# Patient Record
Sex: Male | Born: 1965
Health system: Southern US, Community
[De-identification: ages and names within clinical notes are randomized; demographics above are authoritative.]

## PROBLEM LIST (undated history)

## (undated) DIAGNOSIS — I2510 Atherosclerotic heart disease of native coronary artery without angina pectoris: Secondary | ICD-10-CM

## (undated) DIAGNOSIS — K219 Gastro-esophageal reflux disease without esophagitis: Secondary | ICD-10-CM

## (undated) DIAGNOSIS — G43909 Migraine, unspecified, not intractable, without status migrainosus: Secondary | ICD-10-CM

## (undated) DIAGNOSIS — D496 Neoplasm of unspecified behavior of brain: Secondary | ICD-10-CM

## (undated) DIAGNOSIS — F32A Depression, unspecified: Secondary | ICD-10-CM

## (undated) DIAGNOSIS — E8881 Metabolic syndrome: Secondary | ICD-10-CM

## (undated) DIAGNOSIS — E785 Hyperlipidemia, unspecified: Secondary | ICD-10-CM

## (undated) DIAGNOSIS — I255 Ischemic cardiomyopathy: Secondary | ICD-10-CM

## (undated) DIAGNOSIS — G4730 Sleep apnea, unspecified: Secondary | ICD-10-CM

## (undated) DIAGNOSIS — Z720 Tobacco use: Secondary | ICD-10-CM

## (undated) DIAGNOSIS — R569 Unspecified convulsions: Secondary | ICD-10-CM

## (undated) DIAGNOSIS — F419 Anxiety disorder, unspecified: Secondary | ICD-10-CM

## (undated) DIAGNOSIS — N200 Calculus of kidney: Secondary | ICD-10-CM

## (undated) DIAGNOSIS — C801 Malignant (primary) neoplasm, unspecified: Secondary | ICD-10-CM

## (undated) DIAGNOSIS — F329 Major depressive disorder, single episode, unspecified: Secondary | ICD-10-CM

## (undated) DIAGNOSIS — R7303 Prediabetes: Secondary | ICD-10-CM

## (undated) DIAGNOSIS — M1990 Unspecified osteoarthritis, unspecified site: Secondary | ICD-10-CM

## (undated) HISTORY — DX: Prediabetes: R73.03

## (undated) HISTORY — DX: Unspecified convulsions: R56.9

## (undated) HISTORY — PX: TOOTH EXTRACTION: SUR596

## (undated) HISTORY — PX: KNEE ARTHROSCOPY: SUR90

## (undated) HISTORY — PX: MOHS SURGERY: SUR867

## (undated) HISTORY — PX: WISDOM TOOTH EXTRACTION: SHX21

---

## 2002-11-05 HISTORY — PX: BRAIN SURGERY: SHX531

## 2003-09-24 ENCOUNTER — Ambulatory Visit (HOSPITAL_COMMUNITY): Admission: RE | Admit: 2003-09-24 | Discharge: 2003-09-24 | Payer: Self-pay | Admitting: Neurology

## 2003-10-05 ENCOUNTER — Inpatient Hospital Stay (HOSPITAL_COMMUNITY): Admission: RE | Admit: 2003-10-05 | Discharge: 2003-10-10 | Payer: Self-pay | Admitting: Neurological Surgery

## 2003-10-05 ENCOUNTER — Encounter (INDEPENDENT_AMBULATORY_CARE_PROVIDER_SITE_OTHER): Payer: Self-pay | Admitting: Specialist

## 2003-11-17 ENCOUNTER — Ambulatory Visit (HOSPITAL_COMMUNITY): Admission: RE | Admit: 2003-11-17 | Discharge: 2003-11-17 | Payer: Self-pay | Admitting: Neurological Surgery

## 2004-02-15 ENCOUNTER — Ambulatory Visit (HOSPITAL_COMMUNITY): Admission: RE | Admit: 2004-02-15 | Discharge: 2004-02-15 | Payer: Self-pay | Admitting: Neurological Surgery

## 2004-03-01 ENCOUNTER — Ambulatory Visit (HOSPITAL_COMMUNITY): Admission: RE | Admit: 2004-03-01 | Discharge: 2004-03-01 | Payer: Self-pay | Admitting: Neurology

## 2004-05-29 ENCOUNTER — Ambulatory Visit (HOSPITAL_COMMUNITY): Admission: RE | Admit: 2004-05-29 | Discharge: 2004-05-29 | Payer: Self-pay | Admitting: Neurology

## 2004-06-16 ENCOUNTER — Encounter: Admission: RE | Admit: 2004-06-16 | Discharge: 2004-09-14 | Payer: Self-pay | Admitting: Psychology

## 2004-07-09 ENCOUNTER — Ambulatory Visit (HOSPITAL_COMMUNITY): Admission: RE | Admit: 2004-07-09 | Discharge: 2004-07-09 | Payer: Self-pay | Admitting: Family Medicine

## 2005-03-14 ENCOUNTER — Ambulatory Visit (HOSPITAL_COMMUNITY): Admission: RE | Admit: 2005-03-14 | Discharge: 2005-03-14 | Payer: Self-pay | Admitting: Neurology

## 2005-05-28 ENCOUNTER — Ambulatory Visit (HOSPITAL_BASED_OUTPATIENT_CLINIC_OR_DEPARTMENT_OTHER): Admission: RE | Admit: 2005-05-28 | Discharge: 2005-05-28 | Payer: Self-pay | Admitting: Neurology

## 2005-06-03 ENCOUNTER — Ambulatory Visit: Payer: Self-pay | Admitting: Internal Medicine

## 2005-07-05 ENCOUNTER — Ambulatory Visit: Payer: Self-pay | Admitting: Internal Medicine

## 2005-08-02 ENCOUNTER — Ambulatory Visit: Payer: Self-pay | Admitting: Internal Medicine

## 2005-08-23 ENCOUNTER — Ambulatory Visit (HOSPITAL_COMMUNITY): Admission: RE | Admit: 2005-08-23 | Discharge: 2005-08-23 | Payer: Self-pay | Admitting: Neurology

## 2005-11-22 ENCOUNTER — Ambulatory Visit: Payer: Self-pay | Admitting: Internal Medicine

## 2007-11-06 DIAGNOSIS — G4730 Sleep apnea, unspecified: Secondary | ICD-10-CM

## 2007-11-06 HISTORY — DX: Sleep apnea, unspecified: G47.30

## 2008-04-08 ENCOUNTER — Ambulatory Visit (HOSPITAL_COMMUNITY): Admission: RE | Admit: 2008-04-08 | Discharge: 2008-04-08 | Payer: Self-pay | Admitting: Neurology

## 2008-04-10 ENCOUNTER — Encounter: Admission: RE | Admit: 2008-04-10 | Discharge: 2008-04-10 | Payer: Self-pay | Admitting: Neurology

## 2010-05-09 ENCOUNTER — Encounter: Admission: RE | Admit: 2010-05-09 | Discharge: 2010-05-09 | Payer: Self-pay | Admitting: Neurological Surgery

## 2011-03-20 NOTE — Procedures (Signed)
EEG NUMBER:  [id number].   REFERRING PHYSICIAN:  Rene Kocher, MD   This is a routine EEG on an awake and alert left-handed individual with  a past medical history of a brain tumor removed in 2004.  The patient  started having seizures and recently has had some seizures that seemed  to break through while he takes his medications.  He has a history of  migraine, light auras detected.  The patient has mild shaking episodes  followed by nausea.   PAST MEDICAL HISTORY:  Positive for reflux obstructive sleep apnea,  using CPAP at night.  The surgical scar is noted over the left frontal,  central, and temporal region FC3-T3.  The patient reports that he has a  metal plate in his head.   CURRENT MEDICATIONS:  Tegretol XR, Keppra, Klonopin, Lexapro, Zegerid,  and Zantac.   Hyperventilation and photic stimulation were both performed during this  EEG recording with one channel representing heart rate and rhythm  exclusively.  The patient showed a normal EKG with sinus rhythm  throughout.   At the beginning of the EEG, a posterior dominant background rhythm was  established showing 9 Hz frequencies.  These appeared symmetric over the  posterior hemispheres.  The patient proceeded to hyperventilation during  which no focal epileptiform activity was noted.  Photic stimulation  showed photic entrainment clearly at 7, 9, 11, and 13 Hz.  Higher  frequencies no longer provoked this phenomenon.  Symmetry of the EEG is  disturbed as the patient has at T3 and T5 focal slowing with occasional  sharp wave activities.  This appears to be the region above the breach  artifact from surgery.   CONCLUSION:  This is an abnormal EEG solely because of the noted focal  slowing in an area that shows a previous breach of the skull plate.  No  epileptiform activity and no focal slowing is noted.  A posterior  dominant background rhythm was well in normal range for the patient's  age.  EKG was normal sinus  rhythm.      Melvyn Novas, M.D.  Electronically Signed    ZO:XWRU  D:  04/09/2008 13:17:06  T:  04/10/2008 00:50:44  Job #:  [id number]   cc:   Rene Kocher, M.D.  Fax: [phone]

## 2011-03-23 NOTE — Procedures (Signed)
CLINICAL INFORMATION:  This patient has had previous surgery for removal of  tumor on the left. He has had migraines headaches and aura of metallic taste  in his mouth. Medications are Keppra and Depakote.   TECHNICAL DESCRIPTION:  This EEG was recorded during the awake state and  showed an intermixture rhythms with low voltage fast beta activity seen  generally and some 13 hertz alpha rhythms present. During drowsiness, there  is slowing that is focally present in the left central and temporal areas as  opposed to the right. There is evidence of sharp waves at the T3 electrode  with phase reversals between F7 and T5 at the T3 electrode on several  occasions throughout this EEG. There is no electrographic seizure, and  photic stimulation did not produce a driving response. Hyperventilation  testing did not produce any significant abnormalities. The EKG was a steady  normal sinus rhythm throughout.   IMPRESSION:  This is an abnormal EEG showing intermittent left mid temporal  slowing and focal sharp and spike wave activity in the mid temporal region  compatible with a lowered threshold for seizures.      ZOX:WRUE  D:  03/14/2005 20:58:35  T:  03/15/2005 11:01:33  Job #:  [id number]   cc:   Stefani Dama, M.D.  [address]  [place]  [place] [address]  Fax: [phone]

## 2011-03-23 NOTE — Procedures (Signed)
INDICATIONS:  A 45 year old gentleman with episode of severe disorientation  and headaches.  He had a left temporal craniotomy.   DESCRIPTION OF PROCEDURE:  The tracing was carried out on a 32-channel  digital Cadwell recorder reformatted into 16-channel montages with an EKG.  The patient was awake during the recording.  International 10/20 system lead  placement was used.   Medications included Celexa, Klonopin, and Depakote.   Dominant frequency is a 20 MCV 9 hertz activity that is broadly distributed.  Background activity is a mixture of rare theta and alpha range activity that  is broadly distributed with superimposed frontally predominant beta range  activity.  Background overall is under 20 MCV.  There did not appear to be  any focal slowing in the background nor breach artifact.  There was no  intraictal or epileptiform activity in the form of spikes or sharp waves.  Activating procedures including photostimulation and hyperventilation caused  no significant change in background.  EKG showed regular sinus rhythm with  ventricular response at 72 beats per minute.   IMPRESSION:  In the waking state, this record is normal.    Chrissie Noa H. Sharene Skeans, M.D.   WUJ:WJXB  D:  02/15/2004 16:29:49  T:  02/15/2004 22:57:53  Job #:  [id number]   cc:   Stefani Dama, M.D.  [address]  [place]  [place] [address]  Fax: [phone]

## 2011-03-23 NOTE — Op Note (Signed)
NAME:  James Torres, James Torres NO.:  [id number]   MEDICAL RECORD NO.:  [id number]                   PATIENT TYPE:  INP   LOCATION:  2899                                 FACILITY:  [facility]   PHYSICIAN:  Stefani Dama, M.D.               DATE OF BIRTH:  1965-11-18   DATE OF PROCEDURE:  10/05/2003  DATE OF DISCHARGE:                                 OPERATIVE REPORT   PREOPERATIVE DIAGNOSIS:  Left temporal brain tumor.   POSTOPERATIVE DIAGNOSIS:  Left temporal brain  tumor.   PROCEDURES:  1. Left frontotemporal craniotomy with a gross total resection of brain     tumor.  2. Frozen section diagnosis epidermoid tumor.   SURGEON:  Stefani Dama, M.D.   FIRST ASSISTANT:  Hewitt Shorts, M.D.   ANESTHESIA:  General endotracheal.   INDICATIONS FOR PROCEDURE:  This patient is a 45 year old individual who has  had significant problems with headache and changing vision. He was treated  for headaches for a long period of time and has previously had an MRI done  in 1998, which demonstrated what was described as a left temporal arachnoid  cyst. Because of increasing headaches and change in vision, a repeat  MRI  was performed and this demonstrated that this lesion did not appear cystic  and was highly suggestive of a solid tumor, possibly a low-grade  astrocytoma. He was taken to the operating room to undergo surgical  decompression.   DESCRIPTION OF PROCEDURE:  The patient was brought to the operating room and  placed on the table in the supine position. After the smooth induction of  general endotracheal anesthesia, his head was prepped. The scalp was shaved  totally and the head was placed in a three-pin headrest turned slightly to  the right so as to expose the left frontotemporal region.   A standard flap was drawn on the scalp and the incision was taken down to  the region of the zygoma just anterior to the tragus in a typical question  mark fashion.  The scalp was reflected forward. The temporalis fascia was  split in the middle and partially reflected forward and partially reflected  posteriorly. The underlying bone was skeletonized and the pericranium was  stripped fully.   A bur hole was placed high on the temporalis fascia and this was taken down  through the entirety of the bone and a craniotomy flap was then raised,  based on the lateral wing of the sphenoid by extending articularly  inferiorly in the temporal fossa. The temporal fossa bone was noted to be  particularly  thin in the region of the squamosa.   The flap was elevated. The underlying dura was noted to be perforated in the  region of the temporal fossa  and in this area some obviously tumor-like  tissue which appeared  pearlescent and glistening and was rather fatty in  consistency  became immediately  apparent. Biopsies of this were sent  immediately  and it was suspected that this was consistent with an  epidermoid, and that diagnosis  was confirmed by the pathologist.   The dura was then opened from the opening in the middle of the temporal  fossa overlying the tumor. The dura was then opened along the edge of the  sphenoid wing cephalad toward the frontal horn. It was gently elevated and  retracted  posteriorly, and the underlying contents of the tumor were  identified. Dissection of the tumor was then  obtained in a piecemeal  fashion, removing large portions of the tumor with a suction. During the  gross portion of the dissection, a CUSA ultrasonic aspirator was used,  however, most of the dissection was performed with simple suction and gentle  microdissection.   The operating microscope was draped and brought into the field as the deeper  portions of the tumor were excised. It was noted that  the tumor involved  the entirety of the floor of the temporal fossa. The Sylvian fissure was  displaced with tumor. A thin band of temporal tip was left under the  tumor.  This was felt to be highly degraded and nonfunctional and it was excised  along with veins from the temporal tip which were cauterized and divided.  Further back the incisura was identified. The tumor was extending in and  around this area. It extended below the incisura and appeared  to encase the  third nerve.   Care was taken to gently dissect this from the third nerve; however,  dissection  initially went with great difficulty, as the tumor was markedly  adherent. Other areas were then dissected in usual fashion, the initial area  of dissection was revisited and the tumor was found to be slightly looser  from the surrounding  structures. This enabled with a gentle repetitive  technique  involving different areas at different times dissecting  essentially all the tumor that could be seen in the incisura down below the  tentorium and in along the brain  stem.   The takeoff of the carotid artery was noted to be encased in tumor, and this  was again gently dissected in a piecemeal fashion. The dissection involved  the trunk of the middle cerebral artery as this was also encased in tumor.  The edge of the posterior cerebral was also identified, again with  significant tumor encasing this structure.   In the end, a good decompression of all these major structures was obtained  with some uncertainty. There  was no way of knowing if tumor extended down  significantly below the level of the tentorium, and there was some suspicion  that there may have been some fragments of tumor that remained. There was  some tumor also in the posterior aspect of the posterior  cerebral artery  that could not be safely dissected.   There was concern that  the third nerve may have been contused by the  resection of the tumor, however, it was totally in continuity. In the end a  good dissection of these structures in the ambient cistern was obtained and it was felt that a good decompression of all  these structures was achieved.  The temporal tip was completely cleared and no other  gross fragments or  tumor were left, save for the ones as described possibly existing below the  level of the tentorium and on the posterior extent of the posterior cerebral  artery.   With this, hemostasis in all the areas was obtained. The microscope was  removed from the field. The dura was reapproximated with 4-0 Nurolon  sutures. It should be noted that Dr. Newell Coral helped during the  microdissection, particularly in the areas around the carotid artery, the  posterior cerebral artery and along the edge at the collateral edge of the  brainstem.   The closure was then performed by me by replacing the bone and securing it  with OsteoMed plates and then tacking up the dura securely around the edges,  replacing the temporalis fascia and muscle and securing it in the midline  and to the superior extend of the fascia. The galea was then closed with 2-0  Vicryl in an interrupted fashion and surgical staples were used in the scalp  along with some 4-0 nylon at either end of the incision that  extended  beyond the hairline.   The patient tolerated the procedure well. Blood loss was estimated at 350  mL. He was returned to the recovery room in stable condition.                                               Stefani Dama, M.D.    Merla Riches  D:  10/05/2003  T:  10/05/2003  Job:  [id number]

## 2011-03-23 NOTE — Discharge Summary (Signed)
NAME:  James Torres, James Torres NO.:  [id number]   MEDICAL RECORD NO.:  [id number]                   PATIENT TYPE:  INP   LOCATION:  3028                                 FACILITY:  [facility]   PHYSICIAN:  Stefani Dama, M.D.               DATE OF BIRTH:  Aug 24, 1966   DATE OF ADMISSION:  10/05/2003  DATE OF DISCHARGE:  10/09/2003                                 DISCHARGE SUMMARY   CONDITION ON DISCHARGE:  Improving.   HOSPITAL COURSE:  Patient is a 45 year old individual who has had  significant problems with headache over a long period of time.  He was found  to have a large temporal tumor in the left side of his head.  This was  initially felt to be an arachnoid cyst; however, he had demonstrated growth  over a period of about seven years.  He underwent a craniotomy and a gross  total excision of a large epidermoid tumor was undertaken in this region.  Postoperatively, the patient has done quite well.  He does have diplopia,  which he notes is vertical in nature and worse on right-sided gaze.  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  It is expected that this will  recover in time.  Currently, the patient has mild headache, which is treated  well with some oral analgesics.  He has been ambulatory.  Patient has been  maintained on Depakote and Celexa for an anxiety disorder.  He has had  steroid medication, which is being rapidly tapered.  At this point, the  patient is functioning independently.  His incision is clean and dry, and he  is being discharged from the hospital with a prescription for Darvocet as  needed for pain and a Sterapred dose pack 10 mg over 6 days, to taper his  steroids.  He will be seen in the office in a week's time for suture  removal.   CONDITION ON DISCHARGE:  Improving.   FINAL DIAGNOSIS:  The same as the admitting diagnoses.  He has a left  temporal epidermoid tumor.                                                Stefani Dama, M.D.    Merla Riches  D:  10/08/2003  T:  10/09/2003  Job:  [id number]

## 2011-03-23 NOTE — Procedures (Signed)
HISTORY:  This is a 45 year old patient with a history of seizure type  events and prior left temporal craniotomy.  The patient is being evaluated  for episodes of disorientation and hand shaking.   This is a routine EEG.   MEDICATIONS:  1. Keppra.  2. Depakote.   EEG CLASSIFICATION:  Normal awake.   DESCRIPTION OF RECORDING:  Background rhythm of this recording consists of a  fairly well modulated medium amplitude alpha rhythm of 9 Hz.  There is  reactive eye opening and closure. As the record progresses, the patient  appears to remain in the waking state during the recording.  Photic  stimulation is performed resulting in minimal but bilateral photic time  response.  Hyperventilation is also performed resulting in very minimal  buildup of background rhythm activities without significant slowing seen.  At no time during the recording did there appear to be evidence of actual  spikes or spike wave discharge as evidence of focal slowing.  EKG monitor  shows no evidence of cardiac arrhythmias with a heart rate of 84.   IMPRESSION:  This is an essentially normal EEG recording of waking state.  No evidence of ictal or interictal discharges are seen.    Marlan Palau, M.D.   ZOX:WRUE  D:  05/29/2004 16:00:45  T:  05/29/2004 17:58:53  Job #:  [id number]

## 2011-03-23 NOTE — Procedures (Signed)
NAME:  James Torres, KAESER NO.:  [id number]   MEDICAL RECORD NO.:  [id number]          PATIENT TYPE:  OUT   LOCATION:  SLEEP CENTER                 FACILITY:  [facility]   PHYSICIAN:  Clinton D. Maple Hudson, M.D. DATE OF BIRTH:  12/12/1965   DATE OF STUDY:  05/28/2005                              NOCTURNAL POLYSOMNOGRAM   REFERRING PHYSICIAN:  Rene Kocher, M.D.   DATE OF STUDY:  May 28, 2005   INDICATIONS FOR STUDY:  Hypersomnia with sleep apnea. Epworth sleepiness  score 14/24, BMI 37, weight 275 pounds.   SLEEP ARCHITECTURE:  Total sleep time 405 minutes with sleep efficiency of  83%. Stage I was 9%, stage II was 59%, stages III and IV were 23%, REM was  9% of total sleep time. Sleep latency was 68 minutes. REM latency 327  minutes. Awake after sleep onset 28 minutes. Arousal index increased at 43,  which is markedly increased. He took regularly scheduled nighttime  medications. The list includes Klonopin.   RESPIRATORY DATA:  Split study protocol. Respiratory disturbance index (RDI,  AHI) 133.5 obstructive events per hour before CPAP indicating severe  obstructive sleep apnea/hypopnea syndrome. This included 180 obstructive  apneas and 18 hypopneas before CPAP. All sleep and therefore all events were  supine. RDI 12.6 during REM. CPAP was titrated to 17 CWP, RDI of 2.8 per  hour using a medium full face Respironics mask with heated humidifier.   OXYGEN DATA:  Moderate to loud snoring with oxygen desaturation to a nadir  of 80% before CPAP. After CPAP saturation held 95% to 98% on room air.   CARDIAC DATA:  Normal sinus rhythm.   MOVEMENT/PARASOMNIA:  Infrequent leg jerks without effect on sleep.   IMPRESSION/RECOMMENDATIONS:  1.  Severe obstructive sleep apnea/hypopnea syndrome, RDI 133.5 per hour      with moderately loud snoring and oxygen desaturation to 80%.  2.  Successful CPAP titration to 17 CWP, RDI of 2.8 per hour using medium      full face Respironics  mask with heated humidifier.      Clinton D. Maple Hudson, M.D.  Diplomat    CDY/MEDQ  D:  06/03/2005 13:34:46  T:  06/03/2005 19:27:34  Job:  [id number]

## 2012-01-19 ENCOUNTER — Ambulatory Visit: Payer: Self-pay

## 2012-01-19 ENCOUNTER — Ambulatory Visit: Payer: Self-pay | Admitting: Physician Assistant

## 2012-01-19 VITALS — BP 143/82 | HR 103 | Temp 98.2°F | Resp 20 | Ht 72.0 in | Wt 344.8 lb

## 2012-01-19 DIAGNOSIS — R05 Cough: Secondary | ICD-10-CM

## 2012-01-19 DIAGNOSIS — J449 Chronic obstructive pulmonary disease, unspecified: Secondary | ICD-10-CM

## 2012-01-19 DIAGNOSIS — J40 Bronchitis, not specified as acute or chronic: Secondary | ICD-10-CM

## 2012-01-19 DIAGNOSIS — J209 Acute bronchitis, unspecified: Secondary | ICD-10-CM

## 2012-01-19 LAB — POCT CBC
Granulocyte percent: 56.8 %G (ref 37–80)
HCT, POC: 50.1 % (ref 43.5–53.7)
Hemoglobin: 16.3 g/dL (ref 14.1–18.1)
Lymph, poc: 2.9 (ref 0.6–3.4)
MCH, POC: 30.2 pg (ref 27–31.2)
MCHC: 32.5 g/dL (ref 31.8–35.4)
MCV: 92.9 fL (ref 80–97)
MID (cbc): 0.6 (ref 0–0.9)
MPV: 10.1 fL (ref 0–99.8)
POC Granulocyte: 4.6 (ref 2–6.9)
POC LYMPH PERCENT: 35.3 %L (ref 10–50)
POC MID %: 7.9 %M (ref 0–12)
Platelet Count, POC: 231 10*3/uL (ref 142–424)
RBC: 5.39 M/uL (ref 4.69–6.13)
RDW, POC: 13.3 %
WBC: 8.1 10*3/uL (ref 4.6–10.2)

## 2012-01-19 MED ORDER — HYDROCODONE-HOMATROPINE 5-1.5 MG/5ML PO SYRP: 5.0000 mL | ORAL_SOLUTION | ORAL | Status: AC | PRN

## 2012-01-19 MED ORDER — DOXYCYCLINE HYCLATE 100 MG PO TABS: 100.0000 mg | ORAL_TABLET | Freq: Two times a day (BID) | ORAL | Status: AC

## 2012-01-19 MED ORDER — IPRATROPIUM BROMIDE 0.06 % NA SOLN: 2.0000 | Freq: Four times a day (QID) | NASAL | Status: AC

## 2012-01-19 MED ORDER — ALBUTEROL SULFATE HFA 108 (90 BASE) MCG/ACT IN AERS: 2.0000 | INHALATION_SPRAY | RESPIRATORY_TRACT | Status: DC | PRN

## 2012-01-19 NOTE — Progress Notes (Signed)
  Subjective:    Patient ID: James Torres, male    DOB: 1966/08/23, 46 y.o.   MRN: [id number]  HPI Mr. Onorato is here today with 2 weeks of cough and head congestion.     Review of Systems     Objective:   Physical Exam   Results for orders placed in visit on 01/19/12  POCT CBC      Component Value Range   WBC 8.1  4.6 - 10.2 (K/uL)   Lymph, poc 2.9  0.6 - 3.4    POC LYMPH PERCENT 35.3  10 - 50 (%L)   MID (cbc) 0.6  0 - 0.9    POC MID % 7.9  0 - 12 (%M)   POC Granulocyte 4.6  2 - 6.9    Granulocyte percent 56.8  37 - 80 (%G)   RBC 5.39  4.69 - 6.13 (M/uL)   Hemoglobin 16.3  14.1 - 18.1 (g/dL)   HCT, POC 11.9  14.7 - 53.7 (%)   MCV 92.9  80 - 97 (fL)   MCH, POC 30.2  27 - 31.2 (pg)   MCHC 32.5  31.8 - 35.4 (g/dL)   RDW, POC 82.9     Platelet Count, POC 231  142 - 424 (K/uL)   MPV 10.1  0 - 99.8 (fL)    CXR Heavy markings COPD  [facility] reading (PRIMARY) by  Dr. Hal Hope.      Assessment & Plan:  URI  Bronchitis COPD  Doxy, hycodan, Albuterol. Push fluids.  Watch for worsening sx.  Discussed with Hal Hope

## 2012-03-05 ENCOUNTER — Other Ambulatory Visit: Payer: Self-pay | Admitting: Neurology

## 2012-03-05 DIAGNOSIS — D496 Neoplasm of unspecified behavior of brain: Secondary | ICD-10-CM

## 2012-03-11 ENCOUNTER — Ambulatory Visit
Admission: RE | Admit: 2012-03-11 | Discharge: 2012-03-11 | Disposition: A | Discharge status: Home / Self Care | Payer: Self-pay | Source: Ambulatory Visit | Attending: Neurology | Admitting: Neurology

## 2012-03-11 DIAGNOSIS — D496 Neoplasm of unspecified behavior of brain: Secondary | ICD-10-CM

## 2012-03-11 MED ORDER — GADOBENATE DIMEGLUMINE 529 MG/ML IV SOLN
20.0000 mL | Freq: Once | INTRAVENOUS | Status: AC | PRN
  Administered 2012-03-11: 20 mL via INTRAVENOUS

## 2013-07-19 ENCOUNTER — Emergency Department (HOSPITAL_COMMUNITY): Payer: Medicare Other

## 2013-07-19 ENCOUNTER — Encounter (HOSPITAL_COMMUNITY): Payer: Self-pay | Admitting: Nurse Practitioner

## 2013-07-19 ENCOUNTER — Emergency Department (HOSPITAL_COMMUNITY)
Admission: EM | Admit: 2013-07-19 | Discharge: 2013-07-19 | Disposition: A | Discharge status: Home / Self Care | Payer: Medicare Other | Attending: Emergency Medicine | Admitting: Emergency Medicine

## 2013-07-19 DIAGNOSIS — R059 Cough, unspecified: Secondary | ICD-10-CM | POA: Insufficient documentation

## 2013-07-19 DIAGNOSIS — Z86011 Personal history of benign neoplasm of the brain: Secondary | ICD-10-CM | POA: Insufficient documentation

## 2013-07-19 DIAGNOSIS — F17200 Nicotine dependence, unspecified, uncomplicated: Secondary | ICD-10-CM | POA: Insufficient documentation

## 2013-07-19 DIAGNOSIS — R05 Cough: Secondary | ICD-10-CM | POA: Insufficient documentation

## 2013-07-19 DIAGNOSIS — Z79899 Other long term (current) drug therapy: Secondary | ICD-10-CM | POA: Insufficient documentation

## 2013-07-19 DIAGNOSIS — N200 Calculus of kidney: Secondary | ICD-10-CM | POA: Insufficient documentation

## 2013-07-19 DIAGNOSIS — G40909 Epilepsy, unspecified, not intractable, without status epilepticus: Secondary | ICD-10-CM | POA: Insufficient documentation

## 2013-07-19 DIAGNOSIS — R071 Chest pain on breathing: Secondary | ICD-10-CM | POA: Insufficient documentation

## 2013-07-19 HISTORY — DX: Calculus of kidney: N20.0

## 2013-07-19 HISTORY — DX: Neoplasm of unspecified behavior of brain: D49.6

## 2013-07-19 LAB — COMPREHENSIVE METABOLIC PANEL
ALT: 27 U/L (ref 0–53)
AST: 20 U/L (ref 0–37)
Albumin: 4.5 g/dL (ref 3.5–5.2)
Alkaline Phosphatase: 121 U/L — ABNORMAL HIGH (ref 39–117)
BUN: 14 mg/dL (ref 6–23)
CO2: 24 mEq/L (ref 19–32)
Calcium: 9.7 mg/dL (ref 8.4–10.5)
Chloride: 98 mEq/L (ref 96–112)
Creatinine, Ser: 1.16 mg/dL (ref 0.50–1.35)
GFR calc Af Amer: 84 mL/min — ABNORMAL LOW (ref >90)
GFR calc non Af Amer: 72 mL/min — ABNORMAL LOW (ref >90)
Glucose, Bld: 116 mg/dL — ABNORMAL HIGH (ref 70–99)
Potassium: 3.9 mEq/L (ref 3.5–5.1)
Sodium: 137 mEq/L (ref 135–145)
Total Bilirubin: 0.4 mg/dL (ref 0.3–1.2)
Total Protein: 7.9 g/dL (ref 6.0–8.3)

## 2013-07-19 LAB — CBC WITH DIFFERENTIAL/PLATELET
Basophils Absolute: 0 10*3/uL (ref 0.0–0.1)
Basophils Relative: 0 % (ref 0–1)
Eosinophils Absolute: 0.1 10*3/uL (ref 0.0–0.7)
Eosinophils Relative: 1 % (ref 0–5)
HCT: 45.9 % (ref 39.0–52.0)
Hemoglobin: 16.2 g/dL (ref 13.0–17.0)
Lymphocytes Relative: 15 % (ref 12–46)
Lymphs Abs: 1.3 10*3/uL (ref 0.7–4.0)
MCH: 31.3 pg (ref 26.0–34.0)
MCHC: 35.3 g/dL (ref 30.0–36.0)
MCV: 88.8 fL (ref 78.0–100.0)
Monocytes Absolute: 0.4 10*3/uL (ref 0.1–1.0)
Monocytes Relative: 5 % (ref 3–12)
Neutro Abs: 6.8 10*3/uL (ref 1.7–7.7)
Neutrophils Relative %: 78 % — ABNORMAL HIGH (ref 43–77)
Platelets: 205 10*3/uL (ref 150–400)
RBC: 5.17 MIL/uL (ref 4.22–5.81)
RDW: 14.1 % (ref 11.5–15.5)
WBC: 8.6 10*3/uL (ref 4.0–10.5)

## 2013-07-19 MED ORDER — OXYCODONE-ACETAMINOPHEN 5-325 MG PO TABS
1.0000 | ORAL_TABLET | Freq: Once | ORAL | Status: AC
  Administered 2013-07-19: 1 via ORAL
  Filled 2013-07-19: qty 1

## 2013-07-19 NOTE — ED Provider Notes (Signed)
CSN: [id number]     Arrival date & time 07/19/13  1101 History   First MD Initiated Contact with Patient 07/19/13 1158     Chief Complaint  Patient presents with  . Flank Pain   (Consider location/radiation/quality/duration/timing/severity/associated sxs/prior Treatment) HPI  47 year old male with history of kidney stones, seizure, brain tumor present complaint L flank pain.  Onset yesterday, initially starts in his L testicle and now radiates to lower back.  Pain is constant, and worsen.  Describe as achy, 9/10.  No specific treatment tried.  Nothing makes it better or worse. Pain does reminds him of prior kidney stone pain.   Also reports having chest wall pain, affecting R side of chest, with productive cough. Pain also worsen with eating and drinking.  Has taken his Zegerid medication without improvement. Pain worsening with cough.  Was recently diagnosed with having bronchitis and was treated with zithromax, steroid, and albuterol inhaler.  Sts sxs initially improved. He has finished his antibiotic but sxs has not resolved.  Denies fever, chills, headache, hemoptysis, n/v/d, dysuria, hematuria, testicular swelling, rash, or recent trauma.       Past Medical History  Diagnosis Date  . Seizures   . Kidney stone   . Brain tumor    Past Surgical History  Procedure Laterality Date  . Brain surgery     History reviewed. No pertinent family history. History  Substance Use Topics  . Smoking status: Current Every Day Smoker -- 20.00 packs/day    Types: Cigarettes  . Smokeless tobacco: Not on file  . Alcohol Use: No    Review of Systems  All other systems reviewed and are negative.    Allergies  Review of patient's allergies indicates no known allergies.  Home Medications   Current Outpatient Rx  Name  Route  Sig  Dispense  Refill  . ALPRAZolam (XANAX) 0.5 MG tablet   Oral   Take 0.5 mg by mouth at bedtime as needed.         . carbamazepine (TEGRETOL XR) 400 MG 12 hr  tablet   Oral   Take 400 mg by mouth 2 (two) times daily.         . citalopram (CELEXA) 20 MG tablet   Oral   Take 20 mg by mouth daily.         . clonazePAM (KLONOPIN) 0.5 MG tablet   Oral   Take 0.5 mg by mouth 2 (two) times daily as needed.         . frovatriptan (FROVA) 2.5 MG tablet   Oral   Take 2.5 mg by mouth as needed. If recurs, may repeat after 2 hours. Max of 3 tabs in 24 hours.         [name] HYDROmorphone (DILAUDID) 2 MG tablet   Oral   Take 2 mg by mouth every 4 (four) hours as needed.         Maxwell Caul Bicarbonate (ZEGERID) 20-1100 MG CAPS   Oral   Take 1 capsule by mouth daily before breakfast.         . rizatriptan (MAXALT-MLT) 10 MG disintegrating tablet   Oral   Take 10 mg by mouth as needed. May repeat in 2 hours if needed          BP 191/92  Pulse 93  Temp(Src) 97.9 F (36.6 C) (Oral)  Resp 20  Ht 6' (1.829 m)  Wt 250 lb (113.399 kg)  BMI 33.9 kg/m2  SpO2 96% Physical Exam  Nursing note and vitals reviewed. Constitutional: He is oriented to person, place, and time. He appears well-developed and well-nourished.  Moderately obese, in NAD  HENT:  Head: Atraumatic.  Eyes: Conjunctivae are normal.  Neck: Neck supple.  Cardiovascular: Normal rate and regular rhythm.   Pulmonary/Chest: Effort normal and breath sounds normal. He has no wheezes.  Abdominal: Soft. There is tenderness (mild tenderness to LLQ without guarding or rebound tenderness.  No hernia noted.  ).  Genitourinary:  No tenderness on testicular and scrotal exam.  No hernia noted.    Mild L CVA tenderness.  No overlying skin changes.    Musculoskeletal: He exhibits no edema and no tenderness.  Neurological: He is alert and oriented to person, place, and time.  Skin: No rash noted.  Psychiatric: He has a normal mood and affect.    ED Course  Procedures (including critical care time)  1:03 PM Pt here with pain to R side of chest, worsening with cough.  Sxs  ongoing since last week when he was diagnosed with bronchitis (no CXR performed).  Due to chronicity of sxs, will obtain cxr to r/o pna.  Sxs may be related to GERD as it's bothersome with eating.  Doubt biliary disease.  Doubt cardiac disease  Pt also has L flank pain and L testicular pain.  No evidence to suggest testicular torsion as testicle pain is minimal.  No significant CVA tenderness, but since pt has prior kidney stone, will consider CT to r/o stone or hydronephrosis.  Pt also has elevated BP of 191/92, will repeat VS.    3:59 PM CT shows L side hydroureteronephrosis likely due to a recently passed ureteral calculus.  The finding is consistent with patient's presentation.  He is currently sxs free.  We will cancel UA as pt able to void but denies any dysuria or hematuria.  Pt does have pain medication at home to use as needed.  Will give urology referral as needed.  CXR today shows no evidence of PNA.  Pt to continue with prednisone as previously prescribed.  Return precaution discussed.  Pt stable for discharge.    Labs Review Labs Reviewed  CBC WITH DIFFERENTIAL - Abnormal; Notable for the following:    Neutrophils Relative % 78 (*)    All other components within normal limits  COMPREHENSIVE METABOLIC PANEL - Abnormal; Notable for the following:    Glucose, Bld 116 (*)    Alkaline Phosphatase 121 (*)    GFR calc non Af Amer 72 (*)    GFR calc Af Amer 84 (*)    All other components within normal limits  URINALYSIS, ROUTINE W REFLEX MICROSCOPIC   Imaging Review Ct Abdomen Pelvis Wo Contrast  07/19/2013   CLINICAL DATA:  Left flank pain and groin pain.  EXAM: CT ABDOMEN AND PELVIS WITHOUT CONTRAST  TECHNIQUE: Multidetector CT imaging of the abdomen and pelvis was performed following the standard protocol without intravenous contrast.  COMPARISON:  None  FINDINGS: The lung bases are clear. No pleural effusion. The heart is normal in size. No pericardial effusion.  The unenhanced  appearance of the liver is unremarkable. No focal hepatic lesions or intrahepatic biliary dilatation. The gallbladder appears normal. No common bile duct dilatation. The pancreas is normal. The spleen demonstrates small calcified granulomas. The adrenal glands are normal.  There are small right-sided renal calculi. No definite left renal calculi. There is left-sided hydroureteronephrosis but no obstructing ureteral calculus is identified. I suspect the patient recently passed a calculus  from the left ureter. The bladder is decompressed/ not well distended with mild wall thickening. No definite bladder calculus.  The stomach, duodenum, small bowel and colon are grossly normal without oral contrast. The appendix is normal. No mesenteric or retroperitoneal mass or adenopathy. The aorta is normal in caliber.  Prostate gland and seminal vesicles are unremarkable. No pelvic mass, adenopathy or free pelvic fluid collections. No inguinal mass or hernia.  The bony structures are unremarkable.  IMPRESSION: Left-sided hydroureteronephrosis likely due to a recently passed ureteral calculus.  Right renal calculi.   Electronically Signed   By: Loralie Champagne M.D.   On: 07/19/2013 15:40   Dg Chest 2 View  07/19/2013   CLINICAL DATA:  Left flank pain  EXAM: CHEST  2 VIEW  COMPARISON:  01/21/2012.  FINDINGS: The heart size and mediastinal contours are within normal limits. Both lungs are clear. Mild degenerative changes thoracic spine again noted. Stable mild elevation of the right hemidiaphragm.  IMPRESSION: No active cardiopulmonary disease.   Electronically Signed   By: Natasha Mead   On: 07/19/2013 13:51    MDM   1. Kidney stone on left side    BP 132/67  Pulse 81  Temp(Src) 97.9 F (36.6 C) (Oral)  Resp 18  Ht 6' (1.829 m)  Wt 250 lb (113.399 kg)  BMI 33.9 kg/m2  SpO2 95%  I have reviewed nursing notes and vital signs. I personally reviewed the imaging tests through PACS system  I reviewed available  ER/hospitalization records thought the EMR     Fayrene Helper, [place] 07/19/13 1604

## 2013-07-19 NOTE — ED Notes (Signed)
Pt c/o L groin pain yesterday now radiating into L flank. "feels like when i had a kidney stone." pt reports nausea and difficulty voiding.

## 2013-07-19 NOTE — ED Notes (Signed)
Pt transported to xray 

## 2013-07-19 NOTE — ED Notes (Signed)
Patient transported to X-ray 

## 2013-07-19 NOTE — Discharge Instructions (Signed)
Your CT scan shows evidence of a recently passed kidney stone affecting your left side.  Please discussed with your neurologist about the risk of kidney stone formation while taking topamax.  You may also follow up with urologist for further care.  Take home pain medication as needed.  Return if your symptoms worsen or if you have other concerns.    Kidney Stones Kidney stones (ureteral lithiasis) are deposits that form inside your kidneys. The intense pain is caused by the stone moving through the urinary tract. When the stone moves, the ureter goes into spasm around the stone. The stone is usually passed in the urine.  CAUSES   A disorder that makes certain neck glands produce too much parathyroid hormone (primary hyperparathyroidism).  A buildup of uric acid crystals.  Narrowing (stricture) of the ureter.  A kidney obstruction present at birth (congenital obstruction).  Previous surgery on the kidney or ureters.  Numerous kidney infections. SYMPTOMS   Feeling sick to your stomach (nauseous).  Throwing up (vomiting).  Blood in the urine (hematuria).  Pain that usually spreads (radiates) to the groin.  Frequency or urgency of urination. DIAGNOSIS   Taking a history and physical exam.  Blood or urine tests.  Computerized X-ray scan (CT scan).  Occasionally, an examination of the inside of the urinary bladder (cystoscopy) is performed. TREATMENT   Observation.  Increasing your fluid intake.  Surgery may be needed if you have severe pain or persistent obstruction. The size, location, and chemical composition are all important variables that will determine the proper choice of action for you. Talk to your caregiver to better understand your situation so that you will minimize the risk of injury to yourself and your kidney.  HOME CARE INSTRUCTIONS   Drink enough water and fluids to keep your urine clear or pale yellow.  Strain all urine through the provided strainer. Keep  all particulate matter and stones for your caregiver to see. The stone causing the pain may be as small as a grain of salt. It is very important to use the strainer each and every time you pass your urine. The collection of your stone will allow your caregiver to analyze it and verify that a stone has actually passed.  Only take over-the-counter or prescription medicines for pain, discomfort, or fever as directed by your caregiver.  Make a follow-up appointment with your caregiver as directed.  Get follow-up X-rays if required. The absence of pain does not always mean that the stone has passed. It may have only stopped moving. If the urine remains completely obstructed, it can cause loss of kidney function or even complete destruction of the kidney. It is your responsibility to make sure X-rays and follow-ups are completed. Ultrasounds of the kidney can show blockages and the status of the kidney. Ultrasounds are not associated with any radiation and can be performed easily in a matter of minutes. SEEK IMMEDIATE MEDICAL CARE IF:   Pain cannot be controlled with the prescribed medicine.  You have a fever.  The severity or intensity of pain increases over 18 hours and is not relieved by pain medicine.  You develop a new onset of abdominal pain.  You feel faint or pass out. MAKE SURE YOU:   Understand these instructions.  Will watch your condition.  Will get help right away if you are not doing well or get worse. Document Released: 10/22/2005 Document Revised: 01/14/2012 Document Reviewed: 02/17/2010 [facility] Patient Information 2014 [place], [place].

## 2013-07-19 NOTE — ED Provider Notes (Signed)
Medical screening examination/treatment/procedure(s) were performed by non-physician practitioner and as supervising physician I was immediately available for consultation/collaboration.   Celene Kras, MD 07/19/13 [phone]

## 2014-06-23 ENCOUNTER — Other Ambulatory Visit: Payer: Self-pay | Admitting: Orthopedic Surgery

## 2014-06-24 ENCOUNTER — Encounter (HOSPITAL_COMMUNITY)
Admission: RE | Admit: 2014-06-24 | Discharge: 2014-06-24 | Disposition: A | Discharge status: Home / Self Care | Payer: Medicare Other | Source: Ambulatory Visit | Attending: Orthopedic Surgery | Admitting: Orthopedic Surgery

## 2014-06-24 ENCOUNTER — Encounter (HOSPITAL_COMMUNITY): Payer: Self-pay

## 2014-06-24 DIAGNOSIS — G5620 Lesion of ulnar nerve, unspecified upper limb: Secondary | ICD-10-CM | POA: Diagnosis present

## 2014-06-24 DIAGNOSIS — G43909 Migraine, unspecified, not intractable, without status migrainosus: Secondary | ICD-10-CM | POA: Diagnosis not present

## 2014-06-24 DIAGNOSIS — G4730 Sleep apnea, unspecified: Secondary | ICD-10-CM | POA: Diagnosis not present

## 2014-06-24 DIAGNOSIS — F3289 Other specified depressive episodes: Secondary | ICD-10-CM | POA: Diagnosis not present

## 2014-06-24 DIAGNOSIS — F329 Major depressive disorder, single episode, unspecified: Secondary | ICD-10-CM | POA: Diagnosis not present

## 2014-06-24 DIAGNOSIS — K219 Gastro-esophageal reflux disease without esophagitis: Secondary | ICD-10-CM | POA: Diagnosis not present

## 2014-06-24 DIAGNOSIS — F411 Generalized anxiety disorder: Secondary | ICD-10-CM | POA: Diagnosis not present

## 2014-06-24 DIAGNOSIS — R569 Unspecified convulsions: Secondary | ICD-10-CM | POA: Diagnosis not present

## 2014-06-24 HISTORY — DX: Anxiety disorder, unspecified: F41.9

## 2014-06-24 HISTORY — DX: Gastro-esophageal reflux disease without esophagitis: K21.9

## 2014-06-24 HISTORY — DX: Sleep apnea, unspecified: G47.30

## 2014-06-24 HISTORY — DX: Major depressive disorder, single episode, unspecified: F32.9

## 2014-06-24 HISTORY — DX: Depression, unspecified: F32.A

## 2014-06-24 LAB — BASIC METABOLIC PANEL
ANION GAP: 10 (ref 5–15)
BUN: 13 mg/dL (ref 6–23)
CHLORIDE: 101 meq/L (ref 96–112)
CO2: 28 mEq/L (ref 19–32)
Calcium: 9.4 mg/dL (ref 8.4–10.5)
Creatinine, Ser: 0.99 mg/dL (ref 0.50–1.35)
GFR calc non Af Amer: >90 mL/min (ref >90)
Glucose, Bld: 102 mg/dL — ABNORMAL HIGH (ref 70–99)
POTASSIUM: 4.8 meq/L (ref 3.7–5.3)
Sodium: 139 mEq/L (ref 137–147)

## 2014-06-24 LAB — CBC
HCT: 46.6 % (ref 39.0–52.0)
HEMOGLOBIN: 16 g/dL (ref 13.0–17.0)
MCH: 31.1 pg (ref 26.0–34.0)
MCHC: 34.3 g/dL (ref 30.0–36.0)
MCV: 90.5 fL (ref 78.0–100.0)
Platelets: 173 10*3/uL (ref 150–400)
RBC: 5.15 MIL/uL (ref 4.22–5.81)
RDW: 14.1 % (ref 11.5–15.5)
WBC: 4.7 10*3/uL (ref 4.0–10.5)

## 2014-06-24 MED ORDER — DEXTROSE 5 % IV SOLN
3.0000 g | INTRAVENOUS | Status: AC
  Administered 2014-06-25: 3 g via INTRAVENOUS
  Filled 2014-06-24: qty 3000

## 2014-06-24 NOTE — Pre-Procedure Instructions (Addendum)
James Torres  06/24/2014   Your procedure is scheduled on:  Friday, August 21.  Report to [facility] Admitting at 5:30 AM.  Call this number if you have problems the morning of surgery: [phone]   Remember:   Do not eat food or drink liquids after midnight.   Take these medicines the morning of surgery with A SIP OF WATER: carbamazepine (TEGRETOL XR), clonazePAM (KLONOPIN), Omeprazole-Sodium Bicarbonate(ZEGERID), citalopram (CELEXA).              Take if needed:  isometheptene-acetaminophen-dichloralphenazone (MIDRIN),  Pain medication.       Do not wear jewelry, make-up or nail polish.  Do not wear lotions, powders, or perfumes.                 Men may shave head and neck.              Do not bring valuables to the hospital.    [facility] is not responsioble for any belongings or valuables.               Contacts, dentures or bridgework may not be worn into surgery.  Leave suitcase in the car. After surgery it may be brought to your room.  For patients admitted to the hospital, discharge time is determined by your treatment team.               Patients discharged the day of surgery will not be allowed to drive home.  Name and phone number of your driver: -   Special Instructions: Review  [facility] - Preparing For Surgery.   Please read over the following fact sheets that you were given: Pain Booklet, Coughing and Deep Breathing and Surgical Site Infection Prevention

## 2014-06-24 NOTE — H&P (Signed)
James Torres is an 48 y.o. male.   CC / Reason for Visit: Left hand problems HPI: This patient returns for reevaluation.  He continues to have symptoms, yesterday was quite a bad day.  The pain feels like a burning pain sometimes radiates from the elbow and yesterday and felt as if someone was showing a tack into the ulnar border of his hand.  He is taking Neurontin 3 times a day.  He desires to proceed surgically  Presenting history follows: This patient is a 48 year old disabled male who presents for evaluation of his left upper extremity.  He reports that he awoke suddenly about 3 weeks ago having numbness and tingling in the small finger which lasted all day.  He continues to have it intermittently.   Past Medical History  Diagnosis Date  . Brain tumor   . Sleep apnea 2009  . Anxiety   . Depression   . Kidney stone   . GERD (gastroesophageal reflux disease)   . Seizures     Has auras and metalic taste in mouth.  Last one 06/2014- last a few seconds, has one every couple weeks, Dr Mare Loan is neurologist and is aware.   [name] Headache(784.0)     migraines    Past Surgical History  Procedure Laterality Date  . Brain surgery  2004    Crainotomy for tumor  . Knee arthroscopy Right 2005 approx    cartliage  . Wisdom tooth extraction      No family history on file. Social History:  reports that he has been smoking Cigarettes.  He has a 20 pack-year smoking history. He does not have any smokeless tobacco history on file. He reports that he does not drink alcohol or use illicit drugs.  Allergies: No Known Allergies  No prescriptions prior to admission    Results for orders placed during the hospital encounter of 06/25/14 (from the past 48 hour(s))  BASIC METABOLIC PANEL     Status: Abnormal   Collection Time    06/24/14  9:48 AM      Result Value Ref Range   Sodium 139  137 - 147 mEq/L   Potassium 4.8  3.7 - 5.3 mEq/L   Chloride 101  96 - 112 mEq/L   CO2 28  19 - 32 mEq/L    Glucose, Bld 102 (*) 70 - 99 mg/dL   BUN 13  6 - 23 mg/dL   Creatinine, Ser 0.99  0.50 - 1.35 mg/dL   Calcium 9.4  8.4 - 10.5 mg/dL   GFR calc non Af Amer >90  >90 mL/min   GFR calc Af Amer >90  >90 mL/min   Comment: (NOTE)     The eGFR has been calculated using the CKD EPI equation.     This calculation has not been validated in all clinical situations.     eGFR's persistently <90 mL/min signify possible Chronic Kidney     Disease.   Anion gap 10  5 - 15  CBC     Status: None   Collection Time    06/24/14  9:48 AM      Result Value Ref Range   WBC 4.7  4.0 - 10.5 K/uL   RBC 5.15  4.22 - 5.81 MIL/uL   Hemoglobin 16.0  13.0 - 17.0 g/dL   HCT 46.6  39.0 - 52.0 %   MCV 90.5  78.0 - 100.0 fL   MCH 31.1  26.0 - 34.0 pg   MCHC  34.3  30.0 - 36.0 g/dL   RDW 14.1  11.5 - 15.5 %   Platelets 173  150 - 400 K/uL   No results found.  Review of Systems  All other systems reviewed and are negative.   There were no vitals taken for this visit. Physical Exam  Constitutional:  WD, WN, NAD HEENT:  NCAT, EOMI Neuro/Psych:  Alert & oriented to person, place, and time; appropriate mood & affect Lymphatic: No generalized UE edema or lymphadenopathy Extremities / MSK:  Both UE are normal with respect to appearance, ranges of motion, joint stability, muscle strength/tone, sensation, & perfusion except as otherwise noted:  Left upper extremity grossly normal in appearance.  There is a Tinel sign over the ulnar nerve at the cubital tunnel.  He has altered sensibility in the ulnar nerve distribution, reasonably good strength with interosseus and FDP #4 and 5.  Monofilament testing 2.83 across all digits.  Labs / X-rays:  No radiographic studies obtained today.  Assessment: Left ulnar neuropathy at the elbow  Plan: At this point, he would like to proceed surgically.  I discussed with him a decompression in situ, converting to a subcutaneous anterior transposition if the nerve is unstable  intraoperatively.    The details of the operative procedure were discussed with the patient.  Questions were invited and answered.  In addition to the goal of the procedure, the risks of the procedure to include but not limited to bleeding; infection; damage to the nerves or blood vessels that could result in bleeding, numbness, weakness, chronic pain, and the need for additional procedures; stiffness; the need for revision surgery; and anesthetic risks, the worst of which is death, were reviewed.  No specific outcome was guaranteed or implied.  Informed consent was obtained.    Yeslin Delio A. 06/24/2014, 4:28 PM

## 2014-06-25 ENCOUNTER — Ambulatory Visit (HOSPITAL_COMMUNITY): Payer: Medicare Other | Admitting: Anesthesiology

## 2014-06-25 ENCOUNTER — Encounter (HOSPITAL_COMMUNITY): Payer: Medicare Other | Admitting: Anesthesiology

## 2014-06-25 ENCOUNTER — Encounter (HOSPITAL_COMMUNITY)
Admission: RE | Disposition: A | Discharge status: Home / Self Care | Payer: Self-pay | Source: Ambulatory Visit | Attending: Orthopedic Surgery

## 2014-06-25 ENCOUNTER — Encounter (HOSPITAL_COMMUNITY): Payer: Self-pay | Admitting: *Deleted

## 2014-06-25 ENCOUNTER — Ambulatory Visit (HOSPITAL_COMMUNITY)
Admission: RE | Admit: 2014-06-25 | Discharge: 2014-06-25 | Disposition: A | Discharge status: Home / Self Care | Payer: Medicare Other | Source: Ambulatory Visit | Attending: Orthopedic Surgery | Admitting: Orthopedic Surgery

## 2014-06-25 DIAGNOSIS — G5620 Lesion of ulnar nerve, unspecified upper limb: Secondary | ICD-10-CM | POA: Diagnosis not present

## 2014-06-25 DIAGNOSIS — G4730 Sleep apnea, unspecified: Secondary | ICD-10-CM | POA: Diagnosis not present

## 2014-06-25 DIAGNOSIS — R569 Unspecified convulsions: Secondary | ICD-10-CM | POA: Insufficient documentation

## 2014-06-25 DIAGNOSIS — F329 Major depressive disorder, single episode, unspecified: Secondary | ICD-10-CM | POA: Diagnosis not present

## 2014-06-25 DIAGNOSIS — G43909 Migraine, unspecified, not intractable, without status migrainosus: Secondary | ICD-10-CM | POA: Insufficient documentation

## 2014-06-25 DIAGNOSIS — F3289 Other specified depressive episodes: Secondary | ICD-10-CM | POA: Insufficient documentation

## 2014-06-25 DIAGNOSIS — K219 Gastro-esophageal reflux disease without esophagitis: Secondary | ICD-10-CM | POA: Insufficient documentation

## 2014-06-25 DIAGNOSIS — F411 Generalized anxiety disorder: Secondary | ICD-10-CM | POA: Diagnosis not present

## 2014-06-25 HISTORY — PX: ULNAR NERVE TRANSPOSITION: SHX2595

## 2014-06-25 SURGERY — ULNAR NERVE DECOMPRESSION/TRANSPOSITION
Anesthesia: General | Laterality: Left

## 2014-06-25 MED ORDER — MIDAZOLAM HCL 2 MG/2ML IJ SOLN
INTRAMUSCULAR | Status: AC
  Filled 2014-06-25: qty 2

## 2014-06-25 MED ORDER — PROPOFOL 10 MG/ML IV BOLUS
INTRAVENOUS | Status: AC
  Filled 2014-06-25: qty 20

## 2014-06-25 MED ORDER — FENTANYL CITRATE 0.05 MG/ML IJ SOLN
INTRAMUSCULAR | Status: AC
  Filled 2014-06-25: qty 5

## 2014-06-25 MED ORDER — PHENYLEPHRINE HCL 10 MG/ML IJ SOLN
INTRAMUSCULAR | Status: DC | PRN
  Administered 2014-06-25 (×2): 80 ug via INTRAVENOUS

## 2014-06-25 MED ORDER — ONDANSETRON HCL 4 MG/2ML IJ SOLN
INTRAMUSCULAR | Status: DC | PRN
  Administered 2014-06-25: 4 mg via INTRAVENOUS

## 2014-06-25 MED ORDER — OXYCODONE HCL 5 MG PO TABS
5.0000 mg | ORAL_TABLET | Freq: Once | ORAL | Status: AC | PRN
  Administered 2014-06-25: 5 mg via ORAL

## 2014-06-25 MED ORDER — OXYCODONE HCL 5 MG PO TABS
ORAL_TABLET | ORAL | Status: AC
  Filled 2014-06-25: qty 1

## 2014-06-25 MED ORDER — FENTANYL CITRATE 0.05 MG/ML IJ SOLN
INTRAMUSCULAR | Status: DC | PRN
  Administered 2014-06-25: 100 ug via INTRAVENOUS
  Administered 2014-06-25 (×2): 50 ug via INTRAVENOUS

## 2014-06-25 MED ORDER — HYDROMORPHONE HCL PF 1 MG/ML IJ SOLN
INTRAMUSCULAR | Status: AC
  Filled 2014-06-25: qty 1

## 2014-06-25 MED ORDER — MIDAZOLAM HCL 5 MG/5ML IJ SOLN
INTRAMUSCULAR | Status: DC | PRN
  Administered 2014-06-25: 2 mg via INTRAVENOUS

## 2014-06-25 MED ORDER — BUPIVACAINE HCL (PF) 0.25 % IJ SOLN
INTRAMUSCULAR | Status: AC
  Filled 2014-06-25: qty 30

## 2014-06-25 MED ORDER — LACTATED RINGERS IV SOLN
INTRAVENOUS | Status: DC | PRN
  Administered 2014-06-25: 08:00:00 via INTRAVENOUS

## 2014-06-25 MED ORDER — GLYCOPYRROLATE 0.2 MG/ML IJ SOLN
INTRAMUSCULAR | Status: AC
  Filled 2014-06-25: qty 2

## 2014-06-25 MED ORDER — HYDROMORPHONE HCL PF 1 MG/ML IJ SOLN
0.2500 mg | INTRAMUSCULAR | Status: DC | PRN
  Administered 2014-06-25 (×4): 0.5 mg via INTRAVENOUS

## 2014-06-25 MED ORDER — PROMETHAZINE HCL 25 MG/ML IJ SOLN: 6.2500 mg | INTRAMUSCULAR | Status: DC | PRN

## 2014-06-25 MED ORDER — LIDOCAINE HCL (CARDIAC) 20 MG/ML IV SOLN
INTRAVENOUS | Status: AC
  Filled 2014-06-25: qty 5

## 2014-06-25 MED ORDER — BUPIVACAINE-EPINEPHRINE 0.5% -1:200000 IJ SOLN
INTRAMUSCULAR | Status: DC | PRN
  Administered 2014-06-25 (×2): 10 mL

## 2014-06-25 MED ORDER — 0.9 % SODIUM CHLORIDE (POUR BTL) OPTIME
TOPICAL | Status: DC | PRN
  Administered 2014-06-25: 1000 mL

## 2014-06-25 MED ORDER — ROCURONIUM BROMIDE 100 MG/10ML IV SOLN
INTRAVENOUS | Status: DC | PRN
  Administered 2014-06-25: 50 mg via INTRAVENOUS

## 2014-06-25 MED ORDER — BUPIVACAINE-EPINEPHRINE (PF) 0.5% -1:200000 IJ SOLN
INTRAMUSCULAR | Status: AC
  Filled 2014-06-25: qty 30

## 2014-06-25 MED ORDER — GLYCOPYRROLATE 0.2 MG/ML IJ SOLN
INTRAMUSCULAR | Status: DC | PRN
  Administered 2014-06-25: 0.4 mg via INTRAVENOUS

## 2014-06-25 MED ORDER — ONDANSETRON HCL 4 MG/2ML IJ SOLN
INTRAMUSCULAR | Status: AC
  Filled 2014-06-25: qty 2

## 2014-06-25 MED ORDER — LIDOCAINE HCL (CARDIAC) 20 MG/ML IV SOLN
INTRAVENOUS | Status: DC | PRN
  Administered 2014-06-25: 50 mg via INTRAVENOUS

## 2014-06-25 MED ORDER — OXYCODONE HCL 5 MG/5ML PO SOLN: 5.0000 mg | Freq: Once | ORAL | Status: AC | PRN

## 2014-06-25 MED ORDER — PROPOFOL 10 MG/ML IV BOLUS
INTRAVENOUS | Status: DC | PRN
  Administered 2014-06-25: 120 mg via INTRAVENOUS

## 2014-06-25 MED ORDER — OXYCODONE-ACETAMINOPHEN 5-325 MG PO TABS: 1.0000 | ORAL_TABLET | ORAL | Status: DC | PRN

## 2014-06-25 MED ORDER — NEOSTIGMINE METHYLSULFATE 10 MG/10ML IV SOLN
INTRAVENOUS | Status: AC
  Filled 2014-06-25: qty 1

## 2014-06-25 MED ORDER — LIDOCAINE HCL (PF) 1 % IJ SOLN
INTRAMUSCULAR | Status: AC
Start: 2014-06-25 — End: 2014-06-25
  Filled 2014-06-25: qty 30

## 2014-06-25 MED ORDER — ROCURONIUM BROMIDE 50 MG/5ML IV SOLN
INTRAVENOUS | Status: AC
  Filled 2014-06-25: qty 1

## 2014-06-25 MED ORDER — NEOSTIGMINE METHYLSULFATE 10 MG/10ML IV SOLN
INTRAVENOUS | Status: DC | PRN
  Administered 2014-06-25: 3 mg via INTRAVENOUS

## 2014-06-25 SURGICAL SUPPLY — 45 items
BANDAGE COBAN STERILE 2 (GAUZE/BANDAGES/DRESSINGS) ×1 IMPLANT
BLADE MINI RND TIP GREEN BEAV (BLADE) ×1 IMPLANT
BLADE SURG 15 STRL LF DISP TIS (BLADE) ×1 IMPLANT
BLADE SURG 15 STRL SS (BLADE) ×2
BNDG CMPR 9X4 STRL LF SNTH (GAUZE/BANDAGES/DRESSINGS) ×1
BNDG COHESIVE 4X5 TAN STRL (GAUZE/BANDAGES/DRESSINGS) ×2 IMPLANT
BNDG ESMARK 4X9 LF (GAUZE/BANDAGES/DRESSINGS) ×2 IMPLANT
BNDG GAUZE ELAST 4 BULKY (GAUZE/BANDAGES/DRESSINGS) ×4 IMPLANT
BRUSH SCRUB EZ PLAIN DRY (MISCELLANEOUS) IMPLANT
CANISTER SUCTION 2500CC (MISCELLANEOUS) ×2 IMPLANT
CHLORAPREP W/TINT 26ML (MISCELLANEOUS) ×2 IMPLANT
CORDS BIPOLAR (ELECTRODE) ×2 IMPLANT
COVER TABLE BACK 60X90 (DRAPES) ×1 IMPLANT
CUFF TOURNIQUET SINGLE 18IN (TOURNIQUET CUFF) IMPLANT
CUFF TOURNIQUET SINGLE 24IN (TOURNIQUET CUFF) ×1 IMPLANT
DRAPE C-ARM 42X72 X-RAY (DRAPES) ×1 IMPLANT
DRAPE SURG 17X23 STRL (DRAPES) ×2 IMPLANT
DRSG ADAPTIC 3X8 NADH LF (GAUZE/BANDAGES/DRESSINGS) ×2 IMPLANT
DRSG EMULSION OIL 3X3 NADH (GAUZE/BANDAGES/DRESSINGS) IMPLANT
GAUZE SPONGE 4X4 12PLY STRL (GAUZE/BANDAGES/DRESSINGS) ×2 IMPLANT
GLOVE BIO SURGEON STRL SZ7.5 (GLOVE) ×2 IMPLANT
GLOVE BIOGEL PI IND STRL 7.0 (GLOVE) IMPLANT
GLOVE BIOGEL PI IND STRL 8 (GLOVE) ×1 IMPLANT
GLOVE BIOGEL PI INDICATOR 7.0 (GLOVE) ×1
GLOVE BIOGEL PI INDICATOR 8 (GLOVE) ×1
GOWN STRL REUS W/ TWL XL LVL3 (GOWN DISPOSABLE) ×1 IMPLANT
GOWN STRL REUS W/TWL XL LVL3 (GOWN DISPOSABLE) ×4
KIT BASIN OR (CUSTOM PROCEDURE TRAY) ×2 IMPLANT
NDL HYPO 25X1 1.5 SAFETY (NEEDLE) IMPLANT
NEEDLE HYPO 25X1 1.5 SAFETY (NEEDLE) ×2 IMPLANT
NS IRRIG 1000ML POUR BTL (IV SOLUTION) ×2 IMPLANT
PACK ORTHO EXTREMITY (CUSTOM PROCEDURE TRAY) ×2 IMPLANT
PAD CAST 4YDX4 CTTN HI CHSV (CAST SUPPLIES) ×1 IMPLANT
PADDING CAST ABS 4INX4YD NS (CAST SUPPLIES)
PADDING CAST ABS COTTON 4X4 ST (CAST SUPPLIES) IMPLANT
PADDING CAST COTTON 4X4 STRL (CAST SUPPLIES) ×4
PENCIL BUTTON HOLSTER BLD 10FT (ELECTRODE) IMPLANT
RUBBERBAND STERILE (MISCELLANEOUS) IMPLANT
SLING ARM FOAM STRAP XLG (SOFTGOODS) ×1 IMPLANT
SUT VIC AB 2-0 CT3 27 (SUTURE) ×2 IMPLANT
SUT VICRYL 4-0 PS2 18IN ABS (SUTURE) IMPLANT
SUT VICRYL RAPIDE 4/0 PS 2 (SUTURE) ×2 IMPLANT
SYRINGE 10CC LL (SYRINGE) IMPLANT
TOWEL OR 17X24 6PK STRL BLUE (TOWEL DISPOSABLE) ×2 IMPLANT
UNDERPAD 30X30 INCONTINENT (UNDERPADS AND DIAPERS) ×2 IMPLANT

## 2014-06-25 NOTE — Anesthesia Postprocedure Evaluation (Signed)
  Anesthesia Post-op Note  Patient: James Torres  Procedure(s) Performed: Procedure(s): LEFT ULNAR NEUROPLASTY AT ELBOW (Left)  Patient Location: PACU  Anesthesia Type:General  Level of Consciousness: awake and alert   Airway and Oxygen Therapy: Patient Spontanous Breathing  Post-op Pain: mild  Post-op Assessment: Post-op Vital signs reviewed  Post-op Vital Signs: stable  Last Vitals:  Filed Vitals:   06/25/14 1006  BP: 137/66  Pulse: 77  Temp:   Resp: 10    Complications: No apparent anesthesia complications

## 2014-06-25 NOTE — Op Note (Signed)
06/25/2014  7:33 AM  PATIENT:  James Torres  48 y.o. male  PRE-OPERATIVE DIAGNOSIS:  Left ulnar neuropathy at the elbow  POST-OPERATIVE DIAGNOSIS:  Same  PROCEDURE:  Left ulnar neuroplasty at the elbow-subcutaneous anterior transposition  SURGEON: Rayvon Char. Grandville Silos, MD  PHYSICIAN ASSISTANT: None  ANESTHESIA:  general  SPECIMENS:  None  DRAINS:   None  PREOPERATIVE INDICATIONS:  [name] is a  48 y.o. male with NCS-confirmed ulnar neuropathy at the elbow, persisting despite non-op mgmnt  The risks benefits and alternatives were discussed with the patient preoperatively including but not limited to the risks of infection, bleeding, nerve injury, cardiopulmonary complications, the need for revision surgery, among others, and the patient verbalized understanding and consented to proceed.  OPERATIVE IMPLANTS: none  OPERATIVE PROCEDURE:  After receiving prophylactic antibiotics, the patient was escorted to the operative theatre and placed in a supine position. General anesthesia was administered.  A surgical "time-out" was performed during which the planned procedure, proposed operative site, and the correct patient identity were compared to the operative consent and agreement confirmed by the circulating nurse according to current facility policy.  The exposed skin was prepped with Chloraprep and draped in the usual sterile fashion.  Following application of a sterile tourniquet to the operative extremity, the limb was exsanguinated with an Esmarch bandage and the tourniquet inflated to approximately 153mmHg higher than systolic BP.  A curvilinear incision was marked and made sharply over the course of the ulnar nerve at the level of the elbow. Subcutaneous tissues were dissected with blunt spreading dissection with care to protect and preserve branches of the medial antebrachial cutaneous nerve is encountered. Incision actually stayed proximal enough that those branches were not  encountered. The ulnar nerve was identified. The triceps was fairly full to very distal point, and tended to push the nerve anteriorly. The nerve was decompressed 10-12 cm proximal to the cubital tunnel as well as down into the FCU was split in both superficial and deep fascia. As the elbow was ranged, the nerve moved anteriorly enough onto the prominence the medial epicondyles uncomfortable leaving it in situ. For this reason a subcutaneous transposition was performed. The medial intermuscular septum was excised distally, subcutaneous pocket created, and the nerve was dissected free from its bed so that it could be transposed without tension or kinking. Portion of the fascia off of the flexor pronator mass was elevated and used to create a sling to prevent posterior subluxation of the nerve. The sling was secured with 2-0 Vicryl to     superficial fascia above it. Satisfied with the course of the nerve, the flap to prevent subluxation, with no kinking or tightness, tourniquet was released. The wound is copiously irrigated and the skin was closed with 2-0 Vicryl deep dermal buried sutures followed by running 4-0 Vicryl Rapide horizontal mattress suture. A bulky dressing was applied and 20 mL so of half percent Marcaine with epinephrine were instilled in and around the skin edges to help with postoperative pain control. He was awakened and taken to recovery in stable condition, breathing spontaneously        DISPOSITION: He will be discharged home today with typical instructions, returning in 10-15 days for reevaluation.

## 2014-06-25 NOTE — Anesthesia Procedure Notes (Signed)
Procedure Name: Intubation Performed by: Gwynneth Aliment Pre-anesthesia Checklist: Patient identified, Emergency Drugs available, Suction available, Patient being monitored and Timeout performed Patient Re-evaluated:Patient Re-evaluated prior to inductionOxygen Delivery Method: Circle system utilized Preoxygenation: Pre-oxygenation with 100% oxygen Intubation Type: IV induction Ventilation: Mask ventilation without difficulty Grade View: Grade I Tube type: Oral Tube size: 8.0 mm Number of attempts: 1 Airway Equipment and Method: Video-laryngoscopy and Stylet Placement Confirmation: breath sounds checked- equal and bilateral,  positive ETCO2 and ETT inserted through vocal cords under direct vision Secured at: 23 cm Tube secured with: Tape Dental Injury: Teeth and Oropharynx as per pre-operative assessment  Difficulty Due To: Difficulty was anticipated Comments: glidescope intubation d/t body habitus

## 2014-06-25 NOTE — Anesthesia Preprocedure Evaluation (Addendum)
Anesthesia Evaluation  Patient identified by MRN, date of birth, ID band Patient awake    Airway Mallampati: II  Neck ROM: Full    Dental  (+) Teeth Intact   Pulmonary sleep apnea , Current Smoker,  breath sounds clear to auscultation        Cardiovascular Rhythm:Regular Rate:Normal     Neuro/Psych  Headaches, Seizures -,  Hx of brain tumor    GI/Hepatic GERD-  ,  Endo/Other  Morbid obesity  Renal/GU      Musculoskeletal   Abdominal (+) + obese,   Peds  Hematology   Anesthesia Other Findings   Reproductive/Obstetrics                          Anesthesia Physical Anesthesia Plan  ASA: III  Anesthesia Plan: General   Post-op Pain Management:    Induction: Intravenous  Airway Management Planned: Oral ETT and Video Laryngoscope Planned  Additional Equipment:   Intra-op Plan:   Post-operative Plan: Extubation in OR  Informed Consent: I have reviewed the patients History and Physical, chart, labs and discussed the procedure including the risks, benefits and alternatives for the proposed anesthesia with the patient or authorized representative who has indicated his/her understanding and acceptance.     Plan Discussed with:   Anesthesia Plan Comments:        Anesthesia Quick Evaluation

## 2014-06-25 NOTE — Interval H&P Note (Signed)
History and Physical Interval Note:  06/25/2014 7:33 AM  James Torres  has presented today for surgery, with the diagnosis of left ulnar neuropathy  The various methods of treatment have been discussed with the patient and family. After consideration of risks, benefits and other options for treatment, the patient has consented to  Procedure(s): LEFT ULNAR NEUROPLASTY AT ELBOW (Left) as a surgical intervention .  The patient's history has been reviewed, patient examined, no change in status, stable for surgery.  I have reviewed the patient's chart and labs.  Questions were answered to the patient's satisfaction.     [name]

## 2014-06-25 NOTE — Transfer of Care (Signed)
Immediate Anesthesia Transfer of Care Note  Patient: James Torres  Procedure(s) Performed: Procedure(s): LEFT ULNAR NEUROPLASTY AT ELBOW (Left)  Patient Location: PACU  Anesthesia Type:General  Level of Consciousness: awake, alert , oriented and patient cooperative  Airway & Oxygen Therapy: Patient Spontanous Breathing and Patient connected to face mask oxygen  Post-op Assessment: Report given to PACU RN, Post -op Vital signs reviewed and stable and Patient moving all extremities  Post vital signs: Reviewed and stable  Complications: No apparent anesthesia complications

## 2014-06-25 NOTE — Discharge Instructions (Addendum)
Discharge Instructions   You have a light dressing on your hand.  You may begin gentle motion of your fingers and hand immediately, but you should not do any heavy lifting or gripping.  Elevate your hand to reduce pain & swelling of the digits.  Ice over the operative site may be helpful to reduce pain & swelling.  DO NOT USE HEAT. Pain medicine has been prescribed for you.  Use your medicine as needed over the first 48 hours, and then you can begin to taper your use. You may use Tylenol in place of your prescribed pain medication, but not IN ADDITION to it. Leave the dressing in place until you return to the office for your first postop appointment.  Please keep it clean and dry. You may drive a car when you are off of prescription pain medications and can safely control your vehicle with both hands. We will address whether therapy will be required or not when you return to the office. You may have already made your follow-up appointment when we completed your preop visit.  If not, please call our office today or the next business day to make your return appointment for 10-15 days after surgery.   Please call [phone] during normal business hours or [phone] after hours for any problems. Including the following:  - excessive redness of the incisions - drainage for more than 4 days - fever of more than 101.5 F  *Please note that pain medications will not be refilled after hours or on weekends. What to eat:  For your first meals, you should eat lightly; only small meals initially.  If you do not have nausea, you may eat larger meals.  Avoid spicy, greasy and heavy food.    General Anesthesia, Adult, Care After  Refer to this sheet in the next few weeks. These instructions provide you with information on caring for yourself after your procedure. Your health care provider may also give you more specific instructions. Your treatment has been planned according to current medical  practices, but problems sometimes occur. Call your health care provider if you have any problems or questions after your procedure.  WHAT TO EXPECT AFTER THE PROCEDURE  After the procedure, it is typical to experience:  Sleepiness.  Nausea and vomiting. HOME CARE INSTRUCTIONS  For the first 24 hours after general anesthesia:  Have a responsible person with you.  Do not drive a car. If you are alone, do not take public transportation.  Do not drink alcohol.  Do not take medicine that has not been prescribed by your health care provider.  Do not sign important papers or make important decisions.  You may resume a normal diet and activities as directed by your health care provider.  Change bandages (dressings) as directed.  If you have questions or problems that seem related to general anesthesia, call the hospital and ask for the anesthetist or anesthesiologist on call. SEEK MEDICAL CARE IF:  You have nausea and vomiting that continue the day after anesthesia.  You develop a rash. SEEK IMMEDIATE MEDICAL CARE IF:  You have difficulty breathing.  You have chest pain.  You have any allergic problems. Document Released: 01/28/2001 Document Revised: 06/24/2013 Document Reviewed: 05/07/2013  [facility] Patient Information 2014 [place], [place].

## 2014-06-28 ENCOUNTER — Encounter (HOSPITAL_COMMUNITY): Payer: Self-pay | Admitting: Orthopedic Surgery

## 2014-11-10 ENCOUNTER — Other Ambulatory Visit (HOSPITAL_COMMUNITY): Payer: Self-pay | Admitting: Respiratory Therapy

## 2014-11-10 DIAGNOSIS — R569 Unspecified convulsions: Secondary | ICD-10-CM

## 2014-11-18 ENCOUNTER — Ambulatory Visit (HOSPITAL_COMMUNITY)
Admission: RE | Admit: 2014-11-18 | Discharge: 2014-11-18 | Disposition: A | Discharge status: Home / Self Care | Payer: Medicare PPO | Source: Ambulatory Visit | Attending: Neurology | Admitting: Neurology

## 2014-11-18 DIAGNOSIS — R569 Unspecified convulsions: Secondary | ICD-10-CM

## 2014-11-18 NOTE — Procedures (Signed)
EEG report.  Brief clinical history: 49 years old male with spells versus seizures. Technique: this is a 17 channel routine scalp EEG performed at the bedside with bipolar and monopolar montages arranged in accordance to the international 10/20 system of electrode placement. One channel was dedicated to EKG recording.  No sleep was achieved during the test. Intermittent photic stimulation was utilized as activating procedure.  Description:In the wakeful state, the best background consisted of a medium amplitude, posterior dominant, well sustained, symmetric and reactive 10 Hz rhythm. Intermittent photic stimulation did induce a normal driving response.  No focal or generalized epileptiform discharges noted.  No pathologic areas of slowing seen.  EKG showed sinus rhythm.  Impression: this is a normal awake EEG. Please, be aware that a normal EEG does not exclude the possibility of epilepsy.  Clinical correlation is advised.   Dorian Pod, MD [facility]

## 2014-11-18 NOTE — Progress Notes (Signed)
EEG Completed; Results Pending  

## 2014-12-06 ENCOUNTER — Other Ambulatory Visit: Payer: Self-pay | Admitting: Neurology

## 2014-12-06 DIAGNOSIS — Z87898 Personal history of other specified conditions: Secondary | ICD-10-CM

## 2014-12-15 ENCOUNTER — Ambulatory Visit
Admission: RE | Admit: 2014-12-15 | Discharge: 2014-12-15 | Disposition: A | Discharge status: Home / Self Care | Payer: Medicare PPO | Source: Ambulatory Visit | Attending: Neurology | Admitting: Neurology

## 2014-12-15 DIAGNOSIS — Z87898 Personal history of other specified conditions: Secondary | ICD-10-CM

## 2014-12-15 MED ORDER — GADOBENATE DIMEGLUMINE 529 MG/ML IV SOLN
20.0000 mL | Freq: Once | INTRAVENOUS | Status: AC | PRN
  Administered 2014-12-15: 20 mL via INTRAVENOUS

## 2015-03-21 ENCOUNTER — Other Ambulatory Visit (HOSPITAL_COMMUNITY): Payer: Medicare PPO

## 2015-03-21 ENCOUNTER — Encounter (HOSPITAL_COMMUNITY)
Admission: EM | Disposition: A | Discharge status: Home / Self Care | Payer: No Typology Code available for payment source | Source: Home / Self Care | Attending: Cardiology

## 2015-03-21 ENCOUNTER — Inpatient Hospital Stay (HOSPITAL_COMMUNITY)
Admission: EM | Admit: 2015-03-21 | Discharge: 2015-03-23 | DRG: 247 | Disposition: A | Discharge status: Home / Self Care | Payer: Medicare PPO | Attending: Cardiology | Admitting: Cardiology

## 2015-03-21 ENCOUNTER — Encounter (HOSPITAL_COMMUNITY): Payer: Self-pay | Admitting: Emergency Medicine

## 2015-03-21 ENCOUNTER — Emergency Department (HOSPITAL_COMMUNITY): Payer: Medicare PPO

## 2015-03-21 DIAGNOSIS — Z6841 Body Mass Index (BMI) 40.0 and over, adult: Secondary | ICD-10-CM | POA: Diagnosis not present

## 2015-03-21 DIAGNOSIS — R739 Hyperglycemia, unspecified: Secondary | ICD-10-CM | POA: Diagnosis present

## 2015-03-21 DIAGNOSIS — E8881 Metabolic syndrome: Secondary | ICD-10-CM | POA: Diagnosis present

## 2015-03-21 DIAGNOSIS — I5032 Chronic diastolic (congestive) heart failure: Secondary | ICD-10-CM | POA: Diagnosis present

## 2015-03-21 DIAGNOSIS — I255 Ischemic cardiomyopathy: Secondary | ICD-10-CM | POA: Diagnosis present

## 2015-03-21 DIAGNOSIS — K219 Gastro-esophageal reflux disease without esophagitis: Secondary | ICD-10-CM | POA: Diagnosis present

## 2015-03-21 DIAGNOSIS — E669 Obesity, unspecified: Secondary | ICD-10-CM | POA: Diagnosis not present

## 2015-03-21 DIAGNOSIS — F17210 Nicotine dependence, cigarettes, uncomplicated: Secondary | ICD-10-CM | POA: Diagnosis present

## 2015-03-21 DIAGNOSIS — I2510 Atherosclerotic heart disease of native coronary artery without angina pectoris: Secondary | ICD-10-CM

## 2015-03-21 DIAGNOSIS — R569 Unspecified convulsions: Secondary | ICD-10-CM

## 2015-03-21 DIAGNOSIS — I209 Angina pectoris, unspecified: Secondary | ICD-10-CM

## 2015-03-21 DIAGNOSIS — F419 Anxiety disorder, unspecified: Secondary | ICD-10-CM | POA: Diagnosis present

## 2015-03-21 DIAGNOSIS — G43709 Chronic migraine without aura, not intractable, without status migrainosus: Secondary | ICD-10-CM | POA: Diagnosis present

## 2015-03-21 DIAGNOSIS — Z79899 Other long term (current) drug therapy: Secondary | ICD-10-CM

## 2015-03-21 DIAGNOSIS — I214 Non-ST elevation (NSTEMI) myocardial infarction: Secondary | ICD-10-CM | POA: Diagnosis present

## 2015-03-21 DIAGNOSIS — Z720 Tobacco use: Secondary | ICD-10-CM

## 2015-03-21 DIAGNOSIS — Z7902 Long term (current) use of antithrombotics/antiplatelets: Secondary | ICD-10-CM

## 2015-03-21 DIAGNOSIS — G4733 Obstructive sleep apnea (adult) (pediatric): Secondary | ICD-10-CM | POA: Diagnosis not present

## 2015-03-21 DIAGNOSIS — R0789 Other chest pain: Secondary | ICD-10-CM

## 2015-03-21 DIAGNOSIS — I249 Acute ischemic heart disease, unspecified: Secondary | ICD-10-CM | POA: Diagnosis not present

## 2015-03-21 DIAGNOSIS — F329 Major depressive disorder, single episode, unspecified: Secondary | ICD-10-CM | POA: Diagnosis present

## 2015-03-21 DIAGNOSIS — Z7982 Long term (current) use of aspirin: Secondary | ICD-10-CM

## 2015-03-21 DIAGNOSIS — R079 Chest pain, unspecified: Secondary | ICD-10-CM | POA: Diagnosis not present

## 2015-03-21 DIAGNOSIS — D496 Neoplasm of unspecified behavior of brain: Secondary | ICD-10-CM | POA: Diagnosis present

## 2015-03-21 DIAGNOSIS — I252 Old myocardial infarction: Secondary | ICD-10-CM | POA: Diagnosis present

## 2015-03-21 HISTORY — DX: Morbid (severe) obesity due to excess calories: E66.01

## 2015-03-21 HISTORY — DX: Metabolic syndrome: E88.81

## 2015-03-21 HISTORY — DX: Hyperlipidemia, unspecified: E78.5

## 2015-03-21 HISTORY — DX: Migraine, unspecified, not intractable, without status migrainosus: G43.909

## 2015-03-21 HISTORY — DX: Tobacco use: Z72.0

## 2015-03-21 HISTORY — PX: CARDIAC CATHETERIZATION: SHX172

## 2015-03-21 HISTORY — DX: Atherosclerotic heart disease of native coronary artery without angina pectoris: I25.10

## 2015-03-21 HISTORY — DX: Ischemic cardiomyopathy: I25.5

## 2015-03-21 LAB — CBC
HEMATOCRIT: 46.4 % (ref 39.0–52.0)
HEMATOCRIT: 45 % (ref 39.0–52.0)
HEMOGLOBIN: 16 g/dL (ref 13.0–17.0)
Hemoglobin: 15.8 g/dL (ref 13.0–17.0)
MCH: 31.3 pg (ref 26.0–34.0)
MCH: 31.6 pg (ref 26.0–34.0)
MCHC: 34.5 g/dL (ref 30.0–36.0)
MCHC: 35.1 g/dL (ref 30.0–36.0)
MCV: 90.6 fL (ref 78.0–100.0)
MCV: 90 fL (ref 78.0–100.0)
PLATELETS: 187 10*3/uL (ref 150–400)
Platelets: 174 10*3/uL (ref 150–400)
RBC: 5.12 MIL/uL (ref 4.22–5.81)
RBC: 5 MIL/uL (ref 4.22–5.81)
RDW: 14 % (ref 11.5–15.5)
RDW: 14.1 % (ref 11.5–15.5)
WBC: 7.2 10*3/uL (ref 4.0–10.5)
WBC: 8 10*3/uL (ref 4.0–10.5)

## 2015-03-21 LAB — CBG MONITORING, ED: Glucose-Capillary: 162 mg/dL — ABNORMAL HIGH (ref 65–99)

## 2015-03-21 LAB — BASIC METABOLIC PANEL WITH GFR
Anion gap: 10 (ref 5–15)
BUN: 8 mg/dL (ref 6–20)
CO2: 29 mmol/L (ref 22–32)
Calcium: 9.3 mg/dL (ref 8.9–10.3)
Chloride: 98 mmol/L — ABNORMAL LOW (ref 101–111)
Creatinine, Ser: 1.09 mg/dL (ref 0.61–1.24)
GFR calc Af Amer: >60 mL/min
GFR calc non Af Amer: >60 mL/min
Glucose, Bld: 174 mg/dL — ABNORMAL HIGH (ref 65–99)
Potassium: 4.7 mmol/L (ref 3.5–5.1)
Sodium: 137 mmol/L (ref 135–145)

## 2015-03-21 LAB — CREATININE, SERUM
Creatinine, Ser: 0.92 mg/dL (ref 0.61–1.24)
GFR calc Af Amer: >60 mL/min (ref >60)

## 2015-03-21 LAB — I-STAT TROPONIN, ED: Troponin i, poc: 0.06 ng/mL (ref 0.00–0.08)

## 2015-03-21 LAB — TROPONIN I
TROPONIN I: 1.96 ng/mL — AB (ref <0.031)
Troponin I: 0.05 ng/mL — ABNORMAL HIGH
Troponin I: 0.48 ng/mL — ABNORMAL HIGH (ref <0.031)
Troponin I: 12.8 ng/mL (ref <0.031)

## 2015-03-21 LAB — D-DIMER, QUANTITATIVE: D-Dimer, Quant: 0.27 ug/mL-FEU (ref 0.00–0.48)

## 2015-03-21 LAB — BRAIN NATRIURETIC PEPTIDE: B Natriuretic Peptide: 13.4 pg/mL (ref 0.0–100.0)

## 2015-03-21 LAB — POCT ACTIVATED CLOTTING TIME: ACTIVATED CLOTTING TIME: 552 s

## 2015-03-21 SURGERY — LEFT HEART CATH AND CORONARY ANGIOGRAPHY
Anesthesia: LOCAL

## 2015-03-21 MED ORDER — HEPARIN (PORCINE) IN NACL 2-0.9 UNIT/ML-% IJ SOLN
INTRAMUSCULAR | Status: AC
  Filled 2015-03-21: qty 1000

## 2015-03-21 MED ORDER — NITROGLYCERIN IN D5W 200-5 MCG/ML-% IV SOLN: 2.0000 ug/min | INTRAVENOUS | Status: DC

## 2015-03-21 MED ORDER — NITROGLYCERIN 0.2 MG/ML ON CALL CATH LAB
INTRAVENOUS | Status: DC | PRN
  Administered 2015-03-21 (×3): 200 ug via INTRACORONARY

## 2015-03-21 MED ORDER — NITROGLYCERIN 1 MG/10 ML FOR IR/CATH LAB
INTRA_ARTERIAL | Status: AC
  Filled 2015-03-21: qty 10

## 2015-03-21 MED ORDER — SODIUM CHLORIDE 0.9 % WEIGHT BASED INFUSION: 3.0000 mL/kg/h | INTRAVENOUS | Status: AC

## 2015-03-21 MED ORDER — LEVETIRACETAM 500 MG PO TABS
500.0000 mg | ORAL_TABLET | Freq: Two times a day (BID) | ORAL | Status: DC
  Administered 2015-03-21 – 2015-03-23 (×5): 500 mg via ORAL
  Filled 2015-03-21 (×8): qty 1

## 2015-03-21 MED ORDER — NITROGLYCERIN 0.4 MG SL SUBL
0.4000 mg | SUBLINGUAL_TABLET | SUBLINGUAL | Status: DC | PRN
  Administered 2015-03-21 (×2): 0.4 mg via SUBLINGUAL

## 2015-03-21 MED ORDER — SODIUM CHLORIDE 0.9 % IJ SOLN: 3.0000 mL | Freq: Two times a day (BID) | INTRAMUSCULAR | Status: DC

## 2015-03-21 MED ORDER — GI COCKTAIL ~~LOC~~
30.0000 mL | Freq: Four times a day (QID) | ORAL | Status: DC | PRN
  Filled 2015-03-21: qty 30

## 2015-03-21 MED ORDER — HEPARIN SODIUM (PORCINE) 5000 UNIT/ML IJ SOLN
5000.0000 [IU] | Freq: Three times a day (TID) | INTRAMUSCULAR | Status: DC
  Administered 2015-03-21: 5000 [IU] via SUBCUTANEOUS
  Filled 2015-03-21: qty 1

## 2015-03-21 MED ORDER — SODIUM CHLORIDE 0.9 % IV SOLN: 250.0000 mL | INTRAVENOUS | Status: DC | PRN

## 2015-03-21 MED ORDER — LAMOTRIGINE 100 MG PO TABS
100.0000 mg | ORAL_TABLET | Freq: Two times a day (BID) | ORAL | Status: DC
  Administered 2015-03-21 – 2015-03-23 (×5): 100 mg via ORAL
  Filled 2015-03-21 (×8): qty 1

## 2015-03-21 MED ORDER — CLOPIDOGREL BISULFATE 75 MG PO TABS
75.0000 mg | ORAL_TABLET | Freq: Every day | ORAL | Status: DC
  Administered 2015-03-22 – 2015-03-23 (×2): 75 mg via ORAL
  Filled 2015-03-21 (×4): qty 1

## 2015-03-21 MED ORDER — NITROGLYCERIN IN D5W 200-5 MCG/ML-% IV SOLN
0.0000 ug/min | Freq: Once | INTRAVENOUS | Status: AC
  Administered 2015-03-21: 10 ug/min via INTRAVENOUS
  Filled 2015-03-21: qty 250

## 2015-03-21 MED ORDER — MORPHINE SULFATE 4 MG/ML IJ SOLN
4.0000 mg | Freq: Once | INTRAMUSCULAR | Status: AC
  Administered 2015-03-21: 4 mg via INTRAVENOUS
  Filled 2015-03-21: qty 1

## 2015-03-21 MED ORDER — SODIUM CHLORIDE 0.9 % IJ SOLN: 3.0000 mL | INTRAMUSCULAR | Status: DC | PRN

## 2015-03-21 MED ORDER — SODIUM CHLORIDE 0.9 % IV SOLN: INTRAVENOUS | Status: AC

## 2015-03-21 MED ORDER — BIVALIRUDIN BOLUS VIA INFUSION - CUPID
INTRAVENOUS | Status: DC | PRN
  Administered 2015-03-21: 127.575 mg via INTRAVENOUS

## 2015-03-21 MED ORDER — LIDOCAINE HCL (PF) 1 % IJ SOLN
INTRAMUSCULAR | Status: AC
  Filled 2015-03-21: qty 30

## 2015-03-21 MED ORDER — PNEUMOCOCCAL VAC POLYVALENT 25 MCG/0.5ML IJ INJ
0.5000 mL | INJECTION | INTRAMUSCULAR | Status: AC
  Administered 2015-03-22: 0.5 mL via INTRAMUSCULAR
  Filled 2015-03-21: qty 0.5

## 2015-03-21 MED ORDER — FENTANYL CITRATE (PF) 100 MCG/2ML IJ SOLN
INTRAMUSCULAR | Status: AC
  Filled 2015-03-21: qty 2

## 2015-03-21 MED ORDER — CARBAMAZEPINE ER 400 MG PO TB12
1200.0000 mg | ORAL_TABLET | Freq: Every evening | ORAL | Status: DC
  Administered 2015-03-21 – 2015-03-22 (×2): 1200 mg via ORAL
  Filled 2015-03-21 (×4): qty 3

## 2015-03-21 MED ORDER — CITALOPRAM HYDROBROMIDE 40 MG PO TABS
40.0000 mg | ORAL_TABLET | Freq: Every morning | ORAL | Status: DC
  Administered 2015-03-21 – 2015-03-23 (×3): 40 mg via ORAL
  Filled 2015-03-21 (×3): qty 1
  Filled 2015-03-21: qty 2

## 2015-03-21 MED ORDER — PANTOPRAZOLE SODIUM 40 MG PO TBEC
80.0000 mg | DELAYED_RELEASE_TABLET | ORAL | Status: DC
  Administered 2015-03-21 – 2015-03-23 (×3): 80 mg via ORAL
  Filled 2015-03-21 (×4): qty 2

## 2015-03-21 MED ORDER — ISOMETHEPTENE-DICHLORAL-APAP 65-100-325 MG PO CAPS
1.0000 | ORAL_CAPSULE | Freq: Four times a day (QID) | ORAL | Status: DC | PRN
  Administered 2015-03-21: 1 via ORAL
  Filled 2015-03-21 (×2): qty 1

## 2015-03-21 MED ORDER — VERAPAMIL HCL 2.5 MG/ML IV SOLN
INTRAVENOUS | Status: AC
  Filled 2015-03-21: qty 2

## 2015-03-21 MED ORDER — MIDAZOLAM HCL 2 MG/2ML IJ SOLN
INTRAMUSCULAR | Status: AC
  Filled 2015-03-21: qty 2

## 2015-03-21 MED ORDER — SODIUM CHLORIDE 0.9 % IV SOLN
0.2500 mg/kg/h | INTRAVENOUS | Status: DC
  Filled 2015-03-21: qty 250

## 2015-03-21 MED ORDER — ATORVASTATIN CALCIUM 80 MG PO TABS
80.0000 mg | ORAL_TABLET | Freq: Every day | ORAL | Status: DC
  Administered 2015-03-21 – 2015-03-22 (×2): 80 mg via ORAL
  Filled 2015-03-21 (×2): qty 1

## 2015-03-21 MED ORDER — METOPROLOL TARTRATE 12.5 MG HALF TABLET
12.5000 mg | ORAL_TABLET | Freq: Two times a day (BID) | ORAL | Status: DC
  Administered 2015-03-21 (×2): 12.5 mg via ORAL
  Filled 2015-03-21 (×2): qty 1

## 2015-03-21 MED ORDER — SODIUM CHLORIDE 0.9 % WEIGHT BASED INFUSION: 1.0000 mL/kg/h | INTRAVENOUS | Status: DC

## 2015-03-21 MED ORDER — ASPIRIN EC 81 MG PO TBEC
81.0000 mg | DELAYED_RELEASE_TABLET | Freq: Every day | ORAL | Status: DC
  Administered 2015-03-21: 81 mg via ORAL
  Filled 2015-03-21: qty 1

## 2015-03-21 MED ORDER — HEPARIN SODIUM (PORCINE) 1000 UNIT/ML IJ SOLN
INTRAMUSCULAR | Status: DC | PRN
  Administered 2015-03-21: 5000 [IU] via INTRAVENOUS

## 2015-03-21 MED ORDER — SODIUM CHLORIDE 0.9 % IV SOLN: INTRAVENOUS | Status: DC

## 2015-03-21 MED ORDER — CLONAZEPAM 0.5 MG PO TABS
0.5000 mg | ORAL_TABLET | Freq: Two times a day (BID) | ORAL | Status: DC
  Administered 2015-03-21 – 2015-03-23 (×5): 0.5 mg via ORAL
  Filled 2015-03-21 (×5): qty 1

## 2015-03-21 MED ORDER — CARBAMAZEPINE ER 400 MG PO TB12: 800.0000 mg | ORAL_TABLET | Freq: Two times a day (BID) | ORAL | Status: DC

## 2015-03-21 MED ORDER — CARBAMAZEPINE ER 400 MG PO TB12
800.0000 mg | ORAL_TABLET | Freq: Every day | ORAL | Status: DC
  Administered 2015-03-21 – 2015-03-23 (×3): 800 mg via ORAL
  Filled 2015-03-21 (×4): qty 2

## 2015-03-21 MED ORDER — ONDANSETRON HCL 4 MG/2ML IJ SOLN: 4.0000 mg | Freq: Four times a day (QID) | INTRAMUSCULAR | Status: DC | PRN

## 2015-03-21 MED ORDER — ASPIRIN 81 MG PO CHEW
81.0000 mg | CHEWABLE_TABLET | ORAL | Status: AC
  Administered 2015-03-22: 81 mg via ORAL
  Filled 2015-03-21: qty 1

## 2015-03-21 MED ORDER — ASPIRIN EC 81 MG PO TBEC
81.0000 mg | DELAYED_RELEASE_TABLET | Freq: Every day | ORAL | Status: DC
  Administered 2015-03-23: 81 mg via ORAL
  Filled 2015-03-21 (×4): qty 1

## 2015-03-21 MED ORDER — CLOPIDOGREL BISULFATE 300 MG PO TABS
ORAL_TABLET | ORAL | Status: AC
  Filled 2015-03-21: qty 2

## 2015-03-21 MED ORDER — CLOPIDOGREL BISULFATE 300 MG PO TABS
ORAL_TABLET | ORAL | Status: DC | PRN
  Administered 2015-03-21: 600 mg via ORAL

## 2015-03-21 MED ORDER — OXYCODONE-ACETAMINOPHEN 5-325 MG PO TABS
1.0000 | ORAL_TABLET | ORAL | Status: DC | PRN
  Administered 2015-03-22 (×2): 2 via ORAL
  Filled 2015-03-21 (×2): qty 2

## 2015-03-21 MED ORDER — HEPARIN SODIUM (PORCINE) 1000 UNIT/ML IJ SOLN
INTRAMUSCULAR | Status: AC
  Filled 2015-03-21: qty 1

## 2015-03-21 MED ORDER — ALPRAZOLAM 0.5 MG PO TABS: 0.5000 mg | ORAL_TABLET | Freq: Every evening | ORAL | Status: DC | PRN

## 2015-03-21 MED ORDER — NICOTINE 21 MG/24HR TD PT24
21.0000 mg | MEDICATED_PATCH | Freq: Every day | TRANSDERMAL | Status: DC
  Administered 2015-03-21 – 2015-03-23 (×3): 21 mg via TRANSDERMAL
  Filled 2015-03-21 (×4): qty 1

## 2015-03-21 MED ORDER — FENTANYL CITRATE (PF) 100 MCG/2ML IJ SOLN
INTRAMUSCULAR | Status: DC | PRN
  Administered 2015-03-21 (×2): 25 ug via INTRAVENOUS

## 2015-03-21 MED ORDER — MIDAZOLAM HCL 2 MG/2ML IJ SOLN
INTRAMUSCULAR | Status: DC | PRN
  Administered 2015-03-21 (×2): 2 mg via INTRAVENOUS

## 2015-03-21 MED ORDER — ACETAMINOPHEN 325 MG PO TABS
650.0000 mg | ORAL_TABLET | ORAL | Status: DC | PRN
  Filled 2015-03-21: qty 2

## 2015-03-21 MED ORDER — MORPHINE SULFATE 4 MG/ML IJ SOLN
4.0000 mg | INTRAMUSCULAR | Status: DC | PRN
  Administered 2015-03-21: 4 mg via INTRAVENOUS
  Filled 2015-03-21: qty 1

## 2015-03-21 MED ORDER — BIVALIRUDIN 250 MG IV SOLR
INTRAVENOUS | Status: AC
  Filled 2015-03-21: qty 250

## 2015-03-21 MED ORDER — HEPARIN BOLUS VIA INFUSION
4000.0000 [IU] | Freq: Once | INTRAVENOUS | Status: DC
  Filled 2015-03-21: qty 4000

## 2015-03-21 MED ORDER — BIVALIRUDIN 250 MG IV SOLR
250.0000 mg | INTRAVENOUS | Status: DC | PRN
  Administered 2015-03-21: 1.75 mg/kg/h via INTRAVENOUS

## 2015-03-21 MED ORDER — VERAPAMIL HCL 2.5 MG/ML IV SOLN
INTRAVENOUS | Status: DC | PRN
  Administered 2015-03-21: 12:00:00 via INTRA_ARTERIAL

## 2015-03-21 SURGICAL SUPPLY — 24 items
BALLN EUPHORA RX 2.5X15 (BALLOONS) ×2
BALLN ~~LOC~~ EUPHORA RX 3.0X20 (BALLOONS) ×2
BALLOON EUPHORA RX 2.5X15 (BALLOONS) IMPLANT
BALLOON ~~LOC~~ EUPHORA RX 3.0X20 (BALLOONS) IMPLANT
CATH EXTRAC PRONTO 5.5F 138CM (CATHETERS) ×1 IMPLANT
CATH EXTRAC PRONTO LP 6F RND (CATHETERS) ×1 IMPLANT
CATH INFINITI 5 FR JL3.5 (CATHETERS) ×2 IMPLANT
CATH INFINITI 5FR ANG PIGTAIL (CATHETERS) ×2 IMPLANT
CATH INFINITI 5FR MULTPACK ANG (CATHETERS) IMPLANT
CATH INFINITI JR4 5F (CATHETERS) ×2 IMPLANT
CATH VISTA GUIDE 6FR XBLAD3.5 (CATHETERS) ×1 IMPLANT
DEVICE RAD COMP TR BAND LRG (VASCULAR PRODUCTS) ×2 IMPLANT
GLIDESHEATH SLEND SS 6F .021 (SHEATH) ×2 IMPLANT
KIT ENCORE 26 ADVANTAGE (KITS) ×1 IMPLANT
KIT HEART LEFT (KITS) ×2 IMPLANT
PACK CARDIAC CATHETERIZATION (CUSTOM PROCEDURE TRAY) ×2 IMPLANT
SHEATH PINNACLE 5F 10CM (SHEATH) IMPLANT
STENT SYNERGY DES 2.75X38 (Permanent Stent) ×1 IMPLANT
SYR MEDRAD MARK V 150ML (SYRINGE) ×2 IMPLANT
TRANSDUCER W/STOPCOCK (MISCELLANEOUS) ×2 IMPLANT
TUBING CIL FLEX 10 FLL-RA (TUBING) ×2 IMPLANT
WIRE COUGAR XT STRL 190CM (WIRE) ×1 IMPLANT
WIRE EMERALD 3MM-J .035X150CM (WIRE) IMPLANT
WIRE SAFE-T 1.5MM-J .035X260CM (WIRE) ×2 IMPLANT

## 2015-03-21 NOTE — Progress Notes (Signed)
Pt seen earlier this evening. Spoke with Dr Ellene Route and he feels patient is not at excessive risk of bleeding related to his brain tumor. He should be able to continue DAPT x 12 months. Pt feels well without recurrence of chest pain.  Sherren Mocha 03/21/2015 11:55 PM

## 2015-03-21 NOTE — H&P (View-Only) (Signed)
Called due to recurrence of anterior substernal discomfort with bilateral wrist pian. Had discomfort 2 nights ago and was self limiting. Awakened him again last night which led to ER. IV NTG started and discomfort resolved around 5 AM. Since transferring to floor, discomfort has recurred. Currently rated 6/10 in severity. Admitting ECG with hyperacute anterolateral T- wave abnormality. Current ECG poor R wave progression with resolution of the T wave abnormality. Markers are trending up with increase from .05 on admission to .48 at 6 AM. Plan immediate cath for suspected stuttering anterolateral MI. IV heparin 4000 U ordered. Discussed cath, rationale, and risk of stroke, death, MI, kidney injury, vascular injury among other complications. He is willing to proceed.  35 minutes spent with the patient and interpreting data, and scheduling urgent cath.

## 2015-03-21 NOTE — Progress Notes (Addendum)
Called due to recurrence of anterior substernal discomfort with bilateral wrist pian. Had discomfort 2 nights ago and was self limiting. Awakened him again last night which led to ER. IV NTG started and discomfort resolved around 5 AM. Since transferring to floor, discomfort has recurred. Currently rated 6/10 in severity. Admitting ECG with hyperacute anterolateral T- wave abnormality. Current ECG poor R wave progression with resolution of the T wave abnormality. Markers are trending up with increase from .05 on admission to .48 at 6 AM. Plan immediate cath for suspected stuttering anterolateral MI. IV heparin 4000 U ordered. Discussed cath, rationale, and risk of stroke, death, MI, kidney injury, vascular injury among other complications. He is willing to proceed.  35 minutes spent with the patient and interpreting data, and scheduling urgent cath.

## 2015-03-21 NOTE — ED Notes (Signed)
Pt arrives via mid chest pain, reproducible that started this evening. Hx of brain tumor and seizures. 1 SL nitro and 324MG  ASA on board, effective for pain.

## 2015-03-21 NOTE — Progress Notes (Signed)
CRITICAL VALUE ALERT  Critical value received:  TROPONIN = 12.8  Date of notification:  03/21/15  Time of notification:  1703  Critical value read back:Yes.    Nurse who received alert:  Chaney Born  MD notified (1st page):  BRYAN HAGER PA  Time of first page:  1707  MD notified (2nd page):  Time of second page:  Responding MD:  Tarri Fuller PA  Time MD responded:  [phone]

## 2015-03-21 NOTE — ED Provider Notes (Signed)
CSN: [id number]     Arrival date & time 03/21/15  0310 History   First MD Initiated Contact with Patient 03/21/15 0335     Chief Complaint  Patient presents with  . Chest Pain     (Consider location/radiation/quality/duration/timing/severity/associated sxs/prior Treatment) HPI 49 year old male presents to emergency department via EMS from home with complaint of chest pain.  Patient reports he had onset of bilateral forearm pain Saturday evening that woke her from sleep.  After tossing and turning.  He was able to get back to sleep.  Patient awoke again this morning with bilateral forearm pain.  He was unable to get comfortable in the bed.  He reports that he went to the bathroom and had a loose stool.  He developed chest pain and became diaphoretic.  Patient was unable to get comfortable.  The chest pain and arm pain worsened.  Patient went to the kitchen to try to drink some milk which did not help symptoms.  He called 911.  Patient received aspirin and nitroglycerin which eased symptoms.  He reports upon arrival, symptoms had returned and he received another dose of nitroglycerin.  After standing for his chest x-ray in getting back to the bed, he began feeling worse again, with recurrence of bilateral arm pain and chest pain.  He reports some shortness of breath and nausea as well.  Patient has history of obesity, anxiety, brain tumor, status post excision and recurrence, chronic migraines and seizures.  He denies any family history of coronary disease.  Patient is a smoker.  He denies known history of diabetes or hypertension. Past Medical History  Diagnosis Date  . Brain tumor   . Sleep apnea 2009  . Anxiety   . Depression   . Kidney stone   . GERD (gastroesophageal reflux disease)   . Seizures     Has auras and metalic taste in mouth.  Last one 06/2014- last a few seconds, has one every couple weeks, Dr Mare Loan is neurologist and is aware.   [name] Headache(784.0)     migraines   Past  Surgical History  Procedure Laterality Date  . Brain surgery  2004    Crainotomy for tumor  . Knee arthroscopy Right 2005 approx    cartliage  . Wisdom tooth extraction    . Ulnar nerve transposition Left 06/25/2014    Procedure: LEFT ULNAR NEUROPLASTY AT ELBOW;  Surgeon: Jolyn Nap, MD;  Location: [facility];  Service: Orthopedics;  Laterality: Left;   No family history on file. History  Substance Use Topics  . Smoking status: Current Every Day Smoker -- 1.00 packs/day for 20 years    Types: Cigarettes  . Smokeless tobacco: Never Used  . Alcohol Use: No    Review of Systems   See History of Present Illness; otherwise all other systems are reviewed and negative  Allergies  Review of patient's allergies indicates no known allergies.  Home Medications   Prior to Admission medications   Medication Sig Start Date End Date Taking? Authorizing Provider  ALPRAZolam Duanne Moron) 0.5 MG tablet Take 0.5 mg by mouth at bedtime as needed for sleep.     Historical Provider, MD  carbamazepine (TEGRETOL XR) 400 MG 12 hr tablet Take 800-1,200 mg by mouth 2 (two) times daily. Takes 800mg  in the morning and 1200mg  in the evening    Historical Provider, MD  citalopram (CELEXA) 40 MG tablet Take 40 mg by mouth every morning.    Historical Provider, MD  clonazePAM Bobbye Charleston)  0.5 MG tablet Take 0.5 mg by mouth 2 (two) times daily.     Historical Provider, MD  Coenzyme Q10 (CO Q 10) 10 MG CAPS Take 1 tablet by mouth 2 (two) times daily.    Historical Provider, MD  dihydroergotamine (MIGRANAL) 4 MG/ML nasal spray Place 1 spray into the nose daily as needed for migraine. Use in one nostril as directed.  No more than 4 sprays in one hour    Historical Provider, MD  gabapentin (NEURONTIN) 300 MG capsule Take 300 mg by mouth 3 (three) times daily.    Historical Provider, MD  ibuprofen (ADVIL,MOTRIN) 600 MG tablet Take 600 mg by mouth every 8 (eight) hours as needed (pain).    Historical Provider, MD   isometheptene-acetaminophen-dichloralphenazone (MIDRIN) 65-325-100 MG capsule Take 1 capsule by mouth 4 (four) times daily as needed. Maximum 5 capsules in 12 hours for migraine headaches, 8 capsules in 24 hours for tension headaches.    Historical Provider, MD  MAGNESIUM PO Take 250 mg by mouth 2 (two) times daily.    Historical Provider, MD  Omeprazole-Sodium Bicarbonate (ZEGERID) 20-1100 MG CAPS Take 2 capsules by mouth 2 (two) times daily.     Historical Provider, MD  oxyCODONE-acetaminophen (ROXICET) 5-325 MG per tablet Take 1-2 tablets by mouth every 4 (four) hours as needed for severe pain. 06/25/14   Milly Jakob, MD  RIBOFLAVIN PO Take 1 tablet by mouth 2 (two) times daily.    Historical Provider, MD   BP 120/68 mmHg  Pulse 77  Temp(Src) 97.7 F (36.5 C) (Oral)  Resp 10  Ht 6' (1.829 m)  Wt 375 lb (170.099 kg)  BMI 50.85 kg/m2  SpO2 96% Physical Exam  Constitutional: He is oriented to person, place, and time. He appears well-developed and well-nourished. He appears distressed.  Obese, uncomfortable appearing  HENT:  Head: Normocephalic and atraumatic.  Nose: Nose normal.  Mouth/Throat: Oropharynx is clear and moist.  Eyes: Conjunctivae and EOM are normal. Pupils are equal, round, and reactive to light.  Neck: Normal range of motion. Neck supple. No JVD present. No tracheal deviation present. No thyromegaly present.  Cardiovascular: Normal rate, regular rhythm, normal heart sounds and intact distal pulses.  Exam reveals no gallop and no friction rub.   No murmur heard. Pulmonary/Chest: Effort normal and breath sounds normal. No stridor. No respiratory distress. He has no wheezes. He has no rales. He exhibits no tenderness.  Abdominal: Soft. Bowel sounds are normal. He exhibits no distension and no mass. There is no tenderness. There is no rebound and no guarding.  Musculoskeletal: Normal range of motion. He exhibits no edema or tenderness.  Lymphadenopathy:    He has no  cervical adenopathy.  Neurological: He is alert and oriented to person, place, and time. He displays normal reflexes. He exhibits normal muscle tone. Coordination normal.  Skin: Skin is warm and dry. No rash noted. No erythema. No pallor.  Psychiatric: He has a normal mood and affect. His behavior is normal. Judgment and thought content normal.  Nursing note and vitals reviewed.   ED Course  Procedures (including critical care time) Labs Review Labs Reviewed  BASIC METABOLIC PANEL - Abnormal; Notable for the following:    Chloride 98 (*)    Glucose, Bld 174 (*)    All other components within normal limits  CBG MONITORING, ED - Abnormal; Notable for the following:    Glucose-Capillary 162 (*)    All other components within normal limits  CBC  BRAIN  NATRIURETIC PEPTIDE  TROPONIN I  Randolm Idol, ED    Imaging Review Dg Chest 2 View  03/21/2015   CLINICAL DATA:  Awoke with mid chest pain. Pain radiates down both arms.  EXAM: CHEST  2 VIEW  COMPARISON:  07/19/2013  FINDINGS: Mild elevation of right hemidiaphragm again seen. The cardiomediastinal contours are normal. Pulmonary vasculature is normal. No consolidation, pleural effusion, or pneumothorax. No acute osseous abnormalities are seen.  IMPRESSION: No acute pulmonary process.   Electronically Signed   By: Jeb Levering M.D.   On: 03/21/2015 04:11     EKG Interpretation   Date/Time:  Monday Mar 21 2015 03:13:44 EDT Ventricular Rate:  82 PR Interval:  164 QRS Duration: 94 QT Interval:  373 QTC Calculation: 436 R Axis:   18 Text Interpretation:  Sinus rhythm Low voltage, precordial leads Minimal  ST depression, inferior leads new st depressions in inferior leads  compared to prior Confirmed by Rejoice Heatwole  MD, Dimetri Armitage ([id number]) on 03/21/2015  4:11:08 AM      MDM   Final diagnoses:  Chest pain, atypical   49 year old male with bilateral arm pain and chest pain, diaphoresis and shortness of breath.  Story is concerning for  cardiac etiology.  Patient is obese, he is hyperglycemic here.  He has had intermittent improvement with nitroglycerin.  IV nitroglycerin drip initiated.  Patient has new ST depressions inferiorly.  Compared to prior.  Case was discussed with cardiology.  Given concern for ACS.  Linton Flemings, MD 03/21/15 [phone]

## 2015-03-21 NOTE — Consult Note (Deleted)
Patient ID: James Torres MRN: [id number], DOB/AGE: 1966/06/16   Admit date: 03/21/2015   Primary Physician:  James Crutch, MD Primary Cardiologist: None  Pt. Profile:  8M smoker with a history of a brain tumor with recurrence with conservative management, OSA on CPAP, depression, GERD, seizures who p/w arm aches and chest pain.  Problem List  Past Medical History  Diagnosis Date  . Brain tumor   . Sleep apnea 2009  . Anxiety   . Depression   . Kidney stone   . GERD (gastroesophageal reflux disease)   . Seizures     Has auras and metalic taste in mouth.  Last one 06/2014- last a few seconds, has one every couple weeks, James Torres is neurologist and is aware.   [name] Headache(784.0)     migraines    Past Surgical History  Procedure Laterality Date  . Brain surgery  2004    Crainotomy for tumor  . Knee arthroscopy Right 2005 approx    cartliage  . Wisdom tooth extraction    . Ulnar nerve transposition Left 06/25/2014    Procedure: LEFT ULNAR NEUROPLASTY AT ELBOW;  Surgeon: James Nap, MD;  Location: [facility];  Service: Orthopedics;  Laterality: Left;     Allergies  No Known Allergies  HPI   8M smoker with a history of a brain tumor with recurrence with conservative management, OSA on CPAP, depression, GERD, seizures who p/w arm aches and chest pain.  James Torres reports last night he awoke with "aching" in both of his arms. He was able to reposition, sx improved, and he fell back asleep. Felt normal self yesterday during the day. The night of admission he woke up again with a similar sensation. He was unable to find a comfortable spot. He got up from bed and went to the bathroom and developed CP and diarrhea and diaphoresis. He described it as "tight" and an "ache." 8-10/10 in severity.  Somtimes worse with palpation, inspiration, and movement. Thought it was GERD initially and drank milk and this did not help. He woke up his fiance and she called 911.   On  arrival to the ER he was hemodynamically stable. 85bpm, RR 18, 133/77, 98% on RA.Labs were notable for K 4.7, Cr 1.09. POC TnI 0.06. CXR demonstrated no acute process.ECG demonstrated NSR with inferior ST scooping and NSSTTWC. He was given SL NTG x 2 and started on a IV gtt. He states pain improved with IV NTG after successive up titrations. He stated that after uptitration of the IV NTG, the pain was no longer reproducible with inspiration or palpation.   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 He had minor seizures after the operation (metallic flavor in mouth) and eventually chronic migraines. Over the past few mos he was diagnosed with a recurrence that is being conservatively managed with AEDs only. Spokes 1-1.5ppd x 25 years. No tobacco or ilicits. Engaged. On disability d/t migraines and seizures. Mother has history migraines and depression. Father was born with congenital AV abnormality that required surgery and he eventually had PPM implant. No FH of CAD.   Home Medications  Prior to Admission medications   Medication Sig Start Date End Date Taking? Authorizing Provider  ALPRAZolam James Torres) 0.5 MG tablet Take 0.5 mg by mouth at bedtime as needed for sleep.     Historical Provider, MD  carbamazepine (TEGRETOL XR) 400 MG 12 hr tablet Take 800-1,200 mg  by mouth 2 (two) times daily. Takes 800mg  in the morning and 1200mg  in the evening    Historical Provider, MD  citalopram (CELEXA) 40 MG tablet Take 40 mg by mouth every morning.    Historical Provider, MD  clonazePAM (KLONOPIN) 0.5 MG tablet Take 0.5 mg by mouth 2 (two) times daily.     Historical Provider, MD  Coenzyme Q10 (CO Q 10) 10 MG CAPS Take 1 tablet by mouth 2 (two) times daily.    Historical Provider, MD  dihydroergotamine (MIGRANAL) 4 MG/ML nasal spray Place 1 spray into the nose daily as needed for migraine. Use in one nostril as directed.  No more than 4 sprays in one hour    Historical Provider,  MD  gabapentin (NEURONTIN) 300 MG capsule Take 300 mg by mouth 3 (three) times daily.    Historical Provider, MD  ibuprofen (ADVIL,MOTRIN) 600 MG tablet Take 600 mg by mouth every 8 (eight) hours as needed (pain).    Historical Provider, MD  isometheptene-acetaminophen-dichloralphenazone (MIDRIN) 65-325-100 MG capsule Take 1 capsule by mouth 4 (four) times daily as needed. Maximum 5 capsules in 12 hours for migraine headaches, 8 capsules in 24 hours for tension headaches.    Historical Provider, MD  MAGNESIUM PO Take 250 mg by mouth 2 (two) times daily.    Historical Provider, MD  Omeprazole-Sodium Bicarbonate (ZEGERID) 20-1100 MG CAPS Take 2 capsules by mouth 2 (two) times daily.     Historical Provider, MD  oxyCODONE-acetaminophen (ROXICET) 5-325 MG per tablet Take 1-2 tablets by mouth every 4 (four) hours as needed for severe pain. 06/25/14   Milly Jakob, MD  RIBOFLAVIN PO Take 1 tablet by mouth 2 (two) times daily.    Historical Provider, MD    Family History  No family history on file.  Social History  History   Social History  . Marital Status: Divorced    Spouse Name: N/A  . Number of Children: N/A  . Years of Education: N/A   Occupational History  . Not on file.   Social History Main Topics  . Smoking status: Current Every Day Smoker -- 1.00 packs/day for 20 years    Types: Cigarettes  . Smokeless tobacco: Never Used  . Alcohol Use: No  . Drug Use: No  . Sexual Activity: Not Currently   Other Topics Concern  . Not on file   Social History Narrative     Review of Systems General:  No chills, fever, night sweats or weight changes.  Cardiovascular:  See HPI Dermatological: No rash, lesions/masses Respiratory: No cough, dyspnea Urologic: No hematuria, dysuria Abdominal:   No nausea, vomiting, bright red blood per rectum, melena, or hematemesis. + diarrhea Neurologic:  No visual changes, wkns, changes in mental status. All other systems reviewed and are otherwise  negative except as noted above.  Physical Exam  Blood pressure 120/68, pulse 77, temperature 97.7 F (36.5 C), temperature source Oral, resp. rate 10, height 6' (1.829 m), weight 170.099 kg (375 lb), SpO2 96 %.  General: Pleasant, NAD, morbidly obese. Multiple tatoos Psych: Normal affect. Neuro: Alert and oriented X 3. Moves all extremities spontaneously. HEENT: Normal  Neck: Supple without bruits or JVD. Lungs:  Resp regular and unlabored, CTA. Heart: RRR no s3, s4, or murmurs. Abdomen: Soft, non-tender, non-distended, BS + x 4.  Extremities: No clubbing, cyanosis or edema. DP/PT/Radials 2+ and equal bilaterally.  Labs  Troponin [facility] of Care Test)  Recent Labs  03/21/15 0340  TROPIPOC 0.06  No results for input(s): CKTOTAL, CKMB, TROPONINI in the last 72 hours. Lab Results  Component Value Date   WBC 7.2 03/21/2015   HGB 16.0 03/21/2015   HCT 46.4 03/21/2015   MCV 90.6 03/21/2015   PLT 174 03/21/2015    Recent Labs Lab 03/21/15 0328  NA 137  K 4.7  CL 98*  CO2 29  BUN 8  CREATININE 1.09  CALCIUM 9.3  GLUCOSE 174*   No results found for: CHOL, HDL, LDLCALC, TRIG No results found for: DDIMER   Radiology/Studies  Dg Chest 2 View  03/21/2015   CLINICAL DATA:  Awoke with mid chest pain. Pain radiates down both arms.  EXAM: CHEST  2 VIEW  COMPARISON:  07/19/2013  FINDINGS: Mild elevation of right hemidiaphragm again seen. The cardiomediastinal contours are normal. Pulmonary vasculature is normal. No consolidation, pleural effusion, or pneumothorax. No acute osseous abnormalities are seen.  IMPRESSION: No acute pulmonary process.   Electronically Signed   By: Jeb Levering M.D.   On: 03/21/2015 04:11    ECG   06/24/14: NSR. NSSTTWC 03/21/15: NSR. Inferior ST scooping. NSSTTWC.  ASSESSMENT AND PLAN  57M smoker with a history of a brain tumor with recurrence with conservative management, OSA on CPAP, depression, GERD, seizures who p/w arm aches and chest  pain.  The presenting symptoms have typical (sx quality, improvement with nitro) and atypical (reproducibility, albeit inconsistently) qualities. He is higher risk given morbid obesity. He remains on nitro gtt with negative 1st troponin.  ECG with inferior scooping that is not definitely ischemic.   1. Continue NTG gtt for now; will attempt to give dose IV morphine and wean NTG 2. If remains NTG dependent, will probably require cath. If he can be weaned and remains CP free, stress test would be reasonable. 3. Heparin if troponin turn + 4. ASA, atorva 80mg , metoprolol 12.5mg  BID 5. D-dimer as PE r/o 6. If he needs cardiac cath, will need to t/b with neurologist re: any bleeding and/or DAPT issues with brian tumor. 7. Nicotine patch, smoking cessation counselling   Signed, Lamar Sprinkles, MD 03/21/2015, 4:33 AM

## 2015-03-21 NOTE — Interval H&P Note (Signed)
History and Physical Interval Note:  03/21/2015 11:36 AM  Sander Radon  has presented today for surgery, with the diagnosis of chesp pain  The various methods of treatment have been discussed with the patient and family. After consideration of risks, benefits and other options for treatment, the patient has consented to  Procedure(s): Left Heart Cath and Coronary Angiography (N/A) as a surgical intervention .  The patient's history has been reviewed, patient examined, no change in status, stable for surgery.  I have reviewed the patient's chart and labs.  Questions were answered to the patient's satisfaction.    Cath Lab Visit (complete for each Cath Lab visit)  Clinical Evaluation Leading to the Procedure:   ACS: Yes.    Non-ACS:    Anginal Classification: CCS IV  Anti-ischemic medical therapy: Minimal Therapy (1 class of medications)  Non-Invasive Test Results: No non-invasive testing performed  Prior CABG: No previous CABG       Sherren Mocha

## 2015-03-21 NOTE — H&P (Signed)
Patient ID: James Torres MRN: [id number], DOB/AGE: 1966/05/11   Admit date: 03/21/2015   Primary Physician:  James Crutch, MD Primary Cardiologist: None  Pt. Profile:  49M smoker with a history of a brain tumor with recurrence with conservative management, OSA on CPAP, depression, GERD, seizures who p/w arm aches and chest pain.  Problem List  Past Medical History  Diagnosis Date  . Brain tumor   . Sleep apnea 2009  . Anxiety   . Depression   . Kidney stone   . GERD (gastroesophageal reflux disease)   . Seizures     Has auras and metalic taste in mouth.  Last one 06/2014- last a few seconds, has one every couple weeks, Dr Mare Loan is neurologist and is aware.   [name] Headache(784.0)     migraines    Past Surgical History  Procedure Laterality Date  . Brain surgery  2004    Crainotomy for tumor  . Knee arthroscopy Right 2005 approx    cartliage  . Wisdom tooth extraction    . Ulnar nerve transposition Left 06/25/2014    Procedure: LEFT ULNAR NEUROPLASTY AT ELBOW;  Surgeon: Jolyn Nap, MD;  Location: [facility];  Service: Orthopedics;  Laterality: Left;     Allergies  No Known Allergies  HPI   49M smoker with a history of a brain tumor with recurrence with conservative management, OSA on CPAP, depression, GERD, seizures who p/w arm aches and chest pain.  James Torres reports last night he awoke with "aching" in both of his arms. He was able to reposition, sx improved, and he fell back asleep. Felt normal self yesterday during the day. The night of admission he woke up again with a similar sensation. He was unable to find a comfortable spot. He got up from bed and went to the bathroom and developed CP and diarrhea and diaphoresis. He described it as "tight" and an "ache." 8-10/10 in severity.  Somtimes worse with palpation, inspiration, and movement. Thought it was GERD initially and drank milk and this did not help. He woke up his fiance and she called 911.   On  arrival to the ER he was hemodynamically stable. 85bpm, RR 18, 133/77, 98% on RA.Labs were notable for K 4.7, Cr 1.09. POC TnI 0.06. CXR demonstrated no acute process.ECG demonstrated NSR with inferior ST scooping and NSSTTWC. He was given SL NTG x 2 and started on a IV gtt. He states pain improved with IV NTG after successive up titrations. He stated that after uptitration of the IV NTG, the pain was no longer reproducible with inspiration or palpation.   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 He had minor seizures after the operation (metallic flavor in mouth) and eventually chronic migraines. Over the past few mos he was diagnosed with a recurrence that is being conservatively managed with AEDs only. Spokes 1-1.5ppd x 25 years. No tobacco or ilicits. Engaged. On disability d/t migraines and seizures. Mother has history migraines and depression. Father was born with congenital AV abnormality that required surgery and he eventually had PPM implant. No FH of CAD.   Home Medications  Prior to Admission medications   Medication Sig Start Date End Date Taking? Authorizing Provider  ALPRAZolam Duanne Moron) 0.5 MG tablet Take 0.5 mg by mouth at bedtime as needed for sleep.     Historical Provider, MD  carbamazepine (TEGRETOL XR) 400 MG 12 hr tablet Take 800-1,200 mg  by mouth 2 (two) times daily. Takes 800mg  in the morning and 1200mg  in the evening    Historical Provider, MD  citalopram (CELEXA) 40 MG tablet Take 40 mg by mouth every morning.    Historical Provider, MD  clonazePAM (KLONOPIN) 0.5 MG tablet Take 0.5 mg by mouth 2 (two) times daily.     Historical Provider, MD  Coenzyme Q10 (CO Q 10) 10 MG CAPS Take 1 tablet by mouth 2 (two) times daily.    Historical Provider, MD  dihydroergotamine (MIGRANAL) 4 MG/ML nasal spray Place 1 spray into the nose daily as needed for migraine. Use in one nostril as directed.  No more than 4 sprays in one hour    Historical Provider,  MD  gabapentin (NEURONTIN) 300 MG capsule Take 300 mg by mouth 3 (three) times daily.    Historical Provider, MD  ibuprofen (ADVIL,MOTRIN) 600 MG tablet Take 600 mg by mouth every 8 (eight) hours as needed (pain).    Historical Provider, MD  isometheptene-acetaminophen-dichloralphenazone (MIDRIN) 65-325-100 MG capsule Take 1 capsule by mouth 4 (four) times daily as needed. Maximum 5 capsules in 12 hours for migraine headaches, 8 capsules in 24 hours for tension headaches.    Historical Provider, MD  MAGNESIUM PO Take 250 mg by mouth 2 (two) times daily.    Historical Provider, MD  Omeprazole-Sodium Bicarbonate (ZEGERID) 20-1100 MG CAPS Take 2 capsules by mouth 2 (two) times daily.     Historical Provider, MD  oxyCODONE-acetaminophen (ROXICET) 5-325 MG per tablet Take 1-2 tablets by mouth every 4 (four) hours as needed for severe pain. 06/25/14   Milly Jakob, MD  RIBOFLAVIN PO Take 1 tablet by mouth 2 (two) times daily.    Historical Provider, MD    Family History  No family history on file.  Social History  History   Social History  . Marital Status: Divorced    Spouse Name: N/A  . Number of Children: N/A  . Years of Education: N/A   Occupational History  . Not on file.   Social History Main Topics  . Smoking status: Current Every Day Smoker -- 1.00 packs/day for 20 years    Types: Cigarettes  . Smokeless tobacco: Never Used  . Alcohol Use: No  . Drug Use: No  . Sexual Activity: Not Currently   Other Topics Concern  . Not on file   Social History Narrative     Review of Systems General:  No chills, fever, night sweats or weight changes.  Cardiovascular:  See HPI Dermatological: No rash, lesions/masses Respiratory: No cough, dyspnea Urologic: No hematuria, dysuria Abdominal:   No nausea, vomiting, bright red blood per rectum, melena, or hematemesis. + diarrhea Neurologic:  No visual changes, wkns, changes in mental status. All other systems reviewed and are otherwise  negative except as noted above.  Physical Exam  Blood pressure 113/68, pulse 86, temperature 97.7 F (36.5 C), temperature source Oral, resp. rate 22, height 6' (1.829 m), weight 170.099 kg (375 lb), SpO2 95 %.  General: Pleasant, NAD, morbidly obese. Multiple tatoos Psych: Normal affect. Neuro: Alert and oriented X 3. Moves all extremities spontaneously. HEENT: Normal  Neck: Supple without bruits or JVD. Lungs:  Resp regular and unlabored, CTA. Heart: RRR no s3, s4, or murmurs. Abdomen: Soft, non-tender, non-distended, BS + x 4.  Extremities: No clubbing, cyanosis or edema. DP/PT/Radials 2+ and equal bilaterally.  Labs  Troponin [facility] of Care Test)  Recent Labs  03/21/15 0340  TROPIPOC 0.06  Recent Labs  03/21/15 0320  TROPONINI 0.05*   Lab Results  Component Value Date   WBC 7.2 03/21/2015   HGB 16.0 03/21/2015   HCT 46.4 03/21/2015   MCV 90.6 03/21/2015   PLT 174 03/21/2015     Recent Labs Lab 03/21/15 0328  NA 137  K 4.7  CL 98*  CO2 29  BUN 8  CREATININE 1.09  CALCIUM 9.3  GLUCOSE 174*   No results found for: CHOL, HDL, LDLCALC, TRIG No results found for: DDIMER   Radiology/Studies  Dg Chest 2 View  03/21/2015   CLINICAL DATA:  Awoke with mid chest pain. Pain radiates down both arms.  EXAM: CHEST  2 VIEW  COMPARISON:  07/19/2013  FINDINGS: Mild elevation of right hemidiaphragm again seen. The cardiomediastinal contours are normal. Pulmonary vasculature is normal. No consolidation, pleural effusion, or pneumothorax. No acute osseous abnormalities are seen.  IMPRESSION: No acute pulmonary process.   Electronically Signed   By: Jeb Levering M.D.   On: 03/21/2015 04:11    ECG   06/24/14: NSR. NSSTTWC 03/21/15: NSR. Inferior ST scooping. NSSTTWC.  ASSESSMENT AND PLAN  52M smoker with a history of a brain tumor with recurrence with conservative management, OSA on CPAP, depression, GERD, seizures who p/w arm aches and chest pain.  The presenting  symptoms have typical (sx quality, improvement with nitro) and atypical (reproducibility, albeit inconsistently) qualities. He is higher risk given morbid obesity. He remains on nitro gtt with negative 1st troponin.  ECG with inferior scooping that is not definitely ischemic.   1. Continue NTG gtt for now; will attempt to give dose IV morphine and wean NTG 2. If remains NTG dependent, will probably require cath. If he can be weaned and remains CP free, stress test would be reasonable. 3. Heparin if troponin turn + 4. ASA, atorva 80mg , metoprolol 12.5mg  BID 5. D-dimer as PE r/o 6. If he needs cardiac cath, will need to t/b with neurologist re: any bleeding and/or DAPT issues with brian tumor. 7. Nicotine patch, smoking cessation counselling  8. Continue home AEDs  Signed, Lamar Sprinkles, MD 03/21/2015, 6:28 AM

## 2015-03-21 NOTE — ED Notes (Signed)
Wrong provider placed on D-dimer order, corrected with new order, discontinued the incorrect provider order.

## 2015-03-21 NOTE — ED Notes (Signed)
MD at bedside. 

## 2015-03-21 NOTE — ED Notes (Signed)
Cardiology at BS

## 2015-03-22 ENCOUNTER — Inpatient Hospital Stay (HOSPITAL_COMMUNITY): Payer: Medicare PPO

## 2015-03-22 ENCOUNTER — Encounter (HOSPITAL_COMMUNITY): Payer: Self-pay | Admitting: Nurse Practitioner

## 2015-03-22 ENCOUNTER — Telehealth: Payer: Self-pay | Admitting: Interventional Cardiology

## 2015-03-22 DIAGNOSIS — R739 Hyperglycemia, unspecified: Secondary | ICD-10-CM | POA: Diagnosis present

## 2015-03-22 DIAGNOSIS — R079 Chest pain, unspecified: Secondary | ICD-10-CM

## 2015-03-22 DIAGNOSIS — Z720 Tobacco use: Secondary | ICD-10-CM | POA: Diagnosis present

## 2015-03-22 DIAGNOSIS — I255 Ischemic cardiomyopathy: Secondary | ICD-10-CM | POA: Diagnosis present

## 2015-03-22 DIAGNOSIS — I252 Old myocardial infarction: Secondary | ICD-10-CM | POA: Diagnosis present

## 2015-03-22 DIAGNOSIS — I214 Non-ST elevation (NSTEMI) myocardial infarction: Secondary | ICD-10-CM | POA: Diagnosis not present

## 2015-03-22 DIAGNOSIS — I5032 Chronic diastolic (congestive) heart failure: Secondary | ICD-10-CM | POA: Diagnosis present

## 2015-03-22 DIAGNOSIS — I2510 Atherosclerotic heart disease of native coronary artery without angina pectoris: Secondary | ICD-10-CM | POA: Diagnosis present

## 2015-03-22 LAB — HEPATIC FUNCTION PANEL
ALBUMIN: 3.6 g/dL (ref 3.5–5.0)
ALT: 35 U/L (ref 17–63)
AST: 140 U/L — AB (ref 15–41)
Alkaline Phosphatase: 95 U/L (ref 38–126)
Bilirubin, Direct: 0.1 mg/dL (ref 0.1–0.5)
Indirect Bilirubin: 0.5 mg/dL (ref 0.3–0.9)
Total Bilirubin: 0.6 mg/dL (ref 0.3–1.2)
Total Protein: 6.3 g/dL — ABNORMAL LOW (ref 6.5–8.1)

## 2015-03-22 LAB — LIPID PANEL
CHOL/HDL RATIO: 7.4 ratio
Cholesterol: 274 mg/dL — ABNORMAL HIGH (ref 0–200)
HDL: 37 mg/dL — ABNORMAL LOW (ref >40)
LDL CALC: 195 mg/dL — AB (ref 0–99)
Triglycerides: 209 mg/dL — ABNORMAL HIGH (ref <150)
VLDL: 42 mg/dL — ABNORMAL HIGH (ref 0–40)

## 2015-03-22 LAB — CBC
HCT: 44.3 % (ref 39.0–52.0)
Hemoglobin: 14.9 g/dL (ref 13.0–17.0)
MCH: 30.5 pg (ref 26.0–34.0)
MCHC: 33.6 g/dL (ref 30.0–36.0)
MCV: 90.6 fL (ref 78.0–100.0)
PLATELETS: 175 10*3/uL (ref 150–400)
RBC: 4.89 MIL/uL (ref 4.22–5.81)
RDW: 14.4 % (ref 11.5–15.5)
WBC: 6.5 10*3/uL (ref 4.0–10.5)

## 2015-03-22 LAB — BRAIN NATRIURETIC PEPTIDE: B NATRIURETIC PEPTIDE 5: 89 pg/mL (ref 0.0–100.0)

## 2015-03-22 LAB — BASIC METABOLIC PANEL
ANION GAP: 9 (ref 5–15)
BUN: 8 mg/dL (ref 6–20)
CO2: 26 mmol/L (ref 22–32)
Calcium: 8.9 mg/dL (ref 8.9–10.3)
Chloride: 100 mmol/L — ABNORMAL LOW (ref 101–111)
Creatinine, Ser: 0.91 mg/dL (ref 0.61–1.24)
GFR calc Af Amer: >60 mL/min (ref >60)
GLUCOSE: 108 mg/dL — AB (ref 65–99)
Potassium: 4 mmol/L (ref 3.5–5.1)
Sodium: 135 mmol/L (ref 135–145)

## 2015-03-22 MED ORDER — CARVEDILOL 3.125 MG PO TABS
3.1250 mg | ORAL_TABLET | Freq: Two times a day (BID) | ORAL | Status: DC
  Administered 2015-03-22 – 2015-03-23 (×3): 3.125 mg via ORAL
  Filled 2015-03-22 (×4): qty 1

## 2015-03-22 MED ORDER — PERFLUTREN LIPID MICROSPHERE
1.0000 mL | INTRAVENOUS | Status: AC | PRN
  Administered 2015-03-22: 10:00:00 3 mL via INTRAVENOUS
  Filled 2015-03-22: qty 10

## 2015-03-22 MED ORDER — FUROSEMIDE 10 MG/ML IJ SOLN
40.0000 mg | Freq: Once | INTRAMUSCULAR | Status: AC
  Administered 2015-03-22: 40 mg via INTRAVENOUS
  Filled 2015-03-22: qty 4

## 2015-03-22 MED FILL — Lidocaine HCl Local Preservative Free (PF) Inj 1%: INTRAMUSCULAR | Qty: 30 | Status: AC

## 2015-03-22 MED FILL — Heparin Sodium (Porcine) 2 Unit/ML in Sodium Chloride 0.9%: INTRAMUSCULAR | Qty: 1000 | Status: AC

## 2015-03-22 NOTE — Progress Notes (Signed)
RT Note: Pt places self on home cpap.

## 2015-03-22 NOTE — Telephone Encounter (Signed)
New message    7 days tcm per chris b with scott weaver on  5.24.2016

## 2015-03-22 NOTE — Progress Notes (Signed)
  Echocardiogram 2D Echocardiogram with Definity has been performed.  James Torres 03/22/2015, 11:26 AM

## 2015-03-22 NOTE — Progress Notes (Addendum)
CARDIAC REHAB PHASE I   PRE:  Rate/Rhythm: 84 SR  BP:  Sitting: 108/42        SaO2: 93 Ra  MODE:  Ambulation: 750 ft   POST:  Rate/Rhythm: 101 ST  BP:  Sitting: 127/62         SaO2: 95 RA  Pt ambulated 750 ft on RA, independent, steady gait, tolerated well.  Pt c/o of mild DOE, denies cp, dizziness, standing rest x1. Pt states his activity has been very limited due to his weight and chronic migraines and seizures. Completed MI/stent education.  Reviewed anti-platelet therapy, stent card, risk factors, tobacco cessation, activity restrictions, ntg, exercise, heart healthy diet,  portion control, daily weights, sodium and fluid restrictions (gave pt CHF book), phase 2 cardiac rehab. Gave patient video information for MI/CHF/Tobacco cessation videos. Pt verbalized understanding, states he is overwhelmed and that these are "major lifestyle changes" for him. Pt agrees to phase 2 cardiac rehab. Will send referral to [facility].  Pt to recliner after walk, call bell within reach, fiance at bedside.   [phone]  Lenna Sciara, RN, BSN 03/22/2015 9:36 AM

## 2015-03-22 NOTE — Progress Notes (Signed)
Patient Name: James Torres Date of Encounter: 03/22/2015   Principal Problem:   NSTEMI (non-ST elevated myocardial infarction) Active Problems:   CAD (coronary artery disease)   Hyperglycemia   Tobacco abuse   Cardiomyopathy, ischemic   Seizures    Morbid Obesity    OSA  SUBJECTIVE  No c/p overnight.  Was dyspneic walking back from bathroom last night.  No r radial/wrist pain.  Good spirits.  CURRENT MEDS . aspirin  81 mg Oral Pre-Cath  . [START ON 03/23/2015] aspirin EC  81 mg Oral Daily  . atorvastatin  80 mg Oral q1800  . carbamazepine  1,200 mg Oral QPM  . carbamazepine  800 mg Oral Daily  . citalopram  40 mg Oral q morning - 10a  . clonazePAM  0.5 mg Oral BID  . clopidogrel  75 mg Oral Q breakfast  . lamoTRIgine  100 mg Oral BID  . levETIRAcetam  500 mg Oral BID  . metoprolol tartrate  12.5 mg Oral BID  . nicotine  21 mg Transdermal Daily  . pantoprazole  80 mg Oral BH-q7a  . pneumococcal 23 valent vaccine  0.5 mL Intramuscular Tomorrow-1000    OBJECTIVE  Filed Vitals:   03/21/15 2027 03/22/15 0016 03/22/15 0415 03/22/15 0804  BP: 117/70 114/66 123/74 108/42  Pulse: 85 79 83 82  Temp: 98.4 F (36.9 C) 98.3 F (36.8 C) 97.9 F (36.6 C) 97.9 F (36.6 C)  TempSrc: Oral Axillary Oral Oral  Resp: 19 17 20 19   Height:      Weight:  376 lb 15.8 oz (171 kg)    SpO2: 94% 94% 95% 94%    Intake/Output Summary (Last 24 hours) at 03/22/15 0822 Last data filed at 03/22/15 0806  Gross per 24 hour  Intake    480 ml  Output   1600 ml  Net  -1120 ml   Filed Weights   03/21/15 0329 03/21/15 0815 03/22/15 0016  Weight: 375 lb (170.099 kg) 375 lb (170.1 kg) 376 lb 15.8 oz (171 kg)    PHYSICAL EXAM  General: Obese, Pleasant, NAD. Neuro: Alert and oriented X 3. Moves all extremities spontaneously. Psych: Normal affect. HEENT:  Normal  Neck: Supple without bruits.  Difficult to gauge jvp 2/2 girth. Lungs:  Resp regular and unlabored, CTA. Heart: RRR,  distant, no s3, s4, or murmurs. Abdomen: Soft, non-tender, non-distended, BS + x 4.  Extremities: No clubbing, cyanosis or edema. DP/PT/Radials 2+ and equal bilaterally.  Accessory Clinical Findings  CBC  Recent Labs  03/21/15 0640 03/22/15 0405  WBC 8.0 6.5  HGB 15.8 14.9  HCT 45.0 44.3  MCV 90.0 90.6  PLT 187 325   Basic Metabolic Panel  Recent Labs  03/21/15 0328 03/21/15 0952 03/22/15 0405  NA 137  --  135  K 4.7  --  4.0  CL 98*  --  100*  CO2 29  --  26  GLUCOSE 174*  --  108*  BUN 8  --  8  CREATININE 1.09 0.92 0.91  CALCIUM 9.3  --  8.9   Cardiac Enzymes  Recent Labs  03/21/15 0640 03/21/15 0952 03/21/15 1539  TROPONINI 0.48* 1.96* 12.80*   D-Dimer  Recent Labs  03/21/15 0640  DDIMER 0.27   TELE  Rsr, brief run of aivr.  ECG  Rsr, 78, ant infarct.  Radiology/Studies  Dg Chest 2 View  03/21/2015   CLINICAL DATA:  Awoke with mid chest pain. Pain radiates down both arms.  EXAM: CHEST  2 VIEW  COMPARISON:  07/19/2013  FINDINGS: Mild elevation of right hemidiaphragm again seen. The cardiomediastinal contours are normal. Pulmonary vasculature is normal. No consolidation, pleural effusion, or pneumothorax. No acute osseous abnormalities are seen.  IMPRESSION: No acute pulmonary process.   Electronically Signed   By: Jeb Levering M.D.   On: 03/21/2015 04:11   ASSESSMENT AND PLAN  1.  Acute NSTEMI/CAD:  S/p pci/DES to the LAD yesterday.  No c/p overnight though he was dyspneic with ambulation to the bathroom.  Residual, diffuse, 75% distal LAD dzs with plan for medical mgmt.  Ambulate today.  Check echo to eval EF - 35-45% on LV gram.  Cont asa, plavix, bb, high potency statin.  In setting of LV dysfxn, will add low dose acei.  2.  Ischemic cardiomyopathy:  EF 35-45% by LV gram yesterday.  F/U echo today.  Dyspneic with ambulation. Difficult to gauge volume on exam - no obvious overload, however EDP 51mmHg on cath yesterday.  Will give a dose of  IV lasix.  Switch bb to coreg.  Will plan to add low dose acei but want to see how his BP tolerates coreg first.  Renal fxn stable.  3.  Lipids:  F/U fasting lipids/lft's this morning.  Cont high potency statin.  4.  Tob Abuse:  Complete cessation advised.  Pt says he will quit.  5.  Morbid Obesity:  Will benefit from cardiac rehab and nutrition counseling.  6.  Hyperglycemia:  F/u A1c.  7.  OSA:  Uses CPAP.  8.  H/o brain tumor/Sz d/o:  Cont home meds.  Per NSU - ok to cont DAPT x 12 mos.  Signed, Murray Hodgkins NP

## 2015-03-23 ENCOUNTER — Encounter (HOSPITAL_COMMUNITY): Payer: Self-pay | Admitting: Cardiology

## 2015-03-23 DIAGNOSIS — E8881 Metabolic syndrome: Secondary | ICD-10-CM

## 2015-03-23 HISTORY — DX: Metabolic syndrome: E88.81

## 2015-03-23 HISTORY — DX: Metabolic syndrome: E88.810

## 2015-03-23 LAB — HEMOGLOBIN A1C
HEMOGLOBIN A1C: 6.1 % — AB (ref 4.8–5.6)
MEAN PLASMA GLUCOSE: 128 mg/dL

## 2015-03-23 LAB — COMPREHENSIVE METABOLIC PANEL
ALBUMIN: 3.7 g/dL (ref 3.5–5.0)
ALT: 32 U/L (ref 17–63)
ANION GAP: 9 (ref 5–15)
AST: 96 U/L — AB (ref 15–41)
Alkaline Phosphatase: 97 U/L (ref 38–126)
BUN: 10 mg/dL (ref 6–20)
CO2: 26 mmol/L (ref 22–32)
CREATININE: 0.99 mg/dL (ref 0.61–1.24)
Calcium: 8.8 mg/dL — ABNORMAL LOW (ref 8.9–10.3)
Chloride: 100 mmol/L — ABNORMAL LOW (ref 101–111)
GFR calc Af Amer: >60 mL/min (ref >60)
GFR calc non Af Amer: >60 mL/min (ref >60)
Glucose, Bld: 103 mg/dL — ABNORMAL HIGH (ref 65–99)
POTASSIUM: 3.6 mmol/L (ref 3.5–5.1)
SODIUM: 135 mmol/L (ref 135–145)
TOTAL PROTEIN: 6.7 g/dL (ref 6.5–8.1)
Total Bilirubin: 0.5 mg/dL (ref 0.3–1.2)

## 2015-03-23 MED ORDER — ATORVASTATIN CALCIUM 80 MG PO TABS: 80.0000 mg | ORAL_TABLET | Freq: Every day | ORAL | Status: DC

## 2015-03-23 MED ORDER — LISINOPRIL 5 MG PO TABS
2.5000 mg | ORAL_TABLET | Freq: Every day | ORAL | Status: DC
  Administered 2015-03-23: 2.5 mg via ORAL
  Filled 2015-03-23: qty 1

## 2015-03-23 MED ORDER — LIVING WELL WITH DIABETES BOOK
Freq: Once | Status: AC
  Administered 2015-03-23: 12:00:00
  Filled 2015-03-23: qty 1

## 2015-03-23 MED ORDER — NICOTINE 14 MG/24HR TD PT24: 14.0000 mg | MEDICATED_PATCH | Freq: Every day | TRANSDERMAL | Status: DC

## 2015-03-23 MED ORDER — NICOTINE 21 MG/24HR TD PT24: 21.0000 mg | MEDICATED_PATCH | Freq: Every day | TRANSDERMAL | Status: AC

## 2015-03-23 MED ORDER — ASPIRIN 81 MG PO TBEC
81.0000 mg | DELAYED_RELEASE_TABLET | Freq: Every day | ORAL | Status: DC
Start: 2015-03-23 — End: 2022-11-28

## 2015-03-23 MED ORDER — CLOPIDOGREL BISULFATE 75 MG PO TABS: 75.0000 mg | ORAL_TABLET | Freq: Every day | ORAL | Status: DC

## 2015-03-23 MED ORDER — PANTOPRAZOLE SODIUM 40 MG PO TBEC: 80.0000 mg | DELAYED_RELEASE_TABLET | ORAL | Status: DC

## 2015-03-23 MED ORDER — OXYCODONE-ACETAMINOPHEN 5-325 MG PO TABS: 1.0000 | ORAL_TABLET | ORAL | Status: DC | PRN

## 2015-03-23 MED ORDER — CARVEDILOL 3.125 MG PO TABS: 3.1250 mg | ORAL_TABLET | Freq: Two times a day (BID) | ORAL | Status: DC

## 2015-03-23 MED ORDER — LISINOPRIL 2.5 MG PO TABS: 2.5000 mg | ORAL_TABLET | Freq: Every day | ORAL | Status: DC

## 2015-03-23 MED ORDER — NITROGLYCERIN 0.4 MG SL SUBL: 0.4000 mg | SUBLINGUAL_TABLET | SUBLINGUAL | Status: DC | PRN

## 2015-03-23 MED ORDER — ACETAMINOPHEN 325 MG PO TABS: 650.0000 mg | ORAL_TABLET | ORAL | Status: DC | PRN

## 2015-03-23 NOTE — Discharge Summary (Signed)
Physician Discharge Summary       Patient ID: MAALIK PINN MRN: [id number] DOB/AGE: Mar 04, 1966 49 y.o.  Admit date: 03/21/2015 Discharge date: 03/23/2015 Primary Cardiologist: Dr. Tamala Julian   Discharge Diagnoses:  Principal Problem:   NSTEMI (non-ST elevated myocardial infarction) Active Problems:   Seizures   Hyperglycemia   Tobacco abuse   CAD (coronary artery disease)   Cardiomyopathy, ischemic   Ischemic cardiomyopathy   Metabolic syndrome   Discharged Condition: good  Procedures: 03/21/15 cardiac cath and PTCA and aspiration thrombectomy and synergy DES to mid-LAD lesion by Dr. Burt Knack.   Hospital Course:  27M smoker with a history of a brain tumor with recurrence with conservative management, OSA on CPAP, depression, GERD, seizures who p/w arm aches and chest pain. On arrival to the ER 03/21/15 he was hemodynamically stable. 85bpm, RR 18, 133/77, 98% on RA.Labs were notable for K 4.7, Cr 1.09. POC TnI 0.06. CXR demonstrated no acute process.ECG demonstrated NSR with inferior ST scooping and NSSTTWC. He was given SL NTG x 2 and started on a IV gtt. He states pain improved with IV NTG after successive up titrations. He stated that after uptitration of the IV NTG, the pain was no longer reproducible with inspiration or palpation.    Of note, Mr. 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 He had minor seizures after the operation (metallic flavor in mouth) and eventually chronic migraines. Over the past few mos he was diagnosed with a recurrence that is being conservatively managed with AEDs only. Spokes 1-1.5ppd x 25 years. No tobacco or ilicits. Engaged. On disability d/t migraines and seizures. Mother has history migraines and depression.   After admit he continued with chest pain and went for urgent cath due to stuttering MI.    Pk troponin 12.8.  Rec'd DES to mLAD and tolerated the procedure well.  His EF was decreased.  He is on BB and ACE now.  Also on  statin for LDL 195. Also with HGBA1C at 6.1.  Follow up echo with higher EF.  Pt was ambulating with cardiac rehab without problems. Will add mod. Carb diet along with low salt. He will talk with dietician prior to discharge.    He has ambulated with cardiac rehab without complications.   He was seen and evaluated by Dr. Tamala Julian who found him stable for discharge.  He will follow up in 1 week as outpt.   He understands to stop smoking and is using nicoderm.  Midrin was stopped by Neuro.  I did give pain meds for his headache until he can see neuro.   Consults: None  Significant Diagnostic Studies:   BMP Latest Ref Rng 03/23/2015 03/22/2015 03/21/2015  Glucose 65 - 99 mg/dL 103(H) 108(H) -  BUN 6 - 20 mg/dL 10 8 -  Creatinine 0.61 - 1.24 mg/dL 0.99 0.91 0.92  Sodium 135 - 145 mmol/L 135 135 -  Potassium 3.5 - 5.1 mmol/L 3.6 4.0 -  Chloride 101 - 111 mmol/L 100(L) 100(L) -  CO2 22 - 32 mmol/L 26 26 -  Calcium 8.9 - 10.3 mg/dL 8.8(L) 8.9 -   CBC Latest Ref Rng 03/22/2015 03/21/2015 03/21/2015  WBC 4.0 - 10.5 K/uL 6.5 8.0 7.2  Hemoglobin 13.0 - 17.0 g/dL 14.9 15.8 16.0  Hematocrit 39.0 - 52.0 % 44.3 45.0 46.4  Platelets 150 - 400 K/uL 175 187 174   Troponin 12.8 pk  BNP 89  HGB A1C 6.1   ECHO: Study Conclusions  -  Left ventricle: The cavity size was normal. There was moderate focal basal hypertrophy of the septum. Systolic function was normal. The estimated ejection fraction was in the range of 50% to 55%. Definity contrast was administered - there is clear distal anterior, apical and inferoapical severe hypokinesis to dyskinesis. No thrombus was noted. The LV base is hyperdynamic. Doppler parameters are consistent with abnormal left ventricular relaxation (grade 1 diastolic dysfunction). The E/e&' ratio is between 8-15, suggesting indeterminate LV filing pressure. - Mitral valve: Mildly thickened leaflets . There was trivial regurgitation. - Left atrium: The  atrium was normal in size.  Impressions:  - LVEF 50-55%, hyperdynamic base with distal anterior, apical and inferoapical severe hypokinesis to dyskinesis, diastolic dysfunction, indeterminate LV filling pressure, normal LA size.   CHEST 2 VIEW COMPARISON: 07/19/2013 FINDINGS: Mild elevation of right hemidiaphragm again seen. The cardiomediastinal contours are normal. Pulmonary vasculature is normal. No consolidation, pleural effusion, or pneumothorax. No acute osseous abnormalities are seen. IMPRESSION: No acute pulmonary process.      Cardiac cath: 1. Total occlusion of the mid-LAD treated successfully with PTCA, aspiration thrombectomy, and a Synergy DES. 2. Moderate residual diffuse LAD stenosis 3. Moderate segmental LV systolic dysfunction, LVEF estimated at 40% Dominance: Right   Left Main  The vessel is angiographically normal.     Left Anterior Descending  Dist LAD filled by collaterals from Inf Sept.   . Ost LAD lesion, 50% stenosed. eccentric .   [name] Mid LAD lesion, 100% stenosed. diffuse .   [name] PCI: There is no pre-interventional antegrade distal flow (TIMI 0). Pre-stent angioplasty was performed. A drug-eluting stent was placed. Post-stent angioplasty was performed. Maximum pressure: 16 atm. The post-interventional distal flow is normal (TIMI 3). The intervention was successful. No complications occurred at this lesion. After PTCA, there was residual distal occlusion and heavy thrombus. Extensive aspiration thrombectomy was performed, and this restored flow to the apex of the heart. Following stenting and post-dilatation, there is wide patency of the stent site with 0% residual stenosis and TIMI-3 flow. However, the entire LAD is diffusely diseased with moderate ostial and distal vessel stenosis. I felt medical therapy is most appropriate for his residual stenosis.  . Supplies used: STENT SYNERGY DES 1.02H85; Juan Quam [id number]; [facility] PN3.6R44    . There is no residual stenosis post intervention.     Jorene Minors LAD lesion, 75% stenosed. diffuse .     Ramus Intermedius  There is mildthe vessel.     Left Circumflex  The vessel exhibits minimal luminal irregularities.   . First Obtuse Marginal Branch   The vessel exhibits minimal luminal irregularities.     Right Coronary Artery  There is mildthe vessel.   Colon Flattery RCA lesion, 50% stenosed.       Left Ventricle There is moderate left ventricular systolic dysfunction. The left ventricular ejection fraction is 35-45% by visual estimate. There are wall motion abnormalities in the left ventricle. There are segmental wall motion abnormalities in the left ventricle.    Aortic Valve There is no aortic valve stenosis.       Discharge Exam: Blood pressure 118/51, pulse 76, temperature 97.7 F (36.5 C), temperature source Oral, resp. rate 19, height 6' (1.829 m), weight 376 lb 8.7 oz (170.8 kg), SpO2 96 %.  Disposition: 01-Home or Self Care      Discharge Instructions    Amb Referral to Cardiac Rehabilitation    Complete by:  As directed   Congestive Heart Failure: If  diagnosis is Heart Failure, patient MUST meet each of the CMS criteria: 1. Left Ventricular Ejection Fraction </= 35% 2. NYHA class II-IV symptoms despite being on optimal heart failure therapy for at least 6 weeks. 3. Stable = have not had a recent (<6 weeks) or planned (<6 months) major cardiovascular hospitalization or procedure  Program Details: - Physician supervised classes - 1-3 classes per week over a 12-18 week period, generally for a total of 36 sessions  Physician Certification: I certify that the above Cardiac Rehabilitation treatment is medically necessary and is medically approved by me for treatment of this patient. The patient is willing and cooperative, able to ambulate and medically stable to participate in exercise rehabilitation. The participant's progress and Individualized Treatment Plan  will be reviewed by the Medical Director, Cardiac Rehab staff and as indicated by the Referring/Ordering Physician.  Diagnosis:   Myocardial Infarction PCI              Medication List    STOP taking these medications        ibuprofen 600 MG tablet  Commonly known as:  ADVIL,MOTRIN     isometheptene-acetaminophen-dichloralphenazone 65-325-100 MG capsule  Commonly known as:  MIDRIN     ZEGERID 20-1100 MG Caps capsule  Generic drug:  Omeprazole-Sodium Bicarbonate  Replaced by:  pantoprazole 40 MG tablet      TAKE these medications        acetaminophen 325 MG tablet  Commonly known as:  TYLENOL  Take 2 tablets (650 mg total) by mouth every 4 (four) hours as needed for headache or mild pain.     ALPRAZolam 0.5 MG tablet  Commonly known as:  XANAX  Take 0.5 mg by mouth at bedtime as needed for sleep.     aspirin 81 MG EC tablet  Take 1 tablet (81 mg total) by mouth daily.     atorvastatin 80 MG tablet  Commonly known as:  LIPITOR  Take 1 tablet (80 mg total) by mouth daily at 6 PM.     carbamazepine 400 MG 12 hr tablet  Commonly known as:  TEGRETOL XR  Take 800-1,200 mg by mouth 2 (two) times daily. Takes $RemoveBefo'800mg'oxgczkrQbTd$  in the morning and $RemoveBef'1200mg'rpAXkEZtff$  in the evening     carvedilol 3.125 MG tablet  Commonly known as:  COREG  Take 1 tablet (3.125 mg total) by mouth 2 (two) times daily with a meal.     citalopram 40 MG tablet  Commonly known as:  CELEXA  Take 40 mg by mouth every morning.     clonazePAM 0.5 MG tablet  Commonly known as:  KLONOPIN  Take 0.5 mg by mouth 2 (two) times daily.     clopidogrel 75 MG tablet  Commonly known as:  PLAVIX  Take 1 tablet (75 mg total) by mouth daily with breakfast.     lamoTRIgine 100 MG tablet  Commonly known as:  LAMICTAL  Take 100 mg by mouth 2 (two) times daily.     levETIRAcetam 500 MG tablet  Commonly known as:  KEPPRA  Take 500 mg by mouth 2 (two) times daily.     lisinopril 2.5 MG tablet  Commonly known as:   PRINIVIL,ZESTRIL  Take 1 tablet (2.5 mg total) by mouth daily.     nicotine 21 mg/24hr patch  Commonly known as:  NICODERM CQ - dosed in mg/24 hours  Place 1 patch (21 mg total) onto the skin daily.     nicotine 14 mg/24hr patch  Commonly known  as:  NICODERM CQ  Place 1 patch (14 mg total) onto the skin daily.  Start taking on:  04/07/2015     nitroGLYCERIN 0.4 MG SL tablet  Commonly known as:  NITROSTAT  Place 1 tablet (0.4 mg total) under the tongue every 5 (five) minutes as needed for chest pain.     oxyCODONE-acetaminophen 5-325 MG per tablet  Commonly known as:  ROXICET  Take 1-2 tablets by mouth every 4 (four) hours as needed for severe pain.     pantoprazole 40 MG tablet  Commonly known as:  PROTONIX  Take 2 tablets (80 mg total) by mouth every morning.       Follow-up Information    Follow up with Richardson Dopp, PA-C On 03/29/2015.   Specialties:  Physician Assistant, Radiology, Interventional Cardiology   Why:  8:30 AM - Dr. Thompson Caul PA.   Contact information:   [address] [place] [address] [phone]        Discharge Instructions: Take 1 NTG, under your tongue, while sitting.  If no relief of pain may repeat NTG, one tab every 5 minutes up to 3 tablets total over 15 minutes.  If no relief CALL 911.  If you have dizziness/lightheadness  while taking NTG, stop taking and call 911.        Heart healthy low carbohydrate diet, low salt diet also you are close to being diabetic, follow up with your primary MD.  Call [facility] at [phone] if any bleeding, swelling or drainage at cath site.  May shower, no tub baths for 48 hours for groin sticks. No lifting over 5 pounds for 7 days.  No Driving for 7 days  No work until seen back in the office.    Weigh daily Call [phone] if weight climbs more than 3 pounds in a day or 5 pounds in a week. No salt to very little salt in your diet.  No more than 2000 mg in a day. Call  if increased shortness of breath or increased swelling.        Signed: Isaiah Serge Nurse Practitioner-Certified [facility]: [facility] 03/23/2015, 11:39 AM  Time spent on discharge :  > 35 minutes.

## 2015-03-23 NOTE — Plan of Care (Addendum)
Problem: Food- and Nutrition-Related Knowledge Deficit (NB-1.1) Goal: Nutrition education Formal process to instruct or train a patient/client in a skill or to impart knowledge to help patients/clients voluntarily manage or modify food choices and eating behavior to maintain or improve health. Outcome: Completed/Met Date Met:  03/23/15  RD consulted for nutrition education regarding diabetes.   Per pt and fiance pt drinks regular decaf soda due to intolerance to sugar substitutes (causes migraines). He often skips breakfast and sometimes lunch. He is aware of need to limit sodium, fat, and cholesterol in his diet. Neither he or his fiance like to cook.     Lab Results  Component Value Date    HGBA1C 6.1* 03/22/2015    RD provided "Carbohydrate Counting for People with Diabetes" handout from the Academy of Nutrition and Dietetics. Discussed different food groups and their effects on blood sugar, emphasizing carbohydrate-containing foods. Provided list of carbohydrates and recommended serving sizes of common foods.  Discussed importance of controlled and consistent carbohydrate intake throughout the day. Provided examples of ways to balance meals/snacks and encouraged intake of high-fiber, whole grain complex carbohydrates. Teach back method used. Also reviewed low sodium guidelines, cooking low in sodium.  Expect fair/good compliance. Pt agreeable to take steps to avoid regular soda, reviewed options for alternatives. Pt and fiance with many questions which this RD answered. Both appreciative of visit.   Body mass index is 51.06 kg/(m^2). Pt meets criteria for extreme obesity class III based on current BMI.  Current diet order is Heart healthy, patient is consuming approximately 100% of meals at this time. Labs and medications reviewed. No further nutrition interventions warranted at this time. RD contact information provided. If additional nutrition issues arise, please re-consult RD. Pt to  go to outpatient DM education and to cardiac rehab after d/c.   [place], [place], [place] Pager [phone] After Hours Pager

## 2015-03-23 NOTE — Telephone Encounter (Signed)
Pt noted to still be admitted to hospital. Will leave on triage desk top for follow up on 5/19.

## 2015-03-23 NOTE — Progress Notes (Signed)
21M smoker with a history of a brain tumor with recurrence with conservative management, OSA on CPAP, depression, GERD, seizures who p/w arm aches and chest pain  + troponin at 10- NSTEMI, HLD.[name] CATH: Total occlusion of the mid-LAD treated successfully with PTCA, aspiration thrombectomy, and a Synergy DES. 2. Moderate residual diffuse LAD stenosis 3. Moderate segmental LV systolic dysfunction, LVEF estimated at 40%  Subjective: No complaints  Objective: Vital signs in last 24 hours: Temp:  [97.4 F (36.3 C)-98.4 F (36.9 C)] 97.4 F (36.3 C) (05/18 0329) Pulse Rate:  [75-83] 76 (05/18 0329) Resp:  [16-20] 20 (05/18 0329) BP: (100-120)/(59-77) 120/77 mmHg (05/18 0329) SpO2:  [91 %-96 %] 96 % (05/18 0329) Weight:  [376 lb 8.7 oz (170.8 kg)] 376 lb 8.7 oz (170.8 kg) (05/18 0038) Weight change: 1 lb 8.7 oz (0.7 kg) Last BM Date: 03/21/15 Intake/Output from previous day:  -1300  ( since admit -2540) 05/17 0701 - 05/18 0700 In: 600 [P.O.:600] Out: 1900 [Urine:1900] Intake/Output this shift:    PE: General:Pleasant affect, NAD Skin:Warm and dry, brisk capillary refill HEENT:normocephalic, sclera clear, mucus membranes moist Neck:supple, no JVD, no bruits  Heart:S1S2 RRR without murmur, gallup, rub or click Lungs:clear without rales, rhonchi, or wheezes AJO:INOM, non tender, + BS, do not palpate liver spleen or masses Ext:no lower ext edema, 2+ pedal pulses, 2+ radial pulses Neuro:alert and oriented, MAE, follows commands, + facial symmetry Tele: sr  Lab Results:  Recent Labs  03/21/15 0640 03/22/15 0405  WBC 8.0 6.5  HGB 15.8 14.9  HCT 45.0 44.3  PLT 187 175   BMET  Recent Labs  03/22/15 0405 03/23/15 0325  NA 135 135  K 4.0 3.6  CL 100* 100*  CO2 26 26  GLUCOSE 108* 103*  BUN 8 10  CREATININE 0.91 0.99  CALCIUM 8.9 8.8*    Recent Labs  03/21/15 0952 03/21/15 1539  TROPONINI 1.96* 12.80*    Lab Results  Component Value Date   CHOL 274*  03/22/2015   HDL 37* 03/22/2015   LDLCALC 195* 03/22/2015   TRIG 209* 03/22/2015   CHOLHDL 7.4 03/22/2015   Lab Results  Component Value Date   HGBA1C 6.1* 03/22/2015     No results found for: TSH  Hepatic Function Panel  Recent Labs  03/22/15 0405 03/23/15 0325  PROT 6.3* 6.7  ALBUMIN 3.6 3.7  AST 140* 96*  ALT 35 32  ALKPHOS 95 97  BILITOT 0.6 0.5  BILIDIR 0.1  --   IBILI 0.5  --     Recent Labs  03/22/15 0405  CHOL 274*   No results for input(s): PROTIME in the last 72 hours.     Studies/Results: ECHO: Left ventricle: The cavity size was normal. There was moderate focal basal hypertrophy of the septum. Systolic function was normal. The estimated ejection fraction was in the range of 50% to 55%. Definity contrast was administered - there is clear distal anterior, apical and inferoapical severe hypokinesis to dyskinesis. No thrombus was noted. The LV base is hyperdynamic. Doppler parameters are consistent with abnormal left ventricular relaxation (grade 1 diastolic dysfunction). The E/e&' ratio is between 8-15, suggesting indeterminate LV filing pressure. - Mitral valve: Mildly thickened leaflets . There was trivial regurgitation. - Left atrium: The atrium was normal in size.  Impressions:  - LVEF 50-55%, hyperdynamic base with distal anterior, apical and inferoapical severe hypokinesis to dyskinesis, diastolic dysfunction, indeterminate LV filling pressure, normal LA size.  Medications: I have reviewed the patient's current medications. Scheduled Meds: . aspirin EC  81 mg Oral Daily  . atorvastatin  80 mg Oral q1800  . carbamazepine  1,200 mg Oral QPM  . carbamazepine  800 mg Oral Daily  . carvedilol  3.125 mg Oral BID WC  . citalopram  40 mg Oral q morning - 10a  . clonazePAM  0.5 mg Oral BID  . clopidogrel  75 mg Oral Q breakfast  . lamoTRIgine  100 mg Oral BID  . levETIRAcetam  500 mg Oral BID  . nicotine  21 mg  Transdermal Daily  . pantoprazole  80 mg Oral BH-q7a   Continuous Infusions:  PRN Meds:.acetaminophen, ALPRAZolam, gi cocktail, isometheptene-acetaminophen-dichloralphenazone, morphine injection, ondansetron (ZOFRAN) IV, oxyCODONE-acetaminophen  Assessment/Plan: 1. Acute NSTEMI/CAD: S/p pci/DES to the LAD 03/21/15. No c/p overnight though he was dyspneic with ambulation to the bathroom. Residual, diffuse, 75% distal LAD dzs with plan for medical mgmt. Ambulate today. Check echo to eval EF - 35-45% on LV gram. Cont asa, plavix, bb, high potency statin. In setting of LV dysfxn, will add low dose acei.  2. Ischemic cardiomyopathy: EF 35-45% by LV gram yesterday. by echo EF 50-55%. Dyspneic with ambulation. Difficult to gauge volume on exam - no obvious overload, however EDP 6mmHg on cath yesterday. Will give a dose of IV lasix. Switch bb to coreg. Will plan to add low dose acei but want to see how his BP tolerates coreg first. Renal fxn stable.  3. Lipids: Lipids, LDL 195, HDL 37, TC 274  TG 209 Cont high potency statin.  4. Tob Abuse: Complete cessation advised. Pt says he will quit.  5. Morbid Obesity: Will benefit from cardiac rehab and nutrition counseling.  6. Hyperglycemia: HgbA1C 6.1 - follow up with PCP, low carb diet.  7. OSA: Uses CPAP.  8. H/o brain tumor/Sz d/o: Cont home meds. Per NSU - ok to cont DAPT x 12 mos.   LOS: 2 days   Time spent with pt. :15 minutes. [facility] R  Nurse Practitioner Certified Pager [phone] or after 5pm and on weekends call [phone] 03/23/2015, 8:22 AM  '

## 2015-03-23 NOTE — Progress Notes (Signed)
Inpatient Diabetes Program Recommendations  AACE/ADA: New Consensus Statement on Inpatient Glycemic Control  Target Ranges:  Prepandial:   less than 140 mg/dL      Peak postprandial:   less than 180 mg/dL (1-2 hours)      Critically ill patients:  140 - 180 mg/dL  Pager:  [phone]    Reason for Visit: HbgA1c 6.1%-  Pre-Diabetes  Spoke with patient about his current A1C and the need to prevent T2DM.  Discussed eating consistently and 60 gm with each meal.  Also discussed possible need for Metformin as well.  Ordered OPED and discussed the need for glucometer to check glucose intermittently.   Courtney Heys PhD, RN, BC-ADM Diabetes Coordinator  Office:  [phone] Team Pager:  [phone]

## 2015-03-23 NOTE — Progress Notes (Signed)
CARDIAC REHAB PHASE I   PRE:  Rate/Rhythm: 90 SR  BP:  Supine:   Sitting: 118/51  Standing:    SaO2: 96%RA  MODE:  Ambulation: 800 ft   POST:  Rate/Rhythm: 105 ST  BP:  Supine:   Sitting: 118/76  Standing:    SaO2: 92-95%RA 0850-0940 Pt walked 800 ft with steady gait. Sats checked frequently during walk. Pt felt a little SOB at beginning of walk but felt he started out too fast. Less SOB at end of walk. Also stated because of his weight he is always a little dyspneic. Re enforced not smoking as pt has tried chantix, hypnosis, and has nicotine patch now. He stated he watched the smoking cessation video and would like to try just cutting down gradually. Has fake cigarette. Discussed with pt that AIC is 6.1 and how to watch carbs and to followup with MD. Discuss increasing ex which pt has had difficulty doing with the migraines. Think best resource for pt will be CRP 2 as pt is a little overwhelmed with so may changes needed. Gave emotional support.    Graylon Good, RN BSN  03/23/2015 9:36 AM

## 2015-03-23 NOTE — Discharge Instructions (Signed)
Take 1 NTG, under your tongue, while sitting.  If no relief of pain may repeat NTG, one tab every 5 minutes up to 3 tablets total over 15 minutes.  If no relief CALL 911.  If you have dizziness/lightheadness  while taking NTG, stop taking and call 911.        Heart healthy low carbohydrate diet, low salt diet also you are close to being diabetic, follow up with your primary MD.  Call [facility] at [phone] if any bleeding, swelling or drainage at cath site.  May shower, no tub baths for 48 hours for groin sticks. No lifting over 5 pounds for 7 days.  No Driving for 7 days  No work until seen back in the office.    Weigh daily Call [phone] if weight climbs more than 3 pounds in a day or 5 pounds in a week. No salt to very little salt in your diet.  No more than 2000 mg in a day. Call if increased shortness of breath or increased swelling.

## 2015-03-24 NOTE — Telephone Encounter (Signed)
Left message to call back  

## 2015-03-24 NOTE — Telephone Encounter (Signed)
Patient contacted regarding discharge from [facility] on Mar 23, 2015.  Patient understands to follow up with provider Richardson Dopp, PA on Mar 29, 2015 at 8:30AM at [facility]. Patient understands discharge instructions? yes Patient understands medications and regiment? yes Patient understands to bring all medications to this visit? yes  Pt verbalized great appreciation for Dr. Tamala Julian and Dr. Burt Knack and spoke highly of their bedside manner. Pt very pleased with care provided.

## 2015-03-28 DIAGNOSIS — E785 Hyperlipidemia, unspecified: Secondary | ICD-10-CM | POA: Insufficient documentation

## 2015-03-28 NOTE — Progress Notes (Signed)
Cardiology Office Note   Date:  03/29/2015   ID:  James Torres, DOB 1966/04/08, MRN [id number]  PCP:   Melinda Crutch, MD  Cardiologist:  Dr. Daneen Schick     Chief Complaint  Patient presents with  . Coronary Artery Disease     History of Present Illness: James Torres is a 49 y.o. male with a hx of a epidermoid brain tumor with recurrence with conservative management, OSA on CPAP, depression, GERD, seizure d/o. He was recently admitted 5/16-5/18 with an acute anterior NSTEMI.  LHC demonstrated an occluded mid LAD which was treated with PTCA, aspiration thrombectomy and a Synergy DES. The MI was difficult to dx due to collaterals to the LAD from the RCA.  EF was 35-40%.  Echo demonstrated an EF 50-55% with distal ant, apical and inf-apical severe HK to dyskinesis and mild diastolic dysfunction.    He returns for FU.   Since discharge, he denies further chest discomfort. He denies shortness of breath, orthopnea, PND, edema, syncope. He did have several days of exhaustion post discharge. This is resolved. He has noted diarrhea. He has had watery stools, 3-4 times a day. He denies bloody stools. He denies significant abdominal cramping. He denies fever.  Studies/Reports Reviewed Today:  Echo 03/22/15 - Moderate focal basal hypertrophy of the septum. EF 50 to 55%. Definity contrast was administered - there is clear distal anterior, apical and inferoapical severe hypokinesis to dyskinesis. No thrombus was noted. The LV base is hyperdynamic.  Grade 1 diastolic dysfunction - Mitral valve: Mildly thickened leaflets . There was trivial regurgitation. - Left atrium: The atrium was normal in size. Impressions: LVEF 50-55%, hyperdynamic base with distal anterior, apical and inferoapical severe hypokinesis to dyskinesis, diastolic dysfunction, indeterminate LV filling pressure, normal LA size.  LHC/PCI 03/21/15 1. Total occlusion of the mid-LAD treated successfully with PTCA, aspiration  thrombectomy, and a Synergy DES. 2. Moderate residual diffuse LAD stenosis 3. Moderate segmental LV systolic dysfunction, LVEF estimated at 40% LM:  Normal LAD:  Ostial 50%, mid 100%, dist 70% LCx:  irregs RCA:  Ostial 50% There is moderate left ventricular systolic dysfunction. The left ventricular ejection fraction is 35-45% by visual estimate. There are wall motion abnormalities in the left ventricle. There are segmental wall motion abnormalities in the left ventricle.   Past Medical History  Diagnosis Date  . Brain tumor   . Sleep apnea 2009    a. On CPAP.  [name] Anxiety   . Depression   . Kidney stone   . GERD (gastroesophageal reflux disease)   . Seizures     a. Has auras and metalic taste in mouth.  Last one 06/2014- last a few seconds, has one every couple weeks, Dr Mare Loan is neurologist and is aware.   . Migraines   . CAD (coronary artery disease)     a. 03/2015 NSTEMI: LM nl, LAD 50ost, 100p (2.75x38 Synergy DES), 75d, RI nl, LCX minor irregs, OM1 min irregs, RCA 50ost, EF 35-45%.  . Morbid obesity   . Hyperlipidemia   . Ischemic cardiomyopathy     a. 03/2015 EF 35-45% by LV gram @ time of NSTEMI;  b. 03/2015 Echo: EF 50-55%, sev HK to DK of dist an, apical, and infap walls.Gr 1 DD.  . Tobacco abuse   . Metabolic syndrome 06/22/5630    Past Surgical History  Procedure Laterality Date  . Brain surgery  2004    Crainotomy for tumor  . Knee arthroscopy Right  2005 approx    cartliage  . Wisdom tooth extraction    . Ulnar nerve transposition Left 06/25/2014    Procedure: LEFT ULNAR NEUROPLASTY AT ELBOW;  Surgeon: Jolyn Nap, MD;  Location: [facility];  Service: Orthopedics;  Laterality: Left;  . Cardiac catheterization N/A 03/21/2015    Procedure: Left Heart Cath and Coronary Angiography;  Surgeon: Sherren Mocha, MD;  Location: [facility];  Service: Cardiovascular;  Laterality: N/A;     Current Outpatient Prescriptions  Medication Sig Dispense Refill  .  acetaminophen (TYLENOL) 325 MG tablet Take 2 tablets (650 mg total) by mouth every 4 (four) hours as needed for headache or mild pain.    [name] ALPRAZolam (XANAX) 0.5 MG tablet Take 0.5 mg by mouth at bedtime as needed for sleep.     [name] aspirin EC 81 MG EC tablet Take 1 tablet (81 mg total) by mouth daily.    . carbamazepine (TEGRETOL XR) 400 MG 12 hr tablet Take 800-1,200 mg by mouth 2 (two) times daily. Takes 800mg  in the morning and 1200mg  in the evening    . carvedilol (COREG) 3.125 MG tablet Take 1 tablet (3.125 mg total) by mouth 2 (two) times daily with a meal. 60 tablet 6  . citalopram (CELEXA) 40 MG tablet Take 40 mg by mouth every morning.    . clonazePAM (KLONOPIN) 0.5 MG tablet Take 0.5 mg by mouth 2 (two) times daily.     . clopidogrel (PLAVIX) 75 MG tablet Take 1 tablet (75 mg total) by mouth daily with breakfast. 30 tablet 11  . lamoTRIgine (LAMICTAL) 100 MG tablet Take 100 mg by mouth 2 (two) times daily.    [name] levETIRAcetam (KEPPRA) 500 MG tablet Take 500 mg by mouth 2 (two) times daily.    [name] lisinopril (PRINIVIL,ZESTRIL) 2.5 MG tablet Take 1 tablet (2.5 mg total) by mouth daily. 30 tablet 6  . nicotine (NICODERM CQ - DOSED IN MG/24 HOURS) 21 mg/24hr patch Place 1 patch (21 mg total) onto the skin daily. 28 patch 0  . [START ON 04/07/2015] nicotine (NICODERM CQ) 14 mg/24hr patch Place 1 patch (14 mg total) onto the skin daily. 28 patch 1  . nitroGLYCERIN (NITROSTAT) 0.4 MG SL tablet Place 1 tablet (0.4 mg total) under the tongue every 5 (five) minutes as needed for chest pain. 25 tablet 4  . oxyCODONE-acetaminophen (ROXICET) 5-325 MG per tablet Take 1-2 tablets by mouth every 4 (four) hours as needed for severe pain. 40 tablet 0  . pantoprazole (PROTONIX) 40 MG tablet Take 2 tablets (80 mg total) by mouth every morning. 60 tablet 6  . famotidine (PEPCID) 20 MG tablet Take 1 tablet (20 mg total) by mouth every evening. 30 tablet 3  . rosuvastatin (CRESTOR) 40 MG tablet Take 1 tablet (40 mg  total) by mouth daily. 30 tablet 11   No current facility-administered medications for this visit.    Allergies:   Review of patient's allergies indicates no known allergies.    Social History:  The patient  reports that he has been smoking Cigarettes.  He has a 20 pack-year smoking history. He has never used smokeless tobacco. He reports that he does not drink alcohol or use illicit drugs.   Family History:  The patient's family history is negative for Heart attack and Stroke.    ROS:   Please see the history of present illness.   Review of Systems  HENT: Positive for headaches.   Gastrointestinal: Positive for diarrhea.  All other systems reviewed and are negative.    PHYSICAL EXAM: VS:  BP 112/65 mmHg  Pulse 78  Ht 6' (1.829 m)  Wt 367 lb (166.47 kg)  BMI 49.76 kg/m2    Wt Readings from Last 3 Encounters:  03/29/15 367 lb (166.47 kg)  03/23/15 376 lb 8.7 oz (170.8 kg)  12/15/14 365 lb (165.563 kg)     GEN: Well nourished, well developed, in no acute distress HEENT: normal Neck: no JVD, no carotid bruits, no masses Cardiac:  Normal S1/S2, RRR; no murmur ,  no rubs or gallops, no edema ; right wrist without hematoma or mass  Respiratory:  clear to auscultation bilaterally, no wheezing, rhonchi or rales. GI: soft, nontender, nondistended, + BS MS: no deformity or atrophy Skin: warm and dry  Neuro:  CNs II-XII intact, Strength and sensation are intact Psych: Normal affect   EKG:  EKG is ordered today.  It demonstrates:   NSR, HR 78, low voltage, poor R-wave progression, no change from prior tracing   Recent Labs: 03/22/2015: B Natriuretic Peptide 89.0 03/23/2015: ALT 32 03/29/2015: BUN 9; Creatinine 0.99; Hemoglobin 15.8; Platelets 209.0; Potassium 4.2; Sodium 133*    Lipid Panel    Component Value Date/Time   CHOL 274* 03/22/2015 0405   TRIG 209* 03/22/2015 0405   HDL 37* 03/22/2015 0405   CHOLHDL 7.4 03/22/2015 0405   VLDL 42* 03/22/2015 0405   LDLCALC  195* 03/22/2015 0405      ASSESSMENT AND PLAN:  Coronary artery disease involving native coronary artery of native heart without angina pectoris:  He is overall doing well without angina since discharge from the hospital with a non-STEMI and PCI with a DES to the LAD. Residual CAD will be treated medically. Continue aspirin, statin, beta blocker, ACE inhibitor, Plavix. Refer to cardiac rehabilitation. We discussed the importance of tobacco cessation. We also discussed the importance of exercise and weight loss.  Ischemic cardiomyopathy: Ejection fraction improved to normal prior to discharge. Continue beta blocker and ACE inhibitor. Check follow-up BMET today.  HLD (hyperlipidemia):  I suspect Lipitor may be a culprit for his diarrhea. Start Crestor 40 mg daily.  If diarrhea does not improve, switch back to Lipitor. Obtain lipids and LFTs in 6-8 weeks.  Tobacco abuse:  I have advised him to quit.   Hyperglycemia:   I have asked him to follow-up with primary care for further management.   Diarrhea - Plan: Stool C-Diff Toxin Assay, CBC w/Diff   GERD:  He had better results with Zegerid. I advised him to not take Zegerid as it does have omeprazole and he is now on Plavix plus aspirin in the setting of recent drug-eluting stent placement to his LAD. Therefore, he can continue on Protonix. He can add Pepcid 20 mg every evening to his medical regimen.  Current medicines are reviewed at length with the patient today.  Concerns regarding medicines are as outlined above.  The following changes have been made:    Stop Lipitor  Start Crestor 40 mg QD  Add Pepcid 20 mg QPM   Labs/ tests ordered today include:  Orders Placed This Encounter  Procedures  . Stool C-Diff Toxin Assay  . Basic Metabolic Panel (BMET)  . CBC w/Diff  . Lipid Profile  . Hepatic function panel  . EKG 12-Lead    Disposition:   FU with Dr. Daneen Schick 8 weeks.      Signed, Richardson Dopp, PA-C, MHS 03/29/2015 5:11  PM  [facility] [address], [place], [place]  [address] Phone: [phone]; Fax: [phone]

## 2015-03-29 ENCOUNTER — Ambulatory Visit (INDEPENDENT_AMBULATORY_CARE_PROVIDER_SITE_OTHER): Payer: Medicare PPO | Admitting: Physician Assistant

## 2015-03-29 ENCOUNTER — Encounter: Payer: Self-pay | Admitting: Physician Assistant

## 2015-03-29 ENCOUNTER — Other Ambulatory Visit: Payer: Self-pay | Admitting: Physician Assistant

## 2015-03-29 VITALS — BP 112/65 | HR 78 | Ht 72.0 in | Wt 367.0 lb

## 2015-03-29 DIAGNOSIS — I255 Ischemic cardiomyopathy: Secondary | ICD-10-CM

## 2015-03-29 DIAGNOSIS — E785 Hyperlipidemia, unspecified: Secondary | ICD-10-CM | POA: Diagnosis not present

## 2015-03-29 DIAGNOSIS — Z720 Tobacco use: Secondary | ICD-10-CM | POA: Diagnosis not present

## 2015-03-29 DIAGNOSIS — I2510 Atherosclerotic heart disease of native coronary artery without angina pectoris: Secondary | ICD-10-CM | POA: Diagnosis not present

## 2015-03-29 DIAGNOSIS — R739 Hyperglycemia, unspecified: Secondary | ICD-10-CM

## 2015-03-29 DIAGNOSIS — R197 Diarrhea, unspecified: Secondary | ICD-10-CM | POA: Diagnosis not present

## 2015-03-29 DIAGNOSIS — K219 Gastro-esophageal reflux disease without esophagitis: Secondary | ICD-10-CM

## 2015-03-29 LAB — CBC WITH DIFFERENTIAL/PLATELET
Basophils Absolute: 0 10*3/uL (ref 0.0–0.1)
Basophils Relative: 0.5 % (ref 0.0–3.0)
EOS ABS: 0.2 10*3/uL (ref 0.0–0.7)
Eosinophils Relative: 3.1 % (ref 0.0–5.0)
HCT: 45.1 % (ref 39.0–52.0)
Hemoglobin: 15.8 g/dL (ref 13.0–17.0)
Lymphocytes Relative: 21.5 % (ref 12.0–46.0)
Lymphs Abs: 1.4 10*3/uL (ref 0.7–4.0)
MCHC: 35 g/dL (ref 30.0–36.0)
MCV: 88.5 fl (ref 78.0–100.0)
MONOS PCT: 7.1 % (ref 3.0–12.0)
Monocytes Absolute: 0.5 10*3/uL (ref 0.1–1.0)
NEUTROS ABS: 4.4 10*3/uL (ref 1.4–7.7)
Neutrophils Relative %: 67.8 % (ref 43.0–77.0)
PLATELETS: 209 10*3/uL (ref 150.0–400.0)
RBC: 5.09 Mil/uL (ref 4.22–5.81)
RDW: 14 % (ref 11.5–15.5)
WBC: 6.4 10*3/uL (ref 4.0–10.5)

## 2015-03-29 LAB — BASIC METABOLIC PANEL
BUN: 9 mg/dL (ref 6–23)
CO2: 28 meq/L (ref 19–32)
Calcium: 9.4 mg/dL (ref 8.4–10.5)
Chloride: 100 mEq/L (ref 96–112)
Creatinine, Ser: 0.99 mg/dL (ref 0.40–1.50)
GFR: 84.72 mL/min (ref >60.00)
Glucose, Bld: 93 mg/dL (ref 70–99)
Potassium: 4.2 mEq/L (ref 3.5–5.1)
SODIUM: 133 meq/L — AB (ref 135–145)

## 2015-03-29 MED ORDER — FAMOTIDINE 20 MG PO TABS: 20.0000 mg | ORAL_TABLET | Freq: Every evening | ORAL | Status: DC

## 2015-03-29 MED ORDER — ROSUVASTATIN CALCIUM 40 MG PO TABS
40.0000 mg | ORAL_TABLET | Freq: Every day | ORAL | Status: DC
Start: 2015-03-29 — End: 2016-03-05

## 2015-03-29 MED ORDER — ROSUVASTATIN CALCIUM 40 MG PO TABS: 40.0000 mg | ORAL_TABLET | Freq: Every day | ORAL | Status: DC

## 2015-03-29 NOTE — Patient Instructions (Signed)
Medication Instructions:  1. STOP LIPITOR 2. START CRESTOR 40 MG 1 TABLET DAILY; IF YOU ARE ABLE TO TOLERATE AFTER A COUPLE OF WEEKS PLEASE CALL THE OFFICE SO THAT WE MAY SEND IN AN RX  3. START PEPCID 20 MG 1 TABLET EVERY EVENING  Labwork: 1. TODAY; BMET, CBC W/DIFF, C-DIFF TOXIN 2. FASTING LIPID AND LIVER PANEL TO BE DONE IN 6-8 WEEKS  Testing/Procedures: NONE  Follow-Up: 1. FOLLOW UP WITH DR. Tamala Julian IN 6-8 WEEKS  2. FOLLOW UP WITH YOUR PRIMARY CARE PHYSICIAN DUE TO ELEVATED BLOOD SUGAR  Any Other Special Instructions Will Be Listed Below (If Applicable). CARDIAC REHAB AT [facility]

## 2015-03-30 ENCOUNTER — Telehealth: Payer: Self-pay | Admitting: Physician Assistant

## 2015-03-30 NOTE — Telephone Encounter (Signed)
New message      Returning [name]'s call to get lab results

## 2015-03-30 NOTE — Telephone Encounter (Signed)
Ptcb and has been notified of lab results by phone with verbal understanding. Pt states he dropped off his stool sample to [facility] lab today.

## 2015-03-31 LAB — C. DIFFICILE GDH AND TOXIN A/B
C. DIFF TOXIN A/B: NOT DETECTED
C. difficile GDH: NOT DETECTED

## 2015-04-14 ENCOUNTER — Encounter (HOSPITAL_COMMUNITY)
Admission: RE | Admit: 2015-04-14 | Discharge: 2015-04-14 | Disposition: A | Discharge status: Home / Self Care | Payer: Medicare PPO | Source: Ambulatory Visit | Attending: Interventional Cardiology | Admitting: Interventional Cardiology

## 2015-04-14 DIAGNOSIS — I252 Old myocardial infarction: Secondary | ICD-10-CM | POA: Insufficient documentation

## 2015-04-14 DIAGNOSIS — Z955 Presence of coronary angioplasty implant and graft: Secondary | ICD-10-CM | POA: Insufficient documentation

## 2015-04-14 DIAGNOSIS — Z48812 Encounter for surgical aftercare following surgery on the circulatory system: Secondary | ICD-10-CM | POA: Insufficient documentation

## 2015-04-14 NOTE — Progress Notes (Signed)
One of the pt's goals was to quit smoking.  I asked him how it was going and he said that the patches seemed to make him want to smoke more and that he had not tried using the fake cigarette we gave him in the hospital.  We discussed using combination therapy to aid in the quitting process.  Talked about continuing to use the patches but to pair them with either nicotine gum or lozenges to help with short term effect to go along with the long acting patches. Alberteen Sam, MA, ACSM RCEP

## 2015-04-14 NOTE — Progress Notes (Signed)
Cardiac Rehab Medication Review by a Pharmacist  Does the patient  feel that his/her medications are working for him/her?  yes  Has the patient been experiencing any side effects to the medications prescribed?  Yes - constipation maybe due to crestor?  Does the patient measure his/her own blood pressure or blood glucose at home?  no   Does the patient have any problems obtaining medications due to transportation or finances?   no  Understanding of regimen: good Understanding of indications: fair Potential of compliance: excellent    Pharmacist comments: Patient reports only constipation issues with medications. Thinks this may be due to crestor. Will see neurologist tomorrow to discuss. Rates compliance as excellent and good understanding of medications. No other issues.   Elicia Lamp, PharmD Clinical Pharmacist - Resident Pager [phone] 04/14/2015 8:35 AM

## 2015-04-18 ENCOUNTER — Encounter (HOSPITAL_COMMUNITY)
Admission: RE | Admit: 2015-04-18 | Discharge: 2015-04-18 | Disposition: A | Discharge status: Home / Self Care | Payer: Medicare PPO | Source: Ambulatory Visit | Attending: Interventional Cardiology | Admitting: Interventional Cardiology

## 2015-04-18 DIAGNOSIS — Z48812 Encounter for surgical aftercare following surgery on the circulatory system: Secondary | ICD-10-CM | POA: Diagnosis not present

## 2015-04-18 DIAGNOSIS — Z955 Presence of coronary angioplasty implant and graft: Secondary | ICD-10-CM | POA: Diagnosis not present

## 2015-04-18 DIAGNOSIS — I252 Old myocardial infarction: Secondary | ICD-10-CM | POA: Diagnosis not present

## 2015-04-18 NOTE — Progress Notes (Signed)
Pt started cardiac rehab today.  Pt tolerated light exercise without difficulty. VSS, telemetry-NSR, asymptomatic.  Medication list reconciled.  Pt verbalized compliance with medications and denies barriers to compliance.  Pt does report recent change to his seizure regimen per neuro. Pt has not made the recommended changes until he clarifies the instructions.  Pt asked to bring the written instructions with him to next scheduled rehab session for med reconciliation.  Psychosocial assessment deferred until  next session to allow adequate time to discuss with patient.    Pt oriented to exercise equipment and routine.  Understanding verbalized.

## 2015-04-20 ENCOUNTER — Telehealth (HOSPITAL_COMMUNITY): Payer: Self-pay | Admitting: Family Medicine

## 2015-04-20 ENCOUNTER — Encounter (HOSPITAL_COMMUNITY): Payer: Medicare PPO

## 2015-04-20 ENCOUNTER — Telehealth (HOSPITAL_COMMUNITY): Payer: Self-pay | Admitting: Cardiac Rehabilitation

## 2015-04-20 NOTE — Telephone Encounter (Signed)
pc received from pt c/o migraine x2 days.  He will be absent from cardiac rehab today for this reason.  He has chronic migraines and is being treated by neurologist.  Pt plans to return to exercise on next scheduled session.

## 2015-04-20 NOTE — Telephone Encounter (Signed)
pc received from c/o migraine x 2 days and will be absent from cardiac rehab.   He has chronic migraines.  Pt

## 2015-04-22 ENCOUNTER — Encounter (HOSPITAL_COMMUNITY)
Admission: RE | Admit: 2015-04-22 | Discharge: 2015-04-22 | Disposition: A | Discharge status: Home / Self Care | Payer: Medicare PPO | Source: Ambulatory Visit | Attending: Interventional Cardiology | Admitting: Interventional Cardiology

## 2015-04-22 ENCOUNTER — Encounter (HOSPITAL_COMMUNITY): Payer: Self-pay

## 2015-04-22 DIAGNOSIS — Z48812 Encounter for surgical aftercare following surgery on the circulatory system: Secondary | ICD-10-CM | POA: Diagnosis not present

## 2015-04-22 NOTE — Progress Notes (Signed)
INITIAL PSYCHOSOCIAL ASSESSMENT  PHQ score-1.  Patient admits to feelings of depression as exhibited by withdrawal from social activity and sadness. Pt reports depression is exercerbated by his chronic migraine headaches.  Pt reports he gets very discouraged with this debilitating pain.   Patient feels supported by his finance.  Patient enjoys fishing although he is not able to do at this time.  Patient psychosocial assessment reveals patient shows fair coping skills with  positive outlook.  Chronic pain is a  barrier to rehab participation identified at this time.   Psychosocial needs identified at this time, include coping with chronic pain and depression which is being treated.  Interventions are  needed at this time. Smoking cessation counseling provided.  Pt instructed to call [phone].  Pt seems committed  to quitting. Pt is currently attempting to quit, starting 04/19/15 with decreased tobacco use and 04/23/15 as quit date.  Pt has noticed increased cravings at night so he changes his patch early to increase nicotine effects.  Pt cautioned against over nicotine use.   Pt offered emotional support and reassurance.   Offered emotional support and reassurance.  Will monitor and evaluate progress toward psychosocial goals.   Interventions:   stress management class   mindfulness and meditation class  Smoking cessation counseling   1:1 education:  Importance of handling stress in a healthy way discussed   cardiac rehab exercise/education individual treatment plan    Goals: Smoking cessation  improved management of  depression   help patient work towards returning to meaningful activities that improve patient's quality of life and are attainable with patient's cardiac disease and psychosocial situation

## 2015-04-25 ENCOUNTER — Encounter (HOSPITAL_COMMUNITY): Payer: Medicare PPO

## 2015-04-27 ENCOUNTER — Encounter (HOSPITAL_COMMUNITY)
Admission: RE | Admit: 2015-04-27 | Discharge: 2015-04-27 | Disposition: A | Discharge status: Home / Self Care | Payer: Medicare PPO | Source: Ambulatory Visit | Attending: Interventional Cardiology | Admitting: Interventional Cardiology

## 2015-04-27 NOTE — Progress Notes (Signed)
QUALITY OF LIFE SCORE REVIEW Pt overall quality of life score low.  Patient score lowest  in area of health/function, psychosocial.    Patient quality of life slightly altered by physical constraints which limits ability to perform as prior to recent cardiac illness, in addition to chronic migraines and seizures.  Pt migraine/seizure activity increased at this time due to medication adjustments.  Pt with long standing history of neurological disorder.  This affects his social life and prevents his ability to interact away from home.  Pt lives with his fiance who is supportive.   Offered emotional support and reassurance.  Will continue to monitor and intervene as necessary.

## 2015-04-27 NOTE — Progress Notes (Signed)
Pt arrived at cardiac rehab today reporting recent seizure activity over past several days. Pt is weaning keppra and increasing lamictal as directed by his neurologist Dr. Catalina Gravel.  Pt he has partial petit seizures which increase in activity during medication adjustments.  Seizures are accompanied by migraines.  Pt instructed to not participate in cardiac rehab until medication adjustments and increased risk for seizures completed.  Pt is saddened and tearful  to withhold cardiac rehab at this time.  Request for medical records sent to Dr. Catalina Gravel and Dr. Ellene Route.

## 2015-04-28 ENCOUNTER — Encounter: Payer: Medicare PPO | Attending: Family Medicine | Admitting: Dietician

## 2015-04-28 ENCOUNTER — Encounter: Payer: Self-pay | Admitting: Dietician

## 2015-04-28 VITALS — Ht 72.0 in | Wt 351.0 lb

## 2015-04-28 DIAGNOSIS — E785 Hyperlipidemia, unspecified: Secondary | ICD-10-CM | POA: Diagnosis not present

## 2015-04-28 DIAGNOSIS — Z713 Dietary counseling and surveillance: Secondary | ICD-10-CM | POA: Insufficient documentation

## 2015-04-28 DIAGNOSIS — R7303 Prediabetes: Secondary | ICD-10-CM

## 2015-04-28 DIAGNOSIS — R7309 Other abnormal glucose: Secondary | ICD-10-CM | POA: Insufficient documentation

## 2015-04-28 NOTE — Patient Instructions (Signed)
Great job in the changes that you have made! Continue to work with the RD at cardiac rehab and call or e mail me if you have questions. Exercise as the doctor recommends.  Work with cardiac rehab for goals.  Exercise helps reduce your blood sugar and controls your weight. Continue to work at smoking cessation. Ask your MD for help if needed.  (walk in the yard when you let the dog out, drive) Watch portion size of meat. Consider whole grains and increase your non starchy vegetable intake.   Consider 3-4 carb choices at each meal.  (45-60 grams per meal).

## 2015-04-28 NOTE — Progress Notes (Signed)
Medical Nutrition Therapy:  Appt start time: 0930 end time:  1100.   Assessment:  Primary concerns today: Patient is here today with his fiance.  He wants to learn about nutrition due to pre diabetes and MI 5 weeks ago.  Hx also includes Brain tumor with surgery in 2004 with resulting migraines and seizures.  He continues to smoke but has reduced from 1 1/2 to 1/2 pack per day.  Labs 03/22/15:   HgbA1C 6.1 with recent diagnosis of pre diabetes.  Chol 274, trig 209, HDL 37, LDL 195.   Patient lives with fiance.  He is on disability due to migraines.  Fiance does most of the shopping and cooking.  Patient was eating out most of the time (fast food) prior to the MI 5 weeks ago.  Patient states that he has lost about 25 lbs since the MI.  UBW prior to MI was 375 lbs.  They have diabetic meal books that they have started to follow and changed diet significantly.  Prior to MI he would skip breakfast, sometimes skip lunch and eat a large dinner and snack at night.  Preferred Learning Style:   No preference indicated   Learning Readiness:   Ready  Change in progress   MEDICATIONS: see list   DIETARY INTAKE: Artificial sweeteners trigger migrains. 24-hr recall:  B ( AM): all bran cereal with 1/2 cup 1% milk, fruit  Snk ( AM): none L ( PM): tuna with mayo on [employer] bread, fruit or leftovers Snk ( PM): fruit D ( PM): meat, vege, salad, fruit, starch Snk ( PM): fruit or milk with wheat thins Beverages: 1% milk, water, coffee with 2 sweet creamers, rare soda (regular, caffeine free)- he used to drink 2 16 oz bottles of soda per day prior to diagnosis.  Usual physical activity: Walking 2 times per day for 30 minutes but stopped due to increased temperature and med adjustments for seizure meds which increase seizures.  Patient to start cardiac rehab July 11 after seizure med adjustment complete.  Estimated energy needs: 1800 calories 200 g carbohydrates 113 g protein 60 g fat  Progress Towards  Goal(s):  In progress.   Nutritional Diagnosis:  NB-1.1 Food and nutrition-related knowledge deficit As related to balance of carbohydrate, protein, and fat.  As evidenced by patient report.    Intervention:  Nutrition counseling and diabetes education initiated. Discussed Carb Counting by food group as method of portion control, reading food labels, and benefits of increased activity. Also discussed basic physiology of Diabetes.  Also discussed information regarding cholesterol and overall healthy eating for diabetes, weight, and cardiac hx.  Great job in the changes that you have made! Continue to work with the RD at cardiac rehab and call or e mail me if you have questions. Exercise as the doctor recommends.  Work with cardiac rehab for goals.  Exercise helps reduce your blood sugar and controls your weight. Continue to work at smoking cessation. Ask your MD for help if needed.  (walk in the yard when you let the dog out, drive) Watch portion size of meat. Consider whole grains and increase your non starchy vegetable intake.   Consider 3-4 carb choices at each meal.  (45-60 grams per meal).  Teaching Method Utilized:  Visual Auditory Hands on  Handouts given during visit include:  Meal plan card  Label reading  Snack list  Lacinda Axon book- diabetic meals in 30 minutes  Various DM magazines  Barriers to learning/adherence to lifestyle change: none  Demonstrated degree of understanding via:  Teach Back   Monitoring/Evaluation:  Dietary intake, exercise, label reading, and body weight prn.

## 2015-04-29 ENCOUNTER — Ambulatory Visit (HOSPITAL_COMMUNITY): Payer: Self-pay | Admitting: Cardiac Rehabilitation

## 2015-04-29 ENCOUNTER — Encounter (HOSPITAL_COMMUNITY): Payer: Medicare PPO

## 2015-05-02 ENCOUNTER — Encounter (HOSPITAL_COMMUNITY): Payer: Medicare PPO

## 2015-05-04 ENCOUNTER — Encounter (HOSPITAL_COMMUNITY): Payer: Medicare PPO

## 2015-05-06 ENCOUNTER — Encounter (HOSPITAL_COMMUNITY): Payer: Medicare PPO

## 2015-05-11 ENCOUNTER — Encounter (HOSPITAL_COMMUNITY): Payer: Medicare PPO

## 2015-05-11 NOTE — Progress Notes (Signed)
Cardiology Office Note   Date:  05/12/2015   ID:  James Torres, DOB 1966-03-03, MRN [id number]  PCP:   Melinda Crutch, MD  Cardiologist:  Sinclair Grooms, MD   Chief Complaint  Patient presents with  . Coronary Artery Disease      History of Present Illness: James Torres is a 49 y.o. male who presents for coronary artery disease, brain tumor treated conservatively, hyperlipidemia, morbid obesity, and non-ST elevation myocardial infarction in May 2016.  Having angina responsive to NTG. Having terrible constipation. He has had no exertional discomfort. He is having migraines. He is not able to use Midrin    Past Medical History  Diagnosis Date  . Brain tumor   . Sleep apnea 2009    a. On CPAP.  [name] Anxiety   . Depression   . Kidney stone   . GERD (gastroesophageal reflux disease)   . Seizures     a. Has auras and metalic taste in mouth.  Last one 06/2014- last a few seconds, has one every couple weeks, Dr Mare Loan is neurologist and is aware.   . Migraines   . CAD (coronary artery disease)     a. 03/2015 NSTEMI: LM nl, LAD 50ost, 100p (2.75x38 Synergy DES), 75d, RI nl, LCX minor irregs, OM1 min irregs, RCA 50ost, EF 35-45%.  . Morbid obesity   . Hyperlipidemia   . Ischemic cardiomyopathy     a. 03/2015 EF 35-45% by LV gram @ time of NSTEMI;  b. 03/2015 Echo: EF 50-55%, sev HK to DK of dist an, apical, and infap walls.Gr 1 DD.  . Tobacco abuse   . Metabolic syndrome 0/62/3762  . Pre-diabetes     Past Surgical History  Procedure Laterality Date  . Brain surgery  2004    Crainotomy for tumor  . Knee arthroscopy Right 2005 approx    cartliage  . Wisdom tooth extraction    . Ulnar nerve transposition Left 06/25/2014    Procedure: LEFT ULNAR NEUROPLASTY AT ELBOW;  Surgeon: Jolyn Nap, MD;  Location: [facility];  Service: Orthopedics;  Laterality: Left;  . Cardiac catheterization N/A 03/21/2015    Procedure: Left Heart Cath and Coronary Angiography;  Surgeon: Sherren Mocha, MD;  Location: [facility];  Service: Cardiovascular;  Laterality: N/A;     Current Outpatient Prescriptions  Medication Sig Dispense Refill  . acetaminophen (TYLENOL) 325 MG tablet Take 2 tablets (650 mg total) by mouth every 4 (four) hours as needed for headache or mild pain.    [name] ALPRAZolam (XANAX) 0.5 MG tablet Take 0.5 mg by mouth 2 (two) times daily as needed for anxiety.    [name] aspirin EC 81 MG EC tablet Take 1 tablet (81 mg total) by mouth daily.    . carbamazepine (TEGRETOL XR) 400 MG 12 hr tablet Take 800-1,200 mg by mouth 2 (two) times daily. Takes 800mg  in the morning and 1200mg  in the evening    . carvedilol (COREG) 3.125 MG tablet Take 1 tablet (3.125 mg total) by mouth 2 (two) times daily with a meal. 60 tablet 6  . citalopram (CELEXA) 40 MG tablet Take 40 mg by mouth every morning.    . clonazePAM (KLONOPIN) 0.5 MG tablet Take 0.5 mg by mouth 2 (two) times daily.     . clopidogrel (PLAVIX) 75 MG tablet Take 1 tablet (75 mg total) by mouth daily with breakfast. 30 tablet 11  . famotidine (PEPCID) 20 MG tablet Take  1 tablet (20 mg total) by mouth every evening. 30 tablet 3  . lamoTRIgine (LAMICTAL) 100 MG tablet Take one (1) tablet (100 mg) by mouth every am and two (2) tablets (200 mg) by mouth every evening.    [name] lisinopril (PRINIVIL,ZESTRIL) 2.5 MG tablet Take 1 tablet (2.5 mg total) by mouth daily. 30 tablet 6  . nitroGLYCERIN (NITROSTAT) 0.4 MG SL tablet Place 1 tablet (0.4 mg total) under the tongue every 5 (five) minutes as needed for chest pain. 25 tablet 4  . oxyCODONE-acetaminophen (ROXICET) 5-325 MG per tablet Take 1-2 tablets by mouth every 4 (four) hours as needed for severe pain. 40 tablet 0  . pantoprazole (PROTONIX) 40 MG tablet Take 2 tablets (80 mg total) by mouth every morning. 60 tablet 6  . rosuvastatin (CRESTOR) 40 MG tablet Take 1 tablet (40 mg total) by mouth daily. 30 tablet 11   No current facility-administered medications for this visit.     Allergies:   Review of patient's allergies indicates no known allergies.    Social History:  The patient  reports that he has been smoking Cigarettes.  He has a 20 pack-year smoking history. He has never used smokeless tobacco. He reports that he does not drink alcohol or use illicit drugs.   Family History:  The patient's family history includes Healthy in his mother, sister, and sister; Heart disease in his father; Stroke in his father. There is no history of Heart attack.    ROS:  Please see the history of present illness.   Otherwise, review of systems are positive for headaches, diarrhea, and constipation. Diarrhea resolved after switching from Lipitor to Crestor. He now feels that constipation is related to Crestor..   All other systems are reviewed and negative.    PHYSICAL EXAM: VS:  BP 110/68 mmHg  Pulse 72  Ht 6' (1.829 m)  Wt 157.58 kg (347 lb 6.4 oz)  BMI 47.11 kg/m2  SpO2 95% , BMI Body mass index is 47.11 kg/(m^2). GEN: Well nourished, well developed, in no acute distressArea morbid obesity HEENT: normal Neck: no JVD, carotid bruits, or masses Cardiac: RRR; no murmurs, rubs, or gallops,no edema  Respiratory:  clear to auscultation bilaterally, normal work of breathing GI: soft, nontender, nondistended, + BS MS: no deformity or atrophy Skin: warm and dry, no rash Neuro:  Strength and sensation are intact Psych: euthymic mood, full affect   EKG:  EKG is not ordered today. The ekg ordered today demonstrates    Recent Labs: 03/22/2015: B Natriuretic Peptide 89.0 03/29/2015: BUN 9; Creatinine, Ser 0.99; Hemoglobin 15.8; Platelets 209.0; Potassium 4.2; Sodium 133* 05/12/2015: ALT 22    Lipid Panel    Component Value Date/Time   CHOL 174 05/12/2015 1019   TRIG 148.0 05/12/2015 1019   HDL 39.20 05/12/2015 1019   CHOLHDL 4 05/12/2015 1019   VLDL 29.6 05/12/2015 1019   LDLCALC 105* 05/12/2015 1019      Wt Readings from Last 3 Encounters:  05/12/15 157.58 kg  (347 lb 6.4 oz)  04/28/15 159.213 kg (351 lb)  04/14/15 161.1 kg (355 lb 2.6 oz)      Other studies Reviewed: Additional studies/ records that were reviewed today include: . Review of the above records demonstrates:    ASSESSMENT AND PLAN:  1. Coronary artery disease involving native coronary artery of native heart without angina pectoris Angina responsive to nitroglycerin. Discomfort is mild.  2. Chronic diastolic heart failure No evidence of volume overload  3. Tobacco abuse  Still attempting to discontinue smoking  4. HLD (hyperlipidemia) LDL cholesterol is now around 100 from an initial value of 195 when hospitalized  5. Old MI (myocardial infarction) LV function is normal    Current medicines are reviewed at length with the patient today.  The patient has concerns regarding medicines.  The following changes have been made:  1's Crestor is causing constipation.  Labs/ tests ordered today include:  No orders of the defined types were placed in this encounter.     Disposition:   FU with HS in 4 months  Signed, Sinclair Grooms, MD  05/12/2015 3:14 PM    [address], [place], [place]  [address] Phone: [phone]; Fax: [phone]

## 2015-05-12 ENCOUNTER — Encounter: Payer: Self-pay | Admitting: Interventional Cardiology

## 2015-05-12 ENCOUNTER — Other Ambulatory Visit (INDEPENDENT_AMBULATORY_CARE_PROVIDER_SITE_OTHER): Payer: Medicare PPO | Admitting: *Deleted

## 2015-05-12 ENCOUNTER — Ambulatory Visit (INDEPENDENT_AMBULATORY_CARE_PROVIDER_SITE_OTHER): Payer: Medicare PPO | Admitting: Interventional Cardiology

## 2015-05-12 VITALS — BP 110/68 | HR 72 | Ht 72.0 in | Wt 347.4 lb

## 2015-05-12 DIAGNOSIS — I252 Old myocardial infarction: Secondary | ICD-10-CM

## 2015-05-12 DIAGNOSIS — I5032 Chronic diastolic (congestive) heart failure: Secondary | ICD-10-CM | POA: Diagnosis not present

## 2015-05-12 DIAGNOSIS — I2510 Atherosclerotic heart disease of native coronary artery without angina pectoris: Secondary | ICD-10-CM | POA: Diagnosis not present

## 2015-05-12 DIAGNOSIS — E785 Hyperlipidemia, unspecified: Secondary | ICD-10-CM

## 2015-05-12 DIAGNOSIS — Z720 Tobacco use: Secondary | ICD-10-CM

## 2015-05-12 LAB — HEPATIC FUNCTION PANEL
ALT: 22 U/L (ref 0–53)
AST: 22 U/L (ref 0–37)
Albumin: 4.2 g/dL (ref 3.5–5.2)
Alkaline Phosphatase: 91 U/L (ref 39–117)
Bilirubin, Direct: 0.1 mg/dL (ref 0.0–0.3)
TOTAL PROTEIN: 7 g/dL (ref 6.0–8.3)
Total Bilirubin: 0.4 mg/dL (ref 0.2–1.2)

## 2015-05-12 LAB — LIPID PANEL
CHOL/HDL RATIO: 4
Cholesterol: 174 mg/dL (ref 0–200)
HDL: 39.2 mg/dL (ref >39.00)
LDL CALC: 105 mg/dL — AB (ref 0–99)
NONHDL: 134.8
TRIGLYCERIDES: 148 mg/dL (ref 0.0–149.0)
VLDL: 29.6 mg/dL (ref 0.0–40.0)

## 2015-05-12 NOTE — Patient Instructions (Signed)
Medication Instructions:  Your physician recommends that you continue on your current medications as directed. Please refer to the Current Medication list given to you today.  Labwork: Your physician recommends that you return for a FASTING LIPID and LIVER in 3-4 MONTHS--nothing to eat or drink after midnight, lab opens at 7:30 AM (need one week prior to appointment with Dr Tamala Julian)  Testing/Procedures: No new orders.   Follow-Up: Your physician wants you to follow-up in: 3-4 MONTHS with Dr Tamala Julian.  You will receive a reminder letter in the mail two months in advance. If you don't receive a letter, please call our office to schedule the follow-up appointment.   Any Other Special Instructions Will Be Listed Below (If Applicable).

## 2015-05-13 ENCOUNTER — Telehealth: Payer: Self-pay | Admitting: *Deleted

## 2015-05-13 ENCOUNTER — Encounter (HOSPITAL_COMMUNITY): Payer: Medicare PPO

## 2015-05-13 NOTE — Telephone Encounter (Signed)
Pt notified of lab results by phone with verbal understanding.  

## 2015-05-16 ENCOUNTER — Encounter (HOSPITAL_COMMUNITY): Payer: Medicare PPO

## 2015-05-18 ENCOUNTER — Ambulatory Visit: Payer: Medicare PPO | Admitting: Dietician

## 2015-05-18 ENCOUNTER — Encounter (HOSPITAL_COMMUNITY): Payer: Medicare PPO

## 2015-05-20 ENCOUNTER — Encounter (HOSPITAL_COMMUNITY): Payer: Medicare PPO

## 2015-05-23 ENCOUNTER — Encounter (HOSPITAL_COMMUNITY): Payer: Medicare PPO

## 2015-05-23 ENCOUNTER — Telehealth (HOSPITAL_COMMUNITY): Payer: Self-pay | Admitting: *Deleted

## 2015-05-25 ENCOUNTER — Encounter (HOSPITAL_COMMUNITY): Payer: Medicare PPO

## 2015-05-27 ENCOUNTER — Encounter (HOSPITAL_COMMUNITY): Payer: Medicare PPO

## 2015-05-30 ENCOUNTER — Encounter (HOSPITAL_COMMUNITY): Payer: Medicare PPO

## 2015-06-01 ENCOUNTER — Encounter (HOSPITAL_COMMUNITY): Payer: Medicare PPO

## 2015-06-03 ENCOUNTER — Encounter (HOSPITAL_COMMUNITY): Payer: Medicare PPO

## 2015-06-03 ENCOUNTER — Telehealth (HOSPITAL_COMMUNITY): Payer: Self-pay | Admitting: Cardiac Rehabilitation

## 2015-06-03 NOTE — Telephone Encounter (Signed)
pc received from pt.  He is ready to restart cardiac rehab program. Pt has completely stopped keppra and is taking lamectal 3mg .  Pt reports decrease in seizure and migraine activity.  Pt has neuro appt with Dr. Melton Alar scheduled 06/14/2015.  Pt scheduled for cardiac rehab orientation 06/16/15 with exercise to begin 06/20/15.

## 2015-06-06 ENCOUNTER — Encounter (HOSPITAL_COMMUNITY): Payer: Medicare PPO

## 2015-06-06 ENCOUNTER — Telehealth (HOSPITAL_COMMUNITY): Payer: Self-pay | Admitting: Cardiac Rehabilitation

## 2015-06-06 NOTE — Telephone Encounter (Signed)
(  late entry for 04/29/2015)  Pt will be discharged from cardiac rehab program at this time due to expected lengthy medical absence.  Pt will plan to restart rehab once medication doses are fully titrated and  seizure activity has stabilized.

## 2015-06-08 ENCOUNTER — Encounter (HOSPITAL_COMMUNITY): Payer: Medicare PPO

## 2015-06-08 ENCOUNTER — Telehealth: Payer: Self-pay

## 2015-06-08 NOTE — Telephone Encounter (Signed)
Signed [facility] cardiac rehab form placed in MR nurse fax box

## 2015-06-10 ENCOUNTER — Encounter (HOSPITAL_COMMUNITY): Payer: Medicare PPO

## 2015-06-13 ENCOUNTER — Encounter (HOSPITAL_COMMUNITY): Payer: Medicare PPO

## 2015-06-15 ENCOUNTER — Encounter (HOSPITAL_COMMUNITY): Payer: Medicare PPO

## 2015-06-16 ENCOUNTER — Encounter (HOSPITAL_COMMUNITY)
Admission: RE | Admit: 2015-06-16 | Discharge: 2015-06-16 | Disposition: A | Discharge status: Home / Self Care | Payer: Medicare PPO | Source: Ambulatory Visit | Attending: Interventional Cardiology | Admitting: Interventional Cardiology

## 2015-06-16 DIAGNOSIS — Z955 Presence of coronary angioplasty implant and graft: Secondary | ICD-10-CM | POA: Insufficient documentation

## 2015-06-16 DIAGNOSIS — Z48812 Encounter for surgical aftercare following surgery on the circulatory system: Secondary | ICD-10-CM | POA: Insufficient documentation

## 2015-06-16 DIAGNOSIS — I252 Old myocardial infarction: Secondary | ICD-10-CM | POA: Insufficient documentation

## 2015-06-16 NOTE — Progress Notes (Signed)
Cardiac Rehab Medication Review by a Pharmacist  Does the patient  feel that his/her medications are working for him/her?  yes  Has the patient been experiencing any side effects to the medications prescribed?  Yes, pt describes double vision with new Coreg and Lamictal. Pt's neurologist is working to try to determine better options  Does the patient measure his/her own blood pressure or blood glucose at home?  no , recommended pt get a BP monitor  Does the patient have any problems obtaining medications due to transportation or finances?   no  Understanding of regimen: good Understanding of indications: good Potential of compliance: good  Pharmacist comments: Pt is very pleasant and asks great questions. Keeps track of his medication regimen on his cell phone, including indications. Reminded pt of the importance of his ASA and Plavix for keeping his recent stent clear. He is actively trying to quit smoking. I added his OTC nicotine patch to his medication list. He feels like he has been experiencing some double vision since starting Lamictal and Coreg. Coreg does list blurred vision as a side effect, so it is possible it could be causing some double vision as well. Referred the pt back to his neurologist and cardiologist for this problem and told him to continue taking his medications until his doctors tell him otherwise.   James Torres, PharmD Clinical Pharmacy Resident Pager: [phone]  06/16/2015 8:34 AM

## 2015-06-17 ENCOUNTER — Encounter (HOSPITAL_COMMUNITY): Payer: Medicare PPO

## 2015-06-20 ENCOUNTER — Encounter (HOSPITAL_COMMUNITY)
Admission: RE | Admit: 2015-06-20 | Discharge: 2015-06-20 | Disposition: A | Discharge status: Home / Self Care | Payer: Medicare PPO | Source: Ambulatory Visit | Attending: Interventional Cardiology | Admitting: Interventional Cardiology

## 2015-06-20 ENCOUNTER — Encounter (HOSPITAL_COMMUNITY): Payer: Self-pay

## 2015-06-20 ENCOUNTER — Encounter (HOSPITAL_COMMUNITY): Payer: Medicare PPO

## 2015-06-20 DIAGNOSIS — Z48812 Encounter for surgical aftercare following surgery on the circulatory system: Secondary | ICD-10-CM | POA: Diagnosis present

## 2015-06-20 DIAGNOSIS — I252 Old myocardial infarction: Secondary | ICD-10-CM | POA: Diagnosis not present

## 2015-06-20 DIAGNOSIS — Z955 Presence of coronary angioplasty implant and graft: Secondary | ICD-10-CM | POA: Diagnosis not present

## 2015-06-20 NOTE — Progress Notes (Signed)
Pt started cardiac rehab today.  Pt tolerated light exercise without difficulty. VSS, telemetry-sinus, asymptomatic.  Medication list reconciled.  Pt verbalized compliance with medications and denies barriers to compliance. PSYCHOSOCIAL ASSESSMENT:  PHQ-5. Pt suffers from severe migraines which effect him physically for several days.  Pt then falls into depression from inability to do pleasurable activities.  Pt has not had seizure activity in 1 month, his medication adjustments a now titrated to desired doses.  Pt exhibits effective coping skills, hopeful outlook with supportive family. Pt lives with his fiance.   Pt admits he does not have hobbies he enjoys  due to the migraines limiting his ability to work with his hands and get out socially.    Unfortunately, pt does continue to smoke 1/2 pack per day in addition to wearing 21mg  Nicotine patch daily.  Pt cautioned against wearing patch and smoking at same time.  Smoking cessation counseling given. Pt instructed to call 1800quitnow. Pt admits his smoking is mostly out of routine habit versus desire for nicotine. Pt encouraged to find changes to daily routines that will make it easier to break the habit.  Pt cardiac rehab  goal is  to be able to walk to mailbox without dyspnea.  Pt encouraged to participate in cardiac rehab exercise and education activities in addition to personalized home exercise to increase ability to achieve these goals.   Pt long term cardiac rehab goal is continue weight loss.  pt reports he has lost 45 lbs since his stent and wishes to continue to lose more.  Pt encouraged to work with nutritionist and attend nutrition education classes.  Pt oriented to exercise equipment and routine.  Understanding verbalized.

## 2015-06-22 ENCOUNTER — Encounter (HOSPITAL_COMMUNITY)
Admission: RE | Admit: 2015-06-22 | Discharge: 2015-06-22 | Disposition: A | Discharge status: Home / Self Care | Payer: Medicare PPO | Source: Ambulatory Visit | Attending: Interventional Cardiology | Admitting: Interventional Cardiology

## 2015-06-22 ENCOUNTER — Encounter (HOSPITAL_COMMUNITY): Payer: Medicare PPO

## 2015-06-22 DIAGNOSIS — Z48812 Encounter for surgical aftercare following surgery on the circulatory system: Secondary | ICD-10-CM | POA: Diagnosis not present

## 2015-06-22 NOTE — Progress Notes (Signed)
Pt completed Quality of Life survey as a participant in Cardiac Rehab. Scores 21.0 or below are considered low. Pt score very low in several areas Overall 13.58, Health and Function 8.87, physiological and spiritual 8.57,  Socioeconomic-20.14.  Pt denies chest pain or dyspnea presently. Pt does have decreased energy strength and stamina.   Pt reports his main complaint is migraine headaches. The migraines are debilitating for several days.  This also leads to depression.  Pt feels depressed only on the days he feels poorly which an improvement for him.  Pt is currently on treatment for depression.  Pt is interested in pursuing hobbies however he feels limited by his migraines. Pt has currently changed his migraine treatments which is helping.  Pt is hopeful participation in cardiac rehab will increase his functional ability and help him be able to get more active socially.  Pt offered emotional support and reassurance.  Will continue to monitor.

## 2015-06-24 ENCOUNTER — Encounter (HOSPITAL_COMMUNITY): Payer: Medicare PPO

## 2015-06-24 ENCOUNTER — Telehealth (HOSPITAL_COMMUNITY): Payer: Self-pay | Admitting: *Deleted

## 2015-06-24 ENCOUNTER — Encounter (HOSPITAL_COMMUNITY)
Admission: RE | Admit: 2015-06-24 | Discharge: 2015-06-24 | Disposition: A | Discharge status: Home / Self Care | Payer: Medicare PPO | Source: Ambulatory Visit | Attending: Interventional Cardiology | Admitting: Interventional Cardiology

## 2015-06-24 NOTE — Progress Notes (Signed)
Patient reports having a migraine this afternoon. Mr James Torres rates his pain a 8-9 on a 1-10 scale. I recommended that Mr James Torres not exercise with a migraine. Exercise stopped. Blood pressure 142/80 heart rate 77. Dr Catalina Gravel the patient's neurologist office called and notified. No new orders received. Patient went home to rest and to take his pain medication. Exit blood pressure 104/60 heart rate 77. Will fax exercise flow sheets to Dr. Ovid Curd office for review.

## 2015-06-27 ENCOUNTER — Encounter (HOSPITAL_COMMUNITY): Payer: Medicare PPO

## 2015-06-29 ENCOUNTER — Encounter (HOSPITAL_COMMUNITY)
Admission: RE | Admit: 2015-06-29 | Discharge: 2015-06-29 | Disposition: A | Discharge status: Home / Self Care | Payer: Medicare PPO | Source: Ambulatory Visit | Attending: Interventional Cardiology | Admitting: Interventional Cardiology

## 2015-06-29 ENCOUNTER — Encounter (HOSPITAL_COMMUNITY): Payer: Medicare PPO

## 2015-06-29 DIAGNOSIS — Z48812 Encounter for surgical aftercare following surgery on the circulatory system: Secondary | ICD-10-CM | POA: Diagnosis not present

## 2015-07-01 ENCOUNTER — Encounter (HOSPITAL_COMMUNITY): Payer: Medicare PPO

## 2015-07-01 ENCOUNTER — Encounter (HOSPITAL_COMMUNITY)
Admission: RE | Admit: 2015-07-01 | Discharge: 2015-07-01 | Disposition: A | Discharge status: Home / Self Care | Payer: Medicare PPO | Source: Ambulatory Visit | Attending: Interventional Cardiology | Admitting: Interventional Cardiology

## 2015-07-01 DIAGNOSIS — Z48812 Encounter for surgical aftercare following surgery on the circulatory system: Secondary | ICD-10-CM | POA: Diagnosis not present

## 2015-07-04 ENCOUNTER — Encounter (HOSPITAL_COMMUNITY)
Admission: RE | Admit: 2015-07-04 | Discharge: 2015-07-04 | Disposition: A | Discharge status: Home / Self Care | Payer: Medicare PPO | Source: Ambulatory Visit | Attending: Interventional Cardiology | Admitting: Interventional Cardiology

## 2015-07-04 ENCOUNTER — Encounter (HOSPITAL_COMMUNITY): Payer: Medicare PPO

## 2015-07-04 DIAGNOSIS — Z48812 Encounter for surgical aftercare following surgery on the circulatory system: Secondary | ICD-10-CM | POA: Diagnosis not present

## 2015-07-06 ENCOUNTER — Encounter (HOSPITAL_COMMUNITY)
Admission: RE | Admit: 2015-07-06 | Discharge: 2015-07-06 | Disposition: A | Discharge status: Home / Self Care | Payer: Medicare PPO | Source: Ambulatory Visit | Attending: Interventional Cardiology | Admitting: Interventional Cardiology

## 2015-07-06 ENCOUNTER — Encounter (HOSPITAL_COMMUNITY): Payer: Medicare PPO

## 2015-07-06 DIAGNOSIS — Z48812 Encounter for surgical aftercare following surgery on the circulatory system: Secondary | ICD-10-CM | POA: Diagnosis not present

## 2015-07-06 NOTE — Progress Notes (Signed)
James Torres 49 y.o. male Nutrition Note Spoke with pt. Nutrition Plan and Nutrition Survey goals reviewed with pt. Pt is following Step 2 of the Therapeutic Lifestyle Changes diet. Pt wants to lose wt. Pt has been trying to lose wt by "following a diabetic diet." Pt reports he has lost 46 lb over the past 3.5 months. Rate of wt loss exceeds desired rate for safe wt loss. Wt loss tips reviewed. Encouraged pt to discuss gastric bypass surgery with his PCP given pt meets criteria for surgery. Pt is prediabetic and is aware of diagnosis. Pt expressed understanding of the information reviewed. Pt aware of nutrition education classes offered and plans on attending nutrition classes. Lab Results  Component Value Date   HGBA1C 6.1* 03/22/2015    Nutrition Diagnosis ? Food-and nutrition-related knowledge deficit related to lack of exposure to information as related to diagnosis of: ? CVD ? Pre-DM ? Obesity related to excessive energy intake as evidenced by a BMI of 48  Nutrition RX/ Estimated Daily Nutrition Needs for: wt loss 2500-3000 Kcal, 70-80 gm fat, 16-20 gm sat fat, 2.5-3.0 gm trans-fat, <1500 mg sodium  Nutrition Intervention ? Pt's individual nutrition plan reviewed with pt. ? Benefits of adopting Therapeutic Lifestyle Changes discussed when Medficts reviewed. ? Pt to attend the Portion Distortion class ? Pt to attend the Diabetes Q & A class ? Pt to attend the   ? Nutrition I class                     ? Nutrition II class   ? Pt given handouts for: ? Nutrition I class ? Nutrition II class  ? Continue client-centered nutrition education by RD, as part of interdisciplinary care. Goal(s) ? Pt to identify food quantities necessary to achieve: ? wt loss to a goal wt of 330-348 lb (150.2-158.4 kg) at graduation from cardiac rehab.  ? Pt able to name foods that affect blood glucose  Monitor and Evaluate progress toward nutrition goal with team. Nutrition Risk: Change to Moderate Derek Mound, M.Ed, RD, LDN, CDE 07/06/2015 1:58 PM

## 2015-07-08 ENCOUNTER — Encounter (HOSPITAL_COMMUNITY)
Admission: RE | Admit: 2015-07-08 | Discharge: 2015-07-08 | Disposition: A | Discharge status: Home / Self Care | Payer: Medicare PPO | Source: Ambulatory Visit | Attending: Interventional Cardiology | Admitting: Interventional Cardiology

## 2015-07-08 ENCOUNTER — Encounter (HOSPITAL_COMMUNITY): Payer: Medicare PPO

## 2015-07-08 DIAGNOSIS — I252 Old myocardial infarction: Secondary | ICD-10-CM | POA: Insufficient documentation

## 2015-07-08 DIAGNOSIS — Z48812 Encounter for surgical aftercare following surgery on the circulatory system: Secondary | ICD-10-CM | POA: Insufficient documentation

## 2015-07-08 DIAGNOSIS — Z955 Presence of coronary angioplasty implant and graft: Secondary | ICD-10-CM | POA: Insufficient documentation

## 2015-07-13 ENCOUNTER — Encounter (HOSPITAL_COMMUNITY): Payer: Medicare PPO

## 2015-07-13 ENCOUNTER — Encounter (HOSPITAL_COMMUNITY)
Admission: RE | Admit: 2015-07-13 | Discharge: 2015-07-13 | Disposition: A | Discharge status: Home / Self Care | Payer: Medicare PPO | Source: Ambulatory Visit | Attending: Interventional Cardiology | Admitting: Interventional Cardiology

## 2015-07-13 DIAGNOSIS — Z48812 Encounter for surgical aftercare following surgery on the circulatory system: Secondary | ICD-10-CM | POA: Diagnosis not present

## 2015-07-15 ENCOUNTER — Telehealth (HOSPITAL_COMMUNITY): Payer: Self-pay | Admitting: Family Medicine

## 2015-07-15 ENCOUNTER — Encounter (HOSPITAL_COMMUNITY): Payer: Medicare PPO

## 2015-07-15 ENCOUNTER — Other Ambulatory Visit: Payer: Self-pay

## 2015-07-15 NOTE — Telephone Encounter (Signed)
Medication Detail      Disp Refills Start End     famotidine (PEPCID) 20 MG tablet 30 tablet 3 03/29/2015     Sig - Route: Take 1 tablet (20 mg total) by mouth every evening. - Oral    E-Prescribing Status: Receipt confirmed by pharmacy (03/29/2015 9:48 AM EDT)     Pharmacy    [facility], [place].

## 2015-07-18 ENCOUNTER — Encounter (HOSPITAL_COMMUNITY): Payer: Medicare PPO

## 2015-07-18 ENCOUNTER — Encounter (HOSPITAL_COMMUNITY)
Admission: RE | Admit: 2015-07-18 | Discharge: 2015-07-18 | Disposition: A | Discharge status: Home / Self Care | Payer: Medicare PPO | Source: Ambulatory Visit | Attending: Interventional Cardiology | Admitting: Interventional Cardiology

## 2015-07-18 DIAGNOSIS — Z48812 Encounter for surgical aftercare following surgery on the circulatory system: Secondary | ICD-10-CM | POA: Diagnosis not present

## 2015-07-18 NOTE — Progress Notes (Signed)
Patient reported he was beginning to experience a migraine at cool down  During exercise at cardiac rehab. Vital signs stable. Patient's neurologist's office called and notified. Patient left early and  Called to report he made it home safely. Will continue to monitor the patient throughout  the program.

## 2015-07-20 ENCOUNTER — Telehealth (HOSPITAL_COMMUNITY): Payer: Self-pay | Admitting: Family Medicine

## 2015-07-20 ENCOUNTER — Telehealth (HOSPITAL_COMMUNITY): Payer: Self-pay | Admitting: Cardiac Rehabilitation

## 2015-07-20 ENCOUNTER — Encounter (HOSPITAL_COMMUNITY): Payer: Medicare PPO

## 2015-07-20 NOTE — Telephone Encounter (Signed)
Phone message received from pt he will be absent from cardiac rehab today due to migraine headache past 2 days.  No answer.  Lmom.

## 2015-07-22 ENCOUNTER — Encounter (HOSPITAL_COMMUNITY)
Admission: RE | Admit: 2015-07-22 | Discharge: 2015-07-22 | Disposition: A | Discharge status: Home / Self Care | Payer: Medicare PPO | Source: Ambulatory Visit | Attending: Interventional Cardiology | Admitting: Interventional Cardiology

## 2015-07-22 ENCOUNTER — Encounter (HOSPITAL_COMMUNITY): Payer: Medicare PPO

## 2015-07-22 DIAGNOSIS — Z48812 Encounter for surgical aftercare following surgery on the circulatory system: Secondary | ICD-10-CM | POA: Diagnosis not present

## 2015-07-25 ENCOUNTER — Encounter (HOSPITAL_COMMUNITY)
Admission: RE | Admit: 2015-07-25 | Discharge: 2015-07-25 | Disposition: A | Discharge status: Home / Self Care | Payer: Medicare PPO | Source: Ambulatory Visit | Attending: Interventional Cardiology | Admitting: Interventional Cardiology

## 2015-07-25 DIAGNOSIS — Z48812 Encounter for surgical aftercare following surgery on the circulatory system: Secondary | ICD-10-CM | POA: Diagnosis not present

## 2015-07-25 NOTE — Progress Notes (Signed)
Pt arrived at cardiac rehab reporting medication dosage change.  Pt has increased lamictal 2.5 tabs qHS per his neurologist Dr. Melton Alar.  Medication list reconciled.

## 2015-07-26 ENCOUNTER — Other Ambulatory Visit: Payer: Self-pay

## 2015-07-26 MED ORDER — FAMOTIDINE 20 MG PO TABS: 20.0000 mg | ORAL_TABLET | Freq: Every evening | ORAL | Status: DC

## 2015-07-26 NOTE — Telephone Encounter (Signed)
Belva Crome, MD at 05/11/2015 3:18 PM  famotidine (PEPCID) 20 MG tabletTake 1 tablet (20 mg total) by mouth every evening Liliane Shi, PA-C at 03/28/2015 11:00 PM famotidine (PEPCID) 20 MG tabletTake 1 tablet (20 mg total) by mouth every evening Medication Instructions:  1. STOP LIPITOR 2. START CRESTOR 40 MG 1 TABLET DAILY; IF YOU ARE ABLE TO TOLERATE AFTER A COUPLE OF WEEKS PLEASE CALL THE OFFICE SO THAT WE MAY SEND IN AN RX  3. START PEPCID 20 MG 1 TABLET EVERY EVENING

## 2015-07-27 ENCOUNTER — Encounter (HOSPITAL_COMMUNITY): Admission: RE | Admit: 2015-07-27 | Payer: Medicare PPO | Source: Ambulatory Visit

## 2015-07-27 ENCOUNTER — Telehealth (HOSPITAL_COMMUNITY): Payer: Self-pay | Admitting: *Deleted

## 2015-07-29 ENCOUNTER — Telehealth (HOSPITAL_COMMUNITY): Payer: Self-pay | Admitting: Cardiac Rehabilitation

## 2015-07-29 ENCOUNTER — Encounter (HOSPITAL_COMMUNITY): Payer: Medicare PPO

## 2015-07-29 NOTE — Telephone Encounter (Signed)
Phone received from pt, he will be absent from cardiac rehab today due migraine.  Migraines are chronic condition for him. His next scheduled follow up with Dr. Melton Alar is 08/11/15.  Pt hopes to return to cardiac rehab next scheduled session.

## 2015-08-01 ENCOUNTER — Encounter (HOSPITAL_COMMUNITY)
Admission: RE | Admit: 2015-08-01 | Discharge: 2015-08-01 | Disposition: A | Discharge status: Home / Self Care | Payer: Medicare PPO | Source: Ambulatory Visit | Attending: Interventional Cardiology | Admitting: Interventional Cardiology

## 2015-08-01 DIAGNOSIS — Z48812 Encounter for surgical aftercare following surgery on the circulatory system: Secondary | ICD-10-CM | POA: Diagnosis not present

## 2015-08-03 ENCOUNTER — Encounter (HOSPITAL_COMMUNITY)
Admission: RE | Admit: 2015-08-03 | Discharge: 2015-08-03 | Disposition: A | Discharge status: Home / Self Care | Payer: Medicare PPO | Source: Ambulatory Visit | Attending: Interventional Cardiology | Admitting: Interventional Cardiology

## 2015-08-03 DIAGNOSIS — Z48812 Encounter for surgical aftercare following surgery on the circulatory system: Secondary | ICD-10-CM | POA: Diagnosis not present

## 2015-08-05 ENCOUNTER — Encounter (HOSPITAL_COMMUNITY): Admission: RE | Admit: 2015-08-05 | Payer: Medicare PPO | Source: Ambulatory Visit

## 2015-08-05 ENCOUNTER — Telehealth (HOSPITAL_COMMUNITY): Payer: Self-pay | Admitting: *Deleted

## 2015-08-08 ENCOUNTER — Encounter (HOSPITAL_COMMUNITY)
Admission: RE | Admit: 2015-08-08 | Discharge: 2015-08-08 | Disposition: A | Discharge status: Home / Self Care | Payer: Medicare PPO | Source: Ambulatory Visit | Attending: Interventional Cardiology | Admitting: Interventional Cardiology

## 2015-08-08 DIAGNOSIS — Z48812 Encounter for surgical aftercare following surgery on the circulatory system: Secondary | ICD-10-CM | POA: Insufficient documentation

## 2015-08-08 DIAGNOSIS — I252 Old myocardial infarction: Secondary | ICD-10-CM | POA: Insufficient documentation

## 2015-08-08 DIAGNOSIS — Z955 Presence of coronary angioplasty implant and graft: Secondary | ICD-10-CM | POA: Insufficient documentation

## 2015-08-10 ENCOUNTER — Encounter (HOSPITAL_COMMUNITY)
Admission: RE | Admit: 2015-08-10 | Discharge: 2015-08-10 | Disposition: A | Discharge status: Home / Self Care | Payer: Medicare PPO | Source: Ambulatory Visit | Attending: Interventional Cardiology | Admitting: Interventional Cardiology

## 2015-08-10 DIAGNOSIS — Z48812 Encounter for surgical aftercare following surgery on the circulatory system: Secondary | ICD-10-CM | POA: Diagnosis not present

## 2015-08-12 ENCOUNTER — Encounter (HOSPITAL_COMMUNITY): Payer: Medicare PPO

## 2015-08-12 ENCOUNTER — Telehealth (HOSPITAL_COMMUNITY): Payer: Self-pay | Admitting: Cardiac Rehabilitation

## 2015-08-12 NOTE — Telephone Encounter (Signed)
Phone message received from pt he will be absent from cardiac  rehab today due to migraine.

## 2015-08-15 ENCOUNTER — Telehealth (HOSPITAL_COMMUNITY): Payer: Self-pay | Admitting: Cardiac Rehabilitation

## 2015-08-15 ENCOUNTER — Encounter (HOSPITAL_COMMUNITY): Payer: Medicare PPO

## 2015-08-15 ENCOUNTER — Telehealth (HOSPITAL_COMMUNITY): Payer: Self-pay | Admitting: Family Medicine

## 2015-08-15 NOTE — Telephone Encounter (Signed)
pc received from pt absent from cardiac rehab today due to continued migraine.  Pt plans to return to rehab on next scheduled visit.

## 2015-08-16 ENCOUNTER — Telehealth (HOSPITAL_COMMUNITY): Payer: Self-pay | Admitting: Cardiac Rehabilitation

## 2015-08-16 NOTE — Telephone Encounter (Signed)
pc received from pt he has been absent from cardiac rehab due to migraine. Pt verbalizes concern about being discharged from cardiac rehab due to attendance policy. Pt reassured.  At pt request, insurance verification obtained to verify insurance time frame for cardiac rehab coverage. Per [employer], [employer] medicare policy has $185 deductible, [place] $4900 with [phone] met this calender year.  $15 copay per visit.  No visit limit, follows medicare guidelines criteria.  No preauthorization necessary.  Ref# [id number].  Pt made aware and verbalized understanding.

## 2015-08-17 ENCOUNTER — Encounter (HOSPITAL_COMMUNITY)
Admission: RE | Admit: 2015-08-17 | Discharge: 2015-08-17 | Disposition: A | Discharge status: Home / Self Care | Payer: Medicare PPO | Source: Ambulatory Visit | Attending: Interventional Cardiology | Admitting: Interventional Cardiology

## 2015-08-17 DIAGNOSIS — Z48812 Encounter for surgical aftercare following surgery on the circulatory system: Secondary | ICD-10-CM | POA: Diagnosis not present

## 2015-08-19 ENCOUNTER — Encounter (HOSPITAL_COMMUNITY): Payer: Medicare PPO

## 2015-08-22 ENCOUNTER — Encounter (HOSPITAL_COMMUNITY): Payer: Medicare PPO

## 2015-08-22 ENCOUNTER — Telehealth (HOSPITAL_COMMUNITY): Payer: Self-pay | Admitting: Family Medicine

## 2015-08-23 ENCOUNTER — Telehealth (HOSPITAL_COMMUNITY): Payer: Self-pay | Admitting: Cardiac Rehabilitation

## 2015-08-23 NOTE — Telephone Encounter (Signed)
pc received from pt absent from cardiac rehab 08/22/15 due to migraine

## 2015-08-24 ENCOUNTER — Telehealth (HOSPITAL_COMMUNITY): Payer: Self-pay | Admitting: Cardiac Rehabilitation

## 2015-08-24 ENCOUNTER — Encounter (HOSPITAL_COMMUNITY): Admission: RE | Admit: 2015-08-24 | Payer: Medicare PPO | Source: Ambulatory Visit

## 2015-08-24 NOTE — Telephone Encounter (Signed)
pc received from pt he is unable to attend cardiac rehab today due to migraine headache. Pt also c/o double vision off and on for 2 months.  Pt does have history of brain tumor with optic nerve involvement, however pt has discussed these symptoms Pt reports symptoms worse with increasing his lamectal dosage however his neurologist i

## 2015-08-24 NOTE — Telephone Encounter (Signed)
pc received from pt he will be absent from cardiac rehab today due to headache.  Pt also c/o double vision off and on x2 months. Pt with history of brain tumor has discussed this symptom with his neurologist and neurosurgeon with no indication of cause. Pt reports it has been suggested by his neurosurgeon the symptoms could be possibly caused by is cardiac medications.  Pt is unable to assess his blood pressure at home when symptoms occur.  Appointment scheduled with Cecilie Kicks, 08/31/15 at 2:30 to discuss these symptoms and his medications.  Pt advised to hold off from cardiac rehab until evaluated if symptoms return. Pt verbalized understanding.

## 2015-08-26 ENCOUNTER — Encounter (HOSPITAL_COMMUNITY): Payer: Medicare PPO

## 2015-08-29 ENCOUNTER — Encounter (HOSPITAL_COMMUNITY): Payer: Medicare PPO

## 2015-08-31 ENCOUNTER — Ambulatory Visit: Payer: Medicare PPO | Admitting: Cardiology

## 2015-08-31 ENCOUNTER — Encounter (HOSPITAL_COMMUNITY): Payer: Medicare PPO

## 2015-09-02 ENCOUNTER — Encounter (HOSPITAL_COMMUNITY): Payer: Medicare PPO

## 2015-09-05 ENCOUNTER — Encounter (HOSPITAL_COMMUNITY): Payer: Medicare PPO

## 2015-09-05 ENCOUNTER — Telehealth (HOSPITAL_COMMUNITY): Payer: Self-pay | Admitting: Cardiac Rehabilitation

## 2015-09-06 ENCOUNTER — Ambulatory Visit: Payer: Medicare PPO | Admitting: Cardiology

## 2015-09-06 ENCOUNTER — Telehealth: Payer: Self-pay | Admitting: Interventional Cardiology

## 2015-09-06 NOTE — Telephone Encounter (Signed)
A no show fee is at the discretion of the physician. Needs to be addressed by Dr. Tamala Julian.

## 2015-09-06 NOTE — Telephone Encounter (Signed)
Fwd to billing

## 2015-09-06 NOTE — Telephone Encounter (Signed)
New message    Patient calling does not want to be charge the $ 50.00 no show fee . Would like to discuss with the nurse

## 2015-09-07 ENCOUNTER — Encounter (HOSPITAL_COMMUNITY): Payer: Medicare PPO

## 2015-09-07 DIAGNOSIS — R569 Unspecified convulsions: Secondary | ICD-10-CM

## 2015-09-09 ENCOUNTER — Encounter (HOSPITAL_COMMUNITY): Payer: Medicare PPO

## 2015-09-12 ENCOUNTER — Encounter (HOSPITAL_COMMUNITY): Admission: RE | Admit: 2015-09-12 | Payer: Medicare PPO | Source: Ambulatory Visit

## 2015-09-12 NOTE — Progress Notes (Signed)
Cardiology Office Note   Date:  09/13/2015   ID:  James Torres, DOB [birth year], MRN [id number]  PCP:   Melinda Crutch, MD  Cardiologist:  Dr. Tamala Julian    Chief Complaint  Patient presents with  . double vision      History of Present Illness: James Torres is a 49 y.o. male who presents for CAD with hx.brain tumor with optic nerve involvement, treated previously with surgery but now residual tumor has increased in size and is treated conservatively.  He has hyperlipidemia, morbid obesity, and non-ST elevation myocardial infarction in May 2016.  Pt. now with double vision off and on for 2 months and migraine headache.  The double vision started when his lamictal was increased and then increased again.  Prior to meds being adjusted, he had no blurred vision.  He has missed 2 appointments because he could not drive with blurred vision.    He also has hx of migraines and seizures. He was asked by cardiac rehab to come to clear from cardiac perspective his blurred vision.  He has had an eye exam and it revealed need for glasses.  He has no chest pain and no SOB.  No lightheadedness.    He has a loss of 33lbs since discharge -he has been eating healthy and exercising with cardiac rehab.  Unfortunately his tobacco use has increased.  Now back up to 1ppd. Was down to 10 cigs after MI.  We discussed weaning himself off by finding 10 best cigarettes in a day and cutting back to those, then fater 1 month decreasing to best 5 cig. He thinks he may be able to do this.    Prior cath:  1. Total occlusion of the mid-LAD treated successfully with PTCA, aspiration thrombectomy, and a Synergy DES. 2. Moderate residual diffuse LAD stenosis 3. Moderate segmental LV systolic dysfunction, LVEF estimated at 40%  PLAN:   2D Echo  ASA/plavix x 12 months as tolerated (**review with neurology/neurosurgery**)  Aggressive risk reduction        Past Medical History  Diagnosis Date  . Brain tumor ([place])    . Sleep apnea 2009    a. On CPAP.  [name] Anxiety   . Depression   . Kidney stone   . GERD (gastroesophageal reflux disease)   . Seizures ([place])     a. Has auras and metalic taste in mouth.  Last one 06/2014- last a few seconds, has one every couple weeks, Dr Mare Loan is neurologist and is aware.   . Migraines   . CAD (coronary artery disease)     a. 03/2015 NSTEMI: LM nl, LAD 50ost, 100p (2.75x38 Synergy DES), 75d, RI nl, LCX minor irregs, OM1 min irregs, RCA 50ost, EF 35-45%.  . Morbid obesity ([place])   . Hyperlipidemia   . Ischemic cardiomyopathy     a. 03/2015 EF 35-45% by LV gram @ time of NSTEMI;  b. 03/2015 Echo: EF 50-55%, sev HK to DK of dist an, apical, and infap walls.Gr 1 DD.  . Tobacco abuse   . Metabolic syndrome 11/23/4172  . Pre-diabetes     Past Surgical History  Procedure Laterality Date  . Brain surgery  2004    Crainotomy for tumor  . Knee arthroscopy Right 2005 approx    cartliage  . Wisdom tooth extraction    . Ulnar nerve transposition Left 06/25/2014    Procedure: LEFT ULNAR NEUROPLASTY AT ELBOW;  Surgeon: Jolyn Nap, MD;  Location: [facility];  Service: Orthopedics;  Laterality: Left;  . Cardiac catheterization N/A 03/21/2015    Procedure: Left Heart Cath and Coronary Angiography;  Surgeon: Sherren Mocha, MD;  Location: [facility];  Service: Cardiovascular;  Laterality: N/A;     Current Outpatient Prescriptions  Medication Sig Dispense Refill  . ALPRAZolam (XANAX) 0.5 MG tablet Take 0.5 mg by mouth 2 (two) times daily as needed for anxiety. Only needs to take rarely    . aspirin EC 81 MG EC tablet Take 1 tablet (81 mg total) by mouth daily.    . carbamazepine (TEGRETOL XR) 400 MG 12 hr tablet Take 800-1,200 mg by mouth 2 (two) times daily. Takes 800mg  in the morning and 1200mg  in the evening    . carvedilol (COREG) 3.125 MG tablet Take 1 tablet (3.125 mg total) by mouth 2 (two) times daily with a meal. 60 tablet 6  . citalopram (CELEXA) 40 MG tablet  Take 40 mg by mouth every morning.    . clonazePAM (KLONOPIN) 0.5 MG tablet Take 0.5 mg by mouth 2 (two) times daily.     . clopidogrel (PLAVIX) 75 MG tablet Take 1 tablet (75 mg total) by mouth daily with breakfast. 30 tablet 11  . famotidine (PEPCID) 20 MG tablet Take 1 tablet (20 mg total) by mouth every evening. 90 tablet 1  . lamoTRIgine (LAMICTAL) 100 MG tablet Take 300 mg by mouth daily. Take one pill in the morning, 2 pills in the evening    . levETIRAcetam (KEPPRA XR) 500 MG 24 hr tablet Take 1,000 mg by mouth every evening.  0  . lisinopril (PRINIVIL,ZESTRIL) 2.5 MG tablet Take 1 tablet (2.5 mg total) by mouth daily. 30 tablet 6  . nicotine (NICODERM CQ - DOSED IN MG/24 HOURS) 21 mg/24hr patch Place 21 mg onto the skin daily.    . nitroGLYCERIN (NITROSTAT) 0.4 MG SL tablet Place 0.4 mg under the tongue every 5 (five) minutes as needed for chest pain (x 3 pills daily).    [name] oxyCODONE-acetaminophen (ROXICET) 5-325 MG per tablet Take 1-2 tablets by mouth every 4 (four) hours as needed for severe pain. 40 tablet 0  . pantoprazole (PROTONIX) 40 MG tablet Take 2 tablets (80 mg total) by mouth every morning. 60 tablet 6  . rosuvastatin (CRESTOR) 40 MG tablet Take 1 tablet (40 mg total) by mouth daily. 30 tablet 11   No current facility-administered medications for this visit.    Allergies:   Review of patient's allergies indicates no known allergies.    Social History:  The patient  reports that he has been smoking Cigarettes.  He has a 10 pack-year smoking history. He has never used smokeless tobacco. He reports that he does not drink alcohol or use illicit drugs.   Family History:  The patient's family history includes Healthy in his mother, sister, and sister; Heart disease in his father; Stroke in his father. There is no history of Heart attack.    ROS:  General:no colds or fevers,  weight down 33lbs. Skin:no rashes or ulcers HEENT:+ double vision, no congestion CV:see HPI PUL:see  HPI GI:no diarrhea + constipation no melena, no indigestion GU:no hematuria, no dysuria MS:no joint pain, no claudication Neuro:no syncope, no lightheadedness Endo:no diabetes-improved HGBA1c to 5.5 , no thyroid disease  Wt Readings from Last 3 Encounters:  09/13/15 318 lb 12.8 oz (144.607 kg)  06/16/15 335 lb 5.1 oz (152.1 kg)  05/12/15 347 lb 6.4 oz (157.58 kg)     PHYSICAL EXAM:  VS:  BP 102/72 mmHg  Pulse 71  Ht 6' 0.25" (1.835 m)  Wt 318 lb 12.8 oz (144.607 kg)  BMI 42.95 kg/m2 , BMI Body mass index is 42.95 kg/(m^2). General:Pleasant affect, NAD Skin:Warm and dry, brisk capillary refill HEENT:normocephalic, sclera clear, mucus membranes moist Neck:supple, no JVD, no bruits  Heart:S1S2 RRR without murmur, gallup, rub or click Lungs:clear without rales, rhonchi, or wheezes BPZ:WCHEN, soft, non tender, + BS, do not palpate liver spleen or masses Ext:no lower ext edema, 2+ pedal pulses, 2+ radial pulses Neuro:alert and oriented X 3, MAE, follows commands, + facial symmetry    EKG:  EKG is ordered today. The ekg ordered today demonstrates SR na acute changes from May 2016.    Recent Labs: 03/22/2015: B Natriuretic Peptide 89.0 03/29/2015: BUN 9; Creatinine, Ser 0.99; Hemoglobin 15.8; Platelets 209.0; Potassium 4.2; Sodium 133* 05/12/2015: ALT 22    Lipid Panel    Component Value Date/Time   CHOL 174 05/12/2015 1019   TRIG 148.0 05/12/2015 1019   HDL 39.20 05/12/2015 1019   CHOLHDL 4 05/12/2015 1019   VLDL 29.6 05/12/2015 1019   LDLCALC 105* 05/12/2015 1019       Other studies Reviewed: Additional studies/ records that were reviewed today include: ECHO with EF 50-55%. Study Conclusions  - Left ventricle: The cavity size was normal. There was moderate focal basal hypertrophy of the septum. Systolic function was normal. The estimated ejection fraction was in the range of 50% to 55%. Definity contrast was administered - there is clear distal anterior,  apical and inferoapical severe hypokinesis to dyskinesis. No thrombus was noted. The LV base is hyperdynamic. Doppler parameters are consistent with abnormal left ventricular relaxation (grade 1 diastolic dysfunction). The E/e&' ratio is between 8-15, suggesting indeterminate LV filing pressure. - Mitral valve: Mildly thickened leaflets . There was trivial regurgitation. - Left atrium: The atrium was normal in size.  Impressions:  - LVEF 50-55%, hyperdynamic base with distal anterior, apical and inferoapical severe hypokinesis to dyskinesis, diastolic dysfunction, indeterminate LV filling pressure, normal LA size.   ASSESSMENT AND PLAN:  1.  Double vision associated with medications.  nuerology has now adjusted meds so will follow.  His Lamictal had been increased and he developed double vision, now on decreased dose.    2. CAD with hx NSTEMI and stent. No further chest pain. No SOB--ok to return to cardiac rehab. He will keep follow up with Dr. Tamala Julian. 3. LV dysfunction now improved. No room to increase ACE or BB due to soft BP.  4 hyperlipidemia will check lipids and hepatic today.  5. Tobacco use, back up to 1 ppd, have discussed decreasing to 10 best per day for 1 month then best 5, then continue to decrease.   5. Obesity has lost 33 lbs.    Current medicines are reviewed with the patient today.  The patient Has no concerns regarding medicines.  The following changes have been made:  See above Labs/ tests ordered today include:see above  Disposition:   FU:  see above  Signed, Isaiah Serge, NP  09/13/2015 8:35 AM    [facility] [place], [place], [place], [place] Phone: [phone]; Fax: [phone]

## 2015-09-13 ENCOUNTER — Encounter: Payer: Self-pay | Admitting: Cardiology

## 2015-09-13 ENCOUNTER — Ambulatory Visit (INDEPENDENT_AMBULATORY_CARE_PROVIDER_SITE_OTHER): Payer: Medicare PPO | Admitting: Cardiology

## 2015-09-13 VITALS — BP 102/72 | HR 71 | Ht 72.25 in | Wt 318.8 lb

## 2015-09-13 DIAGNOSIS — I2510 Atherosclerotic heart disease of native coronary artery without angina pectoris: Secondary | ICD-10-CM | POA: Diagnosis not present

## 2015-09-13 DIAGNOSIS — E785 Hyperlipidemia, unspecified: Secondary | ICD-10-CM

## 2015-09-13 DIAGNOSIS — I5032 Chronic diastolic (congestive) heart failure: Secondary | ICD-10-CM

## 2015-09-13 DIAGNOSIS — H532 Diplopia: Secondary | ICD-10-CM | POA: Diagnosis not present

## 2015-09-13 DIAGNOSIS — Z720 Tobacco use: Secondary | ICD-10-CM

## 2015-09-13 LAB — HEPATIC FUNCTION PANEL
ALK PHOS: 103 U/L (ref 40–115)
ALT: 22 U/L (ref 9–46)
AST: 24 U/L (ref 10–35)
Albumin: 4.1 g/dL (ref 3.6–5.1)
BILIRUBIN INDIRECT: 0.3 mg/dL (ref 0.2–1.2)
Bilirubin, Direct: 0.2 mg/dL (ref <=0.2)
Total Bilirubin: 0.5 mg/dL (ref 0.2–1.2)
Total Protein: 7.4 g/dL (ref 6.1–8.1)

## 2015-09-13 LAB — LIPID PANEL
CHOLESTEROL: 154 mg/dL (ref 125–200)
HDL: 37 mg/dL — ABNORMAL LOW (ref >=40)
LDL CALC: 86 mg/dL (ref <130)
Total CHOL/HDL Ratio: 4.2 Ratio (ref <=5.0)
Triglycerides: 154 mg/dL — ABNORMAL HIGH (ref <150)
VLDL: 31 mg/dL — AB (ref <30)

## 2015-09-13 NOTE — Patient Instructions (Signed)
Medication Instructions:  Your physician recommends that you continue on your current medications as directed. Please refer to the Current Medication list given to you today.   Labwork: Lipid, Lft today  Testing/Procedures: None ordered  Follow-Up: Follow up as planned with Dr.Smith  Any Other Special Instructions Will Be Listed Below (If Applicable). Ok to resume [place] to try the following OTC for constipation: Colace, Miralax, Probiotic     If you need a refill on your cardiac medications before your next appointment, please call your pharmacy.

## 2015-09-14 ENCOUNTER — Encounter (HOSPITAL_COMMUNITY): Admission: RE | Admit: 2015-09-14 | Payer: Medicare PPO | Source: Ambulatory Visit

## 2015-09-14 ENCOUNTER — Telehealth: Payer: Self-pay

## 2015-09-14 DIAGNOSIS — E785 Hyperlipidemia, unspecified: Secondary | ICD-10-CM

## 2015-09-14 NOTE — Telephone Encounter (Signed)
Pt aware of lab results with verbal understanding. Cholesterol total and LDL are at target. Liver functions normal. Repeat study in 6 months. No change in therapy

## 2015-09-14 NOTE — Telephone Encounter (Signed)
F/u    Pt returning your call, please call back on cell.

## 2015-09-14 NOTE — Telephone Encounter (Signed)
Called to give pt lab results.lmtcb  

## 2015-09-16 ENCOUNTER — Other Ambulatory Visit: Payer: Medicare PPO

## 2015-09-16 ENCOUNTER — Encounter (HOSPITAL_COMMUNITY): Payer: Medicare PPO

## 2015-09-19 ENCOUNTER — Encounter (HOSPITAL_COMMUNITY): Payer: Medicare PPO

## 2015-09-20 ENCOUNTER — Encounter: Payer: Self-pay | Admitting: Interventional Cardiology

## 2015-09-20 ENCOUNTER — Telehealth (HOSPITAL_COMMUNITY): Payer: Self-pay | Admitting: Cardiac Rehabilitation

## 2015-09-20 ENCOUNTER — Ambulatory Visit (INDEPENDENT_AMBULATORY_CARE_PROVIDER_SITE_OTHER): Payer: Medicare PPO | Admitting: Interventional Cardiology

## 2015-09-20 VITALS — BP 100/68 | HR 69 | Ht 72.0 in | Wt 317.6 lb

## 2015-09-20 DIAGNOSIS — I5032 Chronic diastolic (congestive) heart failure: Secondary | ICD-10-CM

## 2015-09-20 DIAGNOSIS — Z720 Tobacco use: Secondary | ICD-10-CM

## 2015-09-20 DIAGNOSIS — E785 Hyperlipidemia, unspecified: Secondary | ICD-10-CM | POA: Diagnosis not present

## 2015-09-20 DIAGNOSIS — I2510 Atherosclerotic heart disease of native coronary artery without angina pectoris: Secondary | ICD-10-CM

## 2015-09-20 NOTE — Patient Instructions (Signed)
Medication Instructions:  Your physician recommends that you continue on your current medications as directed. Please refer to the Current Medication list given to you today.   Labwork: None ordered.  Testing/Procedures: None ordered  Follow-Up: Your physician wants you to follow-up in 6 months. You will receive a reminder letter in the mail two months in advance. If you don't receive a letter, please call our office to schedule the follow-up appointment.   Any Other Special Instructions Will Be Listed Below (If Applicable).     If you need a refill on your cardiac medications before your next appointment, please call your pharmacy.   

## 2015-09-20 NOTE — Progress Notes (Signed)
Cardiology Office Note   Date:  09/20/2015   ID:  James Torres, DOB January 19, 1966, MRN [id number]  PCP:   Melinda Crutch, MD  Cardiologist:  Sinclair Grooms, MD   Chief Complaint  Patient presents with  . Coronary Artery Disease      History of Present Illness: James Torres is a 49 y.o. male who presents for mid to distal LAD DES May 2016 during non-ST elevation MI, hyperlipidemia, chronic diastolic heart failure, hyperlipidemia, and metabolic syndrome.  The patient has difficulty with seizure disorder. Medication adjustments have been made at [facility]. Since medication adjustment of seizure disorder has improved. Double vision is improved.  With reference to coronary disease he has had no recurrence of chest discomfort. He has not needed to use nitroglycerin. He is in phase 2 cardiac rehabilitation and having no difficulty. His myocardial infarction occurred in May. He is compliant with dual antiplatelet therapy.   Past Medical History  Diagnosis Date  . Brain tumor ([place])   . Sleep apnea 2009    a. On CPAP.  [name] Anxiety   . Depression   . Kidney stone   . GERD (gastroesophageal reflux disease)   . Seizures ([place])     a. Has auras and metalic taste in mouth.  Last one 06/2014- last a few seconds, has one every couple weeks, Dr Mare Loan is neurologist and is aware.   . Migraines   . CAD (coronary artery disease)     a. 03/2015 NSTEMI: LM nl, LAD 50ost, 100p (2.75x38 Synergy DES), 75d, RI nl, LCX minor irregs, OM1 min irregs, RCA 50ost, EF 35-45%.  . Morbid obesity ([place])   . Hyperlipidemia   . Ischemic cardiomyopathy     a. 03/2015 EF 35-45% by LV gram @ time of NSTEMI;  b. 03/2015 Echo: EF 50-55%, sev HK to DK of dist an, apical, and infap walls.Gr 1 DD.  . Tobacco abuse   . Metabolic syndrome [id number]  . Pre-diabetes     Past Surgical History  Procedure Laterality Date  . Brain surgery  2004    Crainotomy for tumor  . Knee arthroscopy Right 2005  approx    cartliage  . Wisdom tooth extraction    . Ulnar nerve transposition Left 06/25/2014    Procedure: LEFT ULNAR NEUROPLASTY AT ELBOW;  Surgeon: Jolyn Nap, MD;  Location: [facility];  Service: Orthopedics;  Laterality: Left;  . Cardiac catheterization N/A 03/21/2015    Procedure: Left Heart Cath and Coronary Angiography;  Surgeon: Sherren Mocha, MD;  Location: [facility];  Service: Cardiovascular;  Laterality: N/A;     Current Outpatient Prescriptions  Medication Sig Dispense Refill  . ALPRAZolam (XANAX) 0.5 MG tablet Take 0.5 mg by mouth 2 (two) times daily as needed for anxiety. Only needs to take rarely    . aspirin EC 81 MG EC tablet Take 1 tablet (81 mg total) by mouth daily.    . carbamazepine (TEGRETOL XR) 400 MG 12 hr tablet Take two (2) tablets (800 mg total) by mouth every morning and take three (3) tablets (1,200 mg total) by mouth every evening.    . carvedilol (COREG) 3.125 MG tablet Take 1 tablet (3.125 mg total) by mouth 2 (two) times daily with a meal. 60 tablet 6  . citalopram (CELEXA) 40 MG tablet Take 40 mg by mouth every morning.    . clonazePAM (KLONOPIN) 0.5 MG tablet Take 0.5 mg by mouth 2 (two) times  daily.     . clopidogrel (PLAVIX) 75 MG tablet Take 1 tablet (75 mg total) by mouth daily with breakfast. 30 tablet 11  . famotidine (PEPCID) 20 MG tablet Take 1 tablet (20 mg total) by mouth every evening. 90 tablet 1  . lamoTRIgine (LAMICTAL) 100 MG tablet Take one (1) tablet (100 mg total) by mouth every morning and take two (2) tablets (200 mg total) by mouth every evening.    . levETIRAcetam (KEPPRA XR) 500 MG 24 hr tablet Take 1,000 mg by mouth every evening.  0  . lisinopril (PRINIVIL,ZESTRIL) 2.5 MG tablet Take 1 tablet (2.5 mg total) by mouth daily. 30 tablet 6  . nitroGLYCERIN (NITROSTAT) 0.4 MG SL tablet Place 0.4 mg under the tongue every 5 (five) minutes as needed for chest pain (x 3 pills daily).    [name] oxyCODONE-acetaminophen (ROXICET) 5-325 MG  per tablet Take 1-2 tablets by mouth every 4 (four) hours as needed for severe pain. 40 tablet 0  . pantoprazole (PROTONIX) 40 MG tablet Take 2 tablets (80 mg total) by mouth every morning. 60 tablet 6  . rosuvastatin (CRESTOR) 40 MG tablet Take 1 tablet (40 mg total) by mouth daily. 30 tablet 11   No current facility-administered medications for this visit.    Allergies:   Review of patient's allergies indicates no known allergies.    Social History:  The patient  reports that he has been smoking Cigarettes.  He has a 10 pack-year smoking history. He has never used smokeless tobacco. He reports that he does not drink alcohol or use illicit drugs.   Family History:  The patient's family history includes Healthy in his mother, sister, and sister; Heart disease in his father; Stroke in his father. There is no history of Heart attack.    ROS:  Please see the history of present illness.   Otherwise, review of systems are positive for double vision improving with medication adjustment, dizziness, and difficulty with recurring headaches..   All other systems are reviewed and negative.    PHYSICAL EXAM: VS:  BP 100/68 mmHg  Pulse 69  Ht 6' (1.829 m)  Wt 144.062 kg (317 lb 9.6 oz)  BMI 43.06 kg/m2  SpO2 96% , BMI Body mass index is 43.06 kg/(m^2). GEN: Well nourished, well developed, in no acute distressPeriod marked obesity but improving HEENT: normal Neck: no JVD, carotid bruits, or masses Cardiac: R RR.  There is no murmur, rub, or gallop. There is no edema. Respiratory:  clear to auscultation bilaterally, normal work of breathing. GI: soft, nontender, nondistended, + BS MS: no deformity or atrophy Skin: warm and dry, no rash Neuro:  Strength and sensation are intact Psych: euthymic mood, full affect   EKG:  EKG is not ordered today.    Recent Labs: 03/22/2015: B Natriuretic Peptide 89.0 03/29/2015: BUN 9; Creatinine, Ser 0.99; Hemoglobin 15.8; Platelets 209.0; Potassium 4.2;  Sodium 133* 09/13/2015: ALT 22    Lipid Panel    Component Value Date/Time   CHOL 154 09/13/2015 0906   TRIG 154* 09/13/2015 0906   HDL 37* 09/13/2015 0906   CHOLHDL 4.2 09/13/2015 0906   VLDL 31* 09/13/2015 0906   LDLCALC 86 09/13/2015 0906      Wt Readings from Last 3 Encounters:  09/20/15 144.062 kg (317 lb 9.6 oz)  09/13/15 144.607 kg (318 lb 12.8 oz)  06/16/15 152.1 kg (335 lb 5.1 oz)      Other studies Reviewed: Additional studies/ records that were reviewed today  include: Review of electronic medical record. The findings include none    ASSESSMENT AND PLAN:  1. Chronic diastolic heart failure (HCC) No evidence of volume overload  2. Hyperlipidemia Continue on anti-lipid therapy with atorvastatin  3. Coronary artery disease involving native coronary artery of native heart without angina pectoris Asymptomatic with reference to angina  4. Tobacco abuse Continued sation is encouraged    Current medicines are reviewed at length with the patient today.  The patient has the following concerns regarding medicines: They'll have to do with neurological medications. We did not further discuss that and he will speak with his neurologist at [facility].  The following changes/actions have been instituted:    Continue dual antiplatelet therapy  Continue phase II cardiac rehabilitation  Continue deficit weight loss  Labs/ tests ordered today include:  No orders of the defined types were placed in this encounter.     Disposition:   FU with HS in 6 month  Signed, Sinclair Grooms, MD  09/20/2015 4:26 PM    [address], [place], [place]  [address] Phone: [phone]; Fax: [phone]

## 2015-09-20 NOTE — Telephone Encounter (Signed)
Pc received from pt he is feeling better with recent medication adjustments from new neurologist at [facility].  Pt would like to continue his cardiac rehab program to finish his 18 week allowed.  Pt will plan to return to rehab next week.

## 2015-09-21 ENCOUNTER — Encounter (HOSPITAL_COMMUNITY): Payer: Medicare PPO

## 2015-09-23 ENCOUNTER — Encounter (HOSPITAL_COMMUNITY): Payer: Medicare PPO

## 2015-09-27 ENCOUNTER — Telehealth (HOSPITAL_COMMUNITY): Payer: Self-pay | Admitting: Cardiac Rehabilitation

## 2015-09-27 ENCOUNTER — Encounter (HOSPITAL_COMMUNITY): Payer: Self-pay | Admitting: Cardiac Rehabilitation

## 2015-09-27 NOTE — Telephone Encounter (Addendum)
Pt discharged from cardiac rehab program due to nonattendance. Pt absence due to migraine headache and complications from migraine medications.  Pt would like to return to cardiac rehab once migraine symptoms are stabilized.   Dr. Tamala Julian made aware.

## 2015-09-28 ENCOUNTER — Encounter (HOSPITAL_COMMUNITY): Payer: Self-pay | Admitting: Cardiac Rehabilitation

## 2015-10-12 ENCOUNTER — Other Ambulatory Visit: Payer: Self-pay | Admitting: *Deleted

## 2015-10-12 MED ORDER — CARVEDILOL 3.125 MG PO TABS: 3.1250 mg | ORAL_TABLET | Freq: Two times a day (BID) | ORAL | Status: DC

## 2015-10-12 MED ORDER — LISINOPRIL 2.5 MG PO TABS: 2.5000 mg | ORAL_TABLET | Freq: Every day | ORAL | Status: DC

## 2015-10-12 MED ORDER — PANTOPRAZOLE SODIUM 40 MG PO TBEC: 80.0000 mg | DELAYED_RELEASE_TABLET | ORAL | Status: DC

## 2015-11-29 ENCOUNTER — Other Ambulatory Visit: Payer: Self-pay | Admitting: Neurological Surgery

## 2015-11-29 DIAGNOSIS — D369 Benign neoplasm, unspecified site: Secondary | ICD-10-CM

## 2015-12-07 ENCOUNTER — Ambulatory Visit
Admission: RE | Admit: 2015-12-07 | Discharge: 2015-12-07 | Disposition: A | Discharge status: Home / Self Care | Payer: Medicare PPO | Source: Ambulatory Visit | Attending: Neurological Surgery | Admitting: Neurological Surgery

## 2015-12-07 DIAGNOSIS — D369 Benign neoplasm, unspecified site: Secondary | ICD-10-CM

## 2015-12-07 MED ORDER — GADOBENATE DIMEGLUMINE 529 MG/ML IV SOLN
20.0000 mL | Freq: Once | INTRAVENOUS | Status: AC | PRN
  Administered 2015-12-07: 20 mL via INTRAVENOUS

## 2016-01-26 ENCOUNTER — Other Ambulatory Visit: Payer: Self-pay | Admitting: *Deleted

## 2016-01-26 MED ORDER — FAMOTIDINE 20 MG PO TABS: 20.0000 mg | ORAL_TABLET | Freq: Every evening | ORAL | Status: DC

## 2016-03-05 ENCOUNTER — Other Ambulatory Visit: Payer: Self-pay | Admitting: *Deleted

## 2016-03-05 ENCOUNTER — Other Ambulatory Visit: Payer: Self-pay

## 2016-03-05 DIAGNOSIS — I2510 Atherosclerotic heart disease of native coronary artery without angina pectoris: Secondary | ICD-10-CM

## 2016-03-05 DIAGNOSIS — E785 Hyperlipidemia, unspecified: Secondary | ICD-10-CM

## 2016-03-05 MED ORDER — CLOPIDOGREL BISULFATE 75 MG PO TABS: 75.0000 mg | ORAL_TABLET | Freq: Every day | ORAL | Status: DC

## 2016-03-05 MED ORDER — ROSUVASTATIN CALCIUM 40 MG PO TABS: 40.0000 mg | ORAL_TABLET | Freq: Every day | ORAL | Status: DC

## 2016-03-22 ENCOUNTER — Other Ambulatory Visit: Payer: Medicare PPO

## 2016-03-23 ENCOUNTER — Ambulatory Visit: Payer: Medicare PPO | Admitting: Interventional Cardiology

## 2016-03-23 ENCOUNTER — Other Ambulatory Visit: Payer: Medicare PPO

## 2016-04-25 ENCOUNTER — Other Ambulatory Visit: Payer: Self-pay | Admitting: *Deleted

## 2016-04-25 MED ORDER — NITROGLYCERIN 0.4 MG SL SUBL: 0.4000 mg | SUBLINGUAL_TABLET | SUBLINGUAL | Status: DC | PRN

## 2016-05-04 ENCOUNTER — Other Ambulatory Visit: Payer: Self-pay | Admitting: Cardiology

## 2016-05-04 NOTE — Telephone Encounter (Signed)
Pt needs to schedule a follow up appointment for any future refills. [phone] 

## 2016-06-21 ENCOUNTER — Other Ambulatory Visit: Payer: Medicare PPO | Admitting: *Deleted

## 2016-06-21 DIAGNOSIS — E785 Hyperlipidemia, unspecified: Secondary | ICD-10-CM

## 2016-06-21 LAB — LIPID PANEL
CHOL/HDL RATIO: 3.2 ratio (ref <=5.0)
Cholesterol: 171 mg/dL (ref 125–200)
HDL: 54 mg/dL (ref >=40)
LDL CALC: 103 mg/dL (ref <130)
Triglycerides: 71 mg/dL (ref <150)
VLDL: 14 mg/dL (ref <30)

## 2016-06-21 LAB — ALT: ALT: 19 U/L (ref 9–46)

## 2016-06-22 ENCOUNTER — Ambulatory Visit (INDEPENDENT_AMBULATORY_CARE_PROVIDER_SITE_OTHER): Payer: Medicare PPO | Admitting: Interventional Cardiology

## 2016-06-22 ENCOUNTER — Encounter: Payer: Self-pay | Admitting: Interventional Cardiology

## 2016-06-22 VITALS — BP 100/66 | HR 74 | Ht 72.0 in | Wt 318.4 lb

## 2016-06-22 DIAGNOSIS — I2510 Atherosclerotic heart disease of native coronary artery without angina pectoris: Secondary | ICD-10-CM

## 2016-06-22 DIAGNOSIS — I255 Ischemic cardiomyopathy: Secondary | ICD-10-CM | POA: Diagnosis not present

## 2016-06-22 DIAGNOSIS — E785 Hyperlipidemia, unspecified: Secondary | ICD-10-CM

## 2016-06-22 DIAGNOSIS — I5032 Chronic diastolic (congestive) heart failure: Secondary | ICD-10-CM

## 2016-06-22 DIAGNOSIS — Z720 Tobacco use: Secondary | ICD-10-CM

## 2016-06-22 NOTE — Progress Notes (Signed)
Cardiology Office Note    Date:  06/22/2016   ID:  James Torres, DOB 04-Jul-1966, MRN [id number]  PCP:   Melinda Crutch, MD  Cardiologist: Sinclair Grooms, MD   Chief Complaint  Patient presents with  . Congestive Heart Failure  . Coronary Artery Disease    History of Present Illness:  James Torres is a 50 y.o. male follow-up of CAD, non-ST elevation myocardial infarction with transient reduction in LV function to 35-45% recovering to 50-55% after PCI and stenting of the circumflex with a 38 mm long Synergy DES May 2016, hyperlipidemia, obesity, sleep apnea, and anxiety disorder.  He feels well. He has not had angina. No medication side effects have been noted. No episodes of syncope. Denies orthopnea and PND.    Past Medical History:  Diagnosis Date  . Anxiety   . Brain tumor ([place])   . CAD (coronary artery disease)    a. 03/2015 NSTEMI: LM nl, LAD 50ost, 100p (2.75x38 Synergy DES), 75d, RI nl, LCX minor irregs, OM1 min irregs, RCA 50ost, EF 35-45%.  . Depression   . GERD (gastroesophageal reflux disease)   . Hyperlipidemia   . Ischemic cardiomyopathy    a. 03/2015 EF 35-45% by LV gram @ time of NSTEMI;  b. 03/2015 Echo: EF 50-55%, sev HK to DK of dist an, apical, and infap walls.Gr 1 DD.  [name] Kidney stone   . Metabolic syndrome [id number]  . Migraines   . Morbid obesity ([place])   . Pre-diabetes   . Seizures ([place])    a. Has auras and metalic taste in mouth.  Last one 06/2014- last a few seconds, has one every couple weeks, Dr Mare Loan is neurologist and is aware.   . Sleep apnea 2009   a. On CPAP.  [name] Tobacco abuse     Past Surgical History:  Procedure Laterality Date  . BRAIN SURGERY  2004   Crainotomy for tumor  . CARDIAC CATHETERIZATION N/A 03/21/2015   Procedure: Left Heart Cath and Coronary Angiography;  Surgeon: Sherren Mocha, MD;  Location: [facility];  Service: Cardiovascular;  Laterality: N/A;  . KNEE ARTHROSCOPY Right 2005 approx   cartliage  .  ULNAR NERVE TRANSPOSITION Left 06/25/2014   Procedure: LEFT ULNAR NEUROPLASTY AT ELBOW;  Surgeon: Jolyn Nap, MD;  Location: [facility];  Service: Orthopedics;  Laterality: Left;  . WISDOM TOOTH EXTRACTION      Current Medications: Outpatient Medications Prior to Visit  Medication Sig Dispense Refill  . ALPRAZolam (XANAX) 0.5 MG tablet Take 0.5 mg by mouth 2 (two) times daily as needed for anxiety. Only needs to take rarely    . aspirin EC 81 MG EC tablet Take 1 tablet (81 mg total) by mouth daily.    . carbamazepine (TEGRETOL XR) 400 MG 12 hr tablet Take two (2) tablets (800 mg total) by mouth every morning and take three (3) tablets (1,200 mg total) by mouth every evening.    . carvedilol (COREG) 3.125 MG tablet take 1 tablet by mouth twice a day WITH A MEAL 60 tablet 5  . citalopram (CELEXA) 40 MG tablet Take 40 mg by mouth every morning.    . clonazePAM (KLONOPIN) 0.5 MG tablet Take 0.5 mg by mouth 2 (two) times daily.     . clopidogrel (PLAVIX) 75 MG tablet Take 1 tablet (75 mg total) by mouth daily with breakfast. 30 tablet 3  . famotidine (PEPCID) 20 MG tablet Take 1 tablet (20  mg total) by mouth every evening. 90 tablet 2  . lamoTRIgine (LAMICTAL) 100 MG tablet Take one (1) tablet (100 mg total) by mouth every morning and take two (2) tablets (200 mg total) by mouth every evening.    . levETIRAcetam (KEPPRA XR) 500 MG 24 hr tablet Take 1,000 mg by mouth every evening.  0  . lisinopril (PRINIVIL,ZESTRIL) 2.5 MG tablet take 1 tablet by mouth once daily 30 tablet 5  . nitroGLYCERIN (NITROSTAT) 0.4 MG SL tablet Place 1 tablet (0.4 mg total) under the tongue every 5 (five) minutes as needed for chest pain (x 3 pills daily). 25 tablet 1  . oxyCODONE-acetaminophen (ROXICET) 5-325 MG per tablet Take 1-2 tablets by mouth every 4 (four) hours as needed for severe pain. 40 tablet 0  . pantoprazole (PROTONIX) 40 MG tablet take 2 tablets by mouth every morning 60 tablet 5  . rosuvastatin (CRESTOR)  40 MG tablet Take 1 tablet (40 mg total) by mouth daily. 30 tablet 5   No facility-administered medications prior to visit.      Allergies:   Review of patient's allergies indicates no known allergies.   Social History   Social History  . Marital status: Divorced    Spouse name: N/A  . Number of children: N/A  . Years of education: N/A   Social History Main Topics  . Smoking status: Current Every Day Smoker    Packs/day: 0.50    Years: 20.00    Types: Cigarettes  . Smokeless tobacco: Never Used  . Alcohol use No  . Drug use: No  . Sexual activity: Not Currently   Other Topics Concern  . None   Social History Narrative  . None     Family History:  The patient's family history includes Healthy in his mother, sister, and sister; Heart disease in his father; Stroke in his father.   ROS:   Please see the history of present illness.    Depression snoring. Recurring headaches/migraines. Constipation. Not yet willing to quit smoking. All other systems reviewed and are negative.   PHYSICAL EXAM:   VS:  BP 100/66   Pulse 74   Ht 6' (1.829 m)   Wt (!) 318 lb 6.4 oz (144.4 kg)   BMI 43.18 kg/m    GEN: Well nourished, well developed, in no acute distress . Morbidly obese HEENT: normal  Neck: no JVD, carotid bruits, or masses Cardiac: RRR; no murmurs, rubs, or gallops,no edema  Respiratory:  clear to auscultation bilaterally, normal work of breathing GI: soft, nontender, nondistended, + BS MS: no deformity or atrophy  Skin: warm and dry, no rash Neuro:  Alert and Oriented x 3, Strength and sensation are intact Psych: euthymic mood, full affect  Wt Readings from Last 3 Encounters:  06/22/16 (!) 318 lb 6.4 oz (144.4 kg)  09/20/15 (!) 317 lb 9.6 oz (144.1 kg)  09/13/15 (!) 318 lb 12.8 oz (144.6 kg)      Studies/Labs Reviewed:   EKG:  EKG  Was not performed  Recent Labs: 06/21/2016: ALT 19   Lipid Panel    Component Value Date/Time   CHOL 171 06/21/2016 1236    TRIG 71 06/21/2016 1236   HDL 54 06/21/2016 1236   CHOLHDL 3.2 06/21/2016 1236   VLDL 14 06/21/2016 1236   LDLCALC 103 06/21/2016 1236    Additional studies/ records that were reviewed today include:  Reviewed digital images and with such a long stent in the circumflex, I feel continued dual  antiplatelet therapy is necessary for some time longer.    ASSESSMENT:    1. Coronary artery disease involving native coronary artery of native heart without angina pectoris   2. Cardiomyopathy, ischemic   3. HLD (hyperlipidemia)   4. Chronic diastolic heart failure ([place])   5. Tobacco abuse      PLAN:  In order of problems listed above:  1. Stable without angina on dual antiplatelet therapy. Long stent in the circumflex date to longer antiplatelet therapy 2. No symptoms to suggest heart failure. LVEF improved from 40% to greater than 50% post PCI. 3. LDL was 100. Tighten diet. Continue rosuvastatin. Plan based diet 4. No evidence of volume overload. Low-salt diet.    Medication Adjustments/Labs and Tests Ordered: Current medicines are reviewed at length with the patient today.  Concerns regarding medicines are outlined above.  Medication changes, Labs and Tests ordered today are listed in the Patient Instructions below. There are no Patient Instructions on file for this visit.   Signed, Sinclair Grooms, MD  06/22/2016 9:12 AM    [facility] [place], [place], [place]  [address] Phone: [phone]; Fax: [phone]

## 2016-06-22 NOTE — Patient Instructions (Signed)
Medication Instructions:  Your physician recommends that you continue on your current medications as directed. Please refer to the Current Medication list given to you today.   Labwork: None ordered  Testing/Procedures: None ordered  Follow-Up: Your physician wants you to follow-up in: [place] will receive a reminder letter in the mail two months in advance. If you don't receive a letter, please call our office to schedule the follow-up appointment.   Any Other Special Instructions Will Be Listed Below (If Applicable).  Your physician discussed the importance of regular exercise and recommended that you start or continue a regular exercise program for good health.   High Cholesterol High cholesterol refers to having a high level of cholesterol in your blood. Cholesterol is a white, waxy, fat-like protein that your body needs in small amounts. Your liver makes all the cholesterol you need. Excess cholesterol comes from the food you eat. Cholesterol travels in your bloodstream through your blood vessels. If you have high cholesterol, deposits (plaque) may build up on the walls of your blood vessels. This makes the arteries narrower and stiffer. Plaque increases your risk of heart attack and stroke. Work with your health care provider to keep your cholesterol levels in a healthy range. RISK FACTORS Several things can make you more likely to have high cholesterol. These include:   Eating foods high in animal fat (saturated fat) or cholesterol.  Being overweight.  Not getting enough exercise.  Having a family history of high cholesterol. SIGNS AND SYMPTOMS High cholesterol does not cause symptoms. DIAGNOSIS  Your health care provider can do a blood test to check whether you have high cholesterol. If you are older than 20, your health care provider may check your cholesterol every 4-6 years. You may be checked more often if you already have high cholesterol or other  risk factors for heart disease. The blood test for cholesterol measures the following:  Bad cholesterol (LDL cholesterol). This is the type of cholesterol that causes heart disease. This number should be less than 100.  Good cholesterol (HDL cholesterol). This type helps protect against heart disease. A healthy level of HDL cholesterol is 60 or higher.  Total cholesterol. This is the combined number of LDL cholesterol and HDL cholesterol. A healthy number is less than 200. TREATMENT  High cholesterol can be treated with diet changes, lifestyle changes, and medicine.   Diet changes may include eating more whole grains, fruits, vegetables, nuts, and fish. You may also have to cut back on red meat and foods with a lot of added sugar.  Lifestyle changes may include getting at least 40 minutes of aerobic exercise three times a week. Aerobic exercises include walking, biking, and swimming. Aerobic exercise along with a healthy diet can help you maintain a healthy weight. Lifestyle changes may also include quitting smoking.  If diet and lifestyle changes are not enough to lower your cholesterol, your health care provider may prescribe a statin medicine. This medicine has been shown to lower cholesterol and also lower the risk of heart disease. HOME CARE INSTRUCTIONS  Only take over-the-counter or prescription medicines as directed by your health care provider.   Follow a healthy diet as directed by your health care provider. For instance:   Eat chicken (without skin), fish, veal, shellfish, ground [place] breast, and round or loin cuts of red meat.  Do not eat fried foods and fatty meats, such as hot dogs and salami.   Eat plenty of fruits,  such as apples.   Eat plenty of vegetables, such as broccoli, potatoes, and carrots.   Eat beans, peas, and lentils.   Eat grains, such as barley, rice, couscous, and bulgur wheat.   Eat pasta without cream sauces.   Use skim or nonfat milk and  low-fat or nonfat yogurt and cheeses. Do not eat or drink whole milk, cream, ice cream, egg yolks, and hard cheeses.   Do not eat stick margarine or tub margarines that contain trans fats (also called partially hydrogenated oils).   Do not eat cakes, cookies, crackers, or other baked goods that contain trans fats.   Do not eat saturated tropical oils, such as coconut and palm oil.   Exercise as directed by your health care provider. Increase your activity level with activities such as gardening or walking.   Keep all follow-up appointments.  SEEK MEDICAL CARE IF:  You are struggling to maintain a healthy diet or weight.  You need help starting an exercise program.  You need help to stop smoking. SEEK IMMEDIATE MEDICAL CARE IF:  You have chest pain.  You have trouble breathing.   This information is not intended to replace advice given to you by your health care provider. Make sure you discuss any questions you have with your health care provider.   Document Released: 10/22/2005 Document Revised: 11/12/2014 Document Reviewed: 08/14/2013 Elsevier Interactive Patient Education [employer].     If you need a refill on your cardiac medications before your next appointment, please call your pharmacy.

## 2016-07-06 ENCOUNTER — Other Ambulatory Visit: Payer: Self-pay | Admitting: Interventional Cardiology

## 2016-07-28 IMAGING — MR MR HEAD WO/W CM
12 of 13 series · 45 of 48 positions shown · IV contrast (multihance)
Comparison: 12/15/2014

CLINICAL DATA: History of epidermoid status post resection. No new
symptoms.

EXAM:
MRI HEAD WITHOUT AND WITH CONTRAST
TECHNIQUE: Multiplanar, multiecho pulse sequences of the brain and surrounding
structures were obtained without and with intravenous contrast.
CONTRAST:  20mL MULTIHANCE GADOBENATE DIMEGLUMINE 529 MG/ML IV SOLN

[Series 2: t1_se_sag · sagittal · 5.0mm · 0.45mm/px · 1 of 21 slices shown]
[im 1/21]
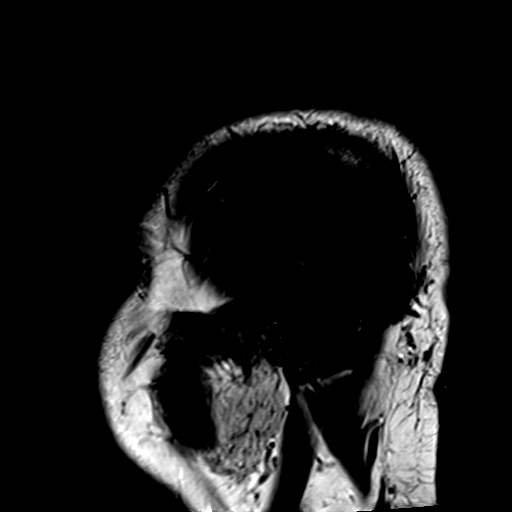

[Series 3: ep2d_diff_(id)_trace · axial · 3.0mm · 1.80mm/px · z∈[-39,+110]mm · 8 of 103 slices shown]
[im 1/103]
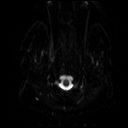
[im 15/103]
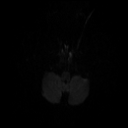
[im 30/103]
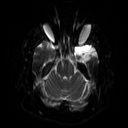
[im 44/103]
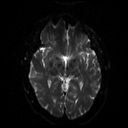
[im 59/103]
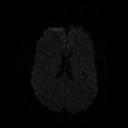
[im 73/103]
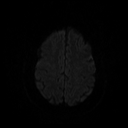
[im 88/103]
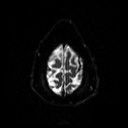
[im 103/103]
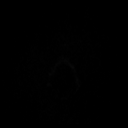

[Series 4: ep2d_diff_(id)_trace_adc · axial · 3.0mm · 1.80mm/px · z∈[-39,+110]mm · 4 of 52 slices shown]
[im 1/52]
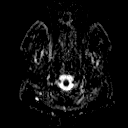
[im 18/52]
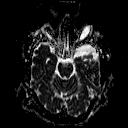
[im 35/52]
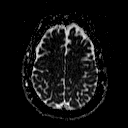
[im 52/52]
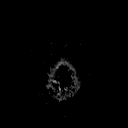

[Series 6: swi_images · axial · 2.0mm · 0.90mm/px · z∈[-34,+117]mm · 6 of 80 slices shown]
[im 1/80]
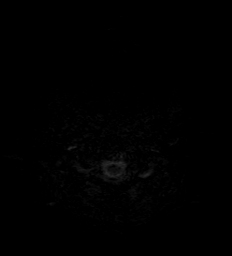
[im 16/80]
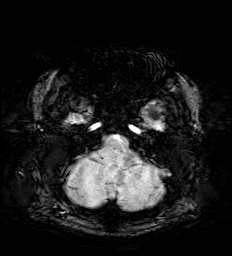
[im 32/80]
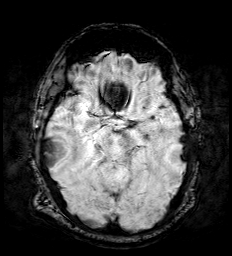
[im 48/80]
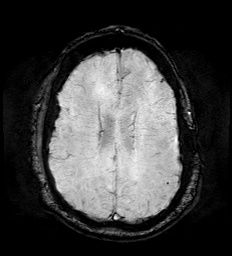
[im 64/80]
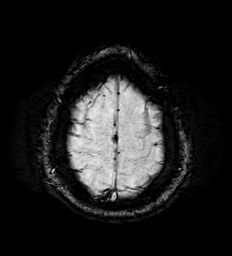
[im 80/80]
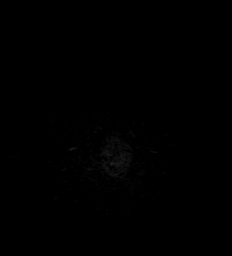

[Series 7: ep2d_diff_cor · coronal · 5.0mm · 1.77mm/px · 5 of 60 slices shown]
[im 1/60]
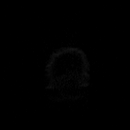
[im 15/60]
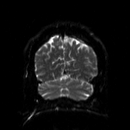
[im 30/60]
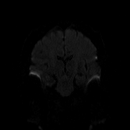
[im 45/60]
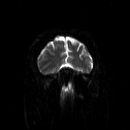
[im 60/60]
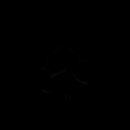

[Series 8: ep2d_diff_cor_adc · coronal · 5.0mm · 1.77mm/px · 2 of 30 slices shown]
[im 1/30]
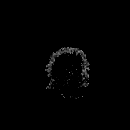
[im 30/30]
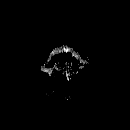

[Series 9: FLAIR · axial · 5.0mm · 0.45mm/px · z∈[-28,+111]mm · 2 of 24 slices shown]
[im 1/24]
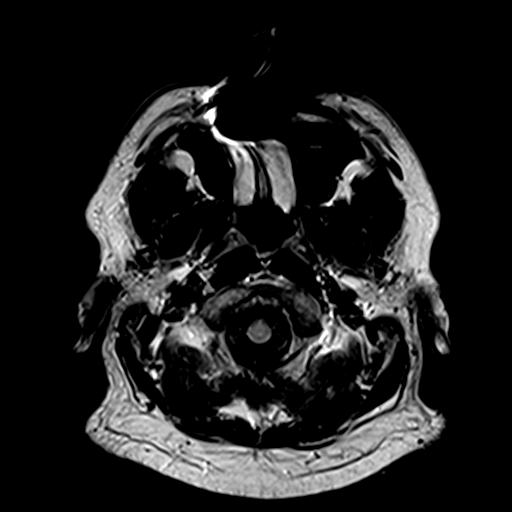
[im 24/24]
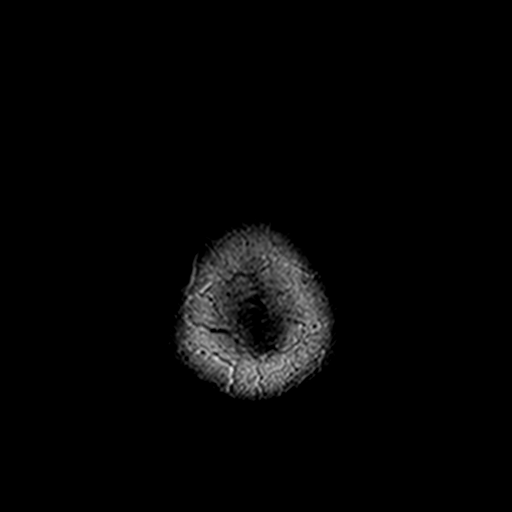

[Series 10: t2_tse_tra_512 · axial · 5.0mm · 0.60mm/px · z∈[-28,+111]mm · 2 of 24 slices shown]
[im 1/24]
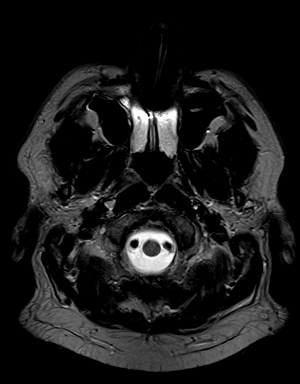
[im 24/24]
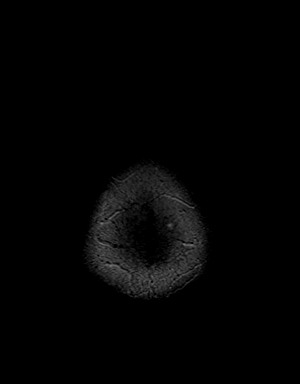

[Series 11: t1_mpr_tra · axial · 2.0mm · 0.45mm/px · z∈[-35,+118]mm · 6 of 80 slices shown]
[im 1/80]
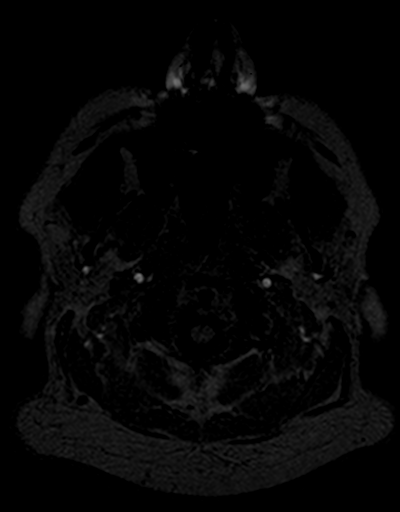
[im 16/80]
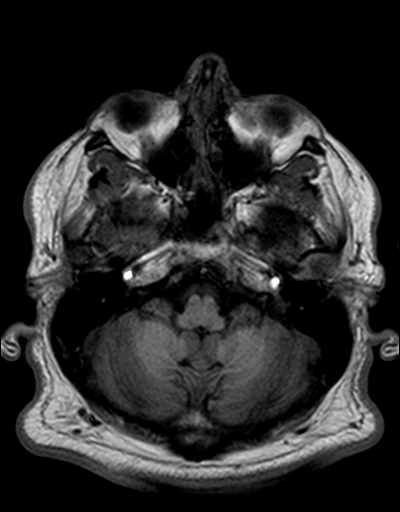
[im 32/80]
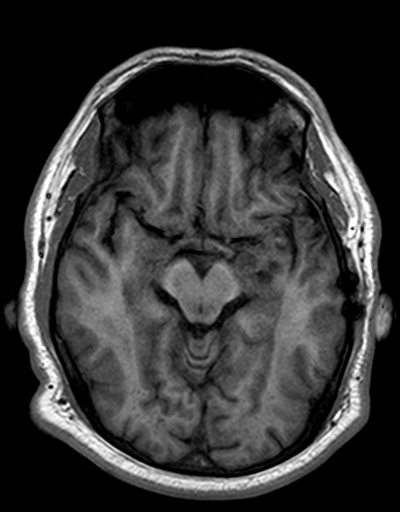
[im 48/80]
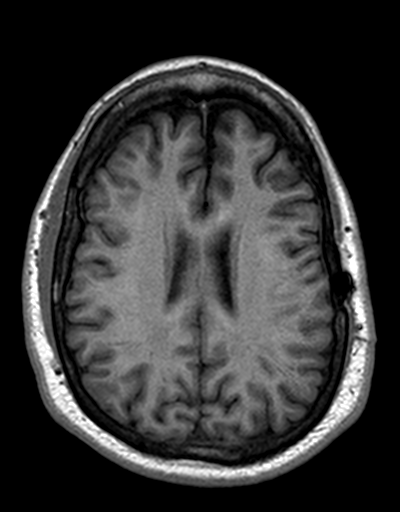
[im 64/80]
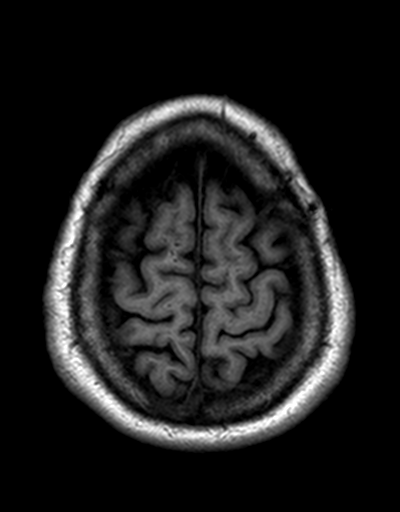
[im 80/80]
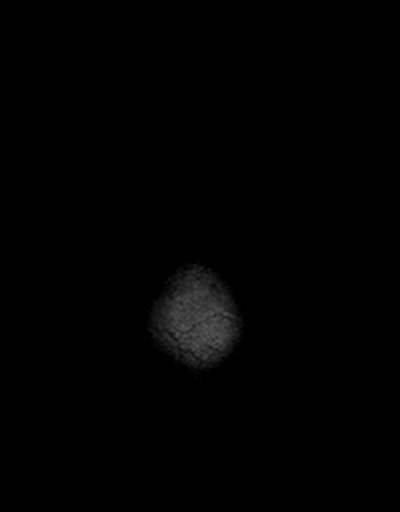

[Series 12: T2 · coronal · 5.0mm · 0.45mm/px · 2 of 28 slices shown]
[im 1/28]
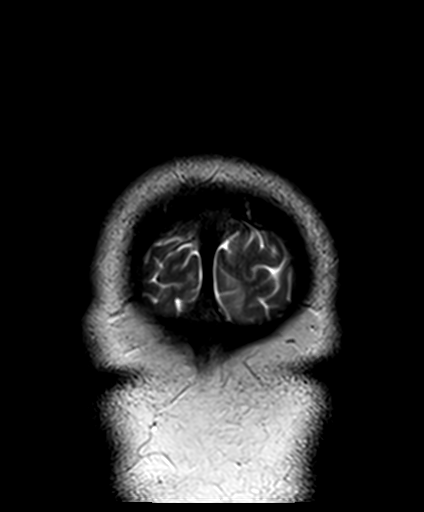
[im 28/28]
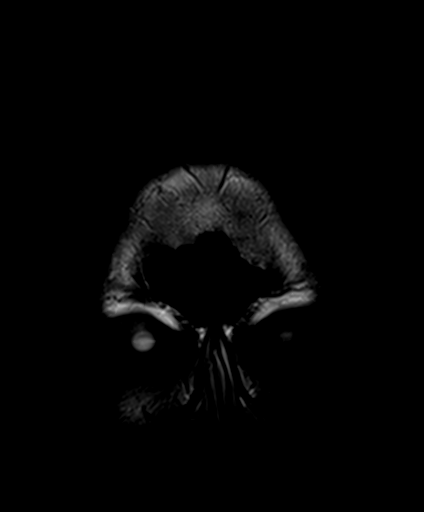

[Series 13: post cor · coronal · 5.0mm · 0.72mm/px · 2 of 28 slices shown]
[im 1/28]
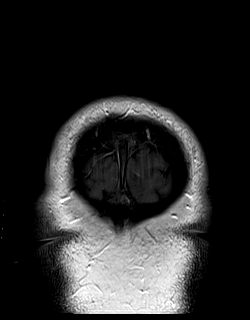
[im 28/28]
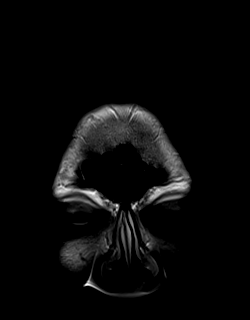

[Series 14: post t1_mpr_tra · axial · 2.0mm · 0.45mm/px · z∈[-35,+87]mm · 5 of 80 slices shown]
[im 1/80]
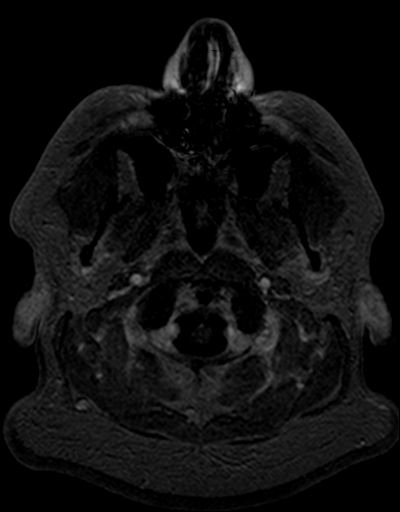
[im 16/80]
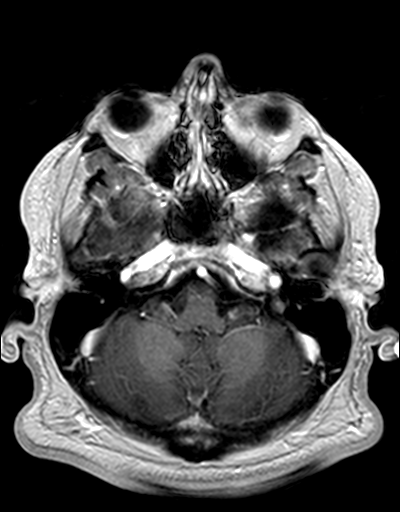
[im 32/80]
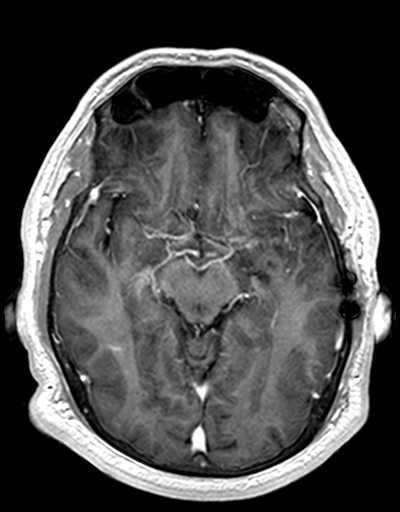
[im 48/80]
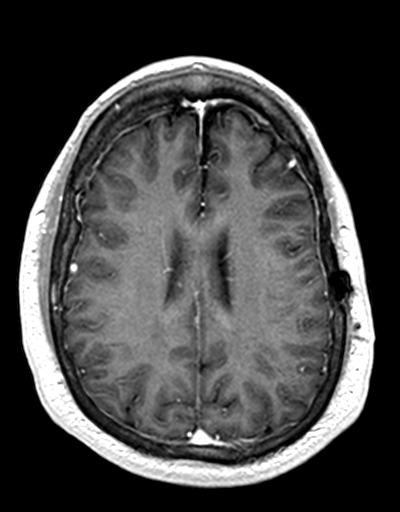
[im 64/80]
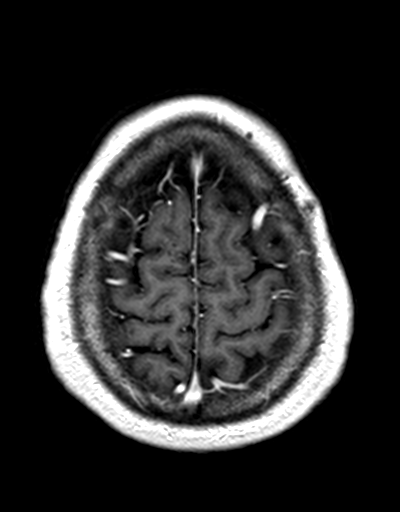

[45 of 48 positions shown; findings below may reference images not displayed]

FINDINGS: Sequelae of left pterional craniotomy and left temporal tumor
resection are again identified. Nodular foci of abnormal diffusion
weighted signal along the anterior margin of the left temporal lobe
have slightly enlarged, with mild increase of nodular and
heterogeneous FLAIR hyperintensity in this region (series 3, image
71; series 7, image 44; series 9, image 9). No masslike enhancement
is seen. Minimal curvilinear enhancement at the resection site is
likely postoperative. There is no significant mass effect.

There is no evidence of acute infarct or midline shift. Ventricles
are normal in size. A tiny developmental venous anomaly is noted in
the posterior right temporal lobe.

Orbits are unremarkable. Mild bilateral ethmoid air cell mucosal
thickening is noted. Mastoid air cells clear. Major intracranial
vascular flow voids are preserved.
IMPRESSION: Minimal increase in residual epidermoid in the left middle cranial
fossa region.

## 2016-09-02 ENCOUNTER — Other Ambulatory Visit: Payer: Self-pay | Admitting: Interventional Cardiology

## 2016-09-02 DIAGNOSIS — I2510 Atherosclerotic heart disease of native coronary artery without angina pectoris: Secondary | ICD-10-CM

## 2016-09-02 DIAGNOSIS — E785 Hyperlipidemia, unspecified: Secondary | ICD-10-CM

## 2016-10-01 ENCOUNTER — Other Ambulatory Visit: Payer: Self-pay | Admitting: *Deleted

## 2016-10-01 DIAGNOSIS — I2510 Atherosclerotic heart disease of native coronary artery without angina pectoris: Secondary | ICD-10-CM

## 2016-10-01 MED ORDER — ROSUVASTATIN CALCIUM 40 MG PO TABS: 40.0000 mg | ORAL_TABLET | Freq: Every day | ORAL | 3 refills | Status: DC

## 2016-10-17 ENCOUNTER — Other Ambulatory Visit: Payer: Self-pay | Admitting: Interventional Cardiology

## 2016-10-19 ENCOUNTER — Other Ambulatory Visit: Payer: Self-pay | Admitting: Interventional Cardiology

## 2017-04-16 ENCOUNTER — Other Ambulatory Visit: Payer: Self-pay | Admitting: Interventional Cardiology

## 2017-04-18 DIAGNOSIS — G43909 Migraine, unspecified, not intractable, without status migrainosus: Secondary | ICD-10-CM | POA: Insufficient documentation

## 2017-05-30 ENCOUNTER — Telehealth: Payer: Self-pay | Admitting: Interventional Cardiology

## 2017-05-30 DIAGNOSIS — E785 Hyperlipidemia, unspecified: Secondary | ICD-10-CM

## 2017-05-30 NOTE — Telephone Encounter (Signed)
Spoke with pt and made him aware ok to have fasting labs prior to appt with Dr. Tamala Julian.  Pt would like to come the Friday before his appt.  Advised this would be fine.  Scheduled labs for 07/19/17.

## 2017-05-30 NOTE — Telephone Encounter (Signed)
NEw Message  Pt would like to speak with RN to see if he will need to have blood work completed before [place] with Dr. Tamala Julian. Please call back to discuss

## 2017-06-10 ENCOUNTER — Other Ambulatory Visit: Payer: Self-pay | Admitting: Interventional Cardiology

## 2017-07-07 ENCOUNTER — Other Ambulatory Visit: Payer: Self-pay | Admitting: Interventional Cardiology

## 2017-07-09 ENCOUNTER — Other Ambulatory Visit: Payer: Self-pay | Admitting: Interventional Cardiology

## 2017-07-11 NOTE — Telephone Encounter (Signed)
Medication Detail    Disp Refills Start End   clopidogrel (PLAVIX) 75 MG tablet 30 tablet 0 07/09/2017    Sig: TAKE 1 TABLET BY MOUTH ONCE DAILY WITH BREAKFAST   Sent to pharmacy as: clopidogrel (PLAVIX) 75 MG tablet   E-Prescribing Status: Receipt confirmed by pharmacy (07/09/2017 4:55 PM EDT)   Pharmacy   [id number] - [place], [address] AT [address] [place]

## 2017-07-19 ENCOUNTER — Other Ambulatory Visit: Payer: Medicare PPO

## 2017-07-22 ENCOUNTER — Ambulatory Visit: Payer: Medicare PPO | Admitting: Interventional Cardiology

## 2017-07-23 ENCOUNTER — Ambulatory Visit: Payer: Medicare PPO | Admitting: Interventional Cardiology

## 2017-07-24 ENCOUNTER — Other Ambulatory Visit: Payer: Self-pay | Admitting: Interventional Cardiology

## 2017-08-02 ENCOUNTER — Other Ambulatory Visit: Payer: Self-pay | Admitting: Interventional Cardiology

## 2017-08-12 ENCOUNTER — Other Ambulatory Visit: Payer: Self-pay | Admitting: Interventional Cardiology

## 2017-09-02 ENCOUNTER — Other Ambulatory Visit: Payer: Medicare PPO

## 2017-09-02 DIAGNOSIS — E785 Hyperlipidemia, unspecified: Secondary | ICD-10-CM

## 2017-09-02 LAB — COMPREHENSIVE METABOLIC PANEL
ALBUMIN: 4.3 g/dL (ref 3.5–5.5)
ALK PHOS: 85 IU/L (ref 39–117)
ALT: 17 IU/L (ref 0–44)
AST: 21 IU/L (ref 0–40)
Albumin/Globulin Ratio: 2 (ref 1.2–2.2)
BILIRUBIN TOTAL: 0.4 mg/dL (ref 0.0–1.2)
BUN / CREAT RATIO: 12 (ref 9–20)
BUN: 12 mg/dL (ref 6–24)
CO2: 23 mmol/L (ref 20–29)
CREATININE: 0.98 mg/dL (ref 0.76–1.27)
Calcium: 9.2 mg/dL (ref 8.7–10.2)
Chloride: 103 mmol/L (ref 96–106)
GFR calc non Af Amer: 88 mL/min/{1.73_m2} (ref >59)
GFR, EST AFRICAN AMERICAN: 101 mL/min/{1.73_m2} (ref >59)
GLOBULIN, TOTAL: 2.2 g/dL (ref 1.5–4.5)
Glucose: 101 mg/dL — ABNORMAL HIGH (ref 65–99)
Potassium: 4.6 mmol/L (ref 3.5–5.2)
SODIUM: 142 mmol/L (ref 134–144)
TOTAL PROTEIN: 6.5 g/dL (ref 6.0–8.5)

## 2017-09-02 LAB — LIPID PANEL
CHOLESTEROL TOTAL: 129 mg/dL (ref 100–199)
Chol/HDL Ratio: 3.3 ratio (ref 0.0–5.0)
HDL: 39 mg/dL — AB (ref >39)
LDL Calculated: 66 mg/dL (ref 0–99)
Triglycerides: 122 mg/dL (ref 0–149)
VLDL Cholesterol Cal: 24 mg/dL (ref 5–40)

## 2017-09-02 NOTE — Progress Notes (Signed)
Cardiology Office Note    Date:  09/03/2017   ID:  James Torres, DOB 1966-06-01, MRN [id number]  PCP:  Lawerance Cruel, MD  Cardiologist: Sinclair Grooms, MD   Chief Complaint  Patient presents with  . Coronary Artery Disease    History of Present Illness:  James Torres is a 51 y.o. male follow-up of CAD, non-ST elevation myocardial infarction with transient reduction in LV function to 35-45% recovering to 50-55% after PCI and stenting of the circumflex with a 38 mm long Synergy DES May 2016, hyperlipidemia, obesity, sleep apnea, and anxiety disorder.  Doing well.  He denies chest pain.  He has not needed nitroglycerin.  He is relatively sedentary.  He has not been able to lose weight.  There is no lower extremity swelling.  He feels there are no active cardiac complaints.   Past Medical History:  Diagnosis Date  . Anxiety   . Brain tumor ([place])   . CAD (coronary artery disease)    a. 03/2015 NSTEMI: LM nl, LAD 50ost, 100p (2.75x38 Synergy DES), 75d, RI nl, LCX minor irregs, OM1 min irregs, RCA 50ost, EF 35-45%.  . Depression   . GERD (gastroesophageal reflux disease)   . Hyperlipidemia   . Ischemic cardiomyopathy    a. 03/2015 EF 35-45% by LV gram @ time of NSTEMI;  b. 03/2015 Echo: EF 50-55%, sev HK to DK of dist an, apical, and infap walls.Gr 1 DD.  [name] Kidney stone   . Metabolic syndrome 8/58/8502  . Migraines   . Morbid obesity ([place])   . Pre-diabetes   . Seizures ([place])    a. Has auras and metalic taste in mouth.  Last one 06/2014- last a few seconds, has one every couple weeks, Dr Mare Loan is neurologist and is aware.   . Sleep apnea 2009   a. On CPAP.  [name] Tobacco abuse     Past Surgical History:  Procedure Laterality Date  . BRAIN SURGERY  2004   Crainotomy for tumor  . CARDIAC CATHETERIZATION N/A 03/21/2015   Procedure: Left Heart Cath and Coronary Angiography;  Surgeon: Sherren Mocha, MD;  Location: [facility];  Service: Cardiovascular;   Laterality: N/A;  . KNEE ARTHROSCOPY Right 2005 approx   cartliage  . ULNAR NERVE TRANSPOSITION Left 06/25/2014   Procedure: LEFT ULNAR NEUROPLASTY AT ELBOW;  Surgeon: Jolyn Nap, MD;  Location: [facility];  Service: Orthopedics;  Laterality: Left;  . WISDOM TOOTH EXTRACTION      Current Medications: Outpatient Medications Prior to Visit  Medication Sig Dispense Refill  . ALPRAZolam (XANAX) 0.5 MG tablet Take 0.5 mg by mouth 2 (two) times daily as needed for anxiety. Only needs to take rarely    . aspirin EC 81 MG EC tablet Take 1 tablet (81 mg total) by mouth daily.    . carvedilol (COREG) 3.125 MG tablet TAKE 1 TABLET BY MOUTH TWICE A DAY WITH MEALS 180 tablet 0  . citalopram (CELEXA) 40 MG tablet Take 40 mg by mouth every morning.    . clonazePAM (KLONOPIN) 0.5 MG tablet Take 0.5 mg by mouth 2 (two) times daily.     . clopidogrel (PLAVIX) 75 MG tablet TAKE 1 TABLET BY MOUTH EVERY DAY WITH BREAKFAST 30 tablet 1  . famotidine (PEPCID) 20 MG tablet Take 1 tablet (20 mg total) by mouth every evening. Please keep upcoming appointment for further refills 90 tablet 0  . lamoTRIgine (LAMICTAL) 100 MG tablet Take  one (1) tablet (100 mg total) by mouth every morning and take two (2) tablets (200 mg total) by mouth every evening.    [name] lisinopril (PRINIVIL,ZESTRIL) 2.5 MG tablet TAKE 1 TABLET BY MOUTH ONCE DAILY 90 tablet 0  . nitroGLYCERIN (NITROSTAT) 0.4 MG SL tablet Place 1 tablet (0.4 mg total) under the tongue every 5 (five) minutes as needed for chest pain (x 3 pills daily). 25 tablet 1  . oxyCODONE-acetaminophen (ROXICET) 5-325 MG per tablet Take 1-2 tablets by mouth every 4 (four) hours as needed for severe pain. 40 tablet 0  . pantoprazole (PROTONIX) 40 MG tablet TAKE 2 TABLETS BY MOUTH EVERY MORNING 180 tablet 0  . rosuvastatin (CRESTOR) 40 MG tablet Take 1 tablet (40 mg total) by mouth daily. 90 tablet 3  . carbamazepine (TEGRETOL XR) 400 MG 12 hr tablet Take two (2) tablets (800 mg total)  by mouth every morning and take three (3) tablets (1,200 mg total) by mouth every evening.    . levETIRAcetam (KEPPRA XR) 500 MG 24 hr tablet Take 1,000 mg by mouth every evening.  0   No facility-administered medications prior to visit.      Allergies:   Patient has no known allergies.   Social History   Social History  . Marital status: Divorced    Spouse name: N/A  . Number of children: N/A  . Years of education: N/A   Social History Main Topics  . Smoking status: Current Every Day Smoker    Packs/day: 0.50    Years: 20.00    Types: Cigarettes  . Smokeless tobacco: Never Used  . Alcohol use No  . Drug use: No  . Sexual activity: Not Currently   Other Topics Concern  . None   Social History Narrative  . None     Family History:  The patient's family history includes Healthy in his mother, sister, and sister; Heart disease in his father; Stroke in his father.   ROS:   Please see the history of present illness.    Vision disturbance, occasional nausea and vomiting with migraines, headache,. All other systems reviewed and are negative.   PHYSICAL EXAM:   VS:  BP 106/62 (BP Location: Left Arm)   Pulse 77   Ht 6' (1.829 m)   Wt (!) 359 lb 9.6 oz (163.1 kg)   BMI 48.77 kg/m    GEN: Well nourished, well developed, in no acute distress .  Morbidly obese. HEENT: normal  Neck: no JVD, carotid bruits, or masses Cardiac: RRR; no murmurs, rubs, or gallops,no edema  Respiratory:  clear to auscultation bilaterally, normal work of breathing GI: soft, nontender, nondistended, + BS MS: no deformity or atrophy  Skin: warm and dry, no rash Neuro:  Alert and Oriented x 3, Strength and sensation are intact Psych: euthymic mood, full affect  Wt Readings from Last 3 Encounters:  09/03/17 (!) 359 lb 9.6 oz (163.1 kg)  06/22/16 (!) 318 lb 6.4 oz (144.4 kg)  09/20/15 (!) 317 lb 9.6 oz (144.1 kg)      Studies/Labs Reviewed:   EKG:  EKG sinus rhythm, old inferior infarct,  poor R wave progression and probable anterior infarct noted in leads V1 through V4.  When compared to prior tracings, inferior Q waves are new.  Recent Labs: 09/02/2017: ALT 17; BUN 12; Creatinine, Ser 0.98; Potassium 4.6; Sodium 142   Lipid Panel    Component Value Date/Time   CHOL 129 09/02/2017 1006   TRIG 122 09/02/2017  1006   HDL 39 (L) 09/02/2017 1006   CHOLHDL 3.3 09/02/2017 1006   CHOLHDL 3.2 06/21/2016 1236   VLDL 14 06/21/2016 1236   LDLCALC 66 09/02/2017 1006    Additional studies/ records that were reviewed today include:  Coronary angiography performed May 2016: Coronary Diagrams   Diagnostic Diagram       Post-Intervention Diagram           ASSESSMENT:    1. Coronary artery disease involving native coronary artery of native heart without angina pectoris   2. Chronic diastolic heart failure (HCC)   3. Other hyperlipidemia   4. Tobacco abuse   5. Cardiomyopathy, ischemic      PLAN:  In order of problems listed above:  1. Stable and now greater than 2 years post stenting.  Discontinue Plavix. 2. No evidence of volume overload. 3. Current LDL is 66, at target. 4. Denies continued tobacco use. 5. Function documented to have returned to normal by echo in May 2016.     Plan to discontinue Plavix.  Clinical follow-up in 1 year.  Medication Adjustments/Labs and Tests Ordered: Current medicines are reviewed at length with the patient today.  Concerns regarding medicines are outlined above.  Medication changes, Labs and Tests ordered today are listed in the Patient Instructions below. There are no Patient Instructions on file for this visit.   Signed, Sinclair Grooms, MD  09/03/2017 9:54 AM    [facility] [place], [place], [place]  [address] Phone: [phone]; Fax: [phone]

## 2017-09-03 ENCOUNTER — Encounter: Payer: Self-pay | Admitting: Interventional Cardiology

## 2017-09-03 ENCOUNTER — Ambulatory Visit (INDEPENDENT_AMBULATORY_CARE_PROVIDER_SITE_OTHER): Payer: Medicare PPO | Admitting: Interventional Cardiology

## 2017-09-03 VITALS — BP 106/62 | HR 77 | Ht 72.0 in | Wt 359.6 lb

## 2017-09-03 DIAGNOSIS — I255 Ischemic cardiomyopathy: Secondary | ICD-10-CM

## 2017-09-03 DIAGNOSIS — I5032 Chronic diastolic (congestive) heart failure: Secondary | ICD-10-CM | POA: Diagnosis not present

## 2017-09-03 DIAGNOSIS — I2510 Atherosclerotic heart disease of native coronary artery without angina pectoris: Secondary | ICD-10-CM | POA: Diagnosis not present

## 2017-09-03 DIAGNOSIS — E7849 Other hyperlipidemia: Secondary | ICD-10-CM

## 2017-09-03 DIAGNOSIS — Z720 Tobacco use: Secondary | ICD-10-CM

## 2017-09-03 NOTE — Patient Instructions (Signed)
Medication Instructions:  Your physician has recommended you make the following change in your medication:   STOP: Plavix 75 mg daily   Labwork: None ordered   Testing/Procedures: None ordered   Follow-Up: Your physician wants you to follow-up in: 1 year with Dr. Tamala Julian. You will receive a reminder letter in the mail two months in advance. If you don't receive a letter, please call our office to schedule the follow-up appointment.  Any Other Special Instructions Will Be Listed Below (If Applicable).     If you need a refill on your cardiac medications before your next appointment, please call your pharmacy.

## 2017-09-11 DIAGNOSIS — R7303 Prediabetes: Secondary | ICD-10-CM | POA: Diagnosis not present

## 2017-09-11 DIAGNOSIS — G40909 Epilepsy, unspecified, not intractable, without status epilepticus: Secondary | ICD-10-CM | POA: Diagnosis not present

## 2017-09-11 DIAGNOSIS — Z0000 Encounter for general adult medical examination without abnormal findings: Secondary | ICD-10-CM | POA: Diagnosis not present

## 2017-09-11 DIAGNOSIS — G43909 Migraine, unspecified, not intractable, without status migrainosus: Secondary | ICD-10-CM | POA: Diagnosis not present

## 2017-09-11 DIAGNOSIS — Z1389 Encounter for screening for other disorder: Secondary | ICD-10-CM | POA: Diagnosis not present

## 2017-09-11 DIAGNOSIS — Z79899 Other long term (current) drug therapy: Secondary | ICD-10-CM | POA: Diagnosis not present

## 2017-09-11 DIAGNOSIS — Z23 Encounter for immunization: Secondary | ICD-10-CM | POA: Diagnosis not present

## 2017-09-25 DIAGNOSIS — G5601 Carpal tunnel syndrome, right upper limb: Secondary | ICD-10-CM | POA: Diagnosis not present

## 2017-09-30 DIAGNOSIS — G5601 Carpal tunnel syndrome, right upper limb: Secondary | ICD-10-CM | POA: Diagnosis not present

## 2017-10-09 ENCOUNTER — Other Ambulatory Visit: Payer: Self-pay | Admitting: Interventional Cardiology

## 2017-10-09 DIAGNOSIS — I2510 Atherosclerotic heart disease of native coronary artery without angina pectoris: Secondary | ICD-10-CM

## 2017-10-10 DIAGNOSIS — G4733 Obstructive sleep apnea (adult) (pediatric): Secondary | ICD-10-CM | POA: Diagnosis not present

## 2017-10-11 DIAGNOSIS — G5601 Carpal tunnel syndrome, right upper limb: Secondary | ICD-10-CM | POA: Diagnosis not present

## 2017-10-24 ENCOUNTER — Other Ambulatory Visit: Payer: Self-pay | Admitting: Interventional Cardiology

## 2017-10-28 DIAGNOSIS — R509 Fever, unspecified: Secondary | ICD-10-CM | POA: Diagnosis not present

## 2017-10-28 DIAGNOSIS — R05 Cough: Secondary | ICD-10-CM | POA: Diagnosis not present

## 2017-11-04 DIAGNOSIS — G4489 Other headache syndrome: Secondary | ICD-10-CM | POA: Diagnosis not present

## 2017-11-04 DIAGNOSIS — R569 Unspecified convulsions: Secondary | ICD-10-CM | POA: Diagnosis not present

## 2017-11-04 DIAGNOSIS — F419 Anxiety disorder, unspecified: Secondary | ICD-10-CM | POA: Diagnosis not present

## 2017-11-04 DIAGNOSIS — G44201 Tension-type headache, unspecified, intractable: Secondary | ICD-10-CM | POA: Diagnosis not present

## 2017-11-04 DIAGNOSIS — Z87891 Personal history of nicotine dependence: Secondary | ICD-10-CM | POA: Diagnosis not present

## 2017-11-04 DIAGNOSIS — Z79899 Other long term (current) drug therapy: Secondary | ICD-10-CM | POA: Diagnosis not present

## 2017-11-12 ENCOUNTER — Other Ambulatory Visit: Payer: Self-pay | Admitting: Interventional Cardiology

## 2017-12-25 DIAGNOSIS — F419 Anxiety disorder, unspecified: Secondary | ICD-10-CM | POA: Diagnosis not present

## 2017-12-25 DIAGNOSIS — G43909 Migraine, unspecified, not intractable, without status migrainosus: Secondary | ICD-10-CM | POA: Diagnosis not present

## 2018-02-07 DIAGNOSIS — G4733 Obstructive sleep apnea (adult) (pediatric): Secondary | ICD-10-CM | POA: Diagnosis not present

## 2018-02-17 DIAGNOSIS — G5601 Carpal tunnel syndrome, right upper limb: Secondary | ICD-10-CM | POA: Diagnosis not present

## 2018-03-12 DIAGNOSIS — F419 Anxiety disorder, unspecified: Secondary | ICD-10-CM | POA: Diagnosis not present

## 2018-03-12 DIAGNOSIS — G43909 Migraine, unspecified, not intractable, without status migrainosus: Secondary | ICD-10-CM | POA: Diagnosis not present

## 2018-03-12 DIAGNOSIS — Z79899 Other long term (current) drug therapy: Secondary | ICD-10-CM | POA: Diagnosis not present

## 2018-04-08 DIAGNOSIS — Z79899 Other long term (current) drug therapy: Secondary | ICD-10-CM | POA: Diagnosis not present

## 2018-04-08 DIAGNOSIS — Z9989 Dependence on other enabling machines and devices: Secondary | ICD-10-CM | POA: Diagnosis not present

## 2018-04-08 DIAGNOSIS — G40109 Localization-related (focal) (partial) symptomatic epilepsy and epileptic syndromes with simple partial seizures, not intractable, without status epilepticus: Secondary | ICD-10-CM | POA: Diagnosis not present

## 2018-04-08 DIAGNOSIS — G40019 Localization-related (focal) (partial) idiopathic epilepsy and epileptic syndromes with seizures of localized onset, intractable, without status epilepticus: Secondary | ICD-10-CM | POA: Diagnosis not present

## 2018-04-08 DIAGNOSIS — G4700 Insomnia, unspecified: Secondary | ICD-10-CM | POA: Diagnosis not present

## 2018-04-08 DIAGNOSIS — G4730 Sleep apnea, unspecified: Secondary | ICD-10-CM | POA: Diagnosis not present

## 2018-04-08 DIAGNOSIS — R51 Headache: Secondary | ICD-10-CM | POA: Diagnosis not present

## 2018-04-08 DIAGNOSIS — F17210 Nicotine dependence, cigarettes, uncomplicated: Secondary | ICD-10-CM | POA: Diagnosis not present

## 2018-05-27 DIAGNOSIS — G4733 Obstructive sleep apnea (adult) (pediatric): Secondary | ICD-10-CM | POA: Diagnosis not present

## 2018-06-12 DIAGNOSIS — I252 Old myocardial infarction: Secondary | ICD-10-CM | POA: Diagnosis not present

## 2018-06-12 DIAGNOSIS — Z86011 Personal history of benign neoplasm of the brain: Secondary | ICD-10-CM | POA: Diagnosis not present

## 2018-06-12 DIAGNOSIS — F17210 Nicotine dependence, cigarettes, uncomplicated: Secondary | ICD-10-CM | POA: Diagnosis not present

## 2018-06-12 DIAGNOSIS — Z9889 Other specified postprocedural states: Secondary | ICD-10-CM | POA: Diagnosis not present

## 2018-06-12 DIAGNOSIS — Z6841 Body Mass Index (BMI) 40.0 and over, adult: Secondary | ICD-10-CM | POA: Diagnosis not present

## 2018-06-12 DIAGNOSIS — R569 Unspecified convulsions: Secondary | ICD-10-CM | POA: Diagnosis not present

## 2018-06-12 DIAGNOSIS — F329 Major depressive disorder, single episode, unspecified: Secondary | ICD-10-CM | POA: Diagnosis not present

## 2018-06-12 DIAGNOSIS — G4733 Obstructive sleep apnea (adult) (pediatric): Secondary | ICD-10-CM | POA: Diagnosis not present

## 2018-06-12 DIAGNOSIS — R51 Headache: Secondary | ICD-10-CM | POA: Diagnosis not present

## 2018-06-12 DIAGNOSIS — Z820 Family history of epilepsy and other diseases of the nervous system: Secondary | ICD-10-CM | POA: Diagnosis not present

## 2018-06-21 DIAGNOSIS — D332 Benign neoplasm of brain, unspecified: Secondary | ICD-10-CM | POA: Diagnosis not present

## 2018-06-21 DIAGNOSIS — G4483 Primary cough headache: Secondary | ICD-10-CM | POA: Diagnosis not present

## 2018-06-21 DIAGNOSIS — R51 Headache: Secondary | ICD-10-CM | POA: Diagnosis not present

## 2018-06-21 DIAGNOSIS — C4442 Squamous cell carcinoma of skin of scalp and neck: Secondary | ICD-10-CM | POA: Diagnosis not present

## 2018-06-30 DIAGNOSIS — T8189XS Other complications of procedures, not elsewhere classified, sequela: Secondary | ICD-10-CM | POA: Diagnosis not present

## 2018-06-30 DIAGNOSIS — I6522 Occlusion and stenosis of left carotid artery: Secondary | ICD-10-CM | POA: Diagnosis not present

## 2018-06-30 DIAGNOSIS — R51 Headache: Secondary | ICD-10-CM | POA: Diagnosis not present

## 2018-07-14 ENCOUNTER — Other Ambulatory Visit: Payer: Self-pay | Admitting: Interventional Cardiology

## 2018-07-14 DIAGNOSIS — I2510 Atherosclerotic heart disease of native coronary artery without angina pectoris: Secondary | ICD-10-CM

## 2018-07-15 DIAGNOSIS — M5481 Occipital neuralgia: Secondary | ICD-10-CM | POA: Diagnosis not present

## 2018-07-25 DIAGNOSIS — G40802 Other epilepsy, not intractable, without status epilepticus: Secondary | ICD-10-CM | POA: Diagnosis not present

## 2018-07-25 DIAGNOSIS — G930 Cerebral cysts: Secondary | ICD-10-CM | POA: Diagnosis not present

## 2018-07-25 DIAGNOSIS — G43909 Migraine, unspecified, not intractable, without status migrainosus: Secondary | ICD-10-CM | POA: Diagnosis not present

## 2018-07-30 DIAGNOSIS — I252 Old myocardial infarction: Secondary | ICD-10-CM | POA: Diagnosis not present

## 2018-07-30 DIAGNOSIS — G4733 Obstructive sleep apnea (adult) (pediatric): Secondary | ICD-10-CM | POA: Diagnosis not present

## 2018-07-30 DIAGNOSIS — Z9989 Dependence on other enabling machines and devices: Secondary | ICD-10-CM | POA: Diagnosis not present

## 2018-07-30 DIAGNOSIS — H538 Other visual disturbances: Secondary | ICD-10-CM | POA: Diagnosis not present

## 2018-07-30 DIAGNOSIS — R110 Nausea: Secondary | ICD-10-CM | POA: Diagnosis not present

## 2018-07-30 DIAGNOSIS — Z7982 Long term (current) use of aspirin: Secondary | ICD-10-CM | POA: Diagnosis not present

## 2018-07-30 DIAGNOSIS — R51 Headache: Secondary | ICD-10-CM | POA: Diagnosis not present

## 2018-07-30 DIAGNOSIS — F17210 Nicotine dependence, cigarettes, uncomplicated: Secondary | ICD-10-CM | POA: Diagnosis not present

## 2018-07-30 DIAGNOSIS — Z8669 Personal history of other diseases of the nervous system and sense organs: Secondary | ICD-10-CM | POA: Diagnosis not present

## 2018-07-30 DIAGNOSIS — F329 Major depressive disorder, single episode, unspecified: Secondary | ICD-10-CM | POA: Diagnosis not present

## 2018-07-30 DIAGNOSIS — Z79899 Other long term (current) drug therapy: Secondary | ICD-10-CM | POA: Diagnosis not present

## 2018-07-30 DIAGNOSIS — Z6841 Body Mass Index (BMI) 40.0 and over, adult: Secondary | ICD-10-CM | POA: Diagnosis not present

## 2018-08-01 ENCOUNTER — Encounter: Payer: Self-pay | Admitting: *Deleted

## 2018-08-01 ENCOUNTER — Other Ambulatory Visit: Payer: Self-pay | Admitting: Interventional Cardiology

## 2018-08-03 ENCOUNTER — Other Ambulatory Visit: Payer: Self-pay | Admitting: Interventional Cardiology

## 2018-08-22 DIAGNOSIS — G930 Cerebral cysts: Secondary | ICD-10-CM | POA: Diagnosis not present

## 2018-08-29 DIAGNOSIS — R569 Unspecified convulsions: Secondary | ICD-10-CM | POA: Diagnosis not present

## 2018-08-29 DIAGNOSIS — G43909 Migraine, unspecified, not intractable, without status migrainosus: Secondary | ICD-10-CM | POA: Diagnosis not present

## 2018-08-29 DIAGNOSIS — G930 Cerebral cysts: Secondary | ICD-10-CM | POA: Diagnosis not present

## 2018-09-03 ENCOUNTER — Other Ambulatory Visit: Payer: Self-pay | Admitting: Interventional Cardiology

## 2018-09-11 ENCOUNTER — Other Ambulatory Visit: Payer: Self-pay | Admitting: Interventional Cardiology

## 2018-09-19 DIAGNOSIS — G4733 Obstructive sleep apnea (adult) (pediatric): Secondary | ICD-10-CM | POA: Diagnosis not present

## 2018-09-24 ENCOUNTER — Other Ambulatory Visit: Payer: Self-pay | Admitting: Interventional Cardiology

## 2018-09-28 ENCOUNTER — Other Ambulatory Visit: Payer: Self-pay | Admitting: Interventional Cardiology

## 2018-09-28 DIAGNOSIS — I2510 Atherosclerotic heart disease of native coronary artery without angina pectoris: Secondary | ICD-10-CM

## 2018-09-29 DIAGNOSIS — I491 Atrial premature depolarization: Secondary | ICD-10-CM | POA: Diagnosis not present

## 2018-09-29 DIAGNOSIS — I493 Ventricular premature depolarization: Secondary | ICD-10-CM | POA: Diagnosis not present

## 2018-09-29 DIAGNOSIS — G4733 Obstructive sleep apnea (adult) (pediatric): Secondary | ICD-10-CM | POA: Diagnosis not present

## 2018-10-01 ENCOUNTER — Other Ambulatory Visit: Payer: Self-pay | Admitting: Interventional Cardiology

## 2018-10-06 DIAGNOSIS — Z0000 Encounter for general adult medical examination without abnormal findings: Secondary | ICD-10-CM | POA: Diagnosis not present

## 2018-10-06 DIAGNOSIS — Z13220 Encounter for screening for lipoid disorders: Secondary | ICD-10-CM | POA: Diagnosis not present

## 2018-10-06 DIAGNOSIS — E559 Vitamin D deficiency, unspecified: Secondary | ICD-10-CM | POA: Diagnosis not present

## 2018-10-08 DIAGNOSIS — G43909 Migraine, unspecified, not intractable, without status migrainosus: Secondary | ICD-10-CM | POA: Diagnosis not present

## 2018-10-08 DIAGNOSIS — E559 Vitamin D deficiency, unspecified: Secondary | ICD-10-CM | POA: Diagnosis not present

## 2018-10-08 DIAGNOSIS — F419 Anxiety disorder, unspecified: Secondary | ICD-10-CM | POA: Diagnosis not present

## 2018-10-08 DIAGNOSIS — F324 Major depressive disorder, single episode, in partial remission: Secondary | ICD-10-CM | POA: Diagnosis not present

## 2018-10-08 DIAGNOSIS — Z23 Encounter for immunization: Secondary | ICD-10-CM | POA: Diagnosis not present

## 2018-10-08 DIAGNOSIS — Z0000 Encounter for general adult medical examination without abnormal findings: Secondary | ICD-10-CM | POA: Diagnosis not present

## 2018-10-08 DIAGNOSIS — R7303 Prediabetes: Secondary | ICD-10-CM | POA: Diagnosis not present

## 2018-10-19 ENCOUNTER — Other Ambulatory Visit: Payer: Self-pay | Admitting: Interventional Cardiology

## 2018-10-20 DIAGNOSIS — G930 Cerebral cysts: Secondary | ICD-10-CM | POA: Diagnosis not present

## 2018-10-21 DIAGNOSIS — Z510 Encounter for antineoplastic radiation therapy: Secondary | ICD-10-CM | POA: Diagnosis not present

## 2018-10-21 DIAGNOSIS — L720 Epidermal cyst: Secondary | ICD-10-CM | POA: Diagnosis not present

## 2018-10-21 DIAGNOSIS — G930 Cerebral cysts: Secondary | ICD-10-CM | POA: Diagnosis not present

## 2018-11-12 DIAGNOSIS — G930 Cerebral cysts: Secondary | ICD-10-CM | POA: Diagnosis not present

## 2018-11-12 DIAGNOSIS — G43109 Migraine with aura, not intractable, without status migrainosus: Secondary | ICD-10-CM | POA: Diagnosis not present

## 2018-11-12 DIAGNOSIS — G43709 Chronic migraine without aura, not intractable, without status migrainosus: Secondary | ICD-10-CM | POA: Diagnosis not present

## 2018-11-17 ENCOUNTER — Other Ambulatory Visit: Payer: Self-pay | Admitting: Interventional Cardiology

## 2018-11-21 ENCOUNTER — Ambulatory Visit: Payer: Medicare PPO | Admitting: Interventional Cardiology

## 2018-11-27 ENCOUNTER — Other Ambulatory Visit: Payer: Self-pay | Admitting: Interventional Cardiology

## 2018-12-01 DIAGNOSIS — G4733 Obstructive sleep apnea (adult) (pediatric): Secondary | ICD-10-CM | POA: Diagnosis not present

## 2018-12-10 ENCOUNTER — Ambulatory Visit: Payer: Medicare PPO | Admitting: Nurse Practitioner

## 2018-12-10 ENCOUNTER — Encounter: Payer: Self-pay | Admitting: Nurse Practitioner

## 2018-12-10 ENCOUNTER — Other Ambulatory Visit: Payer: Self-pay | Admitting: Interventional Cardiology

## 2018-12-10 VITALS — BP 88/62 | HR 74 | Ht 72.0 in | Wt 381.1 lb

## 2018-12-10 DIAGNOSIS — I5032 Chronic diastolic (congestive) heart failure: Secondary | ICD-10-CM | POA: Diagnosis not present

## 2018-12-10 DIAGNOSIS — I2510 Atherosclerotic heart disease of native coronary artery without angina pectoris: Secondary | ICD-10-CM | POA: Diagnosis not present

## 2018-12-10 MED ORDER — NITROGLYCERIN 0.4 MG SL SUBL: 0.4000 mg | SUBLINGUAL_TABLET | SUBLINGUAL | 6 refills | Status: DC | PRN

## 2018-12-10 MED ORDER — CARVEDILOL 3.125 MG PO TABS: 3.1250 mg | ORAL_TABLET | Freq: Two times a day (BID) | ORAL | 3 refills | Status: DC

## 2018-12-10 MED ORDER — LISINOPRIL 2.5 MG PO TABS: 2.5000 mg | ORAL_TABLET | Freq: Every day | ORAL | 3 refills | Status: DC

## 2018-12-10 NOTE — Patient Instructions (Addendum)
We will be checking the following labs today - NONE   Medication Instructions:    Continue with your current medicines.   I sent in your refills today   If you need a refill on your cardiac medications before your next appointment, please call your pharmacy.     Testing/Procedures To Be Arranged:  N/A  Follow-Up:   See [place] in about 6 months    At [facility], you and your health needs are our priority.  As part of our continuing mission to provide you with exceptional heart care, we have created designated Provider Care Teams.  These Care Teams include your primary Cardiologist (physician) and Advanced Practice Providers (APPs -  Physician Assistants and Nurse Practitioners) who all work together to provide you with the care you need, when you need it.  Special Instructions:  . None  Call the [place] office at [phone] if you have any questions, problems or concerns.

## 2018-12-10 NOTE — Progress Notes (Signed)
CARDIOLOGY OFFICE NOTE  Date:  12/10/2018    James Torres Date of Birth: Jan 04, 1966 Medical Record #[id number]  PCP:  Lawerance Cruel, MD  Cardiologist:  Tamala Julian  Chief Complaint  Patient presents with  . Coronary Artery Disease    1 year check - seen for Dr. Tamala Julian    History of Present Illness: James Torres is a 53 y.o. male who presents today for a follow up visit. Seen for Dr. Tamala Julian.   He has a history of CAD with prior NSTEMI with transient reduction in LV function to 35-45% recovering to 50-55% after PCI and stenting of the circumflex with a 38 mm long Synergy DES in May 2016. His other issues include prior tobacco abuse, HLD, obesity, OSA and anxiety. He has a history of a benign brain tumor/epidermoid cyst - has had gamma knife 10/2018- has chronic headaches, seizure disorder - now followed at [facility].   He was last seen here in October of 2018 - was felt to be doing well - not able to lose weight. Plavix was stopped. Not smoking.   Comes in today. Here alone. He has had gamma knife for his epidermoid cyst - he has seizures and chronic migraines. He was found to have carotid disease noted with the work up - moderate in nature.  He does not answer when asked about driving. His neurology care is all thru [facility] now. No chest pain. He is very inactive. Can't do really anything without a migraine. Weight has escalated dramatically. He is back smoking but trying to taper down. Needs medicines refilled. BP little low but he is totally asymptomatic. His labs were checked in December by his PCP - these are reviewed by the [facility].   Past Medical History:  Diagnosis Date  . Anxiety   . Brain tumor ([place])   . CAD (coronary artery disease)    a. 03/2015 NSTEMI: LM nl, LAD 50ost, 100p (2.75x38 Synergy DES), 75d, RI nl, LCX minor irregs, OM1 min irregs, RCA 50ost, EF 35-45%.  . Depression   . GERD (gastroesophageal reflux disease)   . Hyperlipidemia   . Ischemic cardiomyopathy    a.  03/2015 EF 35-45% by LV gram @ time of NSTEMI;  b. 03/2015 Echo: EF 50-55%, sev HK to DK of dist an, apical, and infap walls.Gr 1 DD.  [name] Kidney stone   . Metabolic syndrome 2/87/6811  . Migraines   . Morbid obesity ([place])   . Pre-diabetes   . Seizures ([place])    a. Has auras and metalic taste in mouth.  Last one 06/2014- last a few seconds, has one every couple weeks, Dr Mare Loan is neurologist and is aware.   . Sleep apnea 2009   a. On CPAP.  [name] Tobacco abuse     Past Surgical History:  Procedure Laterality Date  . BRAIN SURGERY  2004   Crainotomy for tumor  . CARDIAC CATHETERIZATION N/A 03/21/2015   Procedure: Left Heart Cath and Coronary Angiography;  Surgeon: Sherren Mocha, MD;  Location: [place] CV LAB;  Service: Cardiovascular;  Laterality: N/A;  . KNEE ARTHROSCOPY Right 2005 approx   cartliage  . ULNAR NERVE TRANSPOSITION Left 06/25/2014   Procedure: LEFT ULNAR NEUROPLASTY AT ELBOW;  Surgeon: Jolyn Nap, MD;  Location: [place];  Service: Orthopedics;  Laterality: Left;  . WISDOM TOOTH EXTRACTION       Medications: Current Meds  Medication Sig  . aspirin EC 81 MG EC tablet Take  1 tablet (81 mg total) by mouth daily.  . carvedilol (COREG) 3.125 MG tablet Take 3.125 mg by mouth 2 (two) times daily with a meal.  . citalopram (CELEXA) 40 MG tablet Take 40 mg by mouth every morning.  . clonazePAM (KLONOPIN) 0.5 MG tablet Take 0.5 mg by mouth 2 (two) times daily.   . cyproheptadine (PERIACTIN) 4 MG tablet Take 4 mg by mouth 3 (three) times daily as needed (headache).   [name] EMGALITY 120 MG/ML SOAJ Inject 120 mg into the skin every 30 (thirty) days.   . famotidine (PEPCID) 20 MG tablet TAKE 1 TABLET BY MOUTH EVERY EVENING  . levETIRAcetam (KEPPRA XR) 500 MG 24 hr tablet Take 1,500 mg by mouth daily.  [name] levETIRAcetam (KEPPRA XR) 500 MG 24 hr tablet Take 2,000 mg by mouth daily.   [name] lisinopril (PRINIVIL,ZESTRIL) 2.5 MG tablet Take 2.5 mg by mouth daily.  . nitroGLYCERIN  (NITROSTAT) 0.4 MG SL tablet Place 1 tablet (0.4 mg total) under the tongue every 5 (five) minutes as needed for chest pain (x 3 pills daily).  [name] oxyCODONE-acetaminophen (ROXICET) 5-325 MG per tablet Take 1-2 tablets by mouth every 4 (four) hours as needed for severe pain.  . pantoprazole (PROTONIX) 40 MG tablet Take 40 mg by mouth daily.  . promethazine (PHENERGAN) 12.5 MG tablet Take 12.5 mg by mouth every 8 (eight) hours as needed for nausea.   . rosuvastatin (CRESTOR) 40 MG tablet TAKE 1 TABLET BY MOUTH EVERY DAY  . [DISCONTINUED] carvedilol (COREG) 3.125 MG tablet Take 1 tablet (3.125 mg total) by mouth 2 (two) times daily with a meal. Please keep upcoming appt for future refills. Thank you. (Patient taking differently: Take 3.125 mg by mouth 2 (two) times daily with a meal. )  . [DISCONTINUED] lisinopril (PRINIVIL,ZESTRIL) 2.5 MG tablet Take 1 tablet (2.5 mg total) by mouth daily. Please keep upcoming appt for future refills. Thank you. (Patient taking differently: Take 2.5 mg by mouth daily. )  . [DISCONTINUED] pantoprazole (PROTONIX) 40 MG tablet Take 2 tablets (80 mg total) by mouth daily. Please keep upcoming appt for future refills. Thank you. (Patient taking differently: Take 80 mg by mouth daily. )     Allergies: Allergies  Allergen Reactions  . Zithromax [Azithromycin] Hives    Social History: The patient  reports that he has been smoking cigarettes. He has a 10.00 pack-year smoking history. He has never used smokeless tobacco. He reports that he does not drink alcohol or use drugs.   Family History: The patient's family history includes Healthy in his mother, sister, and sister; Heart disease in his father; Stroke in his father.   Review of Systems: Please see the history of present illness.   Otherwise, the review of systems is positive for none.   All other systems are reviewed and negative.   Physical Exam: VS:  BP (!) 88/62 (BP Location: Left Arm, Patient Position:  Sitting, Cuff Size: Large)   Pulse 74   Ht 6' (1.829 m)   Wt (!) 381 lb 1.9 oz (172.9 kg)   BMI 51.69 kg/m  .  BMI Body mass index is 51.69 kg/m.  Wt Readings from Last 3 Encounters:  12/10/18 (!) 381 lb 1.9 oz (172.9 kg)  09/03/17 (!) 359 lb 9.6 oz (163.1 kg)  06/22/16 (!) 318 lb 6.4 oz (144.4 kg)   Repeat BP is 100/60 by me with a large cuff.  General: He is morbidly obese. Alert and in no acute distress.  His weight has escalated.  HEENT: Normal.  Neck: Supple, no JVD, carotid bruits, or masses noted.  Cardiac: Heart tones are quite distant. No edema.  Respiratory:  Decreased breath sounds but with normal work of breathing.  GI: Obese.  MS: No deformity or atrophy. Gait and ROM intact.  Skin: Warm and dry. Color is normal.  Neuro:  Strength and sensation are intact and no gross focal deficits noted.  Psych: Alert, appropriate and with normal affect.   LABORATORY DATA:  EKG:  EKG is ordered today. This demonstrates NSR.  Lab Results  Component Value Date   WBC 6.4 03/29/2015   HGB 15.8 03/29/2015   HCT 45.1 03/29/2015   PLT 209.0 03/29/2015   GLUCOSE 101 (H) 09/02/2017   CHOL 129 09/02/2017   TRIG 122 09/02/2017   HDL 39 (L) 09/02/2017   LDLCALC 66 09/02/2017   ALT 17 09/02/2017   AST 21 09/02/2017   NA 142 09/02/2017   K 4.6 09/02/2017   CL 103 09/02/2017   CREATININE 0.98 09/02/2017   BUN 12 09/02/2017   CO2 23 09/02/2017   HGBA1C 6.1 (H) 03/22/2015       BNP (last 3 results) No results for input(s): BNP in the last 8760 hours.  ProBNP (last 3 results) No results for input(s): PROBNP in the last 8760 hours.   Other Studies Reviewed Today:  Echo Study Conclusions 03/2015  - Left ventricle: The cavity size was normal. There was moderate   focal basal hypertrophy of the septum. Systolic function was   normal. The estimated ejection fraction was in the range of 50%   to 55%. Definity contrast was administered - there is clear   distal anterior,  apical and inferoapical severe hypokinesis to   dyskinesis. No thrombus was noted. The LV base is hyperdynamic.   Doppler parameters are consistent with abnormal left ventricular   relaxation (grade 1 diastolic dysfunction). The E/e&' ratio is   between 8-15, suggesting indeterminate LV filing pressure. - Mitral valve: Mildly thickened leaflets . There was trivial   regurgitation. - Left atrium: The atrium was normal in size.  Impressions:  - LVEF 50-55%, hyperdynamic base with distal anterior, apical and   inferoapical severe hypokinesis to dyskinesis, diastolic   dysfunction, indeterminate LV filling pressure, normal LA size.   Left Heart Cath and Coronary Angiography 03/2015  Conclusion   1. Total occlusion of the mid-LAD treated successfully with PTCA, aspiration thrombectomy, and a Synergy DES. 2. Moderate residual diffuse LAD stenosis 3. Moderate segmental LV systolic dysfunction, LVEF estimated at 40%  PLAN:   2D Echo  ASA/plavix x 12 months as tolerated (**review with neurology/neurosurgery**)  Aggressive risk reduction     Assessment/Plan:   1. CAD - prior NSTEMI with PCI - fortunately no chest pain but not able to do any CV risk factor modification. Overall prognosis tenuous going forward.   2. Prior LV dysfunction/ICM - recovered by last echo - no symptoms noted.   3. HLD - on statin - labs by PCP noted. LDL is 77  4. OSA - to be on CPAP  5. Obesity - weight continues to climb - he is not able to do any form of exercise at this time.   6. Tobacco abuse - back smoking - he is going to try and taper - total cessation recommended.   7. Carotid disease - will need to follow.   8. Epidermoid cyst - with subsequent chronic seizure disorder and migraines. [facility] at [facility].  Current medicines are reviewed with the patient today.  The patient does not have concerns regarding medicines other than what has been noted above.  The following changes have been  made:  See above.  Labs/ tests ordered today include:    Orders Placed This Encounter  Procedures  . EKG 12-Lead     Disposition:   FU with [place] in 6 months  Patient is agreeable to this plan and will call if any problems develop in the interim.   SignedTruitt Merle, NP  12/10/2018 10:07 AM  [facility] [address] [place], [place]  [address] Phone: [phone] Fax: [phone]

## 2018-12-22 DIAGNOSIS — G4733 Obstructive sleep apnea (adult) (pediatric): Secondary | ICD-10-CM | POA: Diagnosis not present

## 2019-01-08 ENCOUNTER — Other Ambulatory Visit: Payer: Self-pay | Admitting: Interventional Cardiology

## 2019-01-20 DIAGNOSIS — G4733 Obstructive sleep apnea (adult) (pediatric): Secondary | ICD-10-CM | POA: Diagnosis not present

## 2019-02-06 DIAGNOSIS — G4733 Obstructive sleep apnea (adult) (pediatric): Secondary | ICD-10-CM | POA: Diagnosis not present

## 2019-02-20 DIAGNOSIS — G4733 Obstructive sleep apnea (adult) (pediatric): Secondary | ICD-10-CM | POA: Diagnosis not present

## 2019-02-23 DIAGNOSIS — I2510 Atherosclerotic heart disease of native coronary artery without angina pectoris: Secondary | ICD-10-CM | POA: Diagnosis not present

## 2019-02-23 DIAGNOSIS — F324 Major depressive disorder, single episode, in partial remission: Secondary | ICD-10-CM | POA: Diagnosis not present

## 2019-02-23 DIAGNOSIS — I252 Old myocardial infarction: Secondary | ICD-10-CM | POA: Diagnosis not present

## 2019-02-23 DIAGNOSIS — I5032 Chronic diastolic (congestive) heart failure: Secondary | ICD-10-CM | POA: Diagnosis not present

## 2019-02-26 ENCOUNTER — Other Ambulatory Visit: Payer: Self-pay | Admitting: Interventional Cardiology

## 2019-02-26 DIAGNOSIS — I2510 Atherosclerotic heart disease of native coronary artery without angina pectoris: Secondary | ICD-10-CM

## 2019-03-12 ENCOUNTER — Other Ambulatory Visit: Payer: Self-pay | Admitting: Interventional Cardiology

## 2019-03-22 DIAGNOSIS — G4733 Obstructive sleep apnea (adult) (pediatric): Secondary | ICD-10-CM | POA: Diagnosis not present

## 2019-03-24 DIAGNOSIS — R569 Unspecified convulsions: Secondary | ICD-10-CM | POA: Diagnosis not present

## 2019-03-24 DIAGNOSIS — Z9989 Dependence on other enabling machines and devices: Secondary | ICD-10-CM | POA: Diagnosis not present

## 2019-03-24 DIAGNOSIS — G40919 Epilepsy, unspecified, intractable, without status epilepticus: Secondary | ICD-10-CM | POA: Diagnosis not present

## 2019-03-24 DIAGNOSIS — Z79899 Other long term (current) drug therapy: Secondary | ICD-10-CM | POA: Diagnosis not present

## 2019-03-24 DIAGNOSIS — G3184 Mild cognitive impairment, so stated: Secondary | ICD-10-CM | POA: Diagnosis not present

## 2019-03-24 DIAGNOSIS — G4730 Sleep apnea, unspecified: Secondary | ICD-10-CM | POA: Diagnosis not present

## 2019-03-24 DIAGNOSIS — Z9889 Other specified postprocedural states: Secondary | ICD-10-CM | POA: Diagnosis not present

## 2019-03-24 DIAGNOSIS — G43809 Other migraine, not intractable, without status migrainosus: Secondary | ICD-10-CM | POA: Diagnosis not present

## 2019-04-01 DIAGNOSIS — G4733 Obstructive sleep apnea (adult) (pediatric): Secondary | ICD-10-CM | POA: Diagnosis not present

## 2019-04-09 DIAGNOSIS — Z79899 Other long term (current) drug therapy: Secondary | ICD-10-CM | POA: Diagnosis not present

## 2019-04-09 DIAGNOSIS — F419 Anxiety disorder, unspecified: Secondary | ICD-10-CM | POA: Diagnosis not present

## 2019-04-09 DIAGNOSIS — G43909 Migraine, unspecified, not intractable, without status migrainosus: Secondary | ICD-10-CM | POA: Diagnosis not present

## 2019-04-09 DIAGNOSIS — J309 Allergic rhinitis, unspecified: Secondary | ICD-10-CM | POA: Diagnosis not present

## 2019-04-22 DIAGNOSIS — G4733 Obstructive sleep apnea (adult) (pediatric): Secondary | ICD-10-CM | POA: Diagnosis not present

## 2019-04-23 DIAGNOSIS — G44001 Cluster headache syndrome, unspecified, intractable: Secondary | ICD-10-CM | POA: Diagnosis not present

## 2019-04-27 DIAGNOSIS — L720 Epidermal cyst: Secondary | ICD-10-CM | POA: Diagnosis not present

## 2019-04-29 DIAGNOSIS — G43909 Migraine, unspecified, not intractable, without status migrainosus: Secondary | ICD-10-CM | POA: Diagnosis not present

## 2019-04-29 DIAGNOSIS — G930 Cerebral cysts: Secondary | ICD-10-CM | POA: Diagnosis not present

## 2019-04-29 DIAGNOSIS — R569 Unspecified convulsions: Secondary | ICD-10-CM | POA: Diagnosis not present

## 2019-05-22 DIAGNOSIS — G4733 Obstructive sleep apnea (adult) (pediatric): Secondary | ICD-10-CM | POA: Diagnosis not present

## 2019-06-17 ENCOUNTER — Other Ambulatory Visit: Payer: Self-pay | Admitting: Interventional Cardiology

## 2019-06-22 DIAGNOSIS — G4733 Obstructive sleep apnea (adult) (pediatric): Secondary | ICD-10-CM | POA: Diagnosis not present

## 2019-06-25 DIAGNOSIS — G4730 Sleep apnea, unspecified: Secondary | ICD-10-CM | POA: Diagnosis not present

## 2019-06-25 DIAGNOSIS — G43119 Migraine with aura, intractable, without status migrainosus: Secondary | ICD-10-CM | POA: Diagnosis not present

## 2019-06-25 DIAGNOSIS — R569 Unspecified convulsions: Secondary | ICD-10-CM | POA: Diagnosis not present

## 2019-06-25 DIAGNOSIS — Z79899 Other long term (current) drug therapy: Secondary | ICD-10-CM | POA: Diagnosis not present

## 2019-06-30 DIAGNOSIS — G4733 Obstructive sleep apnea (adult) (pediatric): Secondary | ICD-10-CM | POA: Diagnosis not present

## 2019-07-04 ENCOUNTER — Other Ambulatory Visit: Payer: Self-pay | Admitting: Interventional Cardiology

## 2019-07-15 DIAGNOSIS — G43709 Chronic migraine without aura, not intractable, without status migrainosus: Secondary | ICD-10-CM | POA: Diagnosis not present

## 2019-07-23 DIAGNOSIS — G4733 Obstructive sleep apnea (adult) (pediatric): Secondary | ICD-10-CM | POA: Diagnosis not present

## 2019-08-22 DIAGNOSIS — G4733 Obstructive sleep apnea (adult) (pediatric): Secondary | ICD-10-CM | POA: Diagnosis not present

## 2019-08-28 ENCOUNTER — Other Ambulatory Visit: Payer: Self-pay | Admitting: Interventional Cardiology

## 2019-08-28 DIAGNOSIS — I2510 Atherosclerotic heart disease of native coronary artery without angina pectoris: Secondary | ICD-10-CM

## 2019-09-17 DIAGNOSIS — F324 Major depressive disorder, single episode, in partial remission: Secondary | ICD-10-CM | POA: Diagnosis not present

## 2019-09-17 DIAGNOSIS — I5032 Chronic diastolic (congestive) heart failure: Secondary | ICD-10-CM | POA: Diagnosis not present

## 2019-09-17 DIAGNOSIS — I2510 Atherosclerotic heart disease of native coronary artery without angina pectoris: Secondary | ICD-10-CM | POA: Diagnosis not present

## 2019-09-17 DIAGNOSIS — I252 Old myocardial infarction: Secondary | ICD-10-CM | POA: Diagnosis not present

## 2019-09-22 DIAGNOSIS — G4733 Obstructive sleep apnea (adult) (pediatric): Secondary | ICD-10-CM | POA: Diagnosis not present

## 2019-09-28 DIAGNOSIS — G4733 Obstructive sleep apnea (adult) (pediatric): Secondary | ICD-10-CM | POA: Diagnosis not present

## 2019-10-22 DIAGNOSIS — F321 Major depressive disorder, single episode, moderate: Secondary | ICD-10-CM | POA: Diagnosis not present

## 2019-10-22 DIAGNOSIS — Z79899 Other long term (current) drug therapy: Secondary | ICD-10-CM | POA: Diagnosis not present

## 2019-10-22 DIAGNOSIS — G4700 Insomnia, unspecified: Secondary | ICD-10-CM | POA: Diagnosis not present

## 2019-10-22 DIAGNOSIS — R569 Unspecified convulsions: Secondary | ICD-10-CM | POA: Diagnosis not present

## 2019-10-22 DIAGNOSIS — G43019 Migraine without aura, intractable, without status migrainosus: Secondary | ICD-10-CM | POA: Diagnosis not present

## 2019-10-22 DIAGNOSIS — Z9989 Dependence on other enabling machines and devices: Secondary | ICD-10-CM | POA: Diagnosis not present

## 2019-10-22 DIAGNOSIS — G4733 Obstructive sleep apnea (adult) (pediatric): Secondary | ICD-10-CM | POA: Diagnosis not present

## 2019-10-22 DIAGNOSIS — G4730 Sleep apnea, unspecified: Secondary | ICD-10-CM | POA: Diagnosis not present

## 2019-11-10 DIAGNOSIS — Z720 Tobacco use: Secondary | ICD-10-CM | POA: Diagnosis not present

## 2019-11-10 DIAGNOSIS — F324 Major depressive disorder, single episode, in partial remission: Secondary | ICD-10-CM | POA: Diagnosis not present

## 2019-11-10 DIAGNOSIS — F419 Anxiety disorder, unspecified: Secondary | ICD-10-CM | POA: Diagnosis not present

## 2019-11-10 DIAGNOSIS — Z79899 Other long term (current) drug therapy: Secondary | ICD-10-CM | POA: Diagnosis not present

## 2019-11-10 DIAGNOSIS — R7303 Prediabetes: Secondary | ICD-10-CM | POA: Diagnosis not present

## 2019-11-10 DIAGNOSIS — I5032 Chronic diastolic (congestive) heart failure: Secondary | ICD-10-CM | POA: Diagnosis not present

## 2019-11-10 DIAGNOSIS — Z0000 Encounter for general adult medical examination without abnormal findings: Secondary | ICD-10-CM | POA: Diagnosis not present

## 2019-11-10 DIAGNOSIS — I2510 Atherosclerotic heart disease of native coronary artery without angina pectoris: Secondary | ICD-10-CM | POA: Diagnosis not present

## 2019-11-10 DIAGNOSIS — G43909 Migraine, unspecified, not intractable, without status migrainosus: Secondary | ICD-10-CM | POA: Diagnosis not present

## 2019-11-17 DIAGNOSIS — G43709 Chronic migraine without aura, not intractable, without status migrainosus: Secondary | ICD-10-CM | POA: Diagnosis not present

## 2019-11-22 DIAGNOSIS — G4733 Obstructive sleep apnea (adult) (pediatric): Secondary | ICD-10-CM | POA: Diagnosis not present

## 2019-11-24 ENCOUNTER — Other Ambulatory Visit: Payer: Self-pay | Admitting: Interventional Cardiology

## 2019-11-24 DIAGNOSIS — I2510 Atherosclerotic heart disease of native coronary artery without angina pectoris: Secondary | ICD-10-CM

## 2019-11-27 ENCOUNTER — Other Ambulatory Visit: Payer: Self-pay | Admitting: Nurse Practitioner

## 2019-12-04 DIAGNOSIS — I5032 Chronic diastolic (congestive) heart failure: Secondary | ICD-10-CM | POA: Diagnosis not present

## 2019-12-04 DIAGNOSIS — I252 Old myocardial infarction: Secondary | ICD-10-CM | POA: Diagnosis not present

## 2019-12-04 DIAGNOSIS — I2510 Atherosclerotic heart disease of native coronary artery without angina pectoris: Secondary | ICD-10-CM | POA: Diagnosis not present

## 2019-12-04 DIAGNOSIS — F324 Major depressive disorder, single episode, in partial remission: Secondary | ICD-10-CM | POA: Diagnosis not present

## 2019-12-06 ENCOUNTER — Other Ambulatory Visit: Payer: Self-pay | Admitting: Nurse Practitioner

## 2019-12-07 DIAGNOSIS — I2510 Atherosclerotic heart disease of native coronary artery without angina pectoris: Secondary | ICD-10-CM | POA: Diagnosis not present

## 2019-12-07 DIAGNOSIS — I5032 Chronic diastolic (congestive) heart failure: Secondary | ICD-10-CM | POA: Diagnosis not present

## 2019-12-07 DIAGNOSIS — I252 Old myocardial infarction: Secondary | ICD-10-CM | POA: Diagnosis not present

## 2019-12-07 DIAGNOSIS — F324 Major depressive disorder, single episode, in partial remission: Secondary | ICD-10-CM | POA: Diagnosis not present

## 2019-12-08 NOTE — Telephone Encounter (Signed)
Pt seen James Torres 12/10/2018. Ok to follow the protocol since he is just reaching the one year mark.  Needs to make an appt though.

## 2019-12-13 ENCOUNTER — Other Ambulatory Visit: Payer: Self-pay | Admitting: Nurse Practitioner

## 2019-12-13 ENCOUNTER — Other Ambulatory Visit: Payer: Self-pay | Admitting: Interventional Cardiology

## 2019-12-21 ENCOUNTER — Other Ambulatory Visit: Payer: Self-pay | Admitting: Interventional Cardiology

## 2019-12-21 DIAGNOSIS — I2510 Atherosclerotic heart disease of native coronary artery without angina pectoris: Secondary | ICD-10-CM

## 2019-12-23 DIAGNOSIS — G4733 Obstructive sleep apnea (adult) (pediatric): Secondary | ICD-10-CM | POA: Diagnosis not present

## 2019-12-29 ENCOUNTER — Other Ambulatory Visit: Payer: Self-pay | Admitting: Interventional Cardiology

## 2019-12-29 DIAGNOSIS — I2510 Atherosclerotic heart disease of native coronary artery without angina pectoris: Secondary | ICD-10-CM

## 2019-12-31 DIAGNOSIS — G43719 Chronic migraine without aura, intractable, without status migrainosus: Secondary | ICD-10-CM | POA: Diagnosis not present

## 2020-01-02 ENCOUNTER — Other Ambulatory Visit: Payer: Self-pay | Admitting: Interventional Cardiology

## 2020-01-05 DIAGNOSIS — G4733 Obstructive sleep apnea (adult) (pediatric): Secondary | ICD-10-CM | POA: Diagnosis not present

## 2020-01-10 ENCOUNTER — Other Ambulatory Visit: Payer: Self-pay | Admitting: Nurse Practitioner

## 2020-01-10 ENCOUNTER — Other Ambulatory Visit: Payer: Self-pay | Admitting: Interventional Cardiology

## 2020-01-21 DIAGNOSIS — G4730 Sleep apnea, unspecified: Secondary | ICD-10-CM | POA: Diagnosis not present

## 2020-01-21 DIAGNOSIS — R519 Headache, unspecified: Secondary | ICD-10-CM | POA: Diagnosis not present

## 2020-01-21 DIAGNOSIS — R569 Unspecified convulsions: Secondary | ICD-10-CM | POA: Diagnosis not present

## 2020-01-21 DIAGNOSIS — Z79899 Other long term (current) drug therapy: Secondary | ICD-10-CM | POA: Diagnosis not present

## 2020-01-26 ENCOUNTER — Other Ambulatory Visit: Payer: Self-pay | Admitting: Interventional Cardiology

## 2020-01-29 ENCOUNTER — Other Ambulatory Visit: Payer: Self-pay | Admitting: Interventional Cardiology

## 2020-01-29 DIAGNOSIS — I2510 Atherosclerotic heart disease of native coronary artery without angina pectoris: Secondary | ICD-10-CM

## 2020-01-29 NOTE — Telephone Encounter (Signed)
*  STAT* If patient is at the pharmacy, call can be transferred to refill team.   1. Which medications need to be refilled? (please list name of each medication and dose if known)    famotidine (PEPCID) 20 MG tablet  carvedilol (COREG) 3.125 MG tablet pantoprazole (PROTONIX) 40 MG tablet rosuvastatin (CRESTOR) 40 MG tablet  2. Which pharmacy/location (including street and city if local pharmacy) is medication to be sent to? [id number] - [place], [place] - [address]  3. Do they need a 30 day or 90 day supply? 90 day supply   Patient has a virtual appointment scheduled for 02/09/20 at 8:15 AM with Cecilie Kicks.

## 2020-02-01 MED ORDER — FAMOTIDINE 20 MG PO TABS: 20.0000 mg | ORAL_TABLET | Freq: Every day | ORAL | 0 refills | Status: DC

## 2020-02-01 MED ORDER — ROSUVASTATIN CALCIUM 40 MG PO TABS: 40.0000 mg | ORAL_TABLET | Freq: Every day | ORAL | 0 refills | Status: DC

## 2020-02-01 MED ORDER — PANTOPRAZOLE SODIUM 40 MG PO TBEC: 80.0000 mg | DELAYED_RELEASE_TABLET | Freq: Every day | ORAL | 0 refills | Status: DC

## 2020-02-01 MED ORDER — CARVEDILOL 3.125 MG PO TABS: 3.1250 mg | ORAL_TABLET | Freq: Two times a day (BID) | ORAL | 0 refills | Status: DC

## 2020-02-03 ENCOUNTER — Other Ambulatory Visit: Payer: Self-pay | Admitting: Interventional Cardiology

## 2020-02-08 NOTE — Progress Notes (Signed)
Virtual Visit via Telephone Note   This visit type was conducted due to national recommendations for restrictions regarding the COVID-19 Pandemic (e.g. social distancing) in an effort to limit this patient's exposure and mitigate transmission in our community.  Due to his co-morbid illnesses, this patient is at least at moderate risk for complications without adequate follow up.  This format is felt to be most appropriate for this patient at this time.  The patient did not have access to video technology/had technical difficulties with video requiring transitioning to audio format only (telephone).  All issues noted in this document were discussed and addressed.  No physical exam could be performed with this format.  Please refer to the patient's chart for his  consent to telehealth for [facility].   The patient was identified using 2 identifiers.  Date:  02/09/2020   ID:  James Torres, DOB [birth year], MRN [id number]  Patient Location: Home Provider Location: Office  PCP:  Lawerance Cruel, MD  Cardiologist:  Sinclair Grooms, MD  Electrophysiologist:  None   Evaluation Performed:  Follow-Up Visit  Chief Complaint:  CAD  History of Present Illness:    James Torres is a 54 y.o. male with history of CAD with prior NSTEMI with transient reduction in LV function to 35-45% recovering to 50-55% after PCI and stenting of the circumflex with a 38 mm long Synergy DES in May 2016. His other issues include prior tobacco abuse, HLD, obesity, OSA and anxiety. He has a history of a benign brain tumor/epidermoid cyst - has had gamma knife 10/2018- has chronic headaches, seizure disorder - now followed at [facility].   Visit October of 2018 - was felt to be doing well - not able to lose weight. Plavix was stopped. Not smoking.   VISIT 12/10/2018. He has had gamma knife for his epidermoid cyst - he has seizures and chronic migraines. He was found to have carotid disease noted with the work up -  moderate in nature.  He does not answer when asked about driving. His neurology care is all thru [facility] now. No chest pain. He is very inactive. Can't do really anything without a migraine. Weight has escalated dramatically. He is back smoking but trying to taper down. Needs medicines refilled. BP little low but he is totally asymptomatic. His labs were checked in December by his PCP - these are reviewed by the [facility].   Today 02/09/20 Last lipids.were a year ago at least.  He has been at home due to [place].  He is followed by Neuro at [facility] for migraines and seizures, he stated he no longer drives.  with stress he is smoking with tobacco 1ppd.  He has tried chantix and patches in past, plan to decrease by cutting back one every 2-3 days.    No chest pain and no SOB.  He does not have way to take BP - no EKG in a year.  He will plan to take COVID Vaccine.  The patient does not have symptoms concerning for COVID-19 infection (fever, chills, cough, or new shortness of breath).    Past Medical History:  Diagnosis Date  . Anxiety   . Brain tumor ([place])   . CAD (coronary artery disease)    a. 03/2015 NSTEMI: LM nl, LAD 50ost, 100p (2.75x38 Synergy DES), 75d, RI nl, LCX minor irregs, OM1 min irregs, RCA 50ost, EF 35-45%.  . Depression   . GERD (gastroesophageal reflux disease)   . Hyperlipidemia   .  Ischemic cardiomyopathy    a. 03/2015 EF 35-45% by LV gram @ time of NSTEMI;  b. 03/2015 Echo: EF 50-55%, sev HK to DK of dist an, apical, and infap walls.Gr 1 DD.  [name] Kidney stone   . Metabolic syndrome [id number]  . Migraines   . Morbid obesity ([place])   . Pre-diabetes   . Seizures ([place])    a. Has auras and metalic taste in mouth.  Last one 06/2014- last a few seconds, has one every couple weeks, Dr Mare Loan is neurologist and is aware.   . Sleep apnea 2009   a. On CPAP.  [name] Tobacco abuse    Past Surgical History:  Procedure Laterality Date  . BRAIN SURGERY  2004   Crainotomy for tumor  . CARDIAC  CATHETERIZATION N/A 03/21/2015   Procedure: Left Heart Cath and Coronary Angiography;  Surgeon: Sherren Mocha, MD;  Location: [place] CV LAB;  Service: Cardiovascular;  Laterality: N/A;  . KNEE ARTHROSCOPY Right 2005 approx   cartliage  . ULNAR NERVE TRANSPOSITION Left 06/25/2014   Procedure: LEFT ULNAR NEUROPLASTY AT ELBOW;  Surgeon: Jolyn Nap, MD;  Location: [place];  Service: Orthopedics;  Laterality: Left;  . WISDOM TOOTH EXTRACTION       Current Meds  Medication Sig  . aspirin EC 81 MG EC tablet Take 1 tablet (81 mg total) by mouth daily.  [name] CALCIUM CARBONATE-VITAMIN D PO Take 1 tablet by mouth daily with breakfast.  . carvedilol (COREG) 3.125 MG tablet Take 1 tablet (3.125 mg total) by mouth 2 (two) times daily with a meal.  . citalopram (CELEXA) 40 MG tablet Take 40 mg by mouth every morning.  . clonazePAM (KLONOPIN) 0.5 MG tablet Take 0.5 mg by mouth 2 (two) times daily.   . famotidine (PEPCID) 20 MG tablet Take 1 tablet (20 mg total) by mouth daily.  . Fremanezumab-vfrm (AJOVY) 225 MG/1.5ML SOAJ Inject 1 Dose into the skin every 30 (thirty) days.  [name] lamoTRIgine (LAMICTAL) 100 MG tablet Take 200 mg by mouth in the morning and at bedtime.  . levETIRAcetam (KEPPRA XR) 500 MG 24 hr tablet Take 2,000 mg by mouth daily.   [name] lisinopril (ZESTRIL) 2.5 MG tablet TAKE 1 TABLET BY MOUTH EVERY DAY  . nitroGLYCERIN (NITROSTAT) 0.4 MG SL tablet Place 1 tablet (0.4 mg total) under the tongue every 5 (five) minutes as needed for chest pain (x 3 pills daily).  [name] oxyCODONE-acetaminophen (ROXICET) 5-325 MG per tablet Take 1-2 tablets by mouth every 4 (four) hours as needed for severe pain.  . pantoprazole (PROTONIX) 40 MG tablet Take 2 tablets (80 mg total) by mouth daily. Please keep your upcoming appointment for any future refills. Thank you.  [name] Perampanel (FYCOMPA) 6 MG TABS Take 1 tablet by mouth daily.  . rosuvastatin (CRESTOR) 40 MG tablet Take 1 tablet (40 mg total) by mouth daily.  Please make overdue appt with Dr. Tamala Julian before anymore refills. 2nd attempt  . Ubrogepant (UBRELVY) 100 MG TABS Take 1-2 tablets by mouth as needed (HEADACHE).     Allergies:   Zithromax [azithromycin]   Social History   Tobacco Use  . Smoking status: Current Every Day Smoker    Packs/day: 0.50    Years: 20.00    Pack years: 10.00    Types: Cigarettes  . Smokeless tobacco: Never Used  Substance Use Topics  . Alcohol use: No    Alcohol/week: 0.0 standard drinks  . Drug use: No  Family Hx: The patient's family history includes Healthy in his mother, sister, and sister; Heart disease in his father; Stroke in his father. There is no history of Heart attack.  ROS:   Please see the history of present illness.    General:no colds or fevers, no weight changes Skin:no rashes or ulcers HEENT:no blurred vision, no congestion CV:see HPI PUL:see HPI GI:no diarrhea constipation or melena, no indigestion GU:no hematuria, no dysuria MS:no joint pain, no claudication Neuro:no syncope, no lightheadedness, + seizures, + migraines Endo:no diabetes, no thyroid disease  All other systems reviewed and are negative.   Prior CV studies:   The following studies were reviewed today:  Echo Study Conclusions 03/2015  - Left ventricle: The cavity size was normal. There was moderate focal basal hypertrophy of the septum. Systolic function was normal. The estimated ejection fraction was in the range of 50% to 55%. Definity contrast was administered - there is clear distal anterior, apical and inferoapical severe hypokinesis to dyskinesis. No thrombus was noted. The LV base is hyperdynamic. Doppler parameters are consistent with abnormal left ventricular relaxation (grade 1 diastolic dysfunction). The E/e&' ratio is between 8-15, suggesting indeterminate LV filing pressure. - Mitral valve: Mildly thickened leaflets . There was trivial regurgitation. - Left atrium: The  atrium was normal in size.  Impressions:  - LVEF 50-55%, hyperdynamic base with distal anterior, apical and inferoapical severe hypokinesis to dyskinesis, diastolic dysfunction, indeterminate LV filling pressure, normal LA size.   Left Heart Cath and Coronary Angiography 03/2015  Conclusion   1. Total occlusion of the mid-LAD treated successfully with PTCA, aspiration thrombectomy, and a Synergy DES. 2. Moderate residual diffuse LAD stenosis 3. Moderate segmental LV systolic dysfunction, LVEF estimated at 40%  PLAN:   2D Echo  ASA/plavix x 12 months as tolerated (**review with neurology/neurosurgery**)  Aggressive risk reduction    Labs/Other Tests and Data Reviewed:    EKG:  No ECG reviewed. last one 12/2018 pt will come in for EKG.   Recent Labs: No results found for requested labs within last 8760 hours.   Recent Lipid Panel Lab Results  Component Value Date/Time   CHOL 129 09/02/2017 10:06 AM   TRIG 122 09/02/2017 10:06 AM   HDL 39 (L) 09/02/2017 10:06 AM   CHOLHDL 3.3 09/02/2017 10:06 AM   CHOLHDL 3.2 06/21/2016 12:36 PM   LDLCALC 66 09/02/2017 10:06 AM    Wt Readings from Last 3 Encounters:  02/09/20 (!) 385 lb (174.6 kg)  12/10/18 (!) 381 lb 1.9 oz (172.9 kg)  09/03/17 (!) 359 lb 9.6 oz (163.1 kg)     Objective:    Vital Signs:  Ht 6' (1.829 m)   Wt (!) 385 lb (174.6 kg)   BMI 52.22 kg/m    VITAL SIGNS:  reviewed  General NAD Pulmonary can speak in complete sentences without SOB Neuro A&O X 3 answers questions approp   ASSESSMENT & PLAN:    1. CAD with hx of MI and PCI - no angina.  Continue medications ASA, statin, BB and ACE no EKG for > 1 year, follow up in 6 months or if he can come in for EKG in next 3-4 weeks will have him follow up in 1 year. 2. Prior LV dysfunction now resolved with last echo.no SOB, continue meds as above 3. HLD on statin no labs in some time.  When he comes in for EKG then check CMP and Lipids  4. Tobacco  abuse at 1ppd, discussed he will begin  decreasing  5. Seizures, migraines per neurologist at [facility]. And epidermoid cyst 6. Carotid disease by CTA of neck; on statin, continue will need follow up 7. OSA per PCP  COVID-19 Education: The signs and symptoms of COVID-19 were discussed with the patient and how to seek care for testing (follow up with PCP or arrange E-visit).  The importance of social distancing was discussed today.  Time:   Today, I have spent 12 minutes with the patient with telehealth technology discussing the above problems.     Medication Adjustments/Labs and Tests Ordered: Current medicines are reviewed at length with the patient today.  Concerns regarding medicines are outlined above.   Tests Ordered: No orders of the defined types were placed in this encounter.   Medication Changes: No orders of the defined types were placed in this encounter.   Follow Up:  In Person in 6 month(s)  Signed, Cecilie Kicks, NP  02/09/2020 10:53 AM    [place]

## 2020-02-09 ENCOUNTER — Telehealth: Payer: Self-pay | Admitting: *Deleted

## 2020-02-09 ENCOUNTER — Other Ambulatory Visit: Payer: Self-pay

## 2020-02-09 ENCOUNTER — Encounter: Payer: Self-pay | Admitting: Cardiology

## 2020-02-09 ENCOUNTER — Telehealth (INDEPENDENT_AMBULATORY_CARE_PROVIDER_SITE_OTHER): Payer: Medicare PPO | Admitting: Cardiology

## 2020-02-09 VITALS — Ht 72.0 in | Wt 385.0 lb

## 2020-02-09 DIAGNOSIS — Z720 Tobacco use: Secondary | ICD-10-CM

## 2020-02-09 DIAGNOSIS — I6523 Occlusion and stenosis of bilateral carotid arteries: Secondary | ICD-10-CM

## 2020-02-09 DIAGNOSIS — I2510 Atherosclerotic heart disease of native coronary artery without angina pectoris: Secondary | ICD-10-CM | POA: Diagnosis not present

## 2020-02-09 DIAGNOSIS — I519 Heart disease, unspecified: Secondary | ICD-10-CM

## 2020-02-09 DIAGNOSIS — R569 Unspecified convulsions: Secondary | ICD-10-CM

## 2020-02-09 DIAGNOSIS — E782 Mixed hyperlipidemia: Secondary | ICD-10-CM

## 2020-02-09 NOTE — Telephone Encounter (Signed)
Left message for pt letting him know that Cecilie Kicks, NP, has decided just to make him a recall for an in-office appt in 6 months and we will get EKG and labs at that time.

## 2020-02-09 NOTE — Patient Instructions (Signed)
Medication Instructions:  Your physician recommends that you continue on your current medications as directed. Please refer to the Current Medication list given to you today.  *If you need a refill on your cardiac medications before your next appointment, please call your pharmacy*   Lab Work: NEED TO SCHEDULE:  CMP & LIPID  If you have labs (blood work) drawn today and your tests are completely normal, you will receive your results only by: [name] MyChart Message (if you have MyChart) OR . A paper copy in the mail If you have any lab test that is abnormal or we need to change your treatment, we will call you to review the results.   Testing/Procedures: Need to set up a time to come in for BP check and EKG.  You can call the office and ask for Rico Junker or you can send a mychart message directly to me and we can arrange this.   Follow-Up: At [facility], you and your health needs are our priority.  As part of our continuing mission to provide you with exceptional heart care, we have created designated Provider Care Teams.  These Care Teams include your primary Cardiologist (physician) and Advanced Practice Providers (APPs -  Physician Assistants and Nurse Practitioners) who all work together to provide you with the care you need, when you need it.  We recommend signing up for the patient portal called "MyChart".  Sign up information is provided on this After Visit Summary.  MyChart is used to connect with patients for Virtual Visits (Telemedicine).  Patients are able to view lab/test results, encounter notes, upcoming appointments, etc.  Non-urgent messages can be sent to your provider as well.   To learn more about what you can do with MyChart, go to [email or web address].    Your next appointment:  DEPENDS ON IF AND WHEN WE CAN GET THE EKG, BP CHECK AND LABS DONE

## 2020-02-09 NOTE — Telephone Encounter (Signed)
   Your [facility] team (Cardiologist [Heart Doctor] and Advanced Practice Provider [Physician Assistant; Nurse Practitioner]) has arranged for your next office appointment to be a virtual visit (also known as "Telehealth", "Telemedicine", "E-Visit").   We now offer virtual visits for all our patients.  This helps us to expand our ability to see patients in a timely and safe manner.  These visits are billed to your insurance just like traditional, in person, appointments.  Please review this IMPORTANT information about your upcoming appointment.   **PLEASE READ THE SECTION BELOW LABELED "CONSENT".**   **CALL OUR OFFICE WITH QUESTIONS.**   WHAT YOU NEED FOR YOUR VIRTUAL VISIT:  You will need a SmartPhone with microphone and video capability.  [If you are using MyChart to connect to your visit, it is also possible to use a desktop/laptop computer (with an Internet connection), as long as you have microphone and video capability.]  You will need to use Chrome, Edge or Safari as your default web browser.  We highly recommend that you have a MyChart account as this will make connecting to your visit seamless.  A MyChart account not only allows you to connect to your provider for a virtual visit, but also allows you to see the results of all your tests, provider notes, medications and upcoming appointments.    A MyChart account is not absolutely necessary.  We can still complete your visit if you do not have one.    If you do not have a computer or SmartPhone with video/microphone capability or your Internet/cell service is weak on the day of your visit, we will do your visit by telephone.    A blood pressure cuff and scale are essential to collect your vital signs at home.  If you do not have these and you are unable to obtain them, please contact our office so that we can make arrangements for you.  If you have a pulse oximeter, Apple watch, Kardia mobile device, etc, you can collect data  from these devices as well to share with the provider for your visit.  These devices are not required for a virtual visit.    WHAT TO DO ON THE DAY OF YOUR APPOINTMENT: 30 minutes before your appointment:  Take your blood pressure, pulse or heart rate (if your blood pressure machine is able to collect it) and weight.  Write all these numbers down so you can give it to the nurse or medical assistant that calls.  Get all of the medications you currently take and put them where you will be sitting for the appointment.  The nurse/medical assistant will go over these with you when he/she calls.  15 minutes before your appointment:  You will receive a phone call from a nurse or medical assistant from our office.  The caller ID on your phone may indicate that the caller is either "[facility]" or "[place]".  However, the number may come across as spam.  Please turn off any spam blocker so you do not miss the call.  The nurse will:  Ask for your blood pressure, pulse, weight, height.  Go over all of your medications to make sure your chart is correct.  Review your allergies, smoking history, reason for appointment, etc.   Give you instructions on how to connect with the video platform or telephone.  IF YOUR VISIT IS BY TELEPHONE ONLY: After the nurse finishes getting you ready for the visit, your provider will call you on the phone   number you provide to us.   TO CONNECT WITH YOUR PROVIDER FOR YOUR APPOINTMENT (BY VIDEO): You will either connect with the provider with your MyChart account or (if you are not using MyChart) with a link sent to your SmartPhone by text message.  If you are using MyChart, see below.  If you are not using MyChart, the nurse will send you the text message during the phone call.     If you are connecting with your MyChart account:   (The nurse that calls you will tell you when to do this):  You will log into your account. At the top of your home screen, you  should see the following prompt that tells you to "Begin your video visit with . . . ".  Click the green button (BEGIN VISIT).    The next screen will say "It's time to start your video visit!" Click the green button (BEGIN VIDEO VISIT)    There may be a screen that appears that asks for permission to use your camera and/or microphone.  Click ALLOW.  This will open the browser where your appointment will take place.   You may see the message: "Welcome.  Waiting for the call to begin."  Or, you will see that the nurse or your provider are already "in" the room waiting on you.   If you are connecting with a link sent to your SmartPhone via text: The nurse that calls you will send the link by text message. Click on the link (it should look something like this):     If asked to give permission to use the camera and or microphone, click ALLOW This will open the browser where your appointment will take place.      You may see the message: "Welcome.  Waiting for the call to begin."  Or, you will see that the nurse or your provider are already "in" the room waiting on you.    The controls for your visit look like the picture below.  Please note that this is what the microphone and camera look like when they are ON.  If muted, they will have a line through them.    After the appointment: Once your provider leaves the appointment, he/she will go over instructions with the nurse/medical assistant.  If needed, this person will call you with any instructions, appointments, etc.   A copy of your After Visit Summary (AVS) will be available later that day in your MyChart account.  This document will have all of your instructions, medications, appointments, etc.  If you are not using MyChart, we will mail it to your home.     *CONSENT FOR TELE-HEALTH VISIT - PLEASE REVIEW* By participating in the scheduled virtual visit (and any virtual visit scheduled within 365 days of the printing of this  document), I agree to the following:   I hereby voluntarily request, consent and authorize [facility] and its employed or contracted physicians, physician assistants, nurse practitioners or other licensed health care professionals (the Practitioner), to provide me with telemedicine health care services (the "Services") as deemed necessary by the treating Practitioner. I acknowledge and consent to receive the Services by the Practitioner via telemedicine. I understand that the telemedicine visit will involve communicating with the Practitioner through live audiovisual communication technology and the disclosure of certain medical information by electronic transmission. I acknowledge that I have been given the opportunity to request an in-person assessment or other available alternative prior to the telemedicine visit   and am voluntarily participating in the telemedicine visit.  I understand that I have the right to withhold or withdraw my consent to the use of telemedicine in the course of my care at any time, without affecting my right to future care or treatment, and that the Practitioner or I may terminate the telemedicine visit at any time. I understand that I have the right to inspect all information obtained and/or recorded in the course of the telemedicine visit and may receive copies of available information for a reasonable fee.  I understand that some of the potential risks of receiving the Services via telemedicine include:  . Delay or interruption in medical evaluation due to technological equipment failure or disruption; . Information transmitted may not be sufficient (e.g. poor resolution of images) to allow for appropriate medical decision making by the Practitioner; and/or  . In rare instances, security protocols could fail, causing a breach of personal health information.  Furthermore, I acknowledge that it is my responsibility to provide information about my medical history, conditions  and care that is complete and accurate to the best of my ability. I acknowledge that Practitioner's advice, recommendations, and/or decision may be based on factors not within their control, such as incomplete or inaccurate data provided by me or distortions of diagnostic images or specimens that may result from electronic transmissions. I understand that the practice of medicine is not an exact science and that Practitioner makes no warranties or guarantees regarding treatment outcomes. I acknowledge that a copy of this consent can be made available to me via my patient portal ([facility] MyChart), or I can request a printed copy by calling the office of [facility].    I understand that my insurance will be billed for this visit.   I have read or had this consent read to me. . I understand the contents of this consent, which adequately explains the benefits and risks of the Services being provided via telemedicine.  . I have been provided ample opportunity to ask questions regarding this consent and the Services and have had my questions answered to my satisfaction. . I give my informed consent for the services to be provided through the use of telemedicine in my medical care   

## 2020-02-23 ENCOUNTER — Other Ambulatory Visit: Payer: Self-pay | Admitting: Nurse Practitioner

## 2020-03-11 ENCOUNTER — Other Ambulatory Visit: Payer: Self-pay | Admitting: Nurse Practitioner

## 2020-03-21 DIAGNOSIS — I252 Old myocardial infarction: Secondary | ICD-10-CM | POA: Diagnosis not present

## 2020-03-21 DIAGNOSIS — I2510 Atherosclerotic heart disease of native coronary artery without angina pectoris: Secondary | ICD-10-CM | POA: Diagnosis not present

## 2020-03-21 DIAGNOSIS — I5032 Chronic diastolic (congestive) heart failure: Secondary | ICD-10-CM | POA: Diagnosis not present

## 2020-03-21 DIAGNOSIS — F324 Major depressive disorder, single episode, in partial remission: Secondary | ICD-10-CM | POA: Diagnosis not present

## 2020-03-25 ENCOUNTER — Other Ambulatory Visit: Payer: Self-pay | Admitting: Interventional Cardiology

## 2020-03-25 DIAGNOSIS — I2510 Atherosclerotic heart disease of native coronary artery without angina pectoris: Secondary | ICD-10-CM

## 2020-03-31 DIAGNOSIS — G43719 Chronic migraine without aura, intractable, without status migrainosus: Secondary | ICD-10-CM | POA: Diagnosis not present

## 2020-03-31 DIAGNOSIS — G43709 Chronic migraine without aura, not intractable, without status migrainosus: Secondary | ICD-10-CM | POA: Diagnosis not present

## 2020-04-05 DIAGNOSIS — G4733 Obstructive sleep apnea (adult) (pediatric): Secondary | ICD-10-CM | POA: Diagnosis not present

## 2020-04-20 DIAGNOSIS — G43909 Migraine, unspecified, not intractable, without status migrainosus: Secondary | ICD-10-CM | POA: Diagnosis not present

## 2020-04-20 DIAGNOSIS — R569 Unspecified convulsions: Secondary | ICD-10-CM | POA: Diagnosis not present

## 2020-04-20 DIAGNOSIS — G930 Cerebral cysts: Secondary | ICD-10-CM | POA: Diagnosis not present

## 2020-05-16 DIAGNOSIS — F324 Major depressive disorder, single episode, in partial remission: Secondary | ICD-10-CM | POA: Diagnosis not present

## 2020-05-16 DIAGNOSIS — I5032 Chronic diastolic (congestive) heart failure: Secondary | ICD-10-CM | POA: Diagnosis not present

## 2020-05-16 DIAGNOSIS — I2510 Atherosclerotic heart disease of native coronary artery without angina pectoris: Secondary | ICD-10-CM | POA: Diagnosis not present

## 2020-05-16 DIAGNOSIS — I252 Old myocardial infarction: Secondary | ICD-10-CM | POA: Diagnosis not present

## 2020-07-05 DIAGNOSIS — G4733 Obstructive sleep apnea (adult) (pediatric): Secondary | ICD-10-CM | POA: Diagnosis not present

## 2020-08-04 DIAGNOSIS — I252 Old myocardial infarction: Secondary | ICD-10-CM | POA: Diagnosis not present

## 2020-08-04 DIAGNOSIS — K219 Gastro-esophageal reflux disease without esophagitis: Secondary | ICD-10-CM | POA: Diagnosis not present

## 2020-08-04 DIAGNOSIS — F324 Major depressive disorder, single episode, in partial remission: Secondary | ICD-10-CM | POA: Diagnosis not present

## 2020-08-04 DIAGNOSIS — I5032 Chronic diastolic (congestive) heart failure: Secondary | ICD-10-CM | POA: Diagnosis not present

## 2020-08-04 DIAGNOSIS — I2510 Atherosclerotic heart disease of native coronary artery without angina pectoris: Secondary | ICD-10-CM | POA: Diagnosis not present

## 2020-08-18 ENCOUNTER — Other Ambulatory Visit: Payer: Self-pay | Admitting: Nurse Practitioner

## 2020-09-08 DIAGNOSIS — Z23 Encounter for immunization: Secondary | ICD-10-CM | POA: Diagnosis not present

## 2020-09-08 DIAGNOSIS — Z125 Encounter for screening for malignant neoplasm of prostate: Secondary | ICD-10-CM | POA: Diagnosis not present

## 2020-09-08 DIAGNOSIS — G4730 Sleep apnea, unspecified: Secondary | ICD-10-CM | POA: Diagnosis not present

## 2020-09-08 DIAGNOSIS — E559 Vitamin D deficiency, unspecified: Secondary | ICD-10-CM | POA: Diagnosis not present

## 2020-09-08 DIAGNOSIS — R7303 Prediabetes: Secondary | ICD-10-CM | POA: Diagnosis not present

## 2020-09-08 DIAGNOSIS — F324 Major depressive disorder, single episode, in partial remission: Secondary | ICD-10-CM | POA: Diagnosis not present

## 2020-09-08 DIAGNOSIS — G40909 Epilepsy, unspecified, not intractable, without status epilepticus: Secondary | ICD-10-CM | POA: Diagnosis not present

## 2020-09-08 DIAGNOSIS — I2510 Atherosclerotic heart disease of native coronary artery without angina pectoris: Secondary | ICD-10-CM | POA: Diagnosis not present

## 2020-09-08 DIAGNOSIS — R519 Headache, unspecified: Secondary | ICD-10-CM | POA: Diagnosis not present

## 2020-09-08 DIAGNOSIS — R569 Unspecified convulsions: Secondary | ICD-10-CM | POA: Diagnosis not present

## 2020-09-08 DIAGNOSIS — F419 Anxiety disorder, unspecified: Secondary | ICD-10-CM | POA: Diagnosis not present

## 2020-09-08 DIAGNOSIS — Z9989 Dependence on other enabling machines and devices: Secondary | ICD-10-CM | POA: Diagnosis not present

## 2020-09-08 DIAGNOSIS — Z79899 Other long term (current) drug therapy: Secondary | ICD-10-CM | POA: Diagnosis not present

## 2020-09-08 DIAGNOSIS — G43909 Migraine, unspecified, not intractable, without status migrainosus: Secondary | ICD-10-CM | POA: Diagnosis not present

## 2020-09-08 DIAGNOSIS — Z1159 Encounter for screening for other viral diseases: Secondary | ICD-10-CM | POA: Diagnosis not present

## 2020-10-03 DIAGNOSIS — G4733 Obstructive sleep apnea (adult) (pediatric): Secondary | ICD-10-CM | POA: Diagnosis not present

## 2020-11-01 DIAGNOSIS — F324 Major depressive disorder, single episode, in partial remission: Secondary | ICD-10-CM | POA: Diagnosis not present

## 2020-11-01 DIAGNOSIS — I252 Old myocardial infarction: Secondary | ICD-10-CM | POA: Diagnosis not present

## 2020-11-01 DIAGNOSIS — I5032 Chronic diastolic (congestive) heart failure: Secondary | ICD-10-CM | POA: Diagnosis not present

## 2020-11-01 DIAGNOSIS — K219 Gastro-esophageal reflux disease without esophagitis: Secondary | ICD-10-CM | POA: Diagnosis not present

## 2020-11-01 DIAGNOSIS — I2510 Atherosclerotic heart disease of native coronary artery without angina pectoris: Secondary | ICD-10-CM | POA: Diagnosis not present

## 2020-12-01 DIAGNOSIS — G43019 Migraine without aura, intractable, without status migrainosus: Secondary | ICD-10-CM | POA: Diagnosis not present

## 2020-12-01 NOTE — Progress Notes (Signed)
Cardiology Office Note:    Date:  12/02/2020   ID:  James Torres, DOB [birth year], MRN [id number]  PCP:  Lawerance Cruel, MD  Cardiologist:  Sinclair Grooms, MD   Referring MD: Lawerance Cruel, MD   Chief Complaint  Patient presents with  . Coronary Artery Disease    History of Present Illness:    James Torres is a 55 y.o. male with a hx of CAD, non-ST elevation myocardial infarction with transient reduction in LV function to 35-45% recovering to 50-55% after PCI and stenting of the circumflex with a 38 mm long Synergy DES May 2016, hyperlipidemia, obesity, sleep apnea, and anxiety disorder.  2019 gamma knife therapy of benign brain tumor-epidermoid cyst.  He is not having cardiac symptoms.  He is having a lot of of his issues as noted above.  He denies orthopnea, PND, syncope, palpitations.  He had not been seen in person for greater than a year because of the COVID-19 pandemic and is concerned about the possibility of getting infected.  He denies angina.  No nitroglycerin use.  He passed along a request from Dr. Gwendel Hanson who wanted a copy of his EKG.  She also wanted [place] to increase the dose of carvedilol to help with the tremor that he has.  He is on 3.125 mg twice daily and we will increase to 6.25 mg twice daily.  Past Medical History:  Diagnosis Date  . Anxiety   . Brain tumor ([place])   . CAD (coronary artery disease)    a. 03/2015 NSTEMI: LM nl, LAD 50ost, 100p (2.75x38 Synergy DES), 75d, RI nl, LCX minor irregs, OM1 min irregs, RCA 50ost, EF 35-45%.  . Depression   . GERD (gastroesophageal reflux disease)   . Hyperlipidemia   . Ischemic cardiomyopathy    a. 03/2015 EF 35-45% by LV gram @ time of NSTEMI;  b. 03/2015 Echo: EF 50-55%, sev HK to DK of dist an, apical, and infap walls.Gr 1 DD.  [name] Kidney stone   . Metabolic syndrome 9/67/8938  . Migraines   . Morbid obesity ([place])   . Pre-diabetes   . Seizures ([place])    a. Has auras and metalic taste in mouth.   Last one 06/2014- last a few seconds, has one every couple weeks, Dr Mare Loan is neurologist and is aware.   . Sleep apnea 2009   a. On CPAP.  [name] Tobacco abuse     Past Surgical History:  Procedure Laterality Date  . BRAIN SURGERY  2004   Crainotomy for tumor  . CARDIAC CATHETERIZATION N/A 03/21/2015   Procedure: Left Heart Cath and Coronary Angiography;  Surgeon: Sherren Mocha, MD;  Location: [place] [facility];  Service: Cardiovascular;  Laterality: N/A;  . KNEE ARTHROSCOPY Right 2005 approx   cartliage  . ULNAR NERVE TRANSPOSITION Left 06/25/2014   Procedure: LEFT ULNAR NEUROPLASTY AT ELBOW;  Surgeon: Jolyn Nap, MD;  Location: [place];  Service: Orthopedics;  Laterality: Left;  . WISDOM TOOTH EXTRACTION      Current Medications: Current Meds  Medication Sig  . aspirin EC 81 MG EC tablet Take 1 tablet (81 mg total) by mouth daily.  [name] CALCIUM CARBONATE-VITAMIN D PO Take 1 tablet by mouth daily with breakfast.  . carvedilol (COREG) 6.25 MG tablet Take 1 tablet (6.25 mg total) by mouth 2 (two) times daily.  . citalopram (CELEXA) 40 MG tablet Take 40 mg by mouth every morning.  . clonazePAM (KLONOPIN)  0.5 MG tablet Take 0.5 mg by mouth 2 (two) times daily.   . famotidine (PEPCID) 20 MG tablet Take 1 tablet (20 mg total) by mouth daily.  . Fremanezumab-vfrm (AJOVY) 225 MG/1.5ML SOAJ Inject 1 Dose into the skin every 30 (thirty) days.  [name] lamoTRIgine (LAMICTAL) 100 MG tablet Take 200 mg by mouth in the morning and at bedtime.  . levETIRAcetam (KEPPRA XR) 500 MG 24 hr tablet Take 2,000 mg by mouth daily.   [name] lisinopril (ZESTRIL) 2.5 MG tablet TAKE 1 TABLET BY MOUTH EVERY DAY  . nitroGLYCERIN (NITROSTAT) 0.4 MG SL tablet Place 1 tablet (0.4 mg total) under the tongue every 5 (five) minutes as needed for chest pain (x 3 pills daily).  [name] oxyCODONE-acetaminophen (ROXICET) 5-325 MG per tablet Take 1-2 tablets by mouth every 4 (four) hours as needed for severe pain.  . pantoprazole  (PROTONIX) 40 MG tablet Take 2 tablets (80 mg total) by mouth daily.  . Perampanel (FYCOMPA) 6 MG TABS Take 1 tablet by mouth daily.  . rosuvastatin (CRESTOR) 40 MG tablet TAKE 1 TABLET (40 MG TOTAL) BY MOUTH DAILY.  [name] Ubrogepant (UBRELVY) 100 MG TABS Take 1-2 tablets by mouth as needed (HEADACHE).  . [DISCONTINUED] carvedilol (COREG) 3.125 MG tablet TAKE 1 TABLET (3.125 MG TOTAL) BY MOUTH 2 (TWO) TIMES DAILY WITH A MEAL.     Allergies:   Zithromax [azithromycin]   Social History   Socioeconomic History  . Marital status: Single    Spouse name: Not on file  . Number of children: Not on file  . Years of education: Not on file  . Highest education level: Not on file  Occupational History  . Not on file  Tobacco Use  . Smoking status: Current Every Day Smoker    Packs/day: 0.50    Years: 20.00    Pack years: 10.00    Types: Cigarettes  . Smokeless tobacco: Never Used  Vaping Use  . Vaping Use: Never used  Substance and Sexual Activity  . Alcohol use: No    Alcohol/week: 0.0 standard drinks  . Drug use: No  . Sexual activity: Not Currently  Other Topics Concern  . Not on file  Social History Narrative  . Not on file   Social Determinants of Health   Financial Resource Strain: Not on file  Food Insecurity: Not on file  Transportation Needs: Not on file  Physical Activity: Not on file  Stress: Not on file  Social Connections: Not on file     Family History: The patient's family history includes Healthy in his mother, sister, and sister; Heart disease in his father; Stroke in his father. There is no history of Heart attack.  ROS:   Please see the history of present illness.    He is gaining weight.  He now weighs nearly 400 pounds.  He is not getting physical activity.  He has terrible migraines.  He has a tremor.  He has seizures.  He has had gamma knife surgery for an epidermoid cyst all other systems reviewed and are negative.  EKGs/Labs/Other Studies Reviewed:     The following studies were reviewed today: No cardiac imaging has been done recently and will be difficult to do because of his phenotype/weight.  EKG:  EKG normal sinus rhythm, poor R wave progression V1 through V3.  Otherwise unremarkable.  No change compared to 12/2018.  Recent Labs: No results found for requested labs within last 8760 hours.  Recent Lipid Panel  Component Value Date/Time   CHOL 129 09/02/2017 1006   TRIG 122 09/02/2017 1006   HDL 39 (L) 09/02/2017 1006   CHOLHDL 3.3 09/02/2017 1006   CHOLHDL 3.2 06/21/2016 1236   VLDL 14 06/21/2016 1236   LDLCALC 66 09/02/2017 1006    Physical Exam:    VS:  BP 120/70 (BP Location: Left Arm, Patient Position: Sitting, Cuff Size: Normal)   Pulse 74   Ht 6' (1.829 m)   Wt (!) 397 lb (180.1 kg)   SpO2 95%   BMI 53.84 kg/m     Wt Readings from Last 3 Encounters:  12/02/20 (!) 397 lb (180.1 kg)  02/09/20 (!) 385 lb (174.6 kg)  12/10/18 (!) 381 lb 1.9 oz (172.9 kg)     GEN: Morbidly obese/super morbid obesity with BMI of 53.9.. No acute distress HEENT: Normal NECK: No JVD. LYMPHATICS: No lymphadenopathy CARDIAC: Distant heart sounds without murmur. RRR no gallop, or edema. VASCULAR:  Normal Pulses. No bruits. RESPIRATORY:  Clear to auscultation without rales, wheezing or rhonchi  ABDOMEN: Soft, non-tender, non-distended, No pulsatile mass, MUSCULOSKELETAL: No deformity  SKIN: Warm and dry NEUROLOGIC:  Alert and oriented x 3 PSYCHIATRIC:  Normal affect   ASSESSMENT:    1. Coronary artery disease involving native coronary artery of native heart without angina pectoris   2. Chronic diastolic heart failure ([place])   3. Mixed hyperlipidemia   4. Tobacco abuse   5. Bilateral carotid artery stenosis   6. Educated about COVID-19 virus infection   7. Tremor, hereditary, benign    PLAN:    In order of problems listed above:  1. Secondary prevention discussed.  Hemoglobin A1c was 6.7 in November.  Low carbohydrate  diet.  May need medical therapy for diabetes.  He denies smoking.  LDL cholesterol is 69 on max dose Crestor.  He is getting no physical activity. 2. No evidence of volume overload on exam 3. Continue high-dose Crestor 4. Denies tobacco use 5. No bruits are heard on auscultation. 6. COVID-19 mitigation measures are being practiced.  Not sure if he is vaccinated. 7. Carvedilol increased to 6.25 mg twice daily.  Overall education and awareness concerning secondary risk prevention was discussed in detail: LDL less than 70, hemoglobin A1c less than 7, blood pressure target less than 130/80 mmHg, >150 minutes of moderate aerobic activity per week, avoidance of smoking, weight control (via diet and exercise), and continued surveillance/management of/for obstructive sleep apnea.    Medication Adjustments/Labs and Tests Ordered: Current medicines are reviewed at length with the patient today.  Concerns regarding medicines are outlined above.  Orders Placed This Encounter  Procedures  . EKG 12-Lead   Meds ordered this encounter  Medications  . carvedilol (COREG) 6.25 MG tablet    Sig: Take 1 tablet (6.25 mg total) by mouth 2 (two) times daily.    Dispense:  180 tablet    Refill:  3    Dose change    Patient Instructions  Medication Instructions:  1) INCREASE Carvedilol to 6.25mg  twice daily  *If you need a refill on your cardiac medications before your next appointment, please call your pharmacy*   Lab Work: None If you have labs (blood work) drawn today and your tests are completely normal, you will receive your results only by: [name] MyChart Message (if you have MyChart) OR . A paper copy in the mail If you have any lab test that is abnormal or we need to change your treatment, we will call you  to review the results.   Testing/Procedures: None   Follow-Up: At [facility], you and your health needs are our priority.  As part of our continuing mission to provide you with  exceptional heart care, we have created designated Provider Care Teams.  These Care Teams include your primary Cardiologist (physician) and Advanced Practice Providers (APPs -  Physician Assistants and Nurse Practitioners) who all work together to provide you with the care you need, when you need it.  We recommend signing up for the patient portal called "MyChart".  Sign up information is provided on this After Visit Summary.  MyChart is used to connect with patients for Virtual Visits (Telemedicine).  Patients are able to view lab/test results, encounter notes, upcoming appointments, etc.  Non-urgent messages can be sent to your provider as well.   To learn more about what you can do with MyChart, go to [email or web address].    Your next appointment:   1 year(s)  The format for your next appointment:   In Person  Provider:   You may see Sinclair Grooms, MD or one of the following Advanced Practice Providers on your designated Care Team:    Kathyrn Drown, NP    Other Instructions      Signed, Sinclair Grooms, MD  12/02/2020 5:30 PM    [place]

## 2020-12-02 ENCOUNTER — Ambulatory Visit: Payer: Medicare PPO | Admitting: Interventional Cardiology

## 2020-12-02 ENCOUNTER — Encounter: Payer: Self-pay | Admitting: Interventional Cardiology

## 2020-12-02 ENCOUNTER — Other Ambulatory Visit: Payer: Self-pay

## 2020-12-02 VITALS — BP 120/70 | HR 74 | Ht 72.0 in | Wt 397.0 lb

## 2020-12-02 DIAGNOSIS — I2510 Atherosclerotic heart disease of native coronary artery without angina pectoris: Secondary | ICD-10-CM

## 2020-12-02 DIAGNOSIS — Z720 Tobacco use: Secondary | ICD-10-CM | POA: Diagnosis not present

## 2020-12-02 DIAGNOSIS — G250 Essential tremor: Secondary | ICD-10-CM

## 2020-12-02 DIAGNOSIS — Z7189 Other specified counseling: Secondary | ICD-10-CM

## 2020-12-02 DIAGNOSIS — I6523 Occlusion and stenosis of bilateral carotid arteries: Secondary | ICD-10-CM | POA: Diagnosis not present

## 2020-12-02 DIAGNOSIS — E782 Mixed hyperlipidemia: Secondary | ICD-10-CM

## 2020-12-02 DIAGNOSIS — I5032 Chronic diastolic (congestive) heart failure: Secondary | ICD-10-CM | POA: Diagnosis not present

## 2020-12-02 MED ORDER — CARVEDILOL 6.25 MG PO TABS: 6.2500 mg | ORAL_TABLET | Freq: Two times a day (BID) | ORAL | 3 refills | Status: DC

## 2020-12-02 NOTE — Patient Instructions (Signed)
Medication Instructions:  1) INCREASE Carvedilol to 6.25mg  twice daily  *If you need a refill on your cardiac medications before your next appointment, please call your pharmacy*   Lab Work: None If you have labs (blood work) drawn today and your tests are completely normal, you will receive your results only by: [name] MyChart Message (if you have MyChart) OR . A paper copy in the mail If you have any lab test that is abnormal or we need to change your treatment, we will call you to review the results.   Testing/Procedures: None   Follow-Up: At [facility], you and your health needs are our priority.  As part of our continuing mission to provide you with exceptional heart care, we have created designated Provider Care Teams.  These Care Teams include your primary Cardiologist (physician) and Advanced Practice Providers (APPs -  Physician Assistants and Nurse Practitioners) who all work together to provide you with the care you need, when you need it.  We recommend signing up for the patient portal called "MyChart".  Sign up information is provided on this After Visit Summary.  MyChart is used to connect with patients for Virtual Visits (Telemedicine).  Patients are able to view lab/test results, encounter notes, upcoming appointments, etc.  Non-urgent messages can be sent to your provider as well.   To learn more about what you can do with MyChart, go to [email or web address].    Your next appointment:   1 year(s)  The format for your next appointment:   In Person  Provider:   You may see Sinclair Grooms, MD or one of the following Advanced Practice Providers on your designated Care Team:    Kathyrn Drown, NP    Other Instructions

## 2021-01-03 DIAGNOSIS — G4733 Obstructive sleep apnea (adult) (pediatric): Secondary | ICD-10-CM | POA: Diagnosis not present

## 2021-01-05 DIAGNOSIS — G43719 Chronic migraine without aura, intractable, without status migrainosus: Secondary | ICD-10-CM | POA: Diagnosis not present

## 2021-02-01 DIAGNOSIS — I252 Old myocardial infarction: Secondary | ICD-10-CM | POA: Diagnosis not present

## 2021-02-01 DIAGNOSIS — K219 Gastro-esophageal reflux disease without esophagitis: Secondary | ICD-10-CM | POA: Diagnosis not present

## 2021-02-01 DIAGNOSIS — I2510 Atherosclerotic heart disease of native coronary artery without angina pectoris: Secondary | ICD-10-CM | POA: Diagnosis not present

## 2021-02-01 DIAGNOSIS — I5032 Chronic diastolic (congestive) heart failure: Secondary | ICD-10-CM | POA: Diagnosis not present

## 2021-02-01 DIAGNOSIS — F324 Major depressive disorder, single episode, in partial remission: Secondary | ICD-10-CM | POA: Diagnosis not present

## 2021-02-02 ENCOUNTER — Other Ambulatory Visit: Payer: Self-pay | Admitting: Interventional Cardiology

## 2021-02-10 DIAGNOSIS — G4733 Obstructive sleep apnea (adult) (pediatric): Secondary | ICD-10-CM | POA: Diagnosis not present

## 2021-03-01 ENCOUNTER — Other Ambulatory Visit: Payer: Self-pay | Admitting: Interventional Cardiology

## 2021-03-01 DIAGNOSIS — I2510 Atherosclerotic heart disease of native coronary artery without angina pectoris: Secondary | ICD-10-CM

## 2021-03-06 ENCOUNTER — Other Ambulatory Visit: Payer: Self-pay | Admitting: Interventional Cardiology

## 2021-03-15 DIAGNOSIS — E559 Vitamin D deficiency, unspecified: Secondary | ICD-10-CM | POA: Diagnosis not present

## 2021-03-15 DIAGNOSIS — Z79899 Other long term (current) drug therapy: Secondary | ICD-10-CM | POA: Diagnosis not present

## 2021-03-15 DIAGNOSIS — F324 Major depressive disorder, single episode, in partial remission: Secondary | ICD-10-CM | POA: Diagnosis not present

## 2021-03-15 DIAGNOSIS — Z125 Encounter for screening for malignant neoplasm of prostate: Secondary | ICD-10-CM | POA: Diagnosis not present

## 2021-03-15 DIAGNOSIS — R7303 Prediabetes: Secondary | ICD-10-CM | POA: Diagnosis not present

## 2021-03-15 DIAGNOSIS — G43909 Migraine, unspecified, not intractable, without status migrainosus: Secondary | ICD-10-CM | POA: Diagnosis not present

## 2021-03-15 DIAGNOSIS — I2510 Atherosclerotic heart disease of native coronary artery without angina pectoris: Secondary | ICD-10-CM | POA: Diagnosis not present

## 2021-03-15 DIAGNOSIS — Z0000 Encounter for general adult medical examination without abnormal findings: Secondary | ICD-10-CM | POA: Diagnosis not present

## 2021-04-19 DIAGNOSIS — G43719 Chronic migraine without aura, intractable, without status migrainosus: Secondary | ICD-10-CM | POA: Diagnosis not present

## 2021-05-11 DIAGNOSIS — G4733 Obstructive sleep apnea (adult) (pediatric): Secondary | ICD-10-CM | POA: Diagnosis not present

## 2021-05-20 ENCOUNTER — Other Ambulatory Visit: Payer: Self-pay | Admitting: Interventional Cardiology

## 2021-05-23 NOTE — Telephone Encounter (Signed)
Yes please

## 2021-06-07 DIAGNOSIS — G4730 Sleep apnea, unspecified: Secondary | ICD-10-CM | POA: Diagnosis not present

## 2021-06-07 DIAGNOSIS — Z9989 Dependence on other enabling machines and devices: Secondary | ICD-10-CM | POA: Diagnosis not present

## 2021-06-07 DIAGNOSIS — G43909 Migraine, unspecified, not intractable, without status migrainosus: Secondary | ICD-10-CM | POA: Diagnosis not present

## 2021-06-07 DIAGNOSIS — G40909 Epilepsy, unspecified, not intractable, without status epilepticus: Secondary | ICD-10-CM | POA: Diagnosis not present

## 2021-06-07 DIAGNOSIS — R569 Unspecified convulsions: Secondary | ICD-10-CM | POA: Diagnosis not present

## 2021-07-06 DIAGNOSIS — G43719 Chronic migraine without aura, intractable, without status migrainosus: Secondary | ICD-10-CM | POA: Diagnosis not present

## 2021-08-02 DIAGNOSIS — K219 Gastro-esophageal reflux disease without esophagitis: Secondary | ICD-10-CM | POA: Diagnosis not present

## 2021-08-02 DIAGNOSIS — I252 Old myocardial infarction: Secondary | ICD-10-CM | POA: Diagnosis not present

## 2021-08-02 DIAGNOSIS — I2510 Atherosclerotic heart disease of native coronary artery without angina pectoris: Secondary | ICD-10-CM | POA: Diagnosis not present

## 2021-08-02 DIAGNOSIS — F324 Major depressive disorder, single episode, in partial remission: Secondary | ICD-10-CM | POA: Diagnosis not present

## 2021-08-02 DIAGNOSIS — I5032 Chronic diastolic (congestive) heart failure: Secondary | ICD-10-CM | POA: Diagnosis not present

## 2021-08-10 DIAGNOSIS — Z9989 Dependence on other enabling machines and devices: Secondary | ICD-10-CM | POA: Diagnosis not present

## 2021-08-10 DIAGNOSIS — F17210 Nicotine dependence, cigarettes, uncomplicated: Secondary | ICD-10-CM | POA: Diagnosis not present

## 2021-08-10 DIAGNOSIS — G43709 Chronic migraine without aura, not intractable, without status migrainosus: Secondary | ICD-10-CM | POA: Diagnosis not present

## 2021-08-10 DIAGNOSIS — G4733 Obstructive sleep apnea (adult) (pediatric): Secondary | ICD-10-CM | POA: Diagnosis not present

## 2021-08-10 DIAGNOSIS — R251 Tremor, unspecified: Secondary | ICD-10-CM | POA: Diagnosis not present

## 2021-08-10 DIAGNOSIS — Z79899 Other long term (current) drug therapy: Secondary | ICD-10-CM | POA: Diagnosis not present

## 2021-09-13 DIAGNOSIS — R7303 Prediabetes: Secondary | ICD-10-CM | POA: Diagnosis not present

## 2021-09-13 DIAGNOSIS — Z79899 Other long term (current) drug therapy: Secondary | ICD-10-CM | POA: Diagnosis not present

## 2021-10-05 DIAGNOSIS — G43719 Chronic migraine without aura, intractable, without status migrainosus: Secondary | ICD-10-CM | POA: Diagnosis not present

## 2021-11-23 ENCOUNTER — Other Ambulatory Visit: Payer: Self-pay | Admitting: Interventional Cardiology

## 2021-12-07 DIAGNOSIS — G40119 Localization-related (focal) (partial) symptomatic epilepsy and epileptic syndromes with simple partial seizures, intractable, without status epilepticus: Secondary | ICD-10-CM | POA: Diagnosis not present

## 2021-12-07 DIAGNOSIS — R569 Unspecified convulsions: Secondary | ICD-10-CM | POA: Diagnosis not present

## 2021-12-07 DIAGNOSIS — Z79899 Other long term (current) drug therapy: Secondary | ICD-10-CM | POA: Diagnosis not present

## 2021-12-07 DIAGNOSIS — F419 Anxiety disorder, unspecified: Secondary | ICD-10-CM | POA: Diagnosis not present

## 2021-12-07 DIAGNOSIS — G4733 Obstructive sleep apnea (adult) (pediatric): Secondary | ICD-10-CM | POA: Diagnosis not present

## 2021-12-07 DIAGNOSIS — G43909 Migraine, unspecified, not intractable, without status migrainosus: Secondary | ICD-10-CM | POA: Diagnosis not present

## 2021-12-13 DIAGNOSIS — G43909 Migraine, unspecified, not intractable, without status migrainosus: Secondary | ICD-10-CM | POA: Diagnosis not present

## 2021-12-13 DIAGNOSIS — F419 Anxiety disorder, unspecified: Secondary | ICD-10-CM | POA: Diagnosis not present

## 2021-12-19 ENCOUNTER — Other Ambulatory Visit: Payer: Self-pay | Admitting: Interventional Cardiology

## 2021-12-28 ENCOUNTER — Other Ambulatory Visit: Payer: Self-pay | Admitting: Interventional Cardiology

## 2021-12-28 DIAGNOSIS — G43719 Chronic migraine without aura, intractable, without status migrainosus: Secondary | ICD-10-CM | POA: Diagnosis not present

## 2022-01-26 ENCOUNTER — Other Ambulatory Visit: Payer: Self-pay | Admitting: Interventional Cardiology

## 2022-01-28 ENCOUNTER — Other Ambulatory Visit: Payer: Self-pay | Admitting: Interventional Cardiology

## 2022-01-31 ENCOUNTER — Other Ambulatory Visit: Payer: Self-pay

## 2022-01-31 MED ORDER — CARVEDILOL 6.25 MG PO TABS: ORAL_TABLET | ORAL | 0 refills | Status: DC

## 2022-01-31 NOTE — Telephone Encounter (Signed)
Pt's medication was sent to pt's pharmacy requested. Confirmation received.  

## 2022-02-26 ENCOUNTER — Ambulatory Visit: Payer: Medicare PPO | Admitting: Physician Assistant

## 2022-02-28 NOTE — Progress Notes (Signed)
? ? ?Office Visit  ?  ?Patient Name: James Torres ?Date of Encounter: 03/01/2022 ? ?Primary Care Provider:  Lawerance Cruel, MD ?Primary Cardiologist:  Sinclair Grooms, MD ?Primary Electrophysiologist: None ? ?Patient Profile  ?  ?Chief Complaint: f/u CAD, CHF ? ? 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 ? Recent Studies: ?03/2015 2D echo: EF 50-55%, with distal ant, apical and inf-apical severe HK to dyskinesis and mild diastolic dysfunction.   ?06/2018 CT Angio of neck: Mild to moderate atherosclerotic calcifications of bilateral carotid bifurcations with focal stenosis of proximal left internal carotid artery of less than 50% ? ?History of Present Illness  ?  ?James Torres is a 56 y.o. male with PMH of tobacco abuse, obesity, HLD, ICM, GERD, OSA on CPAP, epidermoid brain tumor, CAD s/pPCI and stenting of the circumflex with a 38 mm long Synergy DES. He presented to ED 03/2015 substernal chest pain. Ischemic work-up revealed NSTEMI that was treated with Synergy DES. Echo was completed revealing EF 35-45%, repeat echo 24 hours later revealed EF of 50% with apical severe hypokinesis. Resection of epidermoid brain tumor via gamma knife in 10/2018. ? ?Since being seen last year for follow up James Torres reports that he is doing well and has no new complaints regarding his cardiac health.  He is compliant with his medications and reports no new side effects. His blood pressure today was 124/68 with heart rate 74. Patient denies chest pain, palpitations, dyspnea, PND, orthopnea, nausea, vomiting, dizziness, syncope, edema, weight gain, or early satiety.  He is currently not exercising due to  increased soreness in his knees however he is planning to get back into a walking routine once he feels better.  He does admit to eating increased amount of fast food states that he used to cook for himself but cannot stand for prolonged time with his knees.  ? ?Past Medical History  ?  ?Past Medical History:  ?Diagnosis Date  ? Anxiety   ? Brain tumor (James Torres)   ? CAD (coronary artery disease)   ? a. 03/2015 NSTEMI: LM nl, LAD 50ost, 100p (2.75x38 Synergy DES), 75d, RI nl, LCX minor irregs, OM1 min irregs, RCA 50ost, EF 35-45%.  ? Depression   ? GERD (gastroesophageal reflux disease)   ? Hyperlipidemia   ? Ischemic cardiomyopathy   ? a. 03/2015 EF 35-45% by LV gram @ time of NSTEMI;  b. 03/2015 Echo: EF 50-55%, sev HK to DK of dist an, apical, and infap walls.Gr 1 DD.  ? Kidney stone   ? Metabolic syndrome 05/20/9677  ? Migraines   ? Morbid obesity (James Torres)   ? Pre-diabetes   ? Seizures (James Torres)   ? a. Has auras and metalic taste in mouth.  Last one 06/2014- last a few seconds, has one every couple weeks, Dr Mare Loan is neurologist and is aware.   ? Sleep apnea 2009  ? a. On CPAP.  ? Tobacco abuse   ? ?Past Surgical History:  ?Procedure Laterality Date  ? BRAIN SURGERY  2004  ? Crainotomy for tumor  ? CARDIAC CATHETERIZATION N/A 03/21/2015  ? Procedure: Left Heart Cath and Coronary Angiography;  Surgeon: Legrand Como  Burt Knack, MD;  Location: [facility];  Service: Cardiovascular;  Laterality: N/A;  ? KNEE ARTHROSCOPY Right 2005 approx  ? cartliage  ? ULNAR NERVE TRANSPOSITION Left 06/25/2014  ? Procedure: LEFT ULNAR NEUROPLASTY AT ELBOW;  Surgeon: Jolyn Nap, MD;  Location: [facility];  Service: Orthopedics;  Laterality: Left;  ? WISDOM TOOTH EXTRACTION    ? ?Allergies ? ?Allergies  ?Allergen Reactions  ? Zithromax [Azithromycin] Hives  ? ? ?Home Medications  ?  ?Current Outpatient Medications  ?Medication Sig Dispense Refill  ? aspirin EC 81 MG EC tablet Take 1 tablet (81 mg total) by mouth daily.    ? CALCIUM  CARBONATE-VITAMIN D PO Take 1 tablet by mouth daily with breakfast.    ? carvedilol (COREG) 6.25 MG tablet Take 1 tablet by mouth in the morning and at bedtime. Please keep upcoming appt in April 2023 with Cardiologist before anymore refills. Thank you Final Attempt 180 tablet 0  ? citalopram (CELEXA) 40 MG tablet Take 40 mg by mouth every morning.    ? clonazePAM (KLONOPIN) 0.5 MG tablet Take 0.5 mg by mouth 2 (two) times daily.     ? famotidine (PEPCID) 20 MG tablet TAKE 1 TABLET BY MOUTH EVERY DAY 90 tablet 3  ? Fremanezumab-vfrm (AJOVY) 225 MG/1.5ML SOAJ Inject 1 Dose into the skin every 30 (thirty) days.    ? lamoTRIgine (LAMICTAL) 100 MG tablet Take 200 mg by mouth in the morning and at bedtime.    ? lisinopril (ZESTRIL) 2.5 MG tablet TAKE 1 TABLET BY MOUTH EVERY DAY 90 tablet 0  ? nitroGLYCERIN (NITROSTAT) 0.4 MG SL tablet Place 1 tablet (0.4 mg total) under the tongue every 5 (five) minutes as needed for chest pain (x 3 pills daily). 25 tablet 6  ? oxyCODONE-acetaminophen (ROXICET) 5-325 MG per tablet Take 1-2 tablets by mouth every 4 (four) hours as needed for severe pain. 40 tablet 0  ? pantoprazole (PROTONIX) 40 MG tablet TAKE 2 TABLETS BY MOUTH EVERY DAY 180 tablet 3  ? perampanel (FYCOMPA) 8 MG tablet Take 1 tablet by mouth daily.    ? rosuvastatin (CRESTOR) 40 MG tablet TAKE 1 TABLET BY MOUTH EVERY DAY 90 tablet 3  ? Ubrogepant (UBRELVY) 100 MG TABS Take 1-2 tablets by mouth as needed (HEADACHE).    ? levETIRAcetam (KEPPRA XR) 500 MG 24 hr tablet Take 2 tablets by mouth 2 (two) times daily.    ? ?No current facility-administered medications for this visit.  ?  ? ?Review of Systems  ?Please see the history of present illness.    ?(+) Bilateral knee pain ?(+) Essential tremors ? ?All other systems reviewed and are otherwise negative except as noted above. ? ?Physical Exam  ?  ?Wt Readings from Last 3 Encounters:  ?03/01/22 (!) 397 lb 6.4 oz (180.3 kg)  ?12/02/20 (!) 397 lb (180.1 kg)  ?02/09/20 (!) 385  lb (174.6 kg)  ? ?VS: ?Vitals:  ? 03/01/22 1537  ?BP: 124/68  ?Pulse: 74  ?SpO2: 95%  ?,Body mass index is 53.9 kg/m?. ? ?Constitutional:   ?   Appearance: Obese not in distress.  ?Neck:  ?   Vascular: JVD normal.  ?Pulmonary:  ?   Effort: Pulmonary effort is normal.  ?   Breath sounds: No wheezing. No rales. Diminished in the bases ?Cardiovascular:  ?   Normal rate. Regular rhythm. Normal S1. Normal S2.   ?   Murmurs: There is no murmur.  ?Edema: ?   Peripheral edema  absent.  ?Abdominal:  ?   Palpations: Abdomen is soft non tender. There is no hepatomegaly.  ?Skin: ?   General: Skin is warm and dry.  ?Neurological:  ?   General: No focal deficit present.  ?   Mental Status: Alert and oriented to person, place and time.  ?   Cranial Nerves: Cranial nerves are intact.  ?EKG/LABS/Other Studies Reviewed  ?  ?ECG personally reviewed by me today -normal sinus rhythm with low voltage with anterior infarct age undetermined  however previous EKG reassuring with similar findings ? ?Lab Results  ?Component Value Date  ? ALT 17 09/02/2017  ? AST 21 09/02/2017  ? ALKPHOS 85 09/02/2017  ? BILITOT 0.4 09/02/2017  ? ?Lab Results  ?Component Value Date  ? CHOL 129 09/02/2017  ? HDL 39 (L) 09/02/2017  ? [place] 66 09/02/2017  ? TRIG 122 09/02/2017  ? CHOLHDL 3.3 09/02/2017  ?  ?Lab Results  ?Component Value Date  ? HGBA1C 6.1 (H) 03/22/2015  ? ? ?Assessment & Plan  ?  ?1.  Coronary artery disease: ?-2016 s/p PCI and stenting of the circumflex with a 38 mm long Synergy DES  ?-Continue current GDMT with Coreg 6.25 mg daily, Crestor 40 mg daily, 81 mg ASA daily ?-Patient denies  anginal symptoms ? ? ?2.  Chronic diastolic heart failure with prior ICM: ?-2D echo completed 03/2015 with EF of 50-55%, severe hypokinesis to dyskinesis, diastolic dysfunction ?-Euvolemic ?-Low sodium diet, fluid restriction <2L, and daily weights encouraged. Educated to contact our office for weight gain of 2 lbs overnight or 5 lbs in one week.  ?-Continue  GDMT with Coreg 6.25 mg, lisinopril 2.5 mg. Not requiring diuretic therapy ? ?3.  Mixed hyperlipidemia: ?-Last LDL was 71 on 03/2021, patient planning to have labs drawn by PCP on May 10 ?-Continue rosuvastatin 40 mg daily ?

## 2022-03-01 ENCOUNTER — Encounter: Payer: Self-pay | Admitting: Physician Assistant

## 2022-03-01 ENCOUNTER — Ambulatory Visit: Payer: Medicare PPO | Admitting: Nurse Practitioner

## 2022-03-01 VITALS — BP 124/68 | HR 74 | Ht 72.0 in | Wt 397.4 lb

## 2022-03-01 DIAGNOSIS — E782 Mixed hyperlipidemia: Secondary | ICD-10-CM

## 2022-03-01 DIAGNOSIS — I5032 Chronic diastolic (congestive) heart failure: Secondary | ICD-10-CM

## 2022-03-01 DIAGNOSIS — I2510 Atherosclerotic heart disease of native coronary artery without angina pectoris: Secondary | ICD-10-CM

## 2022-03-01 DIAGNOSIS — Z720 Tobacco use: Secondary | ICD-10-CM

## 2022-03-01 MED ORDER — LISINOPRIL 2.5 MG PO TABS: 2.5000 mg | ORAL_TABLET | Freq: Every day | ORAL | 2 refills | Status: DC

## 2022-03-01 MED ORDER — NITROGLYCERIN 0.4 MG SL SUBL: 0.4000 mg | SUBLINGUAL_TABLET | SUBLINGUAL | 6 refills | Status: DC | PRN

## 2022-03-01 MED ORDER — PANTOPRAZOLE SODIUM 40 MG PO TBEC: 80.0000 mg | DELAYED_RELEASE_TABLET | Freq: Every day | ORAL | 3 refills | Status: DC

## 2022-03-01 MED ORDER — CARVEDILOL 6.25 MG PO TABS: ORAL_TABLET | ORAL | 3 refills | Status: DC

## 2022-03-01 MED ORDER — ROSUVASTATIN CALCIUM 40 MG PO TABS: 40.0000 mg | ORAL_TABLET | Freq: Every day | ORAL | 3 refills | Status: DC

## 2022-03-01 NOTE — Patient Instructions (Addendum)
Medication Instructions:  ?Your physician recommends that you continue on your current medications as directed. Please refer to the Current Medication list given to you today. ?*If you need a refill on your cardiac medications before your next appointment, please call your pharmacy* ? ? ?Lab Work: ?None Ordered ? ? ?Testing/Procedures: ?Your physician has requested that you have a carotid duplex. This test is an ultrasound of the carotid arteries in your neck. It looks at blood flow through these arteries that supply the brain with blood. Allow one hour for this exam. There are no restrictions or special instructions. ?[address] [employer] [facility])  ? ?Follow-Up: ?At [facility], you and your health needs are our priority.  As part of our continuing mission to provide you with exceptional heart care, we have created designated Provider Care Teams.  These Care Teams include your primary Cardiologist (physician) and Advanced Practice Providers (APPs -  Physician Assistants and Nurse Practitioners) who all work together to provide you with the care you need, when you need it. ? ?We recommend signing up for the patient portal called "MyChart".  Sign up information is provided on this After Visit Summary.  MyChart is used to connect with patients for Virtual Visits (Telemedicine).  Patients are able to view lab/test results, encounter notes, upcoming appointments, etc.  Non-urgent messages can be sent to your provider as well.   ?To learn more about what you can do with MyChart, go to [email or web address].   ? ?Your next appointment:   ?12 month(s) ? ?The format for your next appointment:   ?In Person ? ?Provider:   ?Sinclair Grooms, MD or APP  ? ? ?Other Instructions ? ? ?Important Information About Sugar ? ? ? ? ?  ?

## 2022-03-14 DIAGNOSIS — I2510 Atherosclerotic heart disease of native coronary artery without angina pectoris: Secondary | ICD-10-CM | POA: Diagnosis not present

## 2022-03-14 DIAGNOSIS — Z125 Encounter for screening for malignant neoplasm of prostate: Secondary | ICD-10-CM | POA: Diagnosis not present

## 2022-03-14 DIAGNOSIS — Z79899 Other long term (current) drug therapy: Secondary | ICD-10-CM | POA: Diagnosis not present

## 2022-03-14 DIAGNOSIS — E559 Vitamin D deficiency, unspecified: Secondary | ICD-10-CM | POA: Diagnosis not present

## 2022-03-28 DIAGNOSIS — D496 Neoplasm of unspecified behavior of brain: Secondary | ICD-10-CM | POA: Diagnosis not present

## 2022-03-28 DIAGNOSIS — F411 Generalized anxiety disorder: Secondary | ICD-10-CM | POA: Diagnosis not present

## 2022-03-28 DIAGNOSIS — Z0000 Encounter for general adult medical examination without abnormal findings: Secondary | ICD-10-CM | POA: Diagnosis not present

## 2022-03-28 DIAGNOSIS — F324 Major depressive disorder, single episode, in partial remission: Secondary | ICD-10-CM | POA: Diagnosis not present

## 2022-03-28 DIAGNOSIS — G43909 Migraine, unspecified, not intractable, without status migrainosus: Secondary | ICD-10-CM | POA: Diagnosis not present

## 2022-03-28 DIAGNOSIS — E559 Vitamin D deficiency, unspecified: Secondary | ICD-10-CM | POA: Diagnosis not present

## 2022-03-28 DIAGNOSIS — R7303 Prediabetes: Secondary | ICD-10-CM | POA: Diagnosis not present

## 2022-03-28 DIAGNOSIS — G40909 Epilepsy, unspecified, not intractable, without status epilepticus: Secondary | ICD-10-CM | POA: Diagnosis not present

## 2022-03-29 DIAGNOSIS — Z9889 Other specified postprocedural states: Secondary | ICD-10-CM | POA: Diagnosis not present

## 2022-03-29 DIAGNOSIS — Z0389 Encounter for observation for other suspected diseases and conditions ruled out: Secondary | ICD-10-CM | POA: Diagnosis not present

## 2022-03-29 DIAGNOSIS — Z79899 Other long term (current) drug therapy: Secondary | ICD-10-CM | POA: Diagnosis not present

## 2022-03-29 DIAGNOSIS — G939 Disorder of brain, unspecified: Secondary | ICD-10-CM | POA: Diagnosis not present

## 2022-03-29 DIAGNOSIS — G43719 Chronic migraine without aura, intractable, without status migrainosus: Secondary | ICD-10-CM | POA: Diagnosis not present

## 2022-03-29 DIAGNOSIS — Z923 Personal history of irradiation: Secondary | ICD-10-CM | POA: Diagnosis not present

## 2022-03-29 DIAGNOSIS — R569 Unspecified convulsions: Secondary | ICD-10-CM | POA: Diagnosis not present

## 2022-03-29 DIAGNOSIS — G930 Cerebral cysts: Secondary | ICD-10-CM | POA: Diagnosis not present

## 2022-04-04 ENCOUNTER — Other Ambulatory Visit: Payer: Self-pay | Admitting: Nurse Practitioner

## 2022-04-04 DIAGNOSIS — I5032 Chronic diastolic (congestive) heart failure: Secondary | ICD-10-CM

## 2022-04-04 DIAGNOSIS — Z720 Tobacco use: Secondary | ICD-10-CM

## 2022-04-04 DIAGNOSIS — E782 Mixed hyperlipidemia: Secondary | ICD-10-CM

## 2022-04-04 DIAGNOSIS — I6523 Occlusion and stenosis of bilateral carotid arteries: Secondary | ICD-10-CM

## 2022-04-05 DIAGNOSIS — M1712 Unilateral primary osteoarthritis, left knee: Secondary | ICD-10-CM | POA: Diagnosis not present

## 2022-04-05 DIAGNOSIS — M25562 Pain in left knee: Secondary | ICD-10-CM | POA: Diagnosis not present

## 2022-04-10 ENCOUNTER — Ambulatory Visit (HOSPITAL_COMMUNITY)
Admission: RE | Admit: 2022-04-10 | Discharge: 2022-04-10 | Disposition: A | Discharge status: Home / Self Care | Payer: Medicare PPO | Source: Ambulatory Visit | Attending: Nurse Practitioner | Admitting: Nurse Practitioner

## 2022-04-10 DIAGNOSIS — I5032 Chronic diastolic (congestive) heart failure: Secondary | ICD-10-CM | POA: Diagnosis not present

## 2022-04-10 DIAGNOSIS — I6523 Occlusion and stenosis of bilateral carotid arteries: Secondary | ICD-10-CM | POA: Diagnosis not present

## 2022-04-10 DIAGNOSIS — Z720 Tobacco use: Secondary | ICD-10-CM | POA: Diagnosis not present

## 2022-04-10 DIAGNOSIS — E782 Mixed hyperlipidemia: Secondary | ICD-10-CM | POA: Diagnosis not present

## 2022-05-10 DIAGNOSIS — M25562 Pain in left knee: Secondary | ICD-10-CM | POA: Diagnosis not present

## 2022-05-11 ENCOUNTER — Other Ambulatory Visit: Payer: Self-pay | Admitting: Orthopedic Surgery

## 2022-05-11 DIAGNOSIS — M25561 Pain in right knee: Secondary | ICD-10-CM

## 2022-05-15 ENCOUNTER — Other Ambulatory Visit: Payer: Self-pay | Admitting: Interventional Cardiology

## 2022-05-24 ENCOUNTER — Other Ambulatory Visit (HOSPITAL_COMMUNITY): Payer: Self-pay | Admitting: Nurse Practitioner

## 2022-05-24 DIAGNOSIS — I6522 Occlusion and stenosis of left carotid artery: Secondary | ICD-10-CM

## 2022-05-29 ENCOUNTER — Other Ambulatory Visit: Payer: Self-pay | Admitting: Orthopedic Surgery

## 2022-05-29 ENCOUNTER — Ambulatory Visit
Admission: RE | Admit: 2022-05-29 | Discharge: 2022-05-29 | Disposition: A | Discharge status: Home / Self Care | Payer: Medicare PPO | Source: Ambulatory Visit | Attending: Orthopedic Surgery | Admitting: Orthopedic Surgery

## 2022-05-29 DIAGNOSIS — M25561 Pain in right knee: Secondary | ICD-10-CM

## 2022-05-29 DIAGNOSIS — S83412A Sprain of medial collateral ligament of left knee, initial encounter: Secondary | ICD-10-CM | POA: Diagnosis not present

## 2022-06-19 DIAGNOSIS — M1612 Unilateral primary osteoarthritis, left hip: Secondary | ICD-10-CM | POA: Diagnosis not present

## 2022-06-19 DIAGNOSIS — M2242 Chondromalacia patellae, left knee: Secondary | ICD-10-CM | POA: Diagnosis not present

## 2022-06-19 DIAGNOSIS — M25562 Pain in left knee: Secondary | ICD-10-CM | POA: Diagnosis not present

## 2022-06-20 ENCOUNTER — Other Ambulatory Visit: Payer: Self-pay | Admitting: Orthopedic Surgery

## 2022-06-20 DIAGNOSIS — M25552 Pain in left hip: Secondary | ICD-10-CM

## 2022-06-26 DIAGNOSIS — C44311 Basal cell carcinoma of skin of nose: Secondary | ICD-10-CM | POA: Diagnosis not present

## 2022-06-26 DIAGNOSIS — D485 Neoplasm of uncertain behavior of skin: Secondary | ICD-10-CM | POA: Diagnosis not present

## 2022-06-28 DIAGNOSIS — G43719 Chronic migraine without aura, intractable, without status migrainosus: Secondary | ICD-10-CM | POA: Diagnosis not present

## 2022-07-06 ENCOUNTER — Ambulatory Visit
Admission: RE | Admit: 2022-07-06 | Discharge: 2022-07-06 | Disposition: A | Discharge status: Home / Self Care | Payer: Medicare PPO | Source: Ambulatory Visit | Attending: Orthopedic Surgery | Admitting: Orthopedic Surgery

## 2022-07-06 DIAGNOSIS — M25552 Pain in left hip: Secondary | ICD-10-CM

## 2022-07-06 DIAGNOSIS — M1612 Unilateral primary osteoarthritis, left hip: Secondary | ICD-10-CM | POA: Diagnosis not present

## 2022-07-06 DIAGNOSIS — M87852 Other osteonecrosis, left femur: Secondary | ICD-10-CM | POA: Diagnosis not present

## 2022-07-06 DIAGNOSIS — M25452 Effusion, left hip: Secondary | ICD-10-CM | POA: Diagnosis not present

## 2022-07-12 DIAGNOSIS — Z6841 Body Mass Index (BMI) 40.0 and over, adult: Secondary | ICD-10-CM | POA: Diagnosis not present

## 2022-07-12 DIAGNOSIS — M1612 Unilateral primary osteoarthritis, left hip: Secondary | ICD-10-CM | POA: Diagnosis not present

## 2022-07-12 DIAGNOSIS — M25552 Pain in left hip: Secondary | ICD-10-CM | POA: Diagnosis not present

## 2022-07-16 DIAGNOSIS — M25552 Pain in left hip: Secondary | ICD-10-CM | POA: Diagnosis not present

## 2022-07-16 DIAGNOSIS — M1612 Unilateral primary osteoarthritis, left hip: Secondary | ICD-10-CM | POA: Diagnosis not present

## 2022-07-25 DIAGNOSIS — C44311 Basal cell carcinoma of skin of nose: Secondary | ICD-10-CM | POA: Diagnosis not present

## 2022-08-14 DIAGNOSIS — M1612 Unilateral primary osteoarthritis, left hip: Secondary | ICD-10-CM | POA: Diagnosis not present

## 2022-08-28 DIAGNOSIS — M25552 Pain in left hip: Secondary | ICD-10-CM | POA: Diagnosis not present

## 2022-09-17 DIAGNOSIS — U071 COVID-19: Secondary | ICD-10-CM | POA: Diagnosis not present

## 2022-09-17 DIAGNOSIS — R509 Fever, unspecified: Secondary | ICD-10-CM | POA: Diagnosis not present

## 2022-09-17 DIAGNOSIS — R059 Cough, unspecified: Secondary | ICD-10-CM | POA: Diagnosis not present

## 2022-09-17 DIAGNOSIS — R52 Pain, unspecified: Secondary | ICD-10-CM | POA: Diagnosis not present

## 2022-09-17 DIAGNOSIS — R197 Diarrhea, unspecified: Secondary | ICD-10-CM | POA: Diagnosis not present

## 2022-09-20 DIAGNOSIS — F411 Generalized anxiety disorder: Secondary | ICD-10-CM | POA: Diagnosis not present

## 2022-09-20 DIAGNOSIS — G43909 Migraine, unspecified, not intractable, without status migrainosus: Secondary | ICD-10-CM | POA: Diagnosis not present

## 2022-11-05 DIAGNOSIS — I779 Disorder of arteries and arterioles, unspecified: Secondary | ICD-10-CM

## 2022-11-05 DIAGNOSIS — Z951 Presence of aortocoronary bypass graft: Secondary | ICD-10-CM

## 2022-11-05 HISTORY — DX: Disorder of arteries and arterioles, unspecified: I77.9

## 2022-11-05 HISTORY — DX: Presence of aortocoronary bypass graft: Z95.1

## 2022-11-10 ENCOUNTER — Inpatient Hospital Stay (HOSPITAL_COMMUNITY): Payer: Medicare PPO

## 2022-11-10 ENCOUNTER — Encounter (HOSPITAL_COMMUNITY)
Admission: EM | Disposition: A | Discharge status: Rehabilitation Facility | Payer: Self-pay | Source: Home / Self Care | Attending: Thoracic Surgery (Cardiothoracic Vascular Surgery)

## 2022-11-10 ENCOUNTER — Inpatient Hospital Stay (HOSPITAL_COMMUNITY)
Admission: EM | Admit: 2022-11-10 | Discharge: 2022-11-23 | DRG: 233 | Disposition: A | Discharge status: Rehabilitation Facility | Payer: Medicare PPO | Attending: Thoracic Surgery (Cardiothoracic Vascular Surgery) | Admitting: Thoracic Surgery (Cardiothoracic Vascular Surgery)

## 2022-11-10 ENCOUNTER — Encounter (HOSPITAL_COMMUNITY): Payer: Self-pay

## 2022-11-10 ENCOUNTER — Other Ambulatory Visit: Payer: Self-pay

## 2022-11-10 ENCOUNTER — Emergency Department (HOSPITAL_COMMUNITY): Payer: Medicare PPO

## 2022-11-10 DIAGNOSIS — M1612 Unilateral primary osteoarthritis, left hip: Secondary | ICD-10-CM | POA: Diagnosis present

## 2022-11-10 DIAGNOSIS — I2584 Coronary atherosclerosis due to calcified coronary lesion: Secondary | ICD-10-CM | POA: Diagnosis present

## 2022-11-10 DIAGNOSIS — I10 Essential (primary) hypertension: Secondary | ICD-10-CM | POA: Diagnosis not present

## 2022-11-10 DIAGNOSIS — Z7982 Long term (current) use of aspirin: Secondary | ICD-10-CM

## 2022-11-10 DIAGNOSIS — J189 Pneumonia, unspecified organism: Secondary | ICD-10-CM | POA: Diagnosis not present

## 2022-11-10 DIAGNOSIS — J9601 Acute respiratory failure with hypoxia: Secondary | ICD-10-CM | POA: Diagnosis not present

## 2022-11-10 DIAGNOSIS — R600 Localized edema: Secondary | ICD-10-CM | POA: Diagnosis not present

## 2022-11-10 DIAGNOSIS — I2583 Coronary atherosclerosis due to lipid rich plaque: Secondary | ICD-10-CM | POA: Diagnosis not present

## 2022-11-10 DIAGNOSIS — G43709 Chronic migraine without aura, not intractable, without status migrainosus: Secondary | ICD-10-CM | POA: Diagnosis not present

## 2022-11-10 DIAGNOSIS — G40909 Epilepsy, unspecified, not intractable, without status epilepticus: Secondary | ICD-10-CM | POA: Diagnosis not present

## 2022-11-10 DIAGNOSIS — K219 Gastro-esophageal reflux disease without esophagitis: Secondary | ICD-10-CM | POA: Diagnosis present

## 2022-11-10 DIAGNOSIS — K5901 Slow transit constipation: Secondary | ICD-10-CM | POA: Diagnosis not present

## 2022-11-10 DIAGNOSIS — F17210 Nicotine dependence, cigarettes, uncomplicated: Secondary | ICD-10-CM | POA: Diagnosis present

## 2022-11-10 DIAGNOSIS — I110 Hypertensive heart disease with heart failure: Secondary | ICD-10-CM | POA: Diagnosis present

## 2022-11-10 DIAGNOSIS — I088 Other rheumatic multiple valve diseases: Secondary | ICD-10-CM | POA: Diagnosis not present

## 2022-11-10 DIAGNOSIS — I25110 Atherosclerotic heart disease of native coronary artery with unstable angina pectoris: Secondary | ICD-10-CM | POA: Diagnosis not present

## 2022-11-10 DIAGNOSIS — R110 Nausea: Secondary | ICD-10-CM | POA: Diagnosis not present

## 2022-11-10 DIAGNOSIS — R7303 Prediabetes: Secondary | ICD-10-CM | POA: Diagnosis present

## 2022-11-10 DIAGNOSIS — G4733 Obstructive sleep apnea (adult) (pediatric): Secondary | ICD-10-CM

## 2022-11-10 DIAGNOSIS — Z86011 Personal history of benign neoplasm of the brain: Secondary | ICD-10-CM

## 2022-11-10 DIAGNOSIS — I252 Old myocardial infarction: Secondary | ICD-10-CM | POA: Diagnosis not present

## 2022-11-10 DIAGNOSIS — Z6841 Body Mass Index (BMI) 40.0 and over, adult: Secondary | ICD-10-CM

## 2022-11-10 DIAGNOSIS — K5900 Constipation, unspecified: Secondary | ICD-10-CM | POA: Diagnosis not present

## 2022-11-10 DIAGNOSIS — E871 Hypo-osmolality and hyponatremia: Secondary | ICD-10-CM | POA: Diagnosis not present

## 2022-11-10 DIAGNOSIS — R079 Chest pain, unspecified: Secondary | ICD-10-CM

## 2022-11-10 DIAGNOSIS — D696 Thrombocytopenia, unspecified: Secondary | ICD-10-CM

## 2022-11-10 DIAGNOSIS — R739 Hyperglycemia, unspecified: Secondary | ICD-10-CM | POA: Diagnosis not present

## 2022-11-10 DIAGNOSIS — Y92239 Unspecified place in hospital as the place of occurrence of the external cause: Secondary | ICD-10-CM | POA: Diagnosis not present

## 2022-11-10 DIAGNOSIS — Z79899 Other long term (current) drug therapy: Secondary | ICD-10-CM

## 2022-11-10 DIAGNOSIS — Z1152 Encounter for screening for COVID-19: Secondary | ICD-10-CM | POA: Diagnosis not present

## 2022-11-10 DIAGNOSIS — F419 Anxiety disorder, unspecified: Secondary | ICD-10-CM | POA: Diagnosis present

## 2022-11-10 DIAGNOSIS — R5381 Other malaise: Secondary | ICD-10-CM | POA: Diagnosis not present

## 2022-11-10 DIAGNOSIS — R7989 Other specified abnormal findings of blood chemistry: Secondary | ICD-10-CM | POA: Diagnosis not present

## 2022-11-10 DIAGNOSIS — I214 Non-ST elevation (NSTEMI) myocardial infarction: Secondary | ICD-10-CM | POA: Diagnosis present

## 2022-11-10 DIAGNOSIS — I2510 Atherosclerotic heart disease of native coronary artery without angina pectoris: Secondary | ICD-10-CM | POA: Diagnosis not present

## 2022-11-10 DIAGNOSIS — E7800 Pure hypercholesterolemia, unspecified: Secondary | ICD-10-CM | POA: Diagnosis present

## 2022-11-10 DIAGNOSIS — Z8249 Family history of ischemic heart disease and other diseases of the circulatory system: Secondary | ICD-10-CM

## 2022-11-10 DIAGNOSIS — J9811 Atelectasis: Secondary | ICD-10-CM | POA: Diagnosis not present

## 2022-11-10 DIAGNOSIS — T502X5A Adverse effect of carbonic-anhydrase inhibitors, benzothiadiazides and other diuretics, initial encounter: Secondary | ICD-10-CM | POA: Diagnosis not present

## 2022-11-10 DIAGNOSIS — F32A Depression, unspecified: Secondary | ICD-10-CM | POA: Diagnosis present

## 2022-11-10 DIAGNOSIS — K5903 Drug induced constipation: Secondary | ICD-10-CM | POA: Diagnosis not present

## 2022-11-10 DIAGNOSIS — Z955 Presence of coronary angioplasty implant and graft: Secondary | ICD-10-CM | POA: Diagnosis not present

## 2022-11-10 DIAGNOSIS — Z823 Family history of stroke: Secondary | ICD-10-CM

## 2022-11-10 DIAGNOSIS — I213 ST elevation (STEMI) myocardial infarction of unspecified site: Principal | ICD-10-CM | POA: Diagnosis present

## 2022-11-10 DIAGNOSIS — Z79890 Hormone replacement therapy (postmenopausal): Secondary | ICD-10-CM

## 2022-11-10 DIAGNOSIS — Z951 Presence of aortocoronary bypass graft: Secondary | ICD-10-CM

## 2022-11-10 DIAGNOSIS — Z87442 Personal history of urinary calculi: Secondary | ICD-10-CM

## 2022-11-10 DIAGNOSIS — G43909 Migraine, unspecified, not intractable, without status migrainosus: Secondary | ICD-10-CM | POA: Diagnosis present

## 2022-11-10 DIAGNOSIS — E878 Other disorders of electrolyte and fluid balance, not elsewhere classified: Secondary | ICD-10-CM | POA: Diagnosis not present

## 2022-11-10 DIAGNOSIS — I255 Ischemic cardiomyopathy: Secondary | ICD-10-CM | POA: Diagnosis present

## 2022-11-10 DIAGNOSIS — J90 Pleural effusion, not elsewhere classified: Secondary | ICD-10-CM | POA: Diagnosis not present

## 2022-11-10 DIAGNOSIS — Z01810 Encounter for preprocedural cardiovascular examination: Secondary | ICD-10-CM | POA: Diagnosis not present

## 2022-11-10 DIAGNOSIS — F17200 Nicotine dependence, unspecified, uncomplicated: Secondary | ICD-10-CM | POA: Diagnosis not present

## 2022-11-10 DIAGNOSIS — Z23 Encounter for immunization: Secondary | ICD-10-CM | POA: Diagnosis not present

## 2022-11-10 DIAGNOSIS — G40A09 Absence epileptic syndrome, not intractable, without status epilepticus: Secondary | ICD-10-CM | POA: Diagnosis present

## 2022-11-10 DIAGNOSIS — I5023 Acute on chronic systolic (congestive) heart failure: Secondary | ICD-10-CM | POA: Diagnosis not present

## 2022-11-10 DIAGNOSIS — J9600 Acute respiratory failure, unspecified whether with hypoxia or hypercapnia: Secondary | ICD-10-CM

## 2022-11-10 DIAGNOSIS — R0789 Other chest pain: Secondary | ICD-10-CM | POA: Diagnosis not present

## 2022-11-10 DIAGNOSIS — I3139 Other pericardial effusion (noninflammatory): Secondary | ICD-10-CM | POA: Diagnosis not present

## 2022-11-10 DIAGNOSIS — R001 Bradycardia, unspecified: Secondary | ICD-10-CM | POA: Diagnosis not present

## 2022-11-10 DIAGNOSIS — Z881 Allergy status to other antibiotic agents status: Secondary | ICD-10-CM

## 2022-11-10 DIAGNOSIS — G4700 Insomnia, unspecified: Secondary | ICD-10-CM | POA: Diagnosis not present

## 2022-11-10 DIAGNOSIS — F418 Other specified anxiety disorders: Secondary | ICD-10-CM | POA: Diagnosis not present

## 2022-11-10 DIAGNOSIS — J939 Pneumothorax, unspecified: Secondary | ICD-10-CM | POA: Diagnosis not present

## 2022-11-10 DIAGNOSIS — R000 Tachycardia, unspecified: Secondary | ICD-10-CM | POA: Diagnosis not present

## 2022-11-10 LAB — CBC WITH DIFFERENTIAL/PLATELET
Abs Immature Granulocytes: 0.03 10*3/uL (ref 0.00–0.07)
Basophils Absolute: 0.1 10*3/uL (ref 0.0–0.1)
Basophils Relative: 1 %
Eosinophils Absolute: 0.2 10*3/uL (ref 0.0–0.5)
Eosinophils Relative: 3 %
HCT: 46.1 % (ref 39.0–52.0)
Hemoglobin: 15 g/dL (ref 13.0–17.0)
Immature Granulocytes: 1 %
Lymphocytes Relative: 30 %
Lymphs Abs: 2 10*3/uL (ref 0.7–4.0)
MCH: 31.2 pg (ref 26.0–34.0)
MCHC: 32.5 g/dL (ref 30.0–36.0)
MCV: 95.8 fL (ref 80.0–100.0)
Monocytes Absolute: 0.4 10*3/uL (ref 0.1–1.0)
Monocytes Relative: 6 %
Neutro Abs: 3.9 10*3/uL (ref 1.7–7.7)
Neutrophils Relative %: 59 %
Platelets: 155 10*3/uL (ref 150–400)
RBC: 4.81 MIL/uL (ref 4.22–5.81)
RDW: 14.6 % (ref 11.5–15.5)
WBC: 6.6 10*3/uL (ref 4.0–10.5)
nRBC: 0 % (ref 0.0–0.2)

## 2022-11-10 LAB — APTT: aPTT: 29 seconds (ref 24–36)

## 2022-11-10 LAB — COMPREHENSIVE METABOLIC PANEL
ALT: 26 U/L (ref 0–44)
AST: 33 U/L (ref 15–41)
Albumin: 3.7 g/dL (ref 3.5–5.0)
Alkaline Phosphatase: 74 U/L (ref 38–126)
Anion gap: 11 (ref 5–15)
BUN: 9 mg/dL (ref 6–20)
CO2: 22 mmol/L (ref 22–32)
Calcium: 9 mg/dL (ref 8.9–10.3)
Chloride: 106 mmol/L (ref 98–111)
Creatinine, Ser: 1 mg/dL (ref 0.61–1.24)
GFR, Estimated: >60 mL/min (ref >60)
Glucose, Bld: 173 mg/dL — ABNORMAL HIGH (ref 70–99)
Potassium: 4.3 mmol/L (ref 3.5–5.1)
Sodium: 139 mmol/L (ref 135–145)
Total Bilirubin: 0.5 mg/dL (ref 0.3–1.2)
Total Protein: 6.6 g/dL (ref 6.5–8.1)

## 2022-11-10 LAB — LIPID PANEL
Cholesterol: 136 mg/dL (ref 0–200)
HDL: 41 mg/dL (ref >40)
LDL Cholesterol: 71 mg/dL (ref 0–99)
Total CHOL/HDL Ratio: 3.3 RATIO
Triglycerides: 120 mg/dL (ref <150)
VLDL: 24 mg/dL (ref 0–40)

## 2022-11-10 LAB — I-STAT CHEM 8, ED
BUN: 10 mg/dL (ref 6–20)
Calcium, Ion: 1.1 mmol/L — ABNORMAL LOW (ref 1.15–1.40)
Chloride: 106 mmol/L (ref 98–111)
Creatinine, Ser: 0.9 mg/dL (ref 0.61–1.24)
Glucose, Bld: 169 mg/dL — ABNORMAL HIGH (ref 70–99)
HCT: 45 % (ref 39.0–52.0)
Hemoglobin: 15.3 g/dL (ref 13.0–17.0)
Potassium: 4.3 mmol/L (ref 3.5–5.1)
Sodium: 141 mmol/L (ref 135–145)
TCO2: 24 mmol/L (ref 22–32)

## 2022-11-10 LAB — ECHOCARDIOGRAM COMPLETE
AR max vel: 2.5 cm2
AV Area VTI: 3.06 cm2
AV Area mean vel: 2.42 cm2
AV Mean grad: 5 mmHg
AV Peak grad: 9.2 mmHg
Ao pk vel: 1.52 m/s
Area-P 1/2: 3.28 cm2
Height: 72 in
MV VTI: 3.08 cm2
S' Lateral: 4.2 cm
Weight: 6320 [oz_av]

## 2022-11-10 LAB — MAGNESIUM: Magnesium: 2 mg/dL (ref 1.7–2.4)

## 2022-11-10 LAB — TROPONIN I (HIGH SENSITIVITY)
Troponin I (High Sensitivity): 177 ng/L (ref <18)
Troponin I (High Sensitivity): 969 ng/L (ref <18)
Troponin I (High Sensitivity): 4401 ng/L

## 2022-11-10 LAB — HEPARIN LEVEL (UNFRACTIONATED): Heparin Unfractionated: 0.22 [IU]/mL — ABNORMAL LOW (ref 0.30–0.70)

## 2022-11-10 LAB — TSH: TSH: 2.001 u[IU]/mL (ref 0.350–4.500)

## 2022-11-10 LAB — PROTIME-INR
INR: 0.9 (ref 0.8–1.2)
Prothrombin Time: 12.3 seconds (ref 11.4–15.2)

## 2022-11-10 LAB — T4, FREE: Free T4: 0.95 ng/dL (ref 0.61–1.12)

## 2022-11-10 LAB — HIV ANTIBODY (ROUTINE TESTING W REFLEX): HIV Screen 4th Generation wRfx: NONREACTIVE

## 2022-11-10 SURGERY — CORONARY/GRAFT ACUTE MI REVASCULARIZATION
Anesthesia: LOCAL

## 2022-11-10 MED ORDER — SODIUM CHLORIDE 0.9 % IV SOLN: INTRAVENOUS | Status: DC

## 2022-11-10 MED ORDER — PERFLUTREN LIPID MICROSPHERE
1.0000 mL | INTRAVENOUS | Status: AC | PRN
  Administered 2022-11-10: 5 mL via INTRAVENOUS

## 2022-11-10 MED ORDER — NITROGLYCERIN 2 % TD OINT
1.0000 [in_us] | TOPICAL_OINTMENT | Freq: Four times a day (QID) | TRANSDERMAL | Status: DC
  Administered 2022-11-10 – 2022-11-11 (×4): 1 [in_us] via TOPICAL
  Filled 2022-11-10 (×2): qty 1
  Filled 2022-11-10: qty 30

## 2022-11-10 MED ORDER — NITROGLYCERIN 0.4 MG SL SUBL: 0.4000 mg | SUBLINGUAL_TABLET | SUBLINGUAL | Status: DC | PRN

## 2022-11-10 MED ORDER — MORPHINE SULFATE (PF) 4 MG/ML IV SOLN
4.0000 mg | Freq: Once | INTRAVENOUS | Status: AC
  Administered 2022-11-10: 4 mg via INTRAVENOUS
  Filled 2022-11-10: qty 1

## 2022-11-10 MED ORDER — NITROGLYCERIN 2 % TD OINT: 1.0000 [in_us] | TOPICAL_OINTMENT | Freq: Four times a day (QID) | TRANSDERMAL | Status: DC

## 2022-11-10 MED ORDER — HEPARIN SODIUM (PORCINE) 5000 UNIT/ML IJ SOLN
4000.0000 [IU] | Freq: Once | INTRAMUSCULAR | Status: AC
  Administered 2022-11-10: 4000 [IU] via INTRAVENOUS
  Filled 2022-11-10: qty 1

## 2022-11-10 MED ORDER — LAMOTRIGINE 100 MG PO TABS
200.0000 mg | ORAL_TABLET | Freq: Every day | ORAL | Status: DC
  Administered 2022-11-11 – 2022-11-19 (×8): 200 mg via ORAL
  Filled 2022-11-10 (×9): qty 2

## 2022-11-10 MED ORDER — CARVEDILOL 6.25 MG PO TABS
6.2500 mg | ORAL_TABLET | Freq: Two times a day (BID) | ORAL | Status: DC
  Administered 2022-11-10 – 2022-11-13 (×6): 6.25 mg via ORAL
  Filled 2022-11-10 (×2): qty 1
  Filled 2022-11-10: qty 2
  Filled 2022-11-10 (×3): qty 1

## 2022-11-10 MED ORDER — HEPARIN (PORCINE) 25000 UT/250ML-% IV SOLN
1950.0000 [IU]/h | INTRAVENOUS | Status: DC
  Administered 2022-11-10: 1600 [IU]/h via INTRAVENOUS
  Administered 2022-11-11: 1850 [IU]/h via INTRAVENOUS
  Administered 2022-11-12: 2050 [IU]/h via INTRAVENOUS
  Filled 2022-11-10 (×4): qty 250

## 2022-11-10 MED ORDER — LEVETIRACETAM ER 500 MG PO TB24
1000.0000 mg | ORAL_TABLET | Freq: Two times a day (BID) | ORAL | Status: DC
  Administered 2022-11-10 – 2022-11-13 (×7): 1000 mg via ORAL
  Filled 2022-11-10 (×9): qty 2

## 2022-11-10 MED ORDER — ONDANSETRON HCL 4 MG/2ML IJ SOLN: 4.0000 mg | Freq: Four times a day (QID) | INTRAMUSCULAR | Status: DC | PRN

## 2022-11-10 MED ORDER — CLONAZEPAM 0.5 MG PO TABS
0.5000 mg | ORAL_TABLET | Freq: Two times a day (BID) | ORAL | Status: DC
  Administered 2022-11-10 – 2022-11-18 (×14): 0.5 mg via ORAL
  Filled 2022-11-10 (×15): qty 1

## 2022-11-10 MED ORDER — PANTOPRAZOLE SODIUM 40 MG PO TBEC
80.0000 mg | DELAYED_RELEASE_TABLET | Freq: Every day | ORAL | Status: DC
  Administered 2022-11-11 – 2022-11-23 (×12): 80 mg via ORAL
  Filled 2022-11-10 (×12): qty 2

## 2022-11-10 MED ORDER — CITALOPRAM HYDROBROMIDE 20 MG PO TABS
40.0000 mg | ORAL_TABLET | Freq: Every morning | ORAL | Status: DC
  Administered 2022-11-11 – 2022-11-23 (×12): 40 mg via ORAL
  Filled 2022-11-10 (×12): qty 2

## 2022-11-10 MED ORDER — PERAMPANEL 8 MG PO TABS
8.0000 mg | ORAL_TABLET | Freq: Every day | ORAL | Status: DC
  Administered 2022-11-10: 8 mg via ORAL
  Filled 2022-11-10: qty 1

## 2022-11-10 MED ORDER — ASPIRIN 81 MG PO TBEC
81.0000 mg | DELAYED_RELEASE_TABLET | Freq: Every day | ORAL | Status: DC
  Administered 2022-11-11 – 2022-11-13 (×2): 81 mg via ORAL
  Filled 2022-11-10 (×3): qty 1

## 2022-11-10 MED ORDER — SODIUM CHLORIDE 0.9 % IV SOLN
INTRAVENOUS | Status: DC
  Administered 2022-11-10: 10 mL via INTRAVENOUS

## 2022-11-10 MED ORDER — ASPIRIN 81 MG PO CHEW: 324.0000 mg | CHEWABLE_TABLET | Freq: Once | ORAL | Status: DC

## 2022-11-10 MED ORDER — HEPARIN BOLUS VIA INFUSION
2000.0000 [IU] | Freq: Once | INTRAVENOUS | Status: AC
  Administered 2022-11-10: 2000 [IU] via INTRAVENOUS
  Filled 2022-11-10: qty 2000

## 2022-11-10 MED ORDER — SODIUM CHLORIDE 0.9% FLUSH
3.0000 mL | Freq: Two times a day (BID) | INTRAVENOUS | Status: DC
  Administered 2022-11-10 – 2022-11-13 (×7): 3 mL via INTRAVENOUS

## 2022-11-10 MED ORDER — FAMOTIDINE 20 MG PO TABS
20.0000 mg | ORAL_TABLET | Freq: Every day | ORAL | Status: DC
  Administered 2022-11-10 – 2022-11-11 (×2): 20 mg via ORAL
  Filled 2022-11-10 (×2): qty 1

## 2022-11-10 MED ORDER — ROSUVASTATIN CALCIUM 20 MG PO TABS
40.0000 mg | ORAL_TABLET | Freq: Every day | ORAL | Status: DC
  Administered 2022-11-10: 40 mg via ORAL
  Filled 2022-11-10 (×2): qty 2

## 2022-11-10 MED ORDER — ALPRAZOLAM 0.25 MG PO TABS: 0.2500 mg | ORAL_TABLET | Freq: Two times a day (BID) | ORAL | Status: DC | PRN

## 2022-11-10 MED ORDER — LISINOPRIL 5 MG PO TABS
2.5000 mg | ORAL_TABLET | Freq: Every day | ORAL | Status: DC
  Filled 2022-11-10: qty 1

## 2022-11-10 MED ORDER — ACETAMINOPHEN 325 MG PO TABS: 650.0000 mg | ORAL_TABLET | ORAL | Status: DC | PRN

## 2022-11-10 NOTE — ED Notes (Signed)
Got patient on the the cardiac pads patient is resting with nurse at bedside and family

## 2022-11-10 NOTE — ED Provider Notes (Signed)
[facility] EMERGENCY DEPARTMENT Provider Note   CSN: [id number] Arrival date & time: 11/10/22  7829     History  Chief complaint: Chest pain.  James Torres is a 57 y.o. male.  HPI   Patient presents to the ED for evaluation of chest pain.  Patient does have history of reflux, seizures, migraines, coronary artery disease, ischemic cardiomyopathy, morbid obesity.  Patient states he started having chest pain yesterday.  Some pressure discomfort in his chest similar to when he had a heart attack.  He denies any fevers chills or cough.  Symptoms became more severe this morning.  He called EMS.  Patient was given aspirin and nitroglycerin.  Patient states his symptoms are better now a 3 out of 10  Home Medications Prior to Admission medications   Medication Sig Start Date End Date Taking? Authorizing Provider  acetaminophen (TYLENOL) 500 MG tablet Take 1,000 mg by mouth every 6 (six) hours as needed for mild pain.   Yes [provider]  aspirin EC 81 MG EC tablet Take 1 tablet (81 mg total) by mouth daily. 03/23/15  Yes Isaiah Serge, NP  CALCIUM CARBONATE-VITAMIN D PO Take 1 tablet by mouth daily with breakfast.   Yes [provider]  carvedilol (COREG) 6.25 MG tablet Take 1 tablet by mouth in the morning and at bedtime. Patient taking differently: Take 6.25 mg by mouth 2 (two) times daily with a meal. 03/01/22  Yes Marylu Lund., NP  Cholecalciferol (VITAMIN D) 125 MCG (5000 UT) CAPS Take 5,000 Units by mouth daily.   Yes [provider]  citalopram (CELEXA) 40 MG tablet Take 40 mg by mouth every morning.   Yes [provider]  clonazePAM (KLONOPIN) 0.5 MG tablet Take 0.25-0.5 mg by mouth 2 (two) times daily.   Yes [provider]  famotidine (PEPCID) 20 MG tablet TAKE 1 TABLET BY MOUTH EVERY DAY Patient taking differently: Take 20 mg by mouth at bedtime. 05/15/22  Yes Marylu Lund., NP  Fremanezumab-vfrm (AJOVY)  225 MG/1.5ML SOAJ Inject 1 Dose into the skin every 30 (thirty) days.   Yes [provider]  ibuprofen (ADVIL) 600 MG tablet Take 600 mg by mouth 2 (two) times daily as needed for mild pain. 10/09/22  Yes [provider]  lamoTRIgine (LAMICTAL) 100 MG tablet Take 200 mg by mouth in the morning and at bedtime.   Yes [provider]  levETIRAcetam (KEPPRA XR) 500 MG 24 hr tablet Take 1,000 mg by mouth 2 (two) times daily.   Yes [provider]  lisinopril (ZESTRIL) 2.5 MG tablet Take 1 tablet (2.5 mg total) by mouth daily. 03/01/22  Yes Marylu Lund., NP  Melatonin 5 MG CHEW Chew 15-20 mg by mouth at bedtime.   Yes [provider]  nitroGLYCERIN (NITROSTAT) 0.4 MG SL tablet Place 1 tablet (0.4 mg total) under the tongue every 5 (five) minutes as needed for chest pain (x 3 pills daily). 03/01/22  Yes Marylu Lund., NP  oxyCODONE-acetaminophen (ROXICET) 5-325 MG per tablet Take 1-2 tablets by mouth every 4 (four) hours as needed for severe pain. 03/23/15  Yes Isaiah Serge, NP  pantoprazole (PROTONIX) 40 MG tablet Take 2 tablets (80 mg total) by mouth daily. 03/01/22  Yes Marylu Lund., NP  perampanel [facility]) 8 MG tablet Take 1 tablet by mouth daily. 01/12/22  Yes [provider]  rosuvastatin (CRESTOR) 40 MG tablet Take 1  tablet (40 mg total) by mouth daily. 03/01/22  Yes Marylu Lund., NP  Ubrogepant (UBRELVY) 100 MG TABS Take 1-2 tablets by mouth daily as needed (HEADACHE).   Yes [provider]      Allergies    Zithromax [azithromycin]    Review of Systems   Review of Systems  Physical Exam Updated Vital Signs BP 110/65   Pulse 75   Temp 97.6 F (36.4 C) (Oral)   Resp 20   Ht 1.829 m (6')   Wt (!) 179.2 kg   SpO2 95%   BMI 53.57 kg/m  Physical Exam Vitals and nursing note reviewed.  Constitutional:      Appearance: He is well-developed. He is obese. He is not diaphoretic.  HENT:     Head:  Normocephalic and atraumatic.     Right Ear: External ear normal.     Left Ear: External ear normal.  Eyes:     General: No scleral icterus.       Right eye: No discharge.        Left eye: No discharge.     Conjunctiva/sclera: Conjunctivae normal.  Neck:     Trachea: No tracheal deviation.  Cardiovascular:     Rate and Rhythm: Normal rate and regular rhythm.  Pulmonary:     Effort: Pulmonary effort is normal. No respiratory distress.     Breath sounds: Normal breath sounds. No stridor. No wheezing or rales.  Abdominal:     General: Bowel sounds are normal. There is no distension.     Palpations: Abdomen is soft.     Tenderness: There is no abdominal tenderness. There is no guarding or rebound.  Musculoskeletal:        General: No tenderness or deformity.     Cervical back: Neck supple.  Skin:    General: Skin is warm and dry.     Findings: No rash.  Neurological:     General: No focal deficit present.     Mental Status: He is alert.     Cranial Nerves: No cranial nerve deficit, dysarthria or facial asymmetry.     Sensory: No sensory deficit.     Motor: No abnormal muscle tone or seizure activity.     Coordination: Coordination normal.  Psychiatric:        Mood and Affect: Mood normal.     ED Results / Procedures / Treatments   Labs (all labs ordered are listed, but only abnormal results are displayed) Labs Reviewed  COMPREHENSIVE METABOLIC PANEL - Abnormal; Notable for the following components:      Result Value   Glucose, Bld 173 (*)    All other components within normal limits  I-STAT CHEM 8, ED - Abnormal; Notable for the following components:   Glucose, Bld 169 (*)    Calcium, Ion 1.10 (*)    All other components within normal limits  TROPONIN I (HIGH SENSITIVITY) - Abnormal; Notable for the following components:   Troponin I (High Sensitivity) 177 (*)    All other components within normal limits  TROPONIN I (HIGH SENSITIVITY) - Abnormal; Notable for the  following components:   Troponin I (High Sensitivity) 969 (*)    All other components within normal limits  CBC WITH DIFFERENTIAL/PLATELET  PROTIME-INR  APTT  LIPID PANEL  HEMOGLOBIN A1C  HEPARIN LEVEL (UNFRACTIONATED)  HIV ANTIBODY (ROUTINE TESTING W REFLEX)  MAGNESIUM  TSH  T4, FREE    EKG EKG Interpretation  Date/Time:  Saturday November 10 2022  08:15:43 EST Ventricular Rate:  76 PR Interval:  169 QRS Duration: 82 QT Interval:  358 QTC Calculation: 403 R Axis:   -16 Text Interpretation: Sinus rhythm Borderline left axis deviation Low voltage, precordial leads Consider anterior infarct Minimal ST depression, inferior leads anterio st elevation Confirmed by Dorie Rank [phone]) on 11/10/2022 8:17:16 AM  Radiology DG Chest Port 1 View  Result Date: 11/10/2022 CLINICAL DATA:  Chest pain EXAM: PORTABLE CHEST 1 VIEW COMPARISON:  October 28, 2017 FINDINGS: Haziness over the lateral left lung base may be positional/due to overlapping soft tissues. The heart, hila, mediastinum, lungs, and pleura are otherwise unremarkable. IMPRESSION: Haziness over the lateral left lung base may be positional/due to overlapping soft tissues. A PA and lateral chest x-ray may better evaluate if clinically warranted. No other abnormalities. Electronically Signed   By: Dorise Bullion III M.D.   On: 11/10/2022 08:46    Procedures .Critical Care  Performed by: Dorie Rank, MD Authorized by: Dorie Rank, MD   Critical care provider statement:    Critical care time (minutes):  35   Critical care was time spent personally by me on the following activities:  Development of treatment plan with patient or surrogate, discussions with consultants, evaluation of patient's response to treatment, examination of patient, ordering and review of laboratory studies, ordering and review of radiographic studies, ordering and performing treatments and interventions, pulse oximetry, re-evaluation of patient's condition and review  of old charts     Medications Ordered in ED Medications  0.9 %  sodium chloride infusion (10 mLs Intravenous New Bag/Given 11/10/22 0938)  aspirin chewable tablet 324 mg (0 mg Oral Hold 11/10/22 0830)  nitroGLYCERIN (NITROGLYN) 2 % ointment 1 inch (1 inch Topical Given 11/10/22 1254)  heparin ADULT infusion 100 units/mL (25000 units/237m) (1,600 Units/hr Intravenous New Bag/Given 11/10/22 1300)  carvedilol (COREG) tablet 6.25 mg (has no administration in time range)  lisinopril (ZESTRIL) tablet 2.5 mg (has no administration in time range)  nitroGLYCERIN (NITROSTAT) SL tablet 0.4 mg (has no administration in time range)  rosuvastatin (CRESTOR) tablet 40 mg (40 mg Oral Given 11/10/22 1357)  citalopram (CELEXA) tablet 40 mg (has no administration in time range)  famotidine (PEPCID) tablet 20 mg (has no administration in time range)  pantoprazole (PROTONIX) EC tablet 80 mg (has no administration in time range)  clonazePAM (KLONOPIN) tablet 0.5 mg (has no administration in time range)  lamoTRIgine (LAMICTAL) tablet 200 mg (has no administration in time range)  levETIRAcetam (KEPPRA XR) 24 hr tablet 1,000 mg (has no administration in time range)  perampanel (FYCOMPA) tablet 8 mg (has no administration in time range)  aspirin EC tablet 81 mg (has no administration in time range)  acetaminophen (TYLENOL) tablet 650 mg (has no administration in time range)  ondansetron (ZOFRAN) injection 4 mg (has no administration in time range)  0.9 %  sodium chloride infusion (0 mLs Intravenous Hold 11/10/22 1347)  nitroGLYCERIN (NITROGLYN) 2 % ointment 1 inch (1 inch Topical Not Given 11/10/22 1348)  ALPRAZolam (XANAX) tablet 0.25 mg (has no administration in time range)  sodium chloride flush (NS) 0.9 % injection 3 mL (3 mLs Intravenous Given 11/10/22 1341)  heparin injection 4,000 Units (4,000 Units Intravenous Given 11/10/22 0935)  morphine (PF) 4 MG/ML injection 4 mg (4 mg Intravenous Given 11/10/22 0933)  heparin bolus  via infusion 2,000 Units (2,000 Units Intravenous Bolus from Bag 11/10/22 1300)    ED Course/ Medical Decision Making/ A&P Clinical Course as of 11/10/22 1547  Sat Nov 10, 2022  0830 Initial EKG concerning for ST elevation MI.  Code STEMI activated [JK]  0905 Cardiology evaluated patient.  Does not feel EKG is consistent with code STEMI.  Will proceed with chest pain workup [JK]  1017 Troponin I (High Sensitivity)(!!) Troponin elevated at 177 [JK]  1018 CBC with Differential/Platelet CBC normal CBC normal [JK]  1018 Comprehensive metabolic panel(!) Hyperglycemia noted [JK]  1019 DG Chest Port 1 View Haziness noted left lateral lung base.  No respiratory symptoms doubt pneumonia [JK]  1116 Case discussed with Dr Harrell Gave regarding admission [JK]    Clinical Course User Index [JK] Dorie Rank, MD                           Medical Decision Making Symptoms concerning for the possibility of acute coronary syndrome, morning cardial infarction, pneumonia pneumothorax  Problems Addressed: NSTEMI (non-ST elevated myocardial infarction) [facility]): acute illness or injury that poses a threat to life or bodily functions  Amount and/or Complexity of Data Reviewed Labs: ordered. Decision-making details documented in ED Course. Radiology: ordered and independent interpretation performed. Decision-making details documented in ED Course.  Risk OTC drugs. Prescription drug management. Drug therapy requiring intensive monitoring for toxicity. Decision regarding hospitalization.   Patient presented to the ED for evaluation of chest pain.  Known history of coronary artery disease.  No respiratory symptoms.  Doubt PE.  Initial EKG concerning for possible acute ischemia.  Cardiology evaluated patient and did not feel patient had an acute ST elevation MI.  Recommendation was to continue with pain workup.  Patient's ED evaluation is notable for elevated troponin.  Patient's symptoms have improved.  He is  no longer having any chest pain.  Patient was treated with aspirin and heparin nitroglycerin earlier and morphine.  Patient will be admitted to the cardiology service for further treatment        Final Clinical Impression(s) / ED Diagnoses Final diagnoses:  NSTEMI (non-ST elevated myocardial infarction) [facility])    Rx / [place] Orders ED Discharge Orders     None         Dorie Rank, MD 11/10/22 1547

## 2022-11-10 NOTE — ED Notes (Signed)
Echo at bedside

## 2022-11-10 NOTE — H&P (Addendum)
Cardiology History and Physical   Patient ID: James Torres MRN: [id number]; DOB: [birth year]  Admit date: 11/10/2022 Date of Consult: 11/10/2022  PCP:  Lawerance Cruel, MD   [facility] Cardiologist:  Sinclair Grooms, MD       CC: chest pain Patient Profile:   James Torres is a 57 y.o. male with a hx of CAD, NSTMI, with transient reduction on LV function, to 35-40% though with recovery to 50-55% after PCI and stent of LAD with 11 mm long Synergy DES May 2016, HLD, obesity, sleep apnea, anxiety disorder and in 2019  gamma knife therapy of benign brain tumor-epidermoid cyst, tremor-essential who is being seen 11/10/2022 for the evaluation of Chest pain at the request of Dr. Tomi Bamberger.[name]  History of Present Illness:   James Torres with above hx and stent to LAD with NSTEMI in 2016 and transient decrease in LV function now presents to ER by EMS, with chest pain,  took 2 NTG at home with relief, and 2 NTG and 324 mg ASA by EMS.  Pt notes his pain is like pain with NSTEMI.   Initially called STEMI, the EKG did not meet criteria for stemi and it was cancelled.    He has been started on IV Heparin. With bolus only.    He tells me he had some mild chest pain here and there over a week but not significant.  Today the pain returned and was in his Left arm which was very similar to MI in 2016.  He took 2 NTG with some improvement with each one.  He was SOB and had a cold sweat, also with nausea.  After morphine in ER pain resolved.  Though now he feels it returning somewhat.      EKG:  The EKG was personally reviewed and demonstrates:  SR with mild ST depression inf leads and non specific T wave abnormality in ant leads Telemetry:  Telemetry was personally reviewed and demonstrates:  SR   Na 139 K+ 4.3 BUN 9 Cr 1.0  Glucose 173 LFTs WNL Hs troponin 177 CBC WNL  PCXR:  IMPRESSION: Haziness over the lateral left lung base may be positional/due to overlapping soft tissues. A  PA and lateral chest x-ray may better evaluate if clinically warranted. No other abnormalities.   BP 119/78 P 76 R 18 afebrile.     Past Medical History:  Diagnosis Date   Anxiety    Brain tumor ([place])    CAD (coronary artery disease)    a. 03/2015 NSTEMI: LM nl, LAD 50ost, 100p (2.75x38 Synergy DES), 75d, RI nl, LCX minor irregs, OM1 min irregs, RCA 50ost, EF 35-45%.   Depression    GERD (gastroesophageal reflux disease)    Hyperlipidemia    Ischemic cardiomyopathy    a. 03/2015 EF 35-45% by LV gram @ time of NSTEMI;  b. 03/2015 Echo: EF 50-55%, sev HK to DK of dist an, apical, and infap walls.Gr 1 DD.   Kidney stone    Metabolic syndrome 8/54/6270   Migraines    Morbid obesity ([place])    Pre-diabetes    Seizures ([place])    a. Has auras and metalic taste in mouth.  Last one 06/2014- last a few seconds, has one every couple weeks, Dr Mare Loan is neurologist and is aware.    Sleep apnea 2009   a. On CPAP.   Tobacco abuse     Past Surgical History:  Procedure Laterality Date  BRAIN SURGERY  2004   Crainotomy for tumor   CARDIAC CATHETERIZATION N/A 03/21/2015   Procedure: Left Heart Cath and Coronary Angiography;  Surgeon: Sherren Mocha, MD;  Location: [facility];  Service: Cardiovascular;  Laterality: N/A;   KNEE ARTHROSCOPY Right 2005 approx   cartliage   ULNAR NERVE TRANSPOSITION Left 06/25/2014   Procedure: LEFT ULNAR NEUROPLASTY AT ELBOW;  Surgeon: Jolyn Nap, MD;  Location: [facility];  Service: Orthopedics;  Laterality: Left;   WISDOM TOOTH EXTRACTION       Home Medications:  Prior to Admission medications   Medication Sig Start Date End Date Taking? Authorizing Provider  acetaminophen (TYLENOL) 500 MG tablet Take 1,000 mg by mouth every 6 (six) hours as needed for mild pain.   Yes [provider]  aspirin EC 81 MG EC tablet Take 1 tablet (81 mg total) by mouth daily. 03/23/15  Yes Isaiah Serge, NP  CALCIUM CARBONATE-VITAMIN D PO Take 1 tablet by  mouth daily with breakfast.   Yes [provider]  carvedilol (COREG) 6.25 MG tablet Take 1 tablet by mouth in the morning and at bedtime. Patient taking differently: Take 6.25 mg by mouth 2 (two) times daily with a meal. 03/01/22  Yes Marylu Lund., NP  Cholecalciferol (VITAMIN D) 125 MCG (5000 UT) CAPS Take 5,000 Units by mouth daily.   Yes [provider]  citalopram (CELEXA) 40 MG tablet Take 40 mg by mouth every morning.   Yes [provider]  clonazePAM (KLONOPIN) 0.5 MG tablet Take 0.25-0.5 mg by mouth 2 (two) times daily.   Yes [provider]  famotidine (PEPCID) 20 MG tablet TAKE 1 TABLET BY MOUTH EVERY DAY Patient taking differently: Take 20 mg by mouth at bedtime. 05/15/22  Yes Marylu Lund., NP  Fremanezumab-vfrm (AJOVY) 225 MG/1.5ML SOAJ Inject 1 Dose into the skin every 30 (thirty) days.   Yes [provider]  ibuprofen (ADVIL) 600 MG tablet Take 600 mg by mouth 2 (two) times daily as needed for mild pain. 10/09/22  Yes [provider]  lamoTRIgine (LAMICTAL) 100 MG tablet Take 200 mg by mouth in the morning and at bedtime.   Yes [provider]  levETIRAcetam (KEPPRA XR) 500 MG 24 hr tablet Take 1,000 mg by mouth 2 (two) times daily.   Yes [provider]  lisinopril (ZESTRIL) 2.5 MG tablet Take 1 tablet (2.5 mg total) by mouth daily. 03/01/22  Yes Marylu Lund., NP  Melatonin 5 MG CHEW Chew 15-20 mg by mouth at bedtime.   Yes [provider]  nitroGLYCERIN (NITROSTAT) 0.4 MG SL tablet Place 1 tablet (0.4 mg total) under the tongue every 5 (five) minutes as needed for chest pain (x 3 pills daily). 03/01/22  Yes Marylu Lund., NP  oxyCODONE-acetaminophen (ROXICET) 5-325 MG per tablet Take 1-2 tablets by mouth every 4 (four) hours as needed for severe pain. 03/23/15  Yes Isaiah Serge, NP  pantoprazole (PROTONIX) 40 MG tablet Take 2 tablets (80 mg total) by mouth daily. 03/01/22  Yes Marylu Lund., NP  perampanel [facility]) 8 MG tablet Take 1 tablet by mouth daily. 01/12/22  Yes [provider]  rosuvastatin (CRESTOR) 40 MG tablet Take 1 tablet (40 mg total) by mouth daily. 03/01/22  Yes Marylu Lund., NP  Ubrogepant (UBRELVY) 100 MG TABS Take 1-2 tablets by mouth daily as needed (HEADACHE).   Yes [provider]  Inpatient Medications: Scheduled Meds:  aspirin  324 mg Oral Once   heparin  2,000 Units Intravenous Once   nitroGLYCERIN  1 inch Topical Q6H   Continuous Infusions:  sodium chloride 10 mL (11/10/22 0938)   heparin     PRN Meds:   Allergies:    Allergies  Allergen Reactions   Zithromax [Azithromycin] Hives    Social History:   Social History   Socioeconomic History   Marital status: Single    Spouse name: Not on file   Number of children: Not on file   Years of education: Not on file   Highest education level: Not on file  Occupational History   Not on file  Tobacco Use   Smoking status: Every Day    Packs/day: 0.50    Years: 20.00    Total pack years: 10.00    Types: Cigarettes   Smokeless tobacco: Never  Vaping Use   Vaping Use: Never used  Substance and Sexual Activity   Alcohol use: No    Alcohol/week: 0.0 standard drinks of alcohol   Drug use: No   Sexual activity: Not Currently  Other Topics Concern   Not on file  Social History Narrative   Not on file   Social Determinants of Health   Financial Resource Strain: Not on file  Food Insecurity: Not on file  Transportation Needs: Not on file  Physical Activity: Not on file  Stress: Not on file  Social Connections: Not on file  Intimate Partner Violence: Not on file    Family History:    Family History  Problem Relation Age of Onset   Healthy Mother    Heart disease Father    Stroke Father    Healthy Sister    Healthy Sister    Heart attack Neg Hx      ROS:  Please see the history of present illness.  General:no colds or fevers, no  weight changes Skin:no rashes or ulcers HEENT:no blurred vision, no congestion CV:see HPI PUL:see HPI  OSA on CPAP wears every night. GI:no diarrhea constipation or melena--, no indigestion GU:no hematuria, no dysuria MS:no joint pain, no claudication Neuro:no syncope, no lightheadedness  hx of seizures currently absence seizures episodically [facility] ,  Endo:no diabetes, no thyroid disease Psych + anxiety  All other ROS reviewed and negative.     Physical Exam/Data:   Vitals:   11/10/22 1130 11/10/22 1145 11/10/22 1200 11/10/22 1215  BP: 135/61 108/64 127/71 120/64  Pulse: 80 78 78 78  Resp: '13 18 14 '$ (!) 25  Temp:      TempSrc:      SpO2: 95% 95% 95% 95%  Weight:      Height:       No intake or output data in the 24 hours ending 11/10/22 1247    11/10/2022    8:41 AM 03/01/2022    3:37 PM 12/02/2020    4:54 PM  Last 3 Weights  Weight (lbs) 395 lb 397 lb 6.4 oz 397 lb  Weight (kg) 179.171 kg 180.259 kg 180.078 kg     Body mass index is 53.57 kg/m.  General:  Well nourished, well developed, obese male in no acute distress HEENT: normal Neck: no JVD Vascular: No carotid bruits; Distal pulses 2+ bilaterally Cardiac:  normal S1, S2; RRR; no murmur  gallup rub or click Lungs:  clear to diminished to auscultation bilaterally, no wheezing, rhonchi or rales  Abd: soft, nontender, no hepatomegaly  Ext:  no edema Musculoskeletal:  No deformities, BUE and BLE strength normal and equal Skin: warm and dry  Neuro:  alert and oriented X 3 MAE follows commands, no focal abnormalities noted Psych:  Normal affect    Relevant CV Studies: 04/10/22  carotid dopplers Summary:  Right Carotid: Velocities in the right ICA are consistent with a 1-39%  stenosis.   Left Carotid: Velocities in the left ICA are consistent with a 40-59%  stenosis.   Vertebrals: Bilateral vertebral arteries demonstrate antegrade flow.  Subclavians: Normal flow hemodynamics were seen in  bilateral subclavian               arteries.  TTE 03/22/2015 Study Conclusions   - Left ventricle: The cavity size was normal. There was moderate    focal basal hypertrophy of the septum. Systolic function was    normal. The estimated ejection fraction was in the range of 50%    to 55%. Definity contrast was administered - there is clear    distal anterior, apical and inferoapical severe hypokinesis to    dyskinesis. No thrombus was noted. The LV base is hyperdynamic.    Doppler parameters are consistent with abnormal left ventricular    relaxation (grade 1 diastolic dysfunction). The E/e&' ratio is    between 8-15, suggesting indeterminate LV filing pressure.  - Mitral valve: Mildly thickened leaflets . There was trivial    regurgitation.  - Left atrium: The atrium was normal in size.   Impressions:   - LVEF 50-55%, hyperdynamic base with distal anterior, apical and    inferoapical severe hypokinesis to dyskinesis, diastolic    dysfunction, indeterminate LV filling pressure, normal LA size.   Transthoracic echocardiography.  M-mode, complete 2D, spectral  Doppler, and color Doppler.  Birthdate:  Patient birthdate:  10/11/1966.  Age:  Patient is 57 yr old.  Sex:  Gender: male.  BMI: 51 kg/m^2.  Blood pressure:     127/62  Patient status:  Inpatient.  Study date:  Study date: 03/22/2015. Study time: 12:36  PM.  Location:  ICU/CCU   -------------------------------------------------------------------   -------------------------------------------------------------------  Left ventricle:  The cavity size was normal. There was moderate  focal basal hypertrophy of the septum. Systolic function was  normal. The estimated ejection fraction was in the range of 50% to  55%. Definity contrast was administered - there is clear distal  anterior, apical and inferoapical severe hypokinesis to dyskinesis.  No thrombus was noted. The LV base is hyperdynamic. Doppler  parameters are consistent  with abnormal left ventricular relaxation  (grade 1 diastolic dysfunction). The E/e&' ratio is between 8-15,  suggesting indeterminate LV filing pressure.   -------------------------------------------------------------------  Aortic valve:   Structurally normal valve. Trileaflet. Cusp  separation was normal.  Doppler:  Transvalvular velocity was within  the normal range. There was no stenosis. There was no  regurgitation.   -------------------------------------------------------------------  Aorta: Aortic root: The aortic root was normal in size.  Ascending aorta: The ascending aorta was normal in size.   -------------------------------------------------------------------  Mitral valve:   Mildly thickened leaflets .  Doppler:  There was  trivial regurgitation.    Peak gradient (D): 3 mm Hg.   -------------------------------------------------------------------  Left atrium:  The atrium was normal in size.   -------------------------------------------------------------------  Atrial septum:  Poorly visualized.   -------------------------------------------------------------------  Right ventricle:  Poorly visualized.   -------------------------------------------------------------------  Pulmonic valve:   Poorly visualized.  The valve appears to be  grossly normal.    Doppler:  There was  no significant  regurgitation.   -------------------------------------------------------------------  Tricuspid valve:   Doppler:  There was no significant  regurgitation.   -------------------------------------------------------------------  Pulmonary artery:   Poorly visualized. The main pulmonary artery  was normal-sized.   -------------------------------------------------------------------  Right atrium:  The atrium was normal in size.    Cardiac cath 03/21/15  1. Total occlusion of the mid-LAD treated successfully with PTCA, aspiration thrombectomy, and a Synergy DES. 2. Moderate  residual diffuse LAD stenosis 3. Moderate segmental LV systolic dysfunction, LVEF estimated at 40%   PLAN:  2D Echo ASA/plavix x 12 months as tolerated (**review with neurology/neurosurgery**) Aggressive risk reduction  Diagnostic Dominance: Right  Intervention     Laboratory Data:  High Sensitivity Troponin:   Recent Labs  Lab 11/10/22 0832  TROPONINIHS 177*     Chemistry Recent Labs  Lab 11/10/22 0832 11/10/22 0850  NA 139 141  K 4.3 4.3  CL 106 106  CO2 22  --   GLUCOSE 173* 169*  BUN 9 10  CREATININE 1.00 0.90  CALCIUM 9.0  --   GFRNONAA >60  --   ANIONGAP 11  --     Recent Labs  Lab 11/10/22 0832  PROT 6.6  ALBUMIN 3.7  AST 33  ALT 26  ALKPHOS 74  BILITOT 0.5   Lipids  Recent Labs  Lab 11/10/22 0832  CHOL 136  TRIG 120  HDL 41  LDLCALC 71  CHOLHDL 3.3    Hematology Recent Labs  Lab 11/10/22 0832 11/10/22 0850  WBC 6.6  --   RBC 4.81  --   HGB 15.0 15.3  HCT 46.1 45.0  MCV 95.8  --   MCH 31.2  --   MCHC 32.5  --   RDW 14.6  --   PLT 155  --    Thyroid No results for input(s): "TSH", "FREET4" in the last 168 hours.  BNPNo results for input(s): "BNP", "PROBNP" in the last 168 hours.  DDimer No results for input(s): "DDIMER" in the last 168 hours.   Radiology/Studies:  DG Chest Port 1 View  Result Date: 11/10/2022 CLINICAL DATA:  Chest pain EXAM: PORTABLE CHEST 1 VIEW COMPARISON:  October 28, 2017 FINDINGS: Haziness over the lateral left lung base may be positional/due to overlapping soft tissues. The heart, hila, mediastinum, lungs, and pleura are otherwise unremarkable. IMPRESSION: Haziness over the lateral left lung base may be positional/due to overlapping soft tissues. A PA and lateral chest x-ray may better evaluate if clinically warranted. No other abnormalities. Electronically Signed   By: Dorise Bullion III M.D.   On: 11/10/2022 08:46     Assessment and Plan:   NSTEMI awaiting follow up troponin, will recheck EKG - has  rec'd ASA, now on IV heparin,  on BB and statin.  Also on ACE lisiniopril 2.5 mg  pain completely resolved after morphine but feels like it is coming back.  Will add NTG paste for now.  Plan cardiac cath for Monday unless pain increases.   The patient understands that risks included but are not limited to stroke (1 in 1000), death (1 in 14), kidney failure [usually temporary] (1 in 500), bleeding (1 in 200), allergic reaction [possibly serious] (1 in 200).  CAD with prior NSTEMI and stent to LAD at that time 2016 he had distal LAD disease and minimal LCX and RCA disease.  Hx of ICM that recovered. Last EF 55% HLD T chol 136 HDL 41 LDL 71  HTN currently controlled  Tremor on  BB  Seizure disorder on medications OSA on CPAP at hs  Carotid disease on statin.     Risk Assessment/Risk Scores:     TIMI Risk Score for Unstable Angina or Non-ST Elevation MI:   The patient's TIMI risk score is 6, which indicates a 41% risk of all cause mortality, new or recurrent myocardial infarction or need for urgent revascularization in the next 14 days.          For questions or updates, please contact [facility] Please consult [email or web address] for contact info under    Signed, Cecilie Kicks, NP  11/10/2022 12:47 PM

## 2022-11-10 NOTE — ED Notes (Signed)
Provided pt a larger pt gown

## 2022-11-10 NOTE — ED Notes (Signed)
Provided pt cup of water

## 2022-11-10 NOTE — ED Notes (Signed)
Walked patient to the bathroom patient did well 

## 2022-11-10 NOTE — ED Notes (Signed)
Pt ambulated with a limp. RN stayed by his side and informed pt to pull string once finish. Pt states that is his baseline and he should be getting knee surgery once he lose 100lbs. Pt states if he stands and stretch for a second he feels fine.

## 2022-11-10 NOTE — Progress Notes (Signed)
ANTICOAGULATION CONSULT NOTE - Initial Consult  Pharmacy Consult for Heparin Indication: chest pain/ACS  Allergies  Allergen Reactions   Zithromax [Azithromycin] Hives    Patient Measurements: Height: 6' (182.9 cm) Weight: (!) 179.2 kg (395 lb) IBW/kg (Calculated) : 77.6 Heparin Dosing Weight: 121.7 kg  Vital Signs: Temp: 97.6 F (36.4 C) (01/06 0840) Temp Source: Oral (01/06 0840) BP: 135/61 (01/06 1130) Pulse Rate: 80 (01/06 1130)  Labs: Recent Labs    11/10/22 0832 11/10/22 0850  HGB 15.0 15.3  HCT 46.1 45.0  PLT 155  --   APTT 29  --   LABPROT 12.3  --   INR 0.9  --   CREATININE 1.00 0.90  TROPONINIHS 177*  --     Estimated Creatinine Clearance: 149.6 mL/min (by C-G formula based on SCr of 0.9 mg/dL).   Medical History: Past Medical History:  Diagnosis Date   Anxiety    Brain tumor ([place])    CAD (coronary artery disease)    a. 03/2015 NSTEMI: LM nl, LAD 50ost, 100p (2.75x38 Synergy DES), 75d, RI nl, LCX minor irregs, OM1 min irregs, RCA 50ost, EF 35-45%.   Depression    GERD (gastroesophageal reflux disease)    Hyperlipidemia    Ischemic cardiomyopathy    a. 03/2015 EF 35-45% by LV gram @ time of NSTEMI;  b. 03/2015 Echo: EF 50-55%, sev HK to DK of dist an, apical, and infap walls.Gr 1 DD.   Kidney stone    Metabolic syndrome 6/44/0347   Migraines    Morbid obesity ([place])    Pre-diabetes    Seizures ([place])    a. Has auras and metalic taste in mouth.  Last one 06/2014- last a few seconds, has one every couple weeks, Dr Mare Loan is neurologist and is aware.    Sleep apnea 2009   a. On CPAP.   Tobacco abuse     Medications:  (Not in a hospital admission)  Scheduled:   aspirin  324 mg Oral Once   nitroGLYCERIN  1 inch Topical Q6H   Infusions:   sodium chloride 10 mL (11/10/22 0938)   PRN:   Assessment: 52 yom with a history of CAD, NSTEMI. Patient is presenting with chest pain. Heparin per pharmacy consult placed for chest  pain/ACS.  Patient is not on anticoagulation prior to arrival. Received 4000 unit IV heparin bolus this morning (~0900) per STEMI work-up but heparin infusion not started.  Hgb 15.3; plt 155 aPTT 29 PT/INR 12.3/0.9  Goal of Therapy:  Heparin level 0.3-0.7 units/ml Monitor platelets by anticoagulation protocol: Yes   Plan:  Give IV heparin 2000 units bolus x 1 Start heparin infusion at 1600 units/hr Check anti-Xa level in 6 hours and daily while on heparin Continue to monitor H&H and platelets  Lorelei Pont, PharmD, BCPS 11/10/2022 12:28 PM ED Clinical Pharmacist -  [phone]

## 2022-11-10 NOTE — Progress Notes (Signed)
ANTICOAGULATION CONSULT NOTE - Follow up [place] for Heparin Indication: chest pain/ACS  Allergies  Allergen Reactions   Zithromax [Azithromycin] Hives    Patient Measurements: Height: 6' (182.9 cm) Weight: (!) 179.2 kg (395 lb) IBW/kg (Calculated) : 77.6 Heparin Dosing Weight: 121.7 kg  Vital Signs: Temp: 98.5 F (36.9 C) (01/06 1856) Temp Source: Oral (01/06 1856) BP: 103/68 (01/06 1856) Pulse Rate: 82 (01/06 1856)  Labs: Recent Labs    11/10/22 0832 11/10/22 0850 11/10/22 1020 11/10/22 1705 11/10/22 1937  HGB 15.0 15.3  --   --   --   HCT 46.1 45.0  --   --   --   PLT 155  --   --   --   --   APTT 29  --   --   --   --   LABPROT 12.3  --   --   --   --   INR 0.9  --   --   --   --   HEPARINUNFRC  --   --   --   --  0.22*  CREATININE 1.00 0.90  --   --   --   TROPONINIHS 177*  --  969* 4,401*  --      Estimated Creatinine Clearance: 149.6 mL/min (by C-G formula based on SCr of 0.9 mg/dL).   Medical History: Past Medical History:  Diagnosis Date   Anxiety    Brain tumor ([place])    CAD (coronary artery disease)    a. 03/2015 NSTEMI: LM nl, LAD 50ost, 100p (2.75x38 Synergy DES), 75d, RI nl, LCX minor irregs, OM1 min irregs, RCA 50ost, EF 35-45%.   Depression    GERD (gastroesophageal reflux disease)    Hyperlipidemia    Ischemic cardiomyopathy    a. 03/2015 EF 35-45% by LV gram @ time of NSTEMI;  b. 03/2015 Echo: EF 50-55%, sev HK to DK of dist an, apical, and infap walls.Gr 1 DD.   Kidney stone    Metabolic syndrome 3/81/0175   Migraines    Morbid obesity ([place])    Pre-diabetes    Seizures ([place])    a. Has auras and metalic taste in mouth.  Last one 06/2014- last a few seconds, has one every couple weeks, Dr Mare Loan is neurologist and is aware.    Sleep apnea 2009   a. On CPAP.   Tobacco abuse     Medications:  Medications Prior to Admission  Medication Sig Dispense Refill Last Dose   acetaminophen (TYLENOL) 500 MG tablet Take  1,000 mg by mouth every 6 (six) hours as needed for mild pain.   11/08/2022   aspirin EC 81 MG EC tablet Take 1 tablet (81 mg total) by mouth daily.   11/10/2022   CALCIUM CARBONATE-VITAMIN D PO Take 1 tablet by mouth daily with breakfast.   11/10/2022   carvedilol (COREG) 6.25 MG tablet Take 1 tablet by mouth in the morning and at bedtime. (Patient taking differently: Take 6.25 mg by mouth 2 (two) times daily with a meal.) 180 tablet 3 11/10/2022 at 0730   Cholecalciferol (VITAMIN D) 125 MCG (5000 UT) CAPS Take 5,000 Units by mouth daily.   11/10/2022   citalopram (CELEXA) 40 MG tablet Take 40 mg by mouth every morning.   11/10/2022   clonazePAM (KLONOPIN) 0.5 MG tablet Take 0.25-0.5 mg by mouth 2 (two) times daily.   11/10/2022   famotidine (PEPCID) 20 MG tablet TAKE 1 TABLET BY MOUTH EVERY DAY (  Patient taking differently: Take 20 mg by mouth at bedtime.) 90 tablet 2 11/09/2022   Fremanezumab-vfrm (AJOVY) 225 MG/1.5ML SOAJ Inject 1 Dose into the skin every 30 (thirty) days.   10/16/2022   ibuprofen (ADVIL) 600 MG tablet Take 600 mg by mouth 2 (two) times daily as needed for mild pain.   11/07/2022   lamoTRIgine (LAMICTAL) 100 MG tablet Take 200 mg by mouth in the morning and at bedtime.   11/10/2022   levETIRAcetam (KEPPRA XR) 500 MG 24 hr tablet Take 1,000 mg by mouth 2 (two) times daily.   11/10/2022   lisinopril (ZESTRIL) 2.5 MG tablet Take 1 tablet (2.5 mg total) by mouth daily. 90 tablet 2 11/10/2022   Melatonin 5 MG CHEW Chew 15-20 mg by mouth at bedtime.   11/09/2022   nitroGLYCERIN (NITROSTAT) 0.4 MG SL tablet Place 1 tablet (0.4 mg total) under the tongue every 5 (five) minutes as needed for chest pain (x 3 pills daily). 25 tablet 6 11/10/2022   oxyCODONE-acetaminophen (ROXICET) 5-325 MG per tablet Take 1-2 tablets by mouth every 4 (four) hours as needed for severe pain. 40 tablet 0 Past Week   pantoprazole (PROTONIX) 40 MG tablet Take 2 tablets (80 mg total) by mouth daily. 180 tablet 3 11/10/2022   perampanel  (FYCOMPA) 8 MG tablet Take 1 tablet by mouth daily.   11/09/2022   rosuvastatin (CRESTOR) 40 MG tablet Take 1 tablet (40 mg total) by mouth daily. 90 tablet 3 11/09/2022   Ubrogepant (UBRELVY) 100 MG TABS Take 1-2 tablets by mouth daily as needed (HEADACHE).   unk    Scheduled:   aspirin  324 mg Oral Once   [START ON 11/11/2022] aspirin EC  81 mg Oral Daily   carvedilol  6.25 mg Oral BID WC   [START ON 11/11/2022] citalopram  40 mg Oral q morning   clonazePAM  0.5 mg Oral BID   famotidine  20 mg Oral QHS   [START ON 11/11/2022] lamoTRIgine  200 mg Oral Daily   levETIRAcetam  1,000 mg Oral BID   [START ON 11/11/2022] lisinopril  2.5 mg Oral Daily   nitroGLYCERIN  1 inch Topical Q6H   [START ON 11/11/2022] pantoprazole  80 mg Oral Daily   perampanel  8 mg Oral Daily   rosuvastatin  40 mg Oral Daily   sodium chloride flush  3 mL Intravenous Q12H   Infusions:   sodium chloride 10 mL (11/10/22 0938)   sodium chloride Stopped (11/10/22 1347)   heparin 1,600 Units/hr (11/10/22 1300)   PRN:   Assessment: 23 yom with a history of CAD, NSTEMI. Patient is presenting with chest pain. Heparin per pharmacy consult placed for chest pain/ACS.  Patient is not on anticoagulation prior to arrival. Received 4000 unit IV heparin bolus this morning (~0900) per STEMI work-up but heparin infusion not started. S/p heparin bolus 2000 uts IV x1 then increased heparin drip 1600 uts/hr with heparin level 0.22   Goal of Therapy:  Heparin level 0.3-0.7 units/ml Monitor platelets by anticoagulation protocol: Yes   Plan:  Increase heparin infusion 1700 units/hr Daily heparin level and CBC Monitor s/s bleeding     Bonnita Nasuti Pharm.D. CPP, BCPS Clinical Pharmacist [phone] 11/10/2022 9:05 PM

## 2022-11-10 NOTE — ED Triage Notes (Signed)
BP 188/100 HR 70 RA 95% CPAP at night

## 2022-11-10 NOTE — ED Triage Notes (Signed)
Pt bib ems from home c/o CP. Pt took 2 nitroglycerin at home with some relief. En route pt took 2 nigtroglycerin and 324 mg aspirin.  Pt states he had a stent and NSTEMI in 2016 and confirms that the sensation feels the same.

## 2022-11-10 NOTE — Progress Notes (Signed)
Interventional Cardiology Note:  Activated for STEMI for patient with a history of mid LAD PCI 2016.  Patient awoke with acute onset chest pain now better after NTG x 2.  EKG with minimal ST changes that do not make criteria for STEMI.  Patient chest pain improving.  Will defer emergency coronary angiography at this time; triage via chest pain pathway per ED.  Discussed with ED staff and patient.

## 2022-11-10 NOTE — Progress Notes (Signed)
*  PRELIMINARY RESULTS* Echocardiogram 2D Echocardiogram has been performed.  James Torres 11/10/2022, 4:35 PM

## 2022-11-10 NOTE — ED Notes (Signed)
Pt returned to room without RN and no incidence

## 2022-11-11 DIAGNOSIS — E7800 Pure hypercholesterolemia, unspecified: Secondary | ICD-10-CM | POA: Diagnosis not present

## 2022-11-11 DIAGNOSIS — I214 Non-ST elevation (NSTEMI) myocardial infarction: Secondary | ICD-10-CM | POA: Diagnosis not present

## 2022-11-11 DIAGNOSIS — I2510 Atherosclerotic heart disease of native coronary artery without angina pectoris: Secondary | ICD-10-CM

## 2022-11-11 DIAGNOSIS — I2583 Coronary atherosclerosis due to lipid rich plaque: Secondary | ICD-10-CM

## 2022-11-11 LAB — BASIC METABOLIC PANEL
Anion gap: 8 (ref 5–15)
BUN: 10 mg/dL (ref 6–20)
CO2: 25 mmol/L (ref 22–32)
Calcium: 9 mg/dL (ref 8.9–10.3)
Chloride: 102 mmol/L (ref 98–111)
Creatinine, Ser: 0.89 mg/dL (ref 0.61–1.24)
GFR, Estimated: >60 mL/min (ref >60)
Glucose, Bld: 99 mg/dL (ref 70–99)
Potassium: 3.6 mmol/L (ref 3.5–5.1)
Sodium: 135 mmol/L (ref 135–145)

## 2022-11-11 LAB — HEPARIN LEVEL (UNFRACTIONATED)
Heparin Unfractionated: 0.34 IU/mL (ref 0.30–0.70)
Heparin Unfractionated: 0.29 IU/mL — ABNORMAL LOW (ref 0.30–0.70)
Heparin Unfractionated: 0.29 IU/mL — ABNORMAL LOW (ref 0.30–0.70)

## 2022-11-11 LAB — CBC
HCT: 42.5 % (ref 39.0–52.0)
Hemoglobin: 13.8 g/dL (ref 13.0–17.0)
MCH: 30.5 pg (ref 26.0–34.0)
MCHC: 32.5 g/dL (ref 30.0–36.0)
MCV: 93.8 fL (ref 80.0–100.0)
Platelets: 159 10*3/uL (ref 150–400)
RBC: 4.53 MIL/uL (ref 4.22–5.81)
RDW: 14.8 % (ref 11.5–15.5)
WBC: 8.5 10*3/uL (ref 4.0–10.5)
nRBC: 0 % (ref 0.0–0.2)

## 2022-11-11 LAB — TROPONIN I (HIGH SENSITIVITY)
Troponin I (High Sensitivity): 2441 ng/L (ref <18)
Troponin I (High Sensitivity): 2566 ng/L (ref <18)

## 2022-11-11 MED ORDER — SODIUM CHLORIDE 0.9 % WEIGHT BASED INFUSION
3.0000 mL/kg/h | INTRAVENOUS | Status: DC
  Administered 2022-11-12: 3 mL/kg/h via INTRAVENOUS

## 2022-11-11 MED ORDER — SODIUM CHLORIDE 0.9 % WEIGHT BASED INFUSION
1.0000 mL/kg/h | INTRAVENOUS | Status: DC
  Administered 2022-11-12 (×2): 1 mL/kg/h via INTRAVENOUS

## 2022-11-11 MED ORDER — SODIUM CHLORIDE 0.9 % IV SOLN: 250.0000 mL | INTRAVENOUS | Status: DC | PRN

## 2022-11-11 MED ORDER — ASPIRIN 81 MG PO CHEW
81.0000 mg | CHEWABLE_TABLET | ORAL | Status: AC
  Administered 2022-11-12: 81 mg via ORAL
  Filled 2022-11-11: qty 1

## 2022-11-11 MED ORDER — ROSUVASTATIN CALCIUM 20 MG PO TABS
40.0000 mg | ORAL_TABLET | Freq: Every day | ORAL | Status: DC
  Administered 2022-11-11 – 2022-11-22 (×11): 40 mg via ORAL
  Filled 2022-11-11 (×12): qty 2

## 2022-11-11 MED ORDER — SODIUM CHLORIDE 0.9% FLUSH: 3.0000 mL | INTRAVENOUS | Status: DC | PRN

## 2022-11-11 MED ORDER — PERAMPANEL 8 MG PO TABS
8.0000 mg | ORAL_TABLET | Freq: Every day | ORAL | Status: DC
  Administered 2022-11-12 – 2022-11-22 (×11): 8 mg via ORAL
  Filled 2022-11-11 (×13): qty 1

## 2022-11-11 MED ORDER — NITROGLYCERIN 2 % TD OINT: 1.0000 [in_us] | TOPICAL_OINTMENT | Freq: Four times a day (QID) | TRANSDERMAL | Status: DC | PRN

## 2022-11-11 NOTE — Progress Notes (Signed)
ANTICOAGULATION CONSULT NOTE - Follow up [facility] for Heparin Indication: chest pain/ACS  Allergies  Allergen Reactions   Zithromax [Azithromycin] Hives    Patient Measurements: Height: 6' (182.9 cm) Weight: (!) 179.2 kg (395 lb) IBW/kg (Calculated) : 77.6 Heparin Dosing Weight: 121.7 kg  Vital Signs: Temp: 98.2 F (36.8 C) (01/07 0754) Temp Source: Oral (01/07 0754) BP: 101/62 (01/07 0754) Pulse Rate: 73 (01/07 0754)  Labs: Recent Labs    11/10/22 0832 11/10/22 0850 11/10/22 1020 11/10/22 1705 11/10/22 1937 11/11/22 0046  HGB 15.0 15.3  --   --   --  13.8  HCT 46.1 45.0  --   --   --  42.5  PLT 155  --   --   --   --  159  APTT 29  --   --   --   --   --   LABPROT 12.3  --   --   --   --   --   INR 0.9  --   --   --   --   --   HEPARINUNFRC  --   --   --   --  0.22* 0.34  CREATININE 1.00 0.90  --   --   --  0.89  TROPONINIHS 177*  --  969* 4,401*  --   --      Estimated Creatinine Clearance: 151.3 mL/min (by C-G formula based on SCr of 0.89 mg/dL).   Medical History: Past Medical History:  Diagnosis Date   Anxiety    Brain tumor ([place])    CAD (coronary artery disease)    a. 03/2015 NSTEMI: LM nl, LAD 50ost, 100p (2.75x38 Synergy DES), 75d, RI nl, LCX minor irregs, OM1 min irregs, RCA 50ost, EF 35-45%.   Depression    GERD (gastroesophageal reflux disease)    Hyperlipidemia    Ischemic cardiomyopathy    a. 03/2015 EF 35-45% by LV gram @ time of NSTEMI;  b. 03/2015 Echo: EF 50-55%, sev HK to DK of dist an, apical, and infap walls.Gr 1 DD.   Kidney stone    Metabolic syndrome 0/24/0973   Migraines    Morbid obesity ([place])    Pre-diabetes    Seizures ([place])    a. Has auras and metalic taste in mouth.  Last one 06/2014- last a few seconds, has one every couple weeks, Dr Mare Loan is neurologist and is aware.    Sleep apnea 2009   a. On CPAP.   Tobacco abuse     Medications:  Medications Prior to Admission  Medication Sig Dispense  Refill Last Dose   acetaminophen (TYLENOL) 500 MG tablet Take 1,000 mg by mouth every 6 (six) hours as needed for mild pain.   11/08/2022   aspirin EC 81 MG EC tablet Take 1 tablet (81 mg total) by mouth daily.   11/10/2022   CALCIUM CARBONATE-VITAMIN D PO Take 1 tablet by mouth daily with breakfast.   11/10/2022   carvedilol (COREG) 6.25 MG tablet Take 1 tablet by mouth in the morning and at bedtime. (Patient taking differently: Take 6.25 mg by mouth 2 (two) times daily with a meal.) 180 tablet 3 11/10/2022 at 0730   Cholecalciferol (VITAMIN D) 125 MCG (5000 UT) CAPS Take 5,000 Units by mouth daily.   11/10/2022   citalopram (CELEXA) 40 MG tablet Take 40 mg by mouth every morning.   11/10/2022   clonazePAM (KLONOPIN) 0.5 MG tablet Take 0.25-0.5 mg by mouth 2 (two)  times daily.   11/10/2022   famotidine (PEPCID) 20 MG tablet TAKE 1 TABLET BY MOUTH EVERY DAY (Patient taking differently: Take 20 mg by mouth at bedtime.) 90 tablet 2 11/09/2022   Fremanezumab-vfrm (AJOVY) 225 MG/1.5ML SOAJ Inject 1 Dose into the skin every 30 (thirty) days.   10/16/2022   ibuprofen (ADVIL) 600 MG tablet Take 600 mg by mouth 2 (two) times daily as needed for mild pain.   11/07/2022   lamoTRIgine (LAMICTAL) 100 MG tablet Take 200 mg by mouth in the morning and at bedtime.   11/10/2022   levETIRAcetam (KEPPRA XR) 500 MG 24 hr tablet Take 1,000 mg by mouth 2 (two) times daily.   11/10/2022   lisinopril (ZESTRIL) 2.5 MG tablet Take 1 tablet (2.5 mg total) by mouth daily. 90 tablet 2 11/10/2022   Melatonin 5 MG CHEW Chew 15-20 mg by mouth at bedtime.   11/09/2022   nitroGLYCERIN (NITROSTAT) 0.4 MG SL tablet Place 1 tablet (0.4 mg total) under the tongue every 5 (five) minutes as needed for chest pain (x 3 pills daily). 25 tablet 6 11/10/2022   oxyCODONE-acetaminophen (ROXICET) 5-325 MG per tablet Take 1-2 tablets by mouth every 4 (four) hours as needed for severe pain. 40 tablet 0 Past Week   pantoprazole (PROTONIX) 40 MG tablet Take 2 tablets (80 mg  total) by mouth daily. 180 tablet 3 11/10/2022   perampanel (FYCOMPA) 8 MG tablet Take 1 tablet by mouth daily.   11/09/2022   rosuvastatin (CRESTOR) 40 MG tablet Take 1 tablet (40 mg total) by mouth daily. 90 tablet 3 11/09/2022   Ubrogepant (UBRELVY) 100 MG TABS Take 1-2 tablets by mouth daily as needed (HEADACHE).   unk    Scheduled:   aspirin  324 mg Oral Once   [START ON 11/12/2022] aspirin  81 mg Oral Pre-Cath   aspirin EC  81 mg Oral Daily   carvedilol  6.25 mg Oral BID WC   citalopram  40 mg Oral q morning   clonazePAM  0.5 mg Oral BID   famotidine  20 mg Oral QHS   lamoTRIgine  200 mg Oral Daily   levETIRAcetam  1,000 mg Oral BID   lisinopril  2.5 mg Oral Daily   nitroGLYCERIN  1 inch Topical Q6H   pantoprazole  80 mg Oral Daily   perampanel  8 mg Oral Daily   rosuvastatin  40 mg Oral Daily   sodium chloride flush  3 mL Intravenous Q12H   Infusions:   sodium chloride Stopped (11/10/22 1251)   sodium chloride Stopped (11/10/22 1347)   sodium chloride     [START ON 11/12/2022] sodium chloride     Followed by   Derrill Memo ON 11/12/2022] sodium chloride     heparin 1,700 Units/hr (11/11/22 0600)   PRN:   Assessment: 59 yom with a history of CAD, NSTEMI. Patient is presenting with chest pain. Patient is not on anticoagulation prior to arrival. Heparin per pharmacy consult placed for chest pain/ACS.  Heparin level today is therapeutic at 0.34, on 1700 units/hr. Hgb 13.8, plt 159--stable. No line issues or signs/symptoms of bleeding reported.  Goal of Therapy:  Heparin level 0.3-0.7 units/ml Monitor platelets by anticoagulation protocol: Yes   Plan:  Continue heparin infusion @ 1700 units/hr F/u confirmatory heparin level in 6hrs  Daily heparin level and CBC Monitor s/sx of bleeding    Billey Gosling, PharmD PGY1 Pharmacy Resident 1/7/20247:57 AM

## 2022-11-11 NOTE — Progress Notes (Signed)
Reviewed BP medications with Dr. Harrell Gave this morning. PT's BP has been in low 100's and high 94F systolically. Held Lisinopril. Contacted again for 1200 dose Nitro paste, BP continues to be in same range, denies chest pain. Per Dr. Harrell Gave, will change order to PRN. PT aware.

## 2022-11-11 NOTE — Progress Notes (Signed)
Rounding Note    Patient Name: James Torres Date of Encounter: 11/11/2022  [place] Cardiologist: Buford Dresser, MD   Subjective   No acute events overnight. Has been ambulating to the bathroom, has mild shortness of breath but no chest pain since presentation. Reviewed echo findings, plans for cath.  Inpatient Medications    Scheduled Meds:  aspirin  324 mg Oral Once   [START ON 11/12/2022] aspirin  81 mg Oral Pre-Cath   aspirin EC  81 mg Oral Daily   carvedilol  6.25 mg Oral BID WC   citalopram  40 mg Oral q morning   clonazePAM  0.5 mg Oral BID   famotidine  20 mg Oral QHS   lamoTRIgine  200 mg Oral Daily   levETIRAcetam  1,000 mg Oral BID   nitroGLYCERIN  1 inch Topical Q6H   pantoprazole  80 mg Oral Daily   perampanel  8 mg Oral Daily   rosuvastatin  40 mg Oral Daily   sodium chloride flush  3 mL Intravenous Q12H   Continuous Infusions:  sodium chloride Stopped (11/10/22 1347)   sodium chloride     [START ON 11/12/2022] sodium chloride     Followed by   Derrill Memo ON 11/12/2022] sodium chloride     heparin 1,700 Units/hr (11/11/22 0600)   PRN Meds: sodium chloride, acetaminophen, nitroGLYCERIN, ondansetron (ZOFRAN) IV, sodium chloride flush   Vital Signs    Vitals:   11/10/22 2353 11/11/22 0316 11/11/22 0754 11/11/22 1151  BP: 118/67 110/64 101/62 (!) 93/55  Pulse:   73 74  Resp:   20 20  Temp: 97.8 F (36.6 C) 98.4 F (36.9 C) 98.2 F (36.8 C) 98.6 F (37 C)  TempSrc: Axillary Oral Oral Oral  SpO2: 93% 94% 93% 95%  Weight:      Height:        Intake/Output Summary (Last 24 hours) at 11/11/2022 1204 Last data filed at 11/11/2022 1125 Gross per 24 hour  Intake 1053.4 ml  Output 1350 ml  Net -296.6 ml      11/10/2022    8:41 AM 03/01/2022    3:37 PM 12/02/2020    4:54 PM  Last 3 Weights  Weight (lbs) 395 lb 397 lb 6.4 oz 397 lb  Weight (kg) 179.171 kg 180.259 kg 180.078 kg      Telemetry    NSR - Personally Reviewed  ECG     No new today - Personally Reviewed  Physical Exam   GEN: No acute distress.   Neck: No JVD Cardiac: RRR, no murmurs, rubs, or gallops.  Respiratory: Clear to auscultation bilaterally. GI: Soft, nontender, non-distended  MS: No edema; No deformity. Neuro:  Nonfocal  Psych: Normal affect   Labs    High Sensitivity Troponin:   Recent Labs  Lab 11/10/22 0832 11/10/22 1020 11/10/22 1705 11/11/22 0949  TROPONINIHS 177* 969* 4,401* 2,441*     Chemistry Recent Labs  Lab 11/10/22 0832 11/10/22 0850 11/10/22 1705 11/11/22 0046  NA 139 141  --  135  K 4.3 4.3  --  3.6  CL 106 106  --  102  CO2 22  --   --  25  GLUCOSE 173* 169*  --  99  BUN 9 10  --  10  CREATININE 1.00 0.90  --  0.89  CALCIUM 9.0  --   --  9.0  MG  --   --  2.0  --   PROT 6.6  --   --   --  ALBUMIN 3.7  --   --   --   AST 33  --   --   --   ALT 26  --   --   --   ALKPHOS 74  --   --   --   BILITOT 0.5  --   --   --   GFRNONAA >60  --   --  >60  ANIONGAP 11  --   --  8    Lipids  Recent Labs  Lab 11/10/22 0832  CHOL 136  TRIG 120  HDL 41  LDLCALC 71  CHOLHDL 3.3    Hematology Recent Labs  Lab 11/10/22 0832 11/10/22 0850 11/11/22 0046  WBC 6.6  --  8.5  RBC 4.81  --  4.53  HGB 15.0 15.3 13.8  HCT 46.1 45.0 42.5  MCV 95.8  --  93.8  MCH 31.2  --  30.5  MCHC 32.5  --  32.5  RDW 14.6  --  14.8  PLT 155  --  159   Thyroid  Recent Labs  Lab 11/10/22 1705  TSH 2.001  FREET4 0.95    BNPNo results for input(s): "BNP", "PROBNP" in the last 168 hours.  DDimer No results for input(s): "DDIMER" in the last 168 hours.   Radiology    ECHOCARDIOGRAM COMPLETE  Result Date: 11/10/2022    ECHOCARDIOGRAM REPORT   Patient Name:   James Torres [facility] Date of Exam: 11/10/2022 Medical Rec #:  [id number]       Height:       72.0 in Accession #:    [id number]      Weight:       395.0 lb Date of Birth:  [birth year]       BSA:          2.845 m Patient Age:    57 years        BP:           110/65 mmHg  Patient Gender: M               HR:           77 bpm. Exam Location:  Inpatient Procedure: 2D Echo, Cardiac Doppler, Color Doppler and Intracardiac            Opacification Agent Indications:    Chest Pain  History:        Patient has prior history of Echocardiogram examinations, most                 recent 03/22/2015. CHF, Previous Myocardial Infarction and CAD,                 Signs/Symptoms:Chest Pain; Risk Factors:Dyslipidemia and Current                 Smoker.  Sonographer:    Wenda Low Referring Phys: [place]  1. Left ventricular ejection fraction, by estimation, is 45 to 50%. The left ventricle has mildly decreased function. The left ventricle demonstrates regional wall motion abnormalities (see scoring diagram/findings for description). Left ventricular diastolic parameters are indeterminate.  2. Right ventricular systolic function is normal. The right ventricular size is normal.  3. Left atrial size was mildly dilated.  4. The mitral valve is normal in structure. Trivial mitral valve regurgitation. No evidence of mitral stenosis.  5. The aortic valve is grossly normal. Aortic valve regurgitation is not visualized. No aortic stenosis is present. Comparison(s): Changes from prior study are  noted. Wall motion worse in anterior wall compared to prior. Conclusion(s)/Recommendation(s): No left ventricular mural or apical thrombus/thrombi. FINDINGS  Left Ventricle: Left ventricular ejection fraction, by estimation, is 45 to 50%. The left ventricle has mildly decreased function. The left ventricle demonstrates regional wall motion abnormalities. Definity contrast agent was given IV to delineate the left ventricular endocardial borders. The left ventricular internal cavity size was normal in size. There is no left ventricular hypertrophy. Left ventricular diastolic parameters are indeterminate.  LV Wall Scoring: The entire anterior wall, mid and distal anterior septum, and entire apex  are hypokinetic. The antero-lateral wall, inferior wall, posterior wall, basal anteroseptal segment, mid inferoseptal segment, and basal inferoseptal segment are normal. Right Ventricle: The right ventricular size is normal. Right vetricular wall thickness was not well visualized. Right ventricular systolic function is normal. Left Atrium: Left atrial size was mildly dilated. Right Atrium: Right atrial size was normal in size. Pericardium: There is no evidence of pericardial effusion. Presence of epicardial fat layer. Mitral Valve: The mitral valve is normal in structure. Trivial mitral valve regurgitation. No evidence of mitral valve stenosis. MV peak gradient, 4.2 mmHg. The mean mitral valve gradient is 2.0 mmHg. Tricuspid Valve: The tricuspid valve is normal in structure. Tricuspid valve regurgitation is trivial. No evidence of tricuspid stenosis. Aortic Valve: The aortic valve is grossly normal. Aortic valve regurgitation is not visualized. No aortic stenosis is present. Aortic valve mean gradient measures 5.0 mmHg. Aortic valve peak gradient measures 9.2 mmHg. Aortic valve area, by VTI measures 3.06 cm. Pulmonic Valve: The pulmonic valve was not well visualized. Pulmonic valve regurgitation is not visualized. No evidence of pulmonic stenosis. Aorta: The aortic root and ascending aorta are structurally normal, with no evidence of dilitation. Venous: The inferior vena cava was not well visualized. IAS/Shunts: The atrial septum is grossly normal.  LEFT VENTRICLE PLAX 2D LVIDd:         5.70 cm   Diastology LVIDs:         4.20 cm   LV e' medial:    8.05 cm/s LV PW:         1.10 cm   LV E/e' medial:  13.7 LV IVS:        1.20 cm   LV e' lateral:   10.80 cm/s LVOT diam:     2.00 cm   LV E/e' lateral: 10.2 LV SV:         94 LV SV Index:   33 LVOT Area:     3.14 cm  RIGHT VENTRICLE RV Basal diam:  3.65 cm RV Mid diam:    2.80 cm LEFT ATRIUM             Index        RIGHT ATRIUM           Index LA diam:        4.60 cm  1.62 cm/m   RA Area:     18.30 cm LA Vol (A2C):   76.5 ml 26.89 ml/m  RA Volume:   51.80 ml  18.21 ml/m LA Vol (A4C):   66.8 ml 23.48 ml/m LA Biplane Vol: 74.9 ml 26.33 ml/m  AORTIC VALVE AV Area (Vmax):    2.50 cm AV Area (Vmean):   2.42 cm AV Area (VTI):     3.06 cm AV Vmax:           152.00 cm/s AV Vmean:          110.000 cm/s AV VTI:  0.308 m AV Peak Grad:      9.2 mmHg AV Mean Grad:      5.0 mmHg LVOT Vmax:         121.00 cm/s LVOT Vmean:        84.600 cm/s LVOT VTI:          0.300 m LVOT/AV VTI ratio: 0.97  AORTA Ao Root diam: 3.90 cm Ao Asc diam:  2.80 cm MITRAL VALVE                TRICUSPID VALVE MV Area (PHT): 3.28 cm     TR Peak grad:   17.5 mmHg MV Area VTI:   3.08 cm     TR Vmax:        209.00 cm/s MV Peak grad:  4.2 mmHg MV Mean grad:  2.0 mmHg     SHUNTS MV Vmax:       1.03 m/s     Systemic VTI:  0.30 m MV Vmean:      65.6 cm/s    Systemic Diam: 2.00 cm MV Decel Time: 231 msec MV E velocity: 110.00 cm/s MV A velocity: 96.80 cm/s MV E/A ratio:  1.14 Buford Dresser MD Electronically signed by Buford Dresser MD Signature Date/Time: 11/10/2022/8:46:54 PM    Final    DG Chest Port 1 View  Result Date: 11/10/2022 CLINICAL DATA:  Chest pain EXAM: PORTABLE CHEST 1 VIEW COMPARISON:  October 28, 2017 FINDINGS: Haziness over the lateral left lung base may be positional/due to overlapping soft tissues. The heart, hila, mediastinum, lungs, and pleura are otherwise unremarkable. IMPRESSION: Haziness over the lateral left lung base may be positional/due to overlapping soft tissues. A PA and lateral chest x-ray may better evaluate if clinically warranted. No other abnormalities. Electronically Signed   By: Dorise Bullion III M.D.   On: 11/10/2022 08:46    Cardiac Studies   Echo 11/10/22  1. Left ventricular ejection fraction, by estimation, is 45 to 50%. The  left ventricle has mildly decreased function. The left ventricle  demonstrates regional wall motion abnormalities  (see scoring  diagram/findings for description). Left ventricular  diastolic parameters are indeterminate.   2. Right ventricular systolic function is normal. The right ventricular  size is normal.   3. Left atrial size was mildly dilated.   4. The mitral valve is normal in structure. Trivial mitral valve  regurgitation. No evidence of mitral stenosis.   5. The aortic valve is grossly normal. Aortic valve regurgitation is not  visualized. No aortic stenosis is present.   Comparison(s): Changes from prior study are noted. Wall motion worse in  anterior wall compared to prior.   Conclusion(s)/Recommendation(s): No left ventricular mural or apical  thrombus/thrombi.   Patient Profile     57 y.o. male with a hx of CAD, NSTEMI, with transient reduction on LV function, to 35-40% though with recovery to 50-55% after PCI and stent of LAD with 38 mm long Synergy DES May 2016, HLD, obesity, sleep apnea, anxiety disorder, seizure disorder who is admitted with NSTEMI.  Assessment & Plan    NSTEMI CAD with prior PCI -hsTn trending up, max thus far 4401 -currently chest pain free -echo as above, anterior wall motion worse than prior, EF 45-50% -continue aspirin, heparin -continue carvedilol -counseled on tobacco cessation, continue to address -nitro ordered -continue rosuvastatin, consider pcsk9i as outpatient -phase I cardiac rehab -plan for cath 1/8, see consent 1/6   Prior ischemic cardiomyopathy with recovered EF -echo as above, anterior  wall motion worse than prior, EF 45-50% -on carvedilol -was on lisinopril prior to admission. BP has been soft, holding lisinopril for now   Hypercholesterolemia -recheck lipids show Tchol 136, TG 120, HDL 41, LDL 71 -pending lp(a) -continue rosuvastatin -consider pcsk9i as outpatient   Hypertension -renal function stable, but BP has been soft. Will hold lisinopril for now  Seizure disorder -continue lamotrigine, levetiracetam,  perampanel  He had seen Dr. Tamala Julian in the past, but he would like to see me at [facility] after discharge.  For questions or updates, please contact [place] Please consult [email or web address] for contact info under        Signed, Buford Dresser, MD  11/11/2022, 12:04 PM

## 2022-11-11 NOTE — Progress Notes (Signed)
ANTICOAGULATION CONSULT NOTE - Follow up [place] for Heparin Indication: chest pain/ACS  Allergies  Allergen Reactions   Zithromax [Azithromycin] Hives    Patient Measurements: Height: 6' (182.9 cm) Weight: (!) 179.2 kg (395 lb) IBW/kg (Calculated) : 77.6 Heparin Dosing Weight: 121.7 kg  Vital Signs: Temp: 98.6 F (37 C) (01/07 1151) Temp Source: Oral (01/07 1151) BP: 93/55 (01/07 1151) Pulse Rate: 74 (01/07 1151)  Labs: Recent Labs    11/10/22 0832 11/10/22 0850 11/10/22 1020 11/10/22 1705 11/10/22 1937 11/11/22 0046 11/11/22 0949 11/11/22 1139 11/11/22 1413 11/11/22 2055  HGB 15.0 15.3  --   --   --  13.8  --   --   --   --   HCT 46.1 45.0  --   --   --  42.5  --   --   --   --   PLT 155  --   --   --   --  159  --   --   --   --   APTT 29  --   --   --   --   --   --   --   --   --   LABPROT 12.3  --   --   --   --   --   --   --   --   --   INR 0.9  --   --   --   --   --   --   --   --   --   HEPARINUNFRC  --   --   --   --    < > 0.34  --   --  0.29* 0.29*  CREATININE 1.00 0.90  --   --   --  0.89  --   --   --   --   TROPONINIHS 177*  --    < > 4,401*  --   --  2,441* 2,566*  --   --    < > = values in this interval not displayed.     Estimated Creatinine Clearance: 151.3 mL/min (by C-G formula based on SCr of 0.89 mg/dL).   Medical History: Past Medical History:  Diagnosis Date   Anxiety    Brain tumor ([place])    CAD (coronary artery disease)    a. 03/2015 NSTEMI: LM nl, LAD 50ost, 100p (2.75x38 Synergy DES), 75d, RI nl, LCX minor irregs, OM1 min irregs, RCA 50ost, EF 35-45%.   Depression    GERD (gastroesophageal reflux disease)    Hyperlipidemia    Ischemic cardiomyopathy    a. 03/2015 EF 35-45% by LV gram @ time of NSTEMI;  b. 03/2015 Echo: EF 50-55%, sev HK to DK of dist an, apical, and infap walls.Gr 1 DD.   Kidney stone    Metabolic syndrome 9/47/6546   Migraines    Morbid obesity ([place])    Pre-diabetes    Seizures ([place])     a. Has auras and metalic taste in mouth.  Last one 06/2014- last a few seconds, has one every couple weeks, Dr Mare Loan is neurologist and is aware.    Sleep apnea 2009   a. On CPAP.   Tobacco abuse     Medications:  Medications Prior to Admission  Medication Sig Dispense Refill Last Dose   acetaminophen (TYLENOL) 500 MG tablet Take 1,000 mg by mouth every 6 (six) hours as needed for mild pain.   11/08/2022  aspirin EC 81 MG EC tablet Take 1 tablet (81 mg total) by mouth daily.   11/10/2022   CALCIUM CARBONATE-VITAMIN D PO Take 1 tablet by mouth daily with breakfast.   11/10/2022   carvedilol (COREG) 6.25 MG tablet Take 1 tablet by mouth in the morning and at bedtime. (Patient taking differently: Take 6.25 mg by mouth 2 (two) times daily with a meal.) 180 tablet 3 11/10/2022 at 0730   Cholecalciferol (VITAMIN D) 125 MCG (5000 UT) CAPS Take 5,000 Units by mouth daily.   11/10/2022   citalopram (CELEXA) 40 MG tablet Take 40 mg by mouth every morning.   11/10/2022   clonazePAM (KLONOPIN) 0.5 MG tablet Take 0.25-0.5 mg by mouth 2 (two) times daily.   11/10/2022   famotidine (PEPCID) 20 MG tablet TAKE 1 TABLET BY MOUTH EVERY DAY (Patient taking differently: Take 20 mg by mouth at bedtime.) 90 tablet 2 11/09/2022   Fremanezumab-vfrm (AJOVY) 225 MG/1.5ML SOAJ Inject 1 Dose into the skin every 30 (thirty) days.   10/16/2022   ibuprofen (ADVIL) 600 MG tablet Take 600 mg by mouth 2 (two) times daily as needed for mild pain.   11/07/2022   lamoTRIgine (LAMICTAL) 100 MG tablet Take 200 mg by mouth in the morning and at bedtime.   11/10/2022   levETIRAcetam (KEPPRA XR) 500 MG 24 hr tablet Take 1,000 mg by mouth 2 (two) times daily.   11/10/2022   lisinopril (ZESTRIL) 2.5 MG tablet Take 1 tablet (2.5 mg total) by mouth daily. 90 tablet 2 11/10/2022   Melatonin 5 MG CHEW Chew 15-20 mg by mouth at bedtime.   11/09/2022   nitroGLYCERIN (NITROSTAT) 0.4 MG SL tablet Place 1 tablet (0.4 mg total) under the tongue every 5 (five)  minutes as needed for chest pain (x 3 pills daily). 25 tablet 6 11/10/2022   oxyCODONE-acetaminophen (ROXICET) 5-325 MG per tablet Take 1-2 tablets by mouth every 4 (four) hours as needed for severe pain. 40 tablet 0 Past Week   pantoprazole (PROTONIX) 40 MG tablet Take 2 tablets (80 mg total) by mouth daily. 180 tablet 3 11/10/2022   perampanel (FYCOMPA) 8 MG tablet Take 1 tablet by mouth daily.   11/09/2022   rosuvastatin (CRESTOR) 40 MG tablet Take 1 tablet (40 mg total) by mouth daily. 90 tablet 3 11/09/2022   Ubrogepant (UBRELVY) 100 MG TABS Take 1-2 tablets by mouth daily as needed (HEADACHE).   unk    Scheduled:   aspirin  324 mg Oral Once   [START ON 11/12/2022] aspirin  81 mg Oral Pre-Cath   aspirin EC  81 mg Oral Daily   carvedilol  6.25 mg Oral BID WC   citalopram  40 mg Oral q morning   clonazePAM  0.5 mg Oral BID   famotidine  20 mg Oral QHS   lamoTRIgine  200 mg Oral Daily   levETIRAcetam  1,000 mg Oral BID   pantoprazole  80 mg Oral Daily   perampanel  8 mg Oral Daily   rosuvastatin  40 mg Oral Daily   sodium chloride flush  3 mL Intravenous Q12H   Infusions:   sodium chloride Stopped (11/10/22 1347)   sodium chloride     [START ON 11/12/2022] sodium chloride     Followed by   Derrill Memo ON 11/12/2022] sodium chloride     heparin 1,850 Units/hr (11/11/22 1520)   Assessment: 42 yom with a history of CAD, NSTEMI. Patient is presenting with chest pain. Patient is not on anticoagulation  prior to arrival. Heparin per pharmacy consult placed for chest pain/ACS.  Heparin level is now slightly subtherapeutic at 0.29, on 1700 units/hr. Hgb 13.8, plts 159--stable. No line issues or signs/symptoms of bleeding reported.  Heparin level still came back subtherapeutic. We will increase again and check in AM.  Goal of Therapy:  Heparin level 0.3-0.7 units/ml Monitor platelets by anticoagulation protocol: Yes   Plan:  Increase heparin infusion 2050 units/hr F/u heparin level in AM Daily  heparin level and CBC Monitor s/sx of bleeding    Onnie Boer, PharmD, BCIDP, AAHIVP, CPP Infectious Disease Pharmacist 11/11/2022 9:36 PM

## 2022-11-11 NOTE — Progress Notes (Addendum)
ANTICOAGULATION CONSULT NOTE - Follow up [place] for Heparin Indication: chest pain/ACS  Allergies  Allergen Reactions   Zithromax [Azithromycin] Hives    Patient Measurements: Height: 6' (182.9 cm) Weight: (!) 179.2 kg (395 lb) IBW/kg (Calculated) : 77.6 Heparin Dosing Weight: 121.7 kg  Vital Signs: Temp: 98.6 F (37 C) (01/07 1151) Temp Source: Oral (01/07 1151) BP: 93/55 (01/07 1151) Pulse Rate: 74 (01/07 1151)  Labs: Recent Labs    11/10/22 0832 11/10/22 0850 11/10/22 1020 11/10/22 1705 11/10/22 1937 11/11/22 0046 11/11/22 0949 11/11/22 1139 11/11/22 1413  HGB 15.0 15.3  --   --   --  13.8  --   --   --   HCT 46.1 45.0  --   --   --  42.5  --   --   --   PLT 155  --   --   --   --  159  --   --   --   APTT 29  --   --   --   --   --   --   --   --   LABPROT 12.3  --   --   --   --   --   --   --   --   INR 0.9  --   --   --   --   --   --   --   --   HEPARINUNFRC  --   --   --   --  0.22* 0.34  --   --  0.29*  CREATININE 1.00 0.90  --   --   --  0.89  --   --   --   TROPONINIHS 177*  --    < > 4,401*  --   --  2,441* 2,566*  --    < > = values in this interval not displayed.     Estimated Creatinine Clearance: 151.3 mL/min (by C-G formula based on SCr of 0.89 mg/dL).   Medical History: Past Medical History:  Diagnosis Date   Anxiety    Brain tumor ([place])    CAD (coronary artery disease)    a. 03/2015 NSTEMI: LM nl, LAD 50ost, 100p (2.75x38 Synergy DES), 75d, RI nl, LCX minor irregs, OM1 min irregs, RCA 50ost, EF 35-45%.   Depression    GERD (gastroesophageal reflux disease)    Hyperlipidemia    Ischemic cardiomyopathy    a. 03/2015 EF 35-45% by LV gram @ time of NSTEMI;  b. 03/2015 Echo: EF 50-55%, sev HK to DK of dist an, apical, and infap walls.Gr 1 DD.   Kidney stone    Metabolic syndrome 01/26/4009   Migraines    Morbid obesity ([place])    Pre-diabetes    Seizures ([place])    a. Has auras and metalic taste in mouth.  Last one 06/2014-  last a few seconds, has one every couple weeks, Dr Mare Loan is neurologist and is aware.    Sleep apnea 2009   a. On CPAP.   Tobacco abuse     Medications:  Medications Prior to Admission  Medication Sig Dispense Refill Last Dose   acetaminophen (TYLENOL) 500 MG tablet Take 1,000 mg by mouth every 6 (six) hours as needed for mild pain.   11/08/2022   aspirin EC 81 MG EC tablet Take 1 tablet (81 mg total) by mouth daily.   11/10/2022   CALCIUM CARBONATE-VITAMIN D PO Take 1 tablet by mouth  daily with breakfast.   11/10/2022   carvedilol (COREG) 6.25 MG tablet Take 1 tablet by mouth in the morning and at bedtime. (Patient taking differently: Take 6.25 mg by mouth 2 (two) times daily with a meal.) 180 tablet 3 11/10/2022 at 0730   Cholecalciferol (VITAMIN D) 125 MCG (5000 UT) CAPS Take 5,000 Units by mouth daily.   11/10/2022   citalopram (CELEXA) 40 MG tablet Take 40 mg by mouth every morning.   11/10/2022   clonazePAM (KLONOPIN) 0.5 MG tablet Take 0.25-0.5 mg by mouth 2 (two) times daily.   11/10/2022   famotidine (PEPCID) 20 MG tablet TAKE 1 TABLET BY MOUTH EVERY DAY (Patient taking differently: Take 20 mg by mouth at bedtime.) 90 tablet 2 11/09/2022   Fremanezumab-vfrm (AJOVY) 225 MG/1.5ML SOAJ Inject 1 Dose into the skin every 30 (thirty) days.   10/16/2022   ibuprofen (ADVIL) 600 MG tablet Take 600 mg by mouth 2 (two) times daily as needed for mild pain.   11/07/2022   lamoTRIgine (LAMICTAL) 100 MG tablet Take 200 mg by mouth in the morning and at bedtime.   11/10/2022   levETIRAcetam (KEPPRA XR) 500 MG 24 hr tablet Take 1,000 mg by mouth 2 (two) times daily.   11/10/2022   lisinopril (ZESTRIL) 2.5 MG tablet Take 1 tablet (2.5 mg total) by mouth daily. 90 tablet 2 11/10/2022   Melatonin 5 MG CHEW Chew 15-20 mg by mouth at bedtime.   11/09/2022   nitroGLYCERIN (NITROSTAT) 0.4 MG SL tablet Place 1 tablet (0.4 mg total) under the tongue every 5 (five) minutes as needed for chest pain (x 3 pills daily). 25 tablet 6  11/10/2022   oxyCODONE-acetaminophen (ROXICET) 5-325 MG per tablet Take 1-2 tablets by mouth every 4 (four) hours as needed for severe pain. 40 tablet 0 Past Week   pantoprazole (PROTONIX) 40 MG tablet Take 2 tablets (80 mg total) by mouth daily. 180 tablet 3 11/10/2022   perampanel (FYCOMPA) 8 MG tablet Take 1 tablet by mouth daily.   11/09/2022   rosuvastatin (CRESTOR) 40 MG tablet Take 1 tablet (40 mg total) by mouth daily. 90 tablet 3 11/09/2022   Ubrogepant (UBRELVY) 100 MG TABS Take 1-2 tablets by mouth daily as needed (HEADACHE).   unk    Scheduled:   aspirin  324 mg Oral Once   [START ON 11/12/2022] aspirin  81 mg Oral Pre-Cath   aspirin EC  81 mg Oral Daily   carvedilol  6.25 mg Oral BID WC   citalopram  40 mg Oral q morning   clonazePAM  0.5 mg Oral BID   famotidine  20 mg Oral QHS   lamoTRIgine  200 mg Oral Daily   levETIRAcetam  1,000 mg Oral BID   pantoprazole  80 mg Oral Daily   perampanel  8 mg Oral Daily   rosuvastatin  40 mg Oral Daily   sodium chloride flush  3 mL Intravenous Q12H   Infusions:   sodium chloride Stopped (11/10/22 1347)   sodium chloride     [START ON 11/12/2022] sodium chloride     Followed by   Derrill Memo ON 11/12/2022] sodium chloride     heparin 1,700 Units/hr (11/11/22 0600)   Assessment: 23 yom with a history of CAD, NSTEMI. Patient is presenting with chest pain. Patient is not on anticoagulation prior to arrival. Heparin per pharmacy consult placed for chest pain/ACS.  Heparin level is now slightly subtherapeutic at 0.29, on 1700 units/hr. Hgb 13.8, plts 159--stable. No line  issues or signs/symptoms of bleeding reported.  Goal of Therapy:  Heparin level 0.3-0.7 units/ml Monitor platelets by anticoagulation protocol: Yes   Plan:  Increase heparin infusion to 1850 units/hr F/u heparin level in 6hrs  Daily heparin level and CBC Monitor s/sx of bleeding    Billey Gosling, PharmD PGY1 Pharmacy Resident 1/7/20242:43 PM

## 2022-11-11 NOTE — H&P (View-Only) (Signed)
Rounding Note    Patient Name: James Torres Date of Encounter: 11/11/2022  [place] Cardiologist: Buford Dresser, MD   Subjective   No acute events overnight. Has been ambulating to the bathroom, has mild shortness of breath but no chest pain since presentation. Reviewed echo findings, plans for cath.  Inpatient Medications    Scheduled Meds:  aspirin  324 mg Oral Once   [START ON 11/12/2022] aspirin  81 mg Oral Pre-Cath   aspirin EC  81 mg Oral Daily   carvedilol  6.25 mg Oral BID WC   citalopram  40 mg Oral q morning   clonazePAM  0.5 mg Oral BID   famotidine  20 mg Oral QHS   lamoTRIgine  200 mg Oral Daily   levETIRAcetam  1,000 mg Oral BID   nitroGLYCERIN  1 inch Topical Q6H   pantoprazole  80 mg Oral Daily   perampanel  8 mg Oral Daily   rosuvastatin  40 mg Oral Daily   sodium chloride flush  3 mL Intravenous Q12H   Continuous Infusions:  sodium chloride Stopped (11/10/22 1347)   sodium chloride     [START ON 11/12/2022] sodium chloride     Followed by   Derrill Memo ON 11/12/2022] sodium chloride     heparin 1,700 Units/hr (11/11/22 0600)   PRN Meds: sodium chloride, acetaminophen, nitroGLYCERIN, ondansetron (ZOFRAN) IV, sodium chloride flush   Vital Signs    Vitals:   11/10/22 2353 11/11/22 0316 11/11/22 0754 11/11/22 1151  BP: 118/67 110/64 101/62 (!) 93/55  Pulse:   73 74  Resp:   20 20  Temp: 97.8 F (36.6 C) 98.4 F (36.9 C) 98.2 F (36.8 C) 98.6 F (37 C)  TempSrc: Axillary Oral Oral Oral  SpO2: 93% 94% 93% 95%  Weight:      Height:        Intake/Output Summary (Last 24 hours) at 11/11/2022 1204 Last data filed at 11/11/2022 1125 Gross per 24 hour  Intake 1053.4 ml  Output 1350 ml  Net -296.6 ml      11/10/2022    8:41 AM 03/01/2022    3:37 PM 12/02/2020    4:54 PM  Last 3 Weights  Weight (lbs) 395 lb 397 lb 6.4 oz 397 lb  Weight (kg) 179.171 kg 180.259 kg 180.078 kg      Telemetry    NSR - Personally Reviewed  ECG     No new today - Personally Reviewed  Physical Exam   GEN: No acute distress.   Neck: No JVD Cardiac: RRR, no murmurs, rubs, or gallops.  Respiratory: Clear to auscultation bilaterally. GI: Soft, nontender, non-distended  MS: No edema; No deformity. Neuro:  Nonfocal  Psych: Normal affect   Labs    High Sensitivity Troponin:   Recent Labs  Lab 11/10/22 0832 11/10/22 1020 11/10/22 1705 11/11/22 0949  TROPONINIHS 177* 969* 4,401* 2,441*     Chemistry Recent Labs  Lab 11/10/22 0832 11/10/22 0850 11/10/22 1705 11/11/22 0046  NA 139 141  --  135  K 4.3 4.3  --  3.6  CL 106 106  --  102  CO2 22  --   --  25  GLUCOSE 173* 169*  --  99  BUN 9 10  --  10  CREATININE 1.00 0.90  --  0.89  CALCIUM 9.0  --   --  9.0  MG  --   --  2.0  --   PROT 6.6  --   --   --  ALBUMIN 3.7  --   --   --   AST 33  --   --   --   ALT 26  --   --   --   ALKPHOS 74  --   --   --   BILITOT 0.5  --   --   --   GFRNONAA >60  --   --  >60  ANIONGAP 11  --   --  8    Lipids  Recent Labs  Lab 11/10/22 0832  CHOL 136  TRIG 120  HDL 41  LDLCALC 71  CHOLHDL 3.3    Hematology Recent Labs  Lab 11/10/22 0832 11/10/22 0850 11/11/22 0046  WBC 6.6  --  8.5  RBC 4.81  --  4.53  HGB 15.0 15.3 13.8  HCT 46.1 45.0 42.5  MCV 95.8  --  93.8  MCH 31.2  --  30.5  MCHC 32.5  --  32.5  RDW 14.6  --  14.8  PLT 155  --  159   Thyroid  Recent Labs  Lab 11/10/22 1705  TSH 2.001  FREET4 0.95    BNPNo results for input(s): "BNP", "PROBNP" in the last 168 hours.  DDimer No results for input(s): "DDIMER" in the last 168 hours.   Radiology    ECHOCARDIOGRAM COMPLETE  Result Date: 11/10/2022    ECHOCARDIOGRAM REPORT   Patient Name:   PRINCE COUEY [facility] Date of Exam: 11/10/2022 Medical Rec #:  [id number]       Height:       72.0 in Accession #:    [id number]      Weight:       395.0 lb Date of Birth:  Jul 16, 1966       BSA:          2.845 m Patient Age:    57 years        BP:           110/65 mmHg  Patient Gender: M               HR:           77 bpm. Exam Location:  Inpatient Procedure: 2D Echo, Cardiac Doppler, Color Doppler and Intracardiac            Opacification Agent Indications:    Chest Pain  History:        Patient has prior history of Echocardiogram examinations, most                 recent 03/22/2015. CHF, Previous Myocardial Infarction and CAD,                 Signs/Symptoms:Chest Pain; Risk Factors:Dyslipidemia and Current                 Smoker.  Sonographer:    Wenda Low Referring Phys: [place]  1. Left ventricular ejection fraction, by estimation, is 45 to 50%. The left ventricle has mildly decreased function. The left ventricle demonstrates regional wall motion abnormalities (see scoring diagram/findings for description). Left ventricular diastolic parameters are indeterminate.  2. Right ventricular systolic function is normal. The right ventricular size is normal.  3. Left atrial size was mildly dilated.  4. The mitral valve is normal in structure. Trivial mitral valve regurgitation. No evidence of mitral stenosis.  5. The aortic valve is grossly normal. Aortic valve regurgitation is not visualized. No aortic stenosis is present. Comparison(s): Changes from prior study are  noted. Wall motion worse in anterior wall compared to prior. Conclusion(s)/Recommendation(s): No left ventricular mural or apical thrombus/thrombi. FINDINGS  Left Ventricle: Left ventricular ejection fraction, by estimation, is 45 to 50%. The left ventricle has mildly decreased function. The left ventricle demonstrates regional wall motion abnormalities. Definity contrast agent was given IV to delineate the left ventricular endocardial borders. The left ventricular internal cavity size was normal in size. There is no left ventricular hypertrophy. Left ventricular diastolic parameters are indeterminate.  LV Wall Scoring: The entire anterior wall, mid and distal anterior septum, and entire apex  are hypokinetic. The antero-lateral wall, inferior wall, posterior wall, basal anteroseptal segment, mid inferoseptal segment, and basal inferoseptal segment are normal. Right Ventricle: The right ventricular size is normal. Right vetricular wall thickness was not well visualized. Right ventricular systolic function is normal. Left Atrium: Left atrial size was mildly dilated. Right Atrium: Right atrial size was normal in size. Pericardium: There is no evidence of pericardial effusion. Presence of epicardial fat layer. Mitral Valve: The mitral valve is normal in structure. Trivial mitral valve regurgitation. No evidence of mitral valve stenosis. MV peak gradient, 4.2 mmHg. The mean mitral valve gradient is 2.0 mmHg. Tricuspid Valve: The tricuspid valve is normal in structure. Tricuspid valve regurgitation is trivial. No evidence of tricuspid stenosis. Aortic Valve: The aortic valve is grossly normal. Aortic valve regurgitation is not visualized. No aortic stenosis is present. Aortic valve mean gradient measures 5.0 mmHg. Aortic valve peak gradient measures 9.2 mmHg. Aortic valve area, by VTI measures 3.06 cm. Pulmonic Valve: The pulmonic valve was not well visualized. Pulmonic valve regurgitation is not visualized. No evidence of pulmonic stenosis. Aorta: The aortic root and ascending aorta are structurally normal, with no evidence of dilitation. Venous: The inferior vena cava was not well visualized. IAS/Shunts: The atrial septum is grossly normal.  LEFT VENTRICLE PLAX 2D LVIDd:         5.70 cm   Diastology LVIDs:         4.20 cm   LV e' medial:    8.05 cm/s LV PW:         1.10 cm   LV E/e' medial:  13.7 LV IVS:        1.20 cm   LV e' lateral:   10.80 cm/s LVOT diam:     2.00 cm   LV E/e' lateral: 10.2 LV SV:         94 LV SV Index:   33 LVOT Area:     3.14 cm  RIGHT VENTRICLE RV Basal diam:  3.65 cm RV Mid diam:    2.80 cm LEFT ATRIUM             Index        RIGHT ATRIUM           Index LA diam:        4.60 cm  1.62 cm/m   RA Area:     18.30 cm LA Vol (A2C):   76.5 ml 26.89 ml/m  RA Volume:   51.80 ml  18.21 ml/m LA Vol (A4C):   66.8 ml 23.48 ml/m LA Biplane Vol: 74.9 ml 26.33 ml/m  AORTIC VALVE AV Area (Vmax):    2.50 cm AV Area (Vmean):   2.42 cm AV Area (VTI):     3.06 cm AV Vmax:           152.00 cm/s AV Vmean:          110.000 cm/s AV VTI:  0.308 m AV Peak Grad:      9.2 mmHg AV Mean Grad:      5.0 mmHg LVOT Vmax:         121.00 cm/s LVOT Vmean:        84.600 cm/s LVOT VTI:          0.300 m LVOT/AV VTI ratio: 0.97  AORTA Ao Root diam: 3.90 cm Ao Asc diam:  2.80 cm MITRAL VALVE                TRICUSPID VALVE MV Area (PHT): 3.28 cm     TR Peak grad:   17.5 mmHg MV Area VTI:   3.08 cm     TR Vmax:        209.00 cm/s MV Peak grad:  4.2 mmHg MV Mean grad:  2.0 mmHg     SHUNTS MV Vmax:       1.03 m/s     Systemic VTI:  0.30 m MV Vmean:      65.6 cm/s    Systemic Diam: 2.00 cm MV Decel Time: 231 msec MV E velocity: 110.00 cm/s MV A velocity: 96.80 cm/s MV E/A ratio:  1.14 Buford Dresser MD Electronically signed by Buford Dresser MD Signature Date/Time: 11/10/2022/8:46:54 PM    Final    DG Chest Port 1 View  Result Date: 11/10/2022 CLINICAL DATA:  Chest pain EXAM: PORTABLE CHEST 1 VIEW COMPARISON:  October 28, 2017 FINDINGS: Haziness over the lateral left lung base may be positional/due to overlapping soft tissues. The heart, hila, mediastinum, lungs, and pleura are otherwise unremarkable. IMPRESSION: Haziness over the lateral left lung base may be positional/due to overlapping soft tissues. A PA and lateral chest x-ray may better evaluate if clinically warranted. No other abnormalities. Electronically Signed   By: Dorise Bullion III M.D.   On: 11/10/2022 08:46    Cardiac Studies   Echo 11/10/22  1. Left ventricular ejection fraction, by estimation, is 45 to 50%. The  left ventricle has mildly decreased function. The left ventricle  demonstrates regional wall motion abnormalities  (see scoring  diagram/findings for description). Left ventricular  diastolic parameters are indeterminate.   2. Right ventricular systolic function is normal. The right ventricular  size is normal.   3. Left atrial size was mildly dilated.   4. The mitral valve is normal in structure. Trivial mitral valve  regurgitation. No evidence of mitral stenosis.   5. The aortic valve is grossly normal. Aortic valve regurgitation is not  visualized. No aortic stenosis is present.   Comparison(s): Changes from prior study are noted. Wall motion worse in  anterior wall compared to prior.   Conclusion(s)/Recommendation(s): No left ventricular mural or apical  thrombus/thrombi.   Patient Profile     57 y.o. male with a hx of CAD, NSTEMI, with transient reduction on LV function, to 35-40% though with recovery to 50-55% after PCI and stent of LAD with 38 mm long Synergy DES May 2016, HLD, obesity, sleep apnea, anxiety disorder, seizure disorder who is admitted with NSTEMI.  Assessment & Plan    NSTEMI CAD with prior PCI -hsTn trending up, max thus far 4401 -currently chest pain free -echo as above, anterior wall motion worse than prior, EF 45-50% -continue aspirin, heparin -continue carvedilol -counseled on tobacco cessation, continue to address -nitro ordered -continue rosuvastatin, consider pcsk9i as outpatient -phase I cardiac rehab -plan for cath 1/8, see consent 1/6   Prior ischemic cardiomyopathy with recovered EF -echo as above, anterior  wall motion worse than prior, EF 45-50% -on carvedilol -was on lisinopril prior to admission. BP has been soft, holding lisinopril for now   Hypercholesterolemia -recheck lipids show Tchol 136, TG 120, HDL 41, LDL 71 -pending lp(a) -continue rosuvastatin -consider pcsk9i as outpatient   Hypertension -renal function stable, but BP has been soft. Will hold lisinopril for now  Seizure disorder -continue lamotrigine, levetiracetam,  perampanel  He had seen Dr. Tamala Julian in the past, but he would like to see me at [facility] after discharge.  For questions or updates, please contact [place] Please consult [email or web address] for contact info under        Signed, Buford Dresser, MD  11/11/2022, 12:04 PM

## 2022-11-12 ENCOUNTER — Encounter (HOSPITAL_COMMUNITY): Payer: Self-pay | Admitting: Cardiovascular Disease

## 2022-11-12 ENCOUNTER — Encounter (HOSPITAL_COMMUNITY)
Admission: EM | Disposition: A | Discharge status: Rehabilitation Facility | Payer: Self-pay | Source: Home / Self Care | Attending: Thoracic Surgery (Cardiothoracic Vascular Surgery)

## 2022-11-12 ENCOUNTER — Inpatient Hospital Stay (HOSPITAL_COMMUNITY): Payer: Medicare PPO

## 2022-11-12 DIAGNOSIS — I2510 Atherosclerotic heart disease of native coronary artery without angina pectoris: Secondary | ICD-10-CM

## 2022-11-12 DIAGNOSIS — Z01810 Encounter for preprocedural cardiovascular examination: Secondary | ICD-10-CM

## 2022-11-12 DIAGNOSIS — I214 Non-ST elevation (NSTEMI) myocardial infarction: Secondary | ICD-10-CM | POA: Diagnosis not present

## 2022-11-12 HISTORY — PX: LEFT HEART CATH AND CORONARY ANGIOGRAPHY: CATH118249

## 2022-11-12 LAB — PULMONARY FUNCTION TEST
FEF 25-75 Pre: 2.51 L/sec
FEF2575-%Pred-Pre: 76 %
FEV1-%Pred-Pre: 70 %
FEV1-Pre: 2.77 L
FEV1FVC-%Pred-Pre: 102 %
FEV6-%Pred-Pre: 71 %
FEV6-Pre: 3.55 L
FEV6FVC-%Pred-Pre: 104 %
FVC-%Pred-Pre: 68 %
FVC-Pre: 3.55 L
Pre FEV1/FVC ratio: 78 %
Pre FEV6/FVC Ratio: 100 %

## 2022-11-12 LAB — URINALYSIS, ROUTINE W REFLEX MICROSCOPIC
Bilirubin Urine: NEGATIVE
Glucose, UA: NEGATIVE mg/dL
Hgb urine dipstick: NEGATIVE
Ketones, ur: NEGATIVE mg/dL
Leukocytes,Ua: NEGATIVE
Nitrite: NEGATIVE
Protein, ur: NEGATIVE mg/dL
Specific Gravity, Urine: 1.014 (ref 1.005–1.030)
pH: 6 (ref 5.0–8.0)

## 2022-11-12 LAB — BASIC METABOLIC PANEL
Anion gap: 9 (ref 5–15)
BUN: 11 mg/dL (ref 6–20)
CO2: 26 mmol/L (ref 22–32)
Calcium: 8.8 mg/dL — ABNORMAL LOW (ref 8.9–10.3)
Chloride: 101 mmol/L (ref 98–111)
Creatinine, Ser: 1.02 mg/dL (ref 0.61–1.24)
GFR, Estimated: >60 mL/min (ref >60)
Glucose, Bld: 100 mg/dL — ABNORMAL HIGH (ref 70–99)
Potassium: 3.6 mmol/L (ref 3.5–5.1)
Sodium: 136 mmol/L (ref 135–145)

## 2022-11-12 LAB — CBC
HCT: 44.4 % (ref 39.0–52.0)
Hemoglobin: 14.4 g/dL (ref 13.0–17.0)
MCH: 30.8 pg (ref 26.0–34.0)
MCHC: 32.4 g/dL (ref 30.0–36.0)
MCV: 94.9 fL (ref 80.0–100.0)
Platelets: 164 10*3/uL (ref 150–400)
RBC: 4.68 MIL/uL (ref 4.22–5.81)
RDW: 14.5 % (ref 11.5–15.5)
WBC: 7.3 10*3/uL (ref 4.0–10.5)
nRBC: 0 % (ref 0.0–0.2)

## 2022-11-12 LAB — TYPE AND SCREEN
ABO/RH(D): O POS
Antibody Screen: NEGATIVE

## 2022-11-12 LAB — HEPARIN LEVEL (UNFRACTIONATED): Heparin Unfractionated: 0.69 IU/mL (ref 0.30–0.70)

## 2022-11-12 LAB — SURGICAL PCR SCREEN
MRSA, PCR: NEGATIVE
Staphylococcus aureus: NEGATIVE

## 2022-11-12 LAB — SARS CORONAVIRUS 2 BY RT PCR: SARS Coronavirus 2 by RT PCR: NEGATIVE

## 2022-11-12 LAB — HEMOGLOBIN A1C
Hgb A1c MFr Bld: 6.1 % — ABNORMAL HIGH (ref 4.8–5.6)
Mean Plasma Glucose: 128 mg/dL

## 2022-11-12 LAB — ABO/RH: ABO/RH(D): O POS

## 2022-11-12 SURGERY — LEFT HEART CATH AND CORONARY ANGIOGRAPHY
Anesthesia: LOCAL

## 2022-11-12 MED ORDER — TRANEXAMIC ACID (OHS) PUMP PRIME SOLUTION
2.0000 mg/kg | INTRAVENOUS | Status: DC
  Filled 2022-11-12: qty 3.46

## 2022-11-12 MED ORDER — IOHEXOL 350 MG/ML SOLN
INTRAVENOUS | Status: DC | PRN
  Administered 2022-11-12: 90 mL

## 2022-11-12 MED ORDER — METOPROLOL TARTRATE 12.5 MG HALF TABLET
12.5000 mg | ORAL_TABLET | Freq: Once | ORAL | Status: AC
  Administered 2022-11-13: 12.5 mg via ORAL
  Filled 2022-11-12: qty 1

## 2022-11-12 MED ORDER — ACETAMINOPHEN 325 MG PO TABS
650.0000 mg | ORAL_TABLET | ORAL | Status: DC | PRN
  Administered 2022-11-13: 650 mg via ORAL
  Filled 2022-11-12: qty 2

## 2022-11-12 MED ORDER — CHLORHEXIDINE GLUCONATE 4 % EX LIQD
CUTANEOUS | Status: AC
  Filled 2022-11-12: qty 15

## 2022-11-12 MED ORDER — CHLORHEXIDINE GLUCONATE 4 % EX LIQD
Freq: Once | CUTANEOUS | Status: AC
  Filled 2022-11-12: qty 15

## 2022-11-12 MED ORDER — LIDOCAINE HCL (PF) 1 % IJ SOLN
INTRAMUSCULAR | Status: AC
  Filled 2022-11-12: qty 30

## 2022-11-12 MED ORDER — HEPARIN SODIUM (PORCINE) 1000 UNIT/ML IJ SOLN
INTRAMUSCULAR | Status: AC
  Filled 2022-11-12: qty 10

## 2022-11-12 MED ORDER — MILRINONE LACTATE IN DEXTROSE 20-5 MG/100ML-% IV SOLN
0.3000 ug/kg/min | INTRAVENOUS | Status: DC
  Filled 2022-11-12: qty 100

## 2022-11-12 MED ORDER — VERAPAMIL HCL 2.5 MG/ML IV SOLN
INTRAVENOUS | Status: AC
  Filled 2022-11-12: qty 2

## 2022-11-12 MED ORDER — CEFAZOLIN SODIUM-DEXTROSE 2-4 GM/100ML-% IV SOLN
2.0000 g | INTRAVENOUS | Status: DC
  Filled 2022-11-12: qty 100

## 2022-11-12 MED ORDER — LIDOCAINE HCL (PF) 1 % IJ SOLN
INTRAMUSCULAR | Status: DC | PRN
  Administered 2022-11-12: 2 mL

## 2022-11-12 MED ORDER — HYDRALAZINE HCL 20 MG/ML IJ SOLN: 10.0000 mg | INTRAMUSCULAR | Status: AC | PRN

## 2022-11-12 MED ORDER — CHLORHEXIDINE GLUCONATE 0.12 % MT SOLN
15.0000 mL | Freq: Once | OROMUCOSAL | Status: AC
  Administered 2022-11-13: 15 mL via OROMUCOSAL
  Filled 2022-11-12: qty 15

## 2022-11-12 MED ORDER — LABETALOL HCL 5 MG/ML IV SOLN: 10.0000 mg | INTRAVENOUS | Status: AC | PRN

## 2022-11-12 MED ORDER — SODIUM CHLORIDE 0.9% FLUSH
3.0000 mL | Freq: Two times a day (BID) | INTRAVENOUS | Status: DC
  Administered 2022-11-12 – 2022-11-13 (×2): 3 mL via INTRAVENOUS

## 2022-11-12 MED ORDER — HEPARIN (PORCINE) IN NACL 1000-0.9 UT/500ML-% IV SOLN
INTRAVENOUS | Status: AC
  Filled 2022-11-12: qty 1000

## 2022-11-12 MED ORDER — PHENYLEPHRINE HCL-NACL 20-0.9 MG/250ML-% IV SOLN
30.0000 ug/min | INTRAVENOUS | Status: AC
  Administered 2022-11-13: 25 ug/min via INTRAVENOUS
  Filled 2022-11-12: qty 250

## 2022-11-12 MED ORDER — NITROGLYCERIN 1 MG/10 ML FOR IR/CATH LAB
INTRA_ARTERIAL | Status: AC
  Filled 2022-11-12: qty 10

## 2022-11-12 MED ORDER — DEXMEDETOMIDINE HCL IN NACL 400 MCG/100ML IV SOLN
0.1000 ug/kg/h | INTRAVENOUS | Status: AC
  Administered 2022-11-13: .7 ug/kg/h via INTRAVENOUS
  Filled 2022-11-12: qty 100

## 2022-11-12 MED ORDER — ONDANSETRON HCL 4 MG/2ML IJ SOLN: 4.0000 mg | Freq: Four times a day (QID) | INTRAMUSCULAR | Status: DC | PRN

## 2022-11-12 MED ORDER — HEPARIN (PORCINE) 25000 UT/250ML-% IV SOLN
1950.0000 [IU]/h | INTRAVENOUS | Status: DC
  Administered 2022-11-12 – 2022-11-13 (×2): 1950 [IU]/h via INTRAVENOUS
  Filled 2022-11-12 (×2): qty 250

## 2022-11-12 MED ORDER — VANCOMYCIN HCL 1500 MG/300ML IV SOLN
1500.0000 mg | INTRAVENOUS | Status: AC
  Administered 2022-11-13: 1500 mg via INTRAVENOUS
  Filled 2022-11-12: qty 300

## 2022-11-12 MED ORDER — POTASSIUM CHLORIDE 2 MEQ/ML IV SOLN
80.0000 meq | INTRAVENOUS | Status: DC
  Filled 2022-11-12: qty 40

## 2022-11-12 MED ORDER — TEMAZEPAM 15 MG PO CAPS
15.0000 mg | ORAL_CAPSULE | Freq: Once | ORAL | Status: AC | PRN
  Administered 2022-11-12: 15 mg via ORAL
  Filled 2022-11-12: qty 1

## 2022-11-12 MED ORDER — BISACODYL 5 MG PO TBEC
5.0000 mg | DELAYED_RELEASE_TABLET | Freq: Once | ORAL | Status: AC
  Administered 2022-11-12: 5 mg via ORAL
  Filled 2022-11-12: qty 1

## 2022-11-12 MED ORDER — ATORVASTATIN CALCIUM 80 MG PO TABS: 80.0000 mg | ORAL_TABLET | Freq: Every day | ORAL | Status: DC

## 2022-11-12 MED ORDER — TRANEXAMIC ACID 1000 MG/10ML IV SOLN
1.5000 mg/kg/h | INTRAVENOUS | Status: AC
  Administered 2022-11-13: 1.5 mg/kg/h via INTRAVENOUS
  Filled 2022-11-12 (×3): qty 25

## 2022-11-12 MED ORDER — INSULIN REGULAR(HUMAN) IN NACL 100-0.9 UT/100ML-% IV SOLN
INTRAVENOUS | Status: AC
  Administered 2022-11-13: .6 [IU]/h via INTRAVENOUS
  Filled 2022-11-12: qty 100

## 2022-11-12 MED ORDER — CEFAZOLIN IN SODIUM CHLORIDE 3-0.9 GM/100ML-% IV SOLN
3.0000 g | INTRAVENOUS | Status: AC
  Administered 2022-11-13: 3 g via INTRAVENOUS
  Filled 2022-11-12: qty 100

## 2022-11-12 MED ORDER — HEPARIN SODIUM (PORCINE) 1000 UNIT/ML IJ SOLN
INTRAMUSCULAR | Status: DC | PRN
  Administered 2022-11-12: 8000 [IU] via INTRAVENOUS

## 2022-11-12 MED ORDER — CHLORHEXIDINE GLUCONATE CLOTH 2 % EX PADS
6.0000 | MEDICATED_PAD | Freq: Once | CUTANEOUS | Status: AC
  Administered 2022-11-13: 6 via TOPICAL

## 2022-11-12 MED ORDER — HEPARIN 30,000 UNITS/1000 ML (OHS) CELLSAVER SOLUTION
Status: DC
  Filled 2022-11-12: qty 1000

## 2022-11-12 MED ORDER — NOREPINEPHRINE 4 MG/250ML-% IV SOLN
0.0000 ug/min | INTRAVENOUS | Status: DC
  Filled 2022-11-12: qty 250

## 2022-11-12 MED ORDER — TRANEXAMIC ACID (OHS) BOLUS VIA INFUSION
15.0000 mg/kg | INTRAVENOUS | Status: AC
  Administered 2022-11-13: 2592 mg via INTRAVENOUS
  Filled 2022-11-12: qty 2592

## 2022-11-12 MED ORDER — SODIUM CHLORIDE 0.9 % IV SOLN: 250.0000 mL | INTRAVENOUS | Status: DC | PRN

## 2022-11-12 MED ORDER — MANNITOL 20 % IV SOLN
INTRAVENOUS | Status: DC
  Filled 2022-11-12: qty 13

## 2022-11-12 MED ORDER — PLASMA-LYTE A IV SOLN
INTRAVENOUS | Status: DC
  Filled 2022-11-12: qty 2.5

## 2022-11-12 MED ORDER — MORPHINE SULFATE (PF) 2 MG/ML IV SOLN: 2.0000 mg | INTRAVENOUS | Status: DC | PRN

## 2022-11-12 MED ORDER — ASPIRIN 81 MG PO CHEW: 81.0000 mg | CHEWABLE_TABLET | Freq: Every day | ORAL | Status: DC

## 2022-11-12 MED ORDER — HEPARIN (PORCINE) IN NACL 1000-0.9 UT/500ML-% IV SOLN
INTRAVENOUS | Status: DC | PRN
  Administered 2022-11-12 (×2): 500 mL

## 2022-11-12 MED ORDER — SODIUM CHLORIDE 0.9 % IV SOLN: INTRAVENOUS | Status: AC

## 2022-11-12 MED ORDER — CHLORHEXIDINE GLUCONATE CLOTH 2 % EX PADS
6.0000 | MEDICATED_PAD | Freq: Once | CUTANEOUS | Status: AC
  Administered 2022-11-12: 6 via TOPICAL

## 2022-11-12 MED ORDER — VERAPAMIL HCL 2.5 MG/ML IV SOLN
INTRA_ARTERIAL | Status: DC | PRN
  Administered 2022-11-12: 15 mL via INTRA_ARTERIAL

## 2022-11-12 MED ORDER — SODIUM CHLORIDE 0.9% FLUSH: 3.0000 mL | INTRAVENOUS | Status: DC | PRN

## 2022-11-12 MED ORDER — EPINEPHRINE HCL 5 MG/250ML IV SOLN IN NS
0.0000 ug/min | INTRAVENOUS | Status: DC
  Filled 2022-11-12: qty 250

## 2022-11-12 SURGICAL SUPPLY — 13 items
CATH INFINITI 5 FR JL3.5 (CATHETERS) IMPLANT
CATH INFINITI JR4 5F (CATHETERS) IMPLANT
CATH OPTITORQUE TIG 4.0 5F (CATHETERS) IMPLANT
DEVICE RAD COMP TR BAND LRG (VASCULAR PRODUCTS) IMPLANT
GLIDESHEATH SLEND A-KIT 6F 22G (SHEATH) IMPLANT
GUIDEWIRE INQWIRE 1.5J.035X260 (WIRE) IMPLANT
INQWIRE 1.5J .035X260CM (WIRE) ×1
KIT HEART LEFT (KITS) ×2 IMPLANT
PACK CARDIAC CATHETERIZATION (CUSTOM PROCEDURE TRAY) ×2 IMPLANT
SYR MEDRAD MARK 7 150ML (SYRINGE) ×2 IMPLANT
TRANSDUCER W/STOPCOCK (MISCELLANEOUS) ×2 IMPLANT
TUBING CIL FLEX 10 FLL-RA (TUBING) ×2 IMPLANT
WIRE HI TORQ VERSACORE-J 145CM (WIRE) IMPLANT

## 2022-11-12 NOTE — Anesthesia Preprocedure Evaluation (Signed)
Anesthesia Evaluation  Patient identified by MRN, date of birth, ID band Patient awake    Reviewed: Allergy & Precautions, NPO status , Patient's Chart, lab work & pertinent test results  History of Anesthesia Complications Negative for: history of anesthetic complications  Airway Mallampati: III  TM Distance: >3 FB Neck ROM: Full    Dental no notable dental hx. (+) Dental Advisory Given   Pulmonary Current Smoker and Patient abstained from smoking.   Pulmonary exam normal        Cardiovascular + CAD, + Past MI and + Cardiac Stents  Normal cardiovascular exam  Cath Coronary Findings   Diagnostic Dominance: Right Left Anterior Descending Ost LAD to Prox LAD lesion is 99% stenosed. Vessel is the culprit lesion. The lesion is type C, located at the major branch and ulcerative. The lesion was not previously treated . Mid LAD lesion is 20% stenosed. The lesion was previously treated .  Right Coronary Artery Ost RCA to Prox RCA lesion is 50% stenosed. Vessel is not the culprit lesion.  TTE IMPRESSIONS     1. Left ventricular ejection fraction, by estimation, is 45 to 50%. The  left ventricle has mildly decreased function. The left ventricle  demonstrates regional wall motion abnormalities (see scoring  diagram/findings for description). Left ventricular  diastolic parameters are indeterminate.   2. Right ventricular systolic function is normal. The right ventricular  size is normal.   3. Left atrial size was mildly dilated.   4. The mitral valve is normal in structure. Trivial mitral valve  regurgitation. No evidence of mitral stenosis.   5. The aortic valve is grossly normal. Aortic valve regurgitation is not  visualized. No aortic stenosis is present.   Comparison(s): Changes from prior study are noted. Wall motion worse in  anterior wall compared to prior.        Neuro/Psych Seizures -, Well Controlled,   PSYCHIATRIC DISORDERS Anxiety Depression       GI/Hepatic Neg liver ROS,GERD  ,,  Endo/Other    Morbid obesity  Renal/GU negative Renal ROS     Musculoskeletal negative musculoskeletal ROS (+)    Abdominal   Peds  Hematology negative hematology ROS (+)   Anesthesia Other Findings   Reproductive/Obstetrics                             Anesthesia Physical Anesthesia Plan  ASA: 4  Anesthesia Plan: General   Post-op Pain Management: Tylenol PO (pre-op)*   Induction: Intravenous  PONV Risk Score and Plan: 2 and Ondansetron and Dexamethasone  Airway Management Planned: Oral ETT  Additional Equipment: Arterial line, 3D TEE, Ultrasound Guidance Line Placement and CVP  Intra-op Plan:   Post-operative Plan: Post-operative intubation/ventilation  Informed Consent: I have reviewed the patients History and Physical, chart, labs and discussed the procedure including the risks, benefits and alternatives for the proposed anesthesia with the patient or authorized representative who has indicated his/her understanding and acceptance.     Dental advisory given  Plan Discussed with: Anesthesiologist, CRNA and Surgeon  Anesthesia Plan Comments:        Anesthesia Quick Evaluation

## 2022-11-12 NOTE — Interval H&P Note (Signed)
Cath Lab Visit (complete for each Cath Lab visit)  Clinical Evaluation Leading to the Procedure:   ACS: Yes.    Non-ACS:    Anginal Classification: CCS II  Anti-ischemic medical therapy: Maximal Therapy (2 or more classes of medications)  Non-Invasive Test Results: No non-invasive testing performed  Prior CABG: No previous CABG      History and Physical Interval Note:  11/12/2022 8:30 AM  James Torres  has presented today for surgery, with the diagnosis of NSTEMI.  The various methods of treatment have been discussed with the patient and family. After consideration of risks, benefits and other options for treatment, the patient has consented to  Procedure(s): LEFT HEART CATH AND CORONARY ANGIOGRAPHY (N/A) as a surgical intervention.  The patient's history has been reviewed, patient examined, no change in status, stable for surgery.  I have reviewed the patient's chart and labs.  Questions were answered to the patient's satisfaction.     Quay Burow

## 2022-11-12 NOTE — TOC Progression Note (Signed)
Transition of Care [facility]) - Progression Note    Patient Details  Name: James Torres MRN: [id number] Date of Birth: 30-Jun-1966  [facility] CM/SW Contact  Zenon Mayo, RN Phone Number: 11/12/2022, 8:08 PM  Clinical Narrative:    from home with wife, indep, NSTEMI , for cath today, TCT consult, plan for CABG.  TOC following.         Expected Discharge Plan and Services                                               Social Determinants of Health (SDOH) Interventions SDOH Screenings   Tobacco Use: High Risk (11/12/2022)    Readmission Risk Interventions     No data to display

## 2022-11-12 NOTE — Consult Note (Addendum)
[address]       [place] [address]             [id number]        James Torres [facility] Medical Record #[id number] Date of Birth: 1966/04/22  Referring:  Lorretta Harp, MD  Primary Care: Lawerance Cruel, MD Primary Cardiologist:Bridgette  Gave, MD  Reason for consult: Evaluation for surgical management of severe single-vessel coronary artery disease   History of Present Illness:    [name] is a 57 year old gentleman with a past history of coronary artery disease having undergone PTCI of the LAD in 2016.  This was after presenting with myocardial infarction.  He has a history of ischemic cardiomyopathy with EF of 30 to 35% and subsequent recovery EF of 45 to 50%.  He also has a past history of dyslipidemia, hypertension, and status post gamma knife therapy for a benign epidermoid right cyst in 2019.  He has a seizure disorder and is managed with 3 antiseizure medications. James Torres was brought to the emergency room by EMS on 11/10/2022 after onset of chest pain at home.  He took 2 nitroglycerin sublingually with relief of symptoms.  In the emergency room, EKG showed mild ST segment depressions inferiorly and nonspecific ST-T wave changes inferior leads.  Initial troponin was 177 and later rose to a peak of 4400.  Chest x-ray showed some minor haziness in the left lateral lung base but was otherwise unremarkable. Chest pain had resolved in the emergency room. He was treated with standard therapy for acute non-ST elevation myocardial infarction including aspirin, beta-blocker, statin, and IV heparin he was admitted to the hospital.  Left heart catheterization earlier today demonstrated a discrete 99% stenosis of the proximal LAD.  The LAD stent has some irregularities with maximal  20% stenosis.  The CFX and RCA had no occlusive disease.  Dr. Gwenlyn Found did not feel this very proximal LAD stenosis occurring after the bifurcation of the left main coronary  was suitable for PCI. Dr. Gwenlyn Found asked that James Torres be considered for coronary bypass grafting with left internal mammary artery to the left anterior descending coronary  artery.  Currently, James Torres is resting in bed with a friend at the bedside.  He denies pain since his initial presentation to the ED. He has been declared disabled since his brain surgery in 2019 to resect a benign epidermoid cyst.  He said he has fairly frequent petit mal seizures subsequent to the brain surgery. His mobility is limited by left hip arthritis.  He has been told he needs hip replacement surgery but was encouraged to loose weight before doing so,   Current Activity/ Functional Status: Patient is independent with mobility/ambulation, transfers, ADL's, IADL's.   Zubrod Score: At the time of surgery this patient's most appropriate activity status/level should be described as: '[x]'$     0    Normal activity, no symptoms '[]'$     1    Restricted in physical strenuous activity but ambulatory, able to do out light work '[]'$     2    Ambulatory and capable of self care, unable to do work activities, up and about                 more than 50%  Of the time                            '[]'$   3    Only limited self care, in bed greater than 50% of waking hours '[]'$     4    Completely disabled, no self care, confined to bed or chair '[]'$     5    Moribund  Past Medical History:  Diagnosis Date   Anxiety    Brain tumor ([place])    CAD (coronary artery disease)    a. 03/2015 NSTEMI: LM nl, LAD 50ost, 100p (2.75x38 Synergy DES), 75d, RI nl, LCX minor irregs, OM1 min irregs, RCA 50ost, EF 35-45%.   Depression    GERD (gastroesophageal reflux disease)    Hyperlipidemia    Ischemic cardiomyopathy    a. 03/2015 EF 35-45% by LV gram @ time of NSTEMI;  b. 03/2015 Echo: EF 50-55%, sev HK to DK of dist an, apical, and infap walls.Gr 1 DD.   Kidney stone    Metabolic syndrome 3/32/9518   Migraines    Morbid obesity ([place])    Pre-diabetes     Seizures ([place])    a. Has auras and metalic taste in mouth.  Last one 06/2014- last a few seconds, has one every couple weeks, Dr Mare Loan is neurologist and is aware.    Sleep apnea 2009   a. On CPAP.   Tobacco abuse     Past Surgical History:  Procedure Laterality Date   BRAIN SURGERY  2004   Crainotomy for tumor   CARDIAC CATHETERIZATION N/A 03/21/2015   Procedure: Left Heart Cath and Coronary Angiography;  Surgeon: Sherren Mocha, MD;  Location: [facility];  Service: Cardiovascular;  Laterality: N/A;   KNEE ARTHROSCOPY Right 2005 approx   cartliage   ULNAR NERVE TRANSPOSITION Left 06/25/2014   Procedure: LEFT ULNAR NEUROPLASTY AT ELBOW;  Surgeon: Jolyn Nap, MD;  Location: [facility];  Service: Orthopedics;  Laterality: Left;   WISDOM TOOTH EXTRACTION      Social History   Tobacco Use  Smoking Status Every Day   Packs/day: 0.50   Years: 20.00   Total pack years: 10.00   Types: Cigarettes  Smokeless Tobacco Never    Social History   Substance and Sexual Activity  Alcohol Use No   Alcohol/week: 0.0 standard drinks of alcohol     Allergies  Allergen Reactions   Zithromax [Azithromycin] Hives    Current Facility-Administered Medications  Medication Dose Route Frequency Provider Last Rate Last Admin   0.9 %  sodium chloride infusion   Intravenous Continuous Lorretta Harp, MD   Held at 11/10/22 1347   0.9 %  sodium chloride infusion   Intravenous Continuous Lorretta Harp, MD   Stopped at 11/12/22 1032   0.9 %  sodium chloride infusion  250 mL Intravenous PRN Lorretta Harp, MD       acetaminophen (TYLENOL) tablet 650 mg  650 mg Oral Q4H PRN Lorretta Harp, MD       aspirin EC tablet 81 mg  81 mg Oral Daily Lorretta Harp, MD   81 mg at 11/11/22 0812   carvedilol (COREG) tablet 6.25 mg  6.25 mg Oral BID WC Lorretta Harp, MD   6.25 mg at 11/12/22 0949   citalopram (CELEXA) tablet 40 mg  40 mg Oral q morning Lorretta Harp, MD   40 mg at  11/12/22 0948   clonazePAM (KLONOPIN) tablet 0.5 mg  0.5 mg Oral BID Lorretta Harp, MD   0.5 mg at 11/12/22 0956   hydrALAZINE (APRESOLINE) injection 10 mg  10 mg Intravenous Q20 Min PRN Lorretta Harp, MD       labetalol (NORMODYNE) injection 10 mg  10 mg Intravenous Q10 min PRN Lorretta Harp, MD       lamoTRIgine (LAMICTAL) tablet 200 mg  200 mg Oral Daily Lorretta Harp, MD   200 mg at 11/12/22 0949   levETIRAcetam (KEPPRA XR) 24 hr tablet 1,000 mg  1,000 mg Oral BID Lorretta Harp, MD   1,000 mg at 11/12/22 0949   morphine (PF) 2 MG/ML injection 2 mg  2 mg Intravenous Q1H PRN Lorretta Harp, MD       nitroGLYCERIN (NITROGLYN) 2 % ointment 1 inch  1 inch Topical Q6H PRN Lorretta Harp, MD       nitroGLYCERIN (NITROSTAT) SL tablet 0.4 mg  0.4 mg Sublingual Q5 min PRN Lorretta Harp, MD       ondansetron [facility]) injection 4 mg  4 mg Intravenous Q6H PRN Lorretta Harp, MD       pantoprazole (PROTONIX) EC tablet 80 mg  80 mg Oral Daily Lorretta Harp, MD   80 mg at 11/12/22 0956   perampanel (FYCOMPA) tablet 8 mg  8 mg Oral QHS Lorretta Harp, MD   8 mg at 11/12/22 0151   rosuvastatin (CRESTOR) tablet 40 mg  40 mg Oral QHS Lorretta Harp, MD   40 mg at 11/11/22 2200   sodium chloride flush (NS) 0.9 % injection 3 mL  3 mL Intravenous Q12H Lorretta Harp, MD   3 mL at 11/11/22 2202   sodium chloride flush (NS) 0.9 % injection 3 mL  3 mL Intravenous Q12H Lorretta Harp, MD       sodium chloride flush (NS) 0.9 % injection 3 mL  3 mL Intravenous PRN Lorretta Harp, MD        Medications Prior to Admission  Medication Sig Dispense Refill Last Dose   acetaminophen (TYLENOL) 500 MG tablet Take 1,000 mg by mouth every 6 (six) hours as needed for mild pain.   11/08/2022   aspirin EC 81 MG EC tablet Take 1 tablet (81 mg total) by mouth daily.   11/10/2022   CALCIUM CARBONATE-VITAMIN D PO Take 1 tablet by mouth daily with breakfast.   11/10/2022   carvedilol  (COREG) 6.25 MG tablet Take 1 tablet by mouth in the morning and at bedtime. (Patient taking differently: Take 6.25 mg by mouth 2 (two) times daily with a meal.) 180 tablet 3 11/10/2022 at 0730   Cholecalciferol (VITAMIN D) 125 MCG (5000 UT) CAPS Take 5,000 Units by mouth daily.   11/10/2022   citalopram (CELEXA) 40 MG tablet Take 40 mg by mouth every morning.   11/10/2022   clonazePAM (KLONOPIN) 0.5 MG tablet Take 0.25-0.5 mg by mouth 2 (two) times daily.   11/10/2022   famotidine (PEPCID) 20 MG tablet TAKE 1 TABLET BY MOUTH EVERY DAY (Patient taking differently: Take 20 mg by mouth at bedtime.) 90 tablet 2 11/09/2022   Fremanezumab-vfrm (AJOVY) 225 MG/1.5ML SOAJ Inject 1 Dose into the skin every 30 (thirty) days.   10/16/2022   ibuprofen (ADVIL) 600 MG tablet Take 600 mg by mouth 2 (two) times daily as needed for mild pain.   11/07/2022   lamoTRIgine (LAMICTAL) 100 MG tablet Take 200 mg by mouth in the morning and at bedtime.   11/10/2022   levETIRAcetam (KEPPRA XR) 500 MG 24 hr tablet Take 1,000 mg by mouth 2 (two)  times daily.   11/10/2022   lisinopril (ZESTRIL) 2.5 MG tablet Take 1 tablet (2.5 mg total) by mouth daily. 90 tablet 2 11/10/2022   Melatonin 5 MG CHEW Chew 15-20 mg by mouth at bedtime.   11/09/2022   nitroGLYCERIN (NITROSTAT) 0.4 MG SL tablet Place 1 tablet (0.4 mg total) under the tongue every 5 (five) minutes as needed for chest pain (x 3 pills daily). 25 tablet 6 11/10/2022   oxyCODONE-acetaminophen (ROXICET) 5-325 MG per tablet Take 1-2 tablets by mouth every 4 (four) hours as needed for severe pain. 40 tablet 0 Past Week   pantoprazole (PROTONIX) 40 MG tablet Take 2 tablets (80 mg total) by mouth daily. 180 tablet 3 11/10/2022   perampanel (FYCOMPA) 8 MG tablet Take 1 tablet by mouth daily.   11/09/2022   rosuvastatin (CRESTOR) 40 MG tablet Take 1 tablet (40 mg total) by mouth daily. 90 tablet 3 11/09/2022   Ubrogepant (UBRELVY) 100 MG TABS Take 1-2 tablets by mouth daily as needed (HEADACHE).   unk     Family History  Problem Relation Age of Onset   Healthy Mother    Heart disease Father    Stroke Father    Healthy Sister    Healthy Sister    Heart attack Neg Hx      Review of Systems:       Cardiac Review of Systems: Y or  [    ]= no  Chest Pain [  x  ]  Resting SOB [   ] Exertional SOB  [  ]  Orthopnea [  ]   Pedal Edema [   ]    Palpitations [  ] Syncope  [  ]   Presyncope [   ]  General Review of Systems: [Y] = yes [  ]=no Constitional: recent weight change [  ]; anorexia [  ]; fatigue [  ]; nausea [  ]; night sweats [  ]; fever [  ]; or chills [  ]                                                               Dental: Last Dentist visit: 2 years ago  Eye : blurred vision [  ]; diplopia [   ]; vision changes [  ];  Amaurosis fugax[  ]; Resp: cough [  ];  wheezing[  ];  hemoptysis[  ]; shortness of breath[  ]; paroxysmal nocturnal dyspnea[  ]; dyspnea on exertion[  ]; or orthopnea[  ];  GI:  gallstones[  ], vomiting[  ];  dysphagia[  ]; melena[  ];  hematochezia [  ]; heartburn[  ];   Hx of  Colonoscopy[  ]; GU: kidney stones [  ]; hematuria[  ];   dysuria [  ];  nocturia[  ];  history of     obstruction [  ]; urinary frequency [  ]             Skin: rash, swelling[  ];, hair loss[  ];  peripheral edema[  ];  or itching[  ]; Musculosketetal: myalgias[  ];  joint swelling[  ];  joint erythema[  ];  joint pain[  ];  back pain[  ];  Heme/Lymph: bruising[  ];  bleeding[  ];  anemia[  ];  Neuro: TIA[  ];  headaches[  ];  stroke[  ];  vertigo[  ];  seizures[ x ];   paresthesias[  ];  difficulty walking[  ];  Psych:depression[  ]; anxiety[  ];  Endocrine: pre-diabetes[ x ];  thyroid dysfunction[  ];                Physical Exam: BP 113/64 (BP Location: Left Arm)   Pulse 78   Temp 98.3 F (36.8 C) (Oral)   Resp 14   Ht 6' (1.829 m)   Wt (!) 172.8 kg   SpO2 96%   BMI 51.67 kg/m    General appearance: alert, cooperative, and no distress Head: Normocephalic, without  obvious abnormality, atraumatic Neck: no adenopathy, no carotid bruit, no JVD, and supple, symmetrical, trachea midline Lymph nodes: No cervical or clavicular adenopathy Resp: Breath sounds are full, equal, and clear to auscultation Cardio: Regular rate and rhythm without murmur GI: Obese, soft, no tenderness.  Active bowel sounds. Extremities: No obvious deformities.  All are well-perfused.  There is a TR band in place over the right wrist.  No evidence of varicosities in the lower extremities Neurologic: Grossly normal  Diagnostic Studies & Laboratory data:     Recent Radiology Findings:   ECHOCARDIOGRAM REPORT       Patient Name:   JERREN FLINCHBAUGH [facility] Date of Exam: 11/10/2022  Medical Rec #:  [id number]       Height:       72.0 in  Accession #:    [id number]      Weight:       395.0 lb  Date of Birth:  14-Jun-1966       BSA:          2.845 m  Patient Age:    31 years        BP:           110/65 mmHg  Patient Gender: M               HR:           77 bpm.  Exam Location:  Inpatient   Procedure: 2D Echo, Cardiac Doppler, Color Doppler and Intracardiac             Opacification Agent   Indications:    Chest Pain    History:        Patient has prior history of Echocardiogram examinations,  most                 recent 03/22/2015. CHF, Previous Myocardial Infarction and  CAD,                 Signs/Symptoms:Chest Pain; Risk Factors:Dyslipidemia and  Current                 Smoker.    Sonographer:    Wenda Low  Referring Phys: [place]     1. Left ventricular ejection fraction, by estimation, is 45 to 50%. The  left ventricle has mildly decreased function. The left ventricle  demonstrates regional wall motion abnormalities (see scoring  diagram/findings for description). Left ventricular  diastolic parameters are indeterminate.   2. Right ventricular systolic function is normal. The right ventricular  size is normal.   3. Left atrial size was mildly  dilated.   4. The mitral valve is normal in structure. Trivial mitral valve  regurgitation. No evidence of mitral stenosis.  5. The aortic valve is grossly normal. Aortic valve regurgitation is not  visualized. No aortic stenosis is present.   Comparison(s): Changes from prior study are noted. Wall motion worse in  anterior wall compared to prior.   Conclusion(s)/Recommendation(s): No left ventricular mural or apical  thrombus/thrombi.   FINDINGS   Left Ventricle: Left ventricular ejection fraction, by estimation, is 45  to 50%. The left ventricle has mildly decreased function. The left  ventricle demonstrates regional wall motion abnormalities. Definity  contrast agent was given IV to delineate the  left ventricular endocardial borders. The left ventricular internal cavity  size was normal in size. There is no left ventricular hypertrophy. Left  ventricular diastolic parameters are indeterminate.     LV Wall Scoring:  The entire anterior wall, mid and distal anterior septum, and entire apex  are  hypokinetic. The antero-lateral wall, inferior wall, posterior wall, basal  anteroseptal segment, mid inferoseptal segment, and basal inferoseptal  segment are normal.   Right Ventricle: The right ventricular size is normal. Right vetricular  wall thickness was not well visualized. Right ventricular systolic  function is normal.   Left Atrium: Left atrial size was mildly dilated.   Right Atrium: Right atrial size was normal in size.   Pericardium: There is no evidence of pericardial effusion. Presence of  epicardial fat layer.   Mitral Valve: The mitral valve is normal in structure. Trivial mitral  valve regurgitation. No evidence of mitral valve stenosis. MV peak  gradient, 4.2 mmHg. The mean mitral valve gradient is 2.0 mmHg.   Tricuspid Valve: The tricuspid valve is normal in structure. Tricuspid  valve regurgitation is trivial. No evidence of tricuspid stenosis.   Aortic  Valve: The aortic valve is grossly normal. Aortic valve  regurgitation is not visualized. No aortic stenosis is present. Aortic  valve mean gradient measures 5.0 mmHg. Aortic valve peak gradient measures  9.2 mmHg. Aortic valve area, by VTI measures  3.06 cm.   Pulmonic Valve: The pulmonic valve was not well visualized. Pulmonic valve  regurgitation is not visualized. No evidence of pulmonic stenosis.   Aorta: The aortic root and ascending aorta are structurally normal, with  no evidence of dilitation.   Venous: The inferior vena cava was not well visualized.   IAS/Shunts: The atrial septum is grossly normal.     LEFT VENTRICLE  PLAX 2D  LVIDd:         5.70 cm   Diastology  LVIDs:         4.20 cm   LV e' medial:    8.05 cm/s  LV PW:         1.10 cm   LV E/e' medial:  13.7  LV IVS:        1.20 cm   LV e' lateral:   10.80 cm/s  LVOT diam:     2.00 cm   LV E/e' lateral: 10.2  LV SV:         94  LV SV Index:   33  LVOT Area:     3.14 cm     RIGHT VENTRICLE  RV Basal diam:  3.65 cm  RV Mid diam:    2.80 cm   LEFT ATRIUM             Index        RIGHT ATRIUM           Index  LA diam:        4.60 cm 1.62 cm/m  RA Area:     18.30 cm  LA Vol (A2C):   76.5 ml 26.89 ml/m  RA Volume:   51.80 ml  18.21 ml/m  LA Vol (A4C):   66.8 ml 23.48 ml/m  LA Biplane Vol: 74.9 ml 26.33 ml/m   AORTIC VALVE  AV Area (Vmax):    2.50 cm  AV Area (Vmean):   2.42 cm  AV Area (VTI):     3.06 cm  AV Vmax:           152.00 cm/s  AV Vmean:          110.000 cm/s  AV VTI:            0.308 m  AV Peak Grad:      9.2 mmHg  AV Mean Grad:      5.0 mmHg  LVOT Vmax:         121.00 cm/s  LVOT Vmean:        84.600 cm/s  LVOT VTI:          0.300 m  LVOT/AV VTI ratio: 0.97    AORTA  Ao Root diam: 3.90 cm  Ao Asc diam:  2.80 cm   MITRAL VALVE                TRICUSPID VALVE  MV Area (PHT): 3.28 cm     TR Peak grad:   17.5 mmHg  MV Area VTI:   3.08 cm     TR Vmax:        209.00 cm/s  MV Peak  grad:  4.2 mmHg  MV Mean grad:  2.0 mmHg     SHUNTS  MV Vmax:       1.03 m/s     Systemic VTI:  0.30 m  MV Vmean:      65.6 cm/s    Systemic Diam: 2.00 cm  MV Decel Time: 231 msec  MV E velocity: 110.00 cm/s  MV A velocity: 96.80 cm/s  MV E/A ratio:  1.14   Buford Dresser MD  Electronically signed by Buford Dresser MD  Signature Date/Time: 11/10/2022/8:46:54 PM        LEFT HEART CATH AND CORONARY ANGIOGRAPHY   Conclusion      Ost LAD to Prox LAD lesion is 99% stenosed.   Mid LAD lesion is 20% stenosed.   Ost RCA to Prox RCA lesion is 50% stenosed.   MELECIO CUETO is a 57 y.o. male      [id number] LOCATION:  FACILITY: [facility]  PHYSICIAN: Quay Burow, M.D. 05-Feb-1966     DATE OF PROCEDURE:  11/12/2022   DATE OF DISCHARGE:        CARDIAC CATHETERIZATION        History obtained from chart review.57 y.o. male with a hx of CAD, NSTEMI, with transient reduction on LV function, to 35-40% though with recovery to 50-55% after PCI and stent of LAD with 38 mm long Synergy DES May 2016, HLD, obesity, sleep apnea, anxiety disorder, seizure disorder who is admitted with NSTEM      PROCEDURE DESCRIPTION:    The patient was brought to the second floor [facility] cath lab in the postabsorptive state. He was not premedicated . His right wrist was prepped and shaved in usual sterile fashion. Xylocaine 1% was used for local anesthesia. A 6 French sheath was inserted into the right radial artery using standard Seldinger technique. The patient received 8000 units  of heparin intravenously.  A 5 [place]  TIG catheter, right left Judkins catheters were used for selective coronary angiography and obtain left heart pressures.  Isovue dye was used for the entirety of the case (90 cc of contrast total to patient).  Retrograde aortic, left ventricular and pullback pressures were recorded.  LVEDP was 25 mmHg.  Radial cocktail was administered via the SideArm sheath.        IMPRESSION:Mr Viviano was admitted with a non-STEMI with troponins in the 2500 range.  His EF is 40 to 45% with a wall motion abnormality in the LAD distribution.  He is previously placed stent in his mid LAD is patent.  He has had progression of his ostial/proximal LAD which was 50 to 60% back in 2016 and now is 99% with what appears to be a ruptured plaque and all ulceration.  I do not think this is suitable for PCI and stenting given the patient's size and the lesion location just off the left main.  I believe he would be better served with a LIMA graft to his LAD.  He has not been on P2 Y12 inhibitors.  The sheath was removed and a TR band was placed on the right wrist to achieve patent hemostasis.  The patient left lab in stable condition.  Heparin will be restarted in 2 hours.  T CTS will be notified.  Coronary Findings  Diagnostic Dominance: Right Left Anterior Descending  Ost LAD to Prox LAD lesion is 99% stenosed. Vessel is the culprit lesion. The lesion is type C, located at the major branch and ulcerative. The lesion was not previously treated .  Mid LAD lesion is 20% stenosed. The lesion was previously treated .    Right Coronary Artery  Ost RCA to Prox RCA lesion is 50% stenosed. Vessel is not the culprit lesion.    Intervention   No interventions have been documented.   Coronary Diagrams  Diagnostic Dominance: Right      Quay Burow. MD, [facility] 11/12/2022 9:25 AM     I have independently reviewed the above radiologic studies and discussed with the patient   Recent Lab Findings: Lab Results  Component Value Date   WBC 7.3 11/12/2022   HGB 14.4 11/12/2022   HCT 44.4 11/12/2022   PLT 164 11/12/2022   GLUCOSE 100 (H) 11/12/2022   CHOL 136 11/10/2022   TRIG 120 11/10/2022   HDL 41 11/10/2022   LDLCALC 71 11/10/2022   ALT 26 11/10/2022   AST 33 11/10/2022   NA 136 11/12/2022   K 3.6 11/12/2022   CL 101 11/12/2022   CREATININE 1.02 11/12/2022   BUN 11 11/12/2022    CO2 26 11/12/2022   TSH 2.001 11/10/2022   INR 0.9 11/10/2022   HGBA1C 6.1 (H) 03/22/2015      Assessment / Plan:    -CAD-Single-vessel CAD in the proximal LAD in a 57yo gentleman presenting with acute NSTEMI and mild LV dysfunction. Dr. [name] has reviewed the coronary angiography and agrees that coronary bypass grafting is the best option for treating this coronary lesion.    The procedure and expected perioperative course and recovery were discussed with Mr. Dansby and he would like to proceed with surgery.  Dr. [name] will see Mr. Engelken later today and determine timing of surgery.  -History of left temporal epidermoid cyst-status post gamma knife resection  -Seizure disorder-present since gamma knife therapy for the brain lesion.  He reports he has petit mall seizures on a frequent basis.  He takes 3 different medications to  control his seizures.  -Prediabetes  -Severe arthritis left hip limiting mobility  -Obesity  -Prediabetes   I  spent 40 minutes counseling the patient face to face.   Antony Odea, PA-C  11/12/2022 10:33 AM   Agree with above 57yo male with ostial LAD disease.  His heart function is preserved, and he has no significant valvular disease.  We discussed the risks and benefits of surgical revascularization, and he is agreeable to proceed.  Harlyn Rathmann Bary Leriche

## 2022-11-12 NOTE — Progress Notes (Signed)
Rounding Note    Patient Name: James Torres Date of Encounter: 11/12/2022  [place] Cardiologist: Buford Dresser, MD   Subjective   No CP or dyspnea  Inpatient Medications    Scheduled Meds:  aspirin EC  81 mg Oral Daily   carvedilol  6.25 mg Oral BID WC   citalopram  40 mg Oral q morning   clonazePAM  0.5 mg Oral BID   lamoTRIgine  200 mg Oral Daily   levETIRAcetam  1,000 mg Oral BID   pantoprazole  80 mg Oral Daily   perampanel  8 mg Oral QHS   rosuvastatin  40 mg Oral QHS   sodium chloride flush  3 mL Intravenous Q12H   sodium chloride flush  3 mL Intravenous Q12H   Continuous Infusions:  sodium chloride Stopped (11/10/22 1347)   sodium chloride Stopped (11/12/22 1032)   sodium chloride     PRN Meds: sodium chloride, acetaminophen, hydrALAZINE, labetalol, morphine injection, nitroGLYCERIN, nitroGLYCERIN, ondansetron (ZOFRAN) IV, sodium chloride flush   Vital Signs    Vitals:   11/12/22 0858 11/12/22 0903 11/12/22 0908 11/12/22 0914  BP: 120/76 124/73 127/71 113/64  Pulse: 78 82 78   Resp: 13 (!) 8 14   Temp:      TempSrc:      SpO2: 96% 97% 96%   Weight:      Height:        Intake/Output Summary (Last 24 hours) at 11/12/2022 1056 Last data filed at 11/12/2022 1032 Gross per 24 hour  Intake 2197.77 ml  Output 2500 ml  Net -302.23 ml      11/12/2022    4:31 AM 11/10/2022    8:41 AM 03/01/2022    3:37 PM  Last 3 Weights  Weight (lbs) 381 lb 395 lb 397 lb 6.4 oz  Weight (kg) 172.82 kg 179.171 kg 180.259 kg      Telemetry    Sinus - Personally Reviewed   Physical Exam   GEN: No acute distress.   Neck: No JVD Cardiac: RRR, no murmurs, rubs, or gallops.  Respiratory: Clear to auscultation bilaterally. GI: Soft, nontender, non-distended  MS: No edema; TRP band in place Neuro:  Nonfocal  Psych: Normal affect   Labs    High Sensitivity Troponin:   Recent Labs  Lab 11/10/22 0832 11/10/22 1020 11/10/22 1705  11/11/22 0949 11/11/22 1139  TROPONINIHS 177* 969* 4,401* 2,441* 2,566*     Chemistry Recent Labs  Lab 11/10/22 0832 11/10/22 0850 11/10/22 1705 11/11/22 0046 11/12/22 0543  NA 139 141  --  135 136  K 4.3 4.3  --  3.6 3.6  CL 106 106  --  102 101  CO2 22  --   --  25 26  GLUCOSE 173* 169*  --  99 100*  BUN 9 10  --  10 11  CREATININE 1.00 0.90  --  0.89 1.02  CALCIUM 9.0  --   --  9.0 8.8*  MG  --   --  2.0  --   --   PROT 6.6  --   --   --   --   ALBUMIN 3.7  --   --   --   --   AST 33  --   --   --   --   ALT 26  --   --   --   --   ALKPHOS 74  --   --   --   --  BILITOT 0.5  --   --   --   --   GFRNONAA >60  --   --  >60 >60  ANIONGAP 11  --   --  8 9    Lipids  Recent Labs  Lab 11/10/22 0832  CHOL 136  TRIG 120  HDL 41  LDLCALC 71  CHOLHDL 3.3    Hematology Recent Labs  Lab 11/10/22 0832 11/10/22 0850 11/11/22 0046 11/12/22 0543  WBC 6.6  --  8.5 7.3  RBC 4.81  --  4.53 4.68  HGB 15.0 15.3 13.8 14.4  HCT 46.1 45.0 42.5 44.4  MCV 95.8  --  93.8 94.9  MCH 31.2  --  30.5 30.8  MCHC 32.5  --  32.5 32.4  RDW 14.6  --  14.8 14.5  PLT 155  --  159 164   Thyroid  Recent Labs  Lab 11/10/22 1705  TSH 2.001  FREET4 0.95      Radiology    CARDIAC CATHETERIZATION  Result Date: 11/12/2022 Images from the original result were not included.   Ost LAD to Prox LAD lesion is 99% stenosed.   Mid LAD lesion is 20% stenosed.   Ost RCA to Prox RCA lesion is 50% stenosed. James Torres is a 57 y.o. male  [id number] LOCATION:  FACILITY: [facility] PHYSICIAN: Quay Burow, M.D. 57 DATE OF PROCEDURE:  11/12/2022 DATE OF DISCHARGE: CARDIAC CATHETERIZATION History obtained from chart review.57 y.o. male with a hx of CAD, NSTEMI, with transient reduction on LV function, to 35-40% though with recovery to 50-55% after PCI and stent of LAD with 38 mm long Synergy DES May 2016, HLD, obesity, sleep apnea, anxiety disorder, seizure disorder who is admitted with NSTEM  PROCEDURE DESCRIPTION: The patient was brought to the second floor [facility]iac cath lab in the postabsorptive state. He was not premedicated . His right wrist was prepped and shaved in usual sterile fashion. Xylocaine 1% was used for local anesthesia. A 6 French sheath was inserted into the right radial artery using standard Seldinger technique. The patient received 8000 units  of heparin intravenously.  A 5 [place] TIG catheter, right left Judkins catheters were used for selective coronary angiography and obtain left heart pressures.  Isovue dye was used for the entirety of the case (90 cc of contrast total to patient).  Retrograde aortic, left ventricular and pullback pressures were recorded.  LVEDP was 25 mmHg.  Radial cocktail was administered via the SideArm sheath. IMPRESSION:James Torres was admitted with a non-STEMI with troponins in the 2500 range.  His EF is 40 to 45% with a wall motion abnormality in the LAD distribution.  He is previously placed stent in his mid LAD is patent.  He has had progression of his ostial/proximal LAD which was 50 to 60% back in 2016 and now is 99% with what appears to be a ruptured plaque and all ulceration.  I do not think this is suitable for PCI and stenting given the patient's size and the lesion location just off the left main.  I believe he would be better served with a LIMA graft to his LAD.  He has not been on P2 Y12 inhibitors.  The sheath was removed and a TR band was placed on the right wrist to achieve patent hemostasis.  The patient left lab in stable condition.  Heparin will be restarted in 2 hours.  T CTS will be notified. Quay Burow. MD, [facility] 11/12/2022 9:25 AM  ECHOCARDIOGRAM COMPLETE  Result Date: 11/10/2022    ECHOCARDIOGRAM REPORT   Patient Name:   James Torres [facility] Date of Exam: 11/10/2022 Medical Rec #:  [id number]       Height:       72.0 in Accession #:    [id number]      Weight:       395.0 lb Date of Birth:  July 03, 1966       BSA:          2.845 m  Patient Age:    57 years        BP:           110/65 mmHg Patient Gender: M               HR:           77 bpm. Exam Location:  Inpatient Procedure: 2D Echo, Cardiac Doppler, Color Doppler and Intracardiac            Opacification Agent Indications:    Chest Pain  History:        Patient has prior history of Echocardiogram examinations, most                 recent 03/22/2015. CHF, Previous Myocardial Infarction and CAD,                 Signs/Symptoms:Chest Pain; Risk Factors:Dyslipidemia and Current                 Smoker.  Sonographer:    Wenda Low Referring Phys: [place]  1. Left ventricular ejection fraction, by estimation, is 45 to 50%. The left ventricle has mildly decreased function. The left ventricle demonstrates regional wall motion abnormalities (see scoring diagram/findings for description). Left ventricular diastolic parameters are indeterminate.  2. Right ventricular systolic function is normal. The right ventricular size is normal.  3. Left atrial size was mildly dilated.  4. The mitral valve is normal in structure. Trivial mitral valve regurgitation. No evidence of mitral stenosis.  5. The aortic valve is grossly normal. Aortic valve regurgitation is not visualized. No aortic stenosis is present. Comparison(s): Changes from prior study are noted. Wall motion worse in anterior wall compared to prior. Conclusion(s)/Recommendation(s): No left ventricular mural or apical thrombus/thrombi. FINDINGS  Left Ventricle: Left ventricular ejection fraction, by estimation, is 45 to 50%. The left ventricle has mildly decreased function. The left ventricle demonstrates regional wall motion abnormalities. Definity contrast agent was given IV to delineate the left ventricular endocardial borders. The left ventricular internal cavity size was normal in size. There is no left ventricular hypertrophy. Left ventricular diastolic parameters are indeterminate.  LV Wall Scoring: The entire  anterior wall, mid and distal anterior septum, and entire apex are hypokinetic. The antero-lateral wall, inferior wall, posterior wall, basal anteroseptal segment, mid inferoseptal segment, and basal inferoseptal segment are normal. Right Ventricle: The right ventricular size is normal. Right vetricular wall thickness was not well visualized. Right ventricular systolic function is normal. Left Atrium: Left atrial size was mildly dilated. Right Atrium: Right atrial size was normal in size. Pericardium: There is no evidence of pericardial effusion. Presence of epicardial fat layer. Mitral Valve: The mitral valve is normal in structure. Trivial mitral valve regurgitation. No evidence of mitral valve stenosis. MV peak gradient, 4.2 mmHg. The mean mitral valve gradient is 2.0 mmHg. Tricuspid Valve: The tricuspid valve is normal in structure. Tricuspid valve regurgitation is trivial. No evidence of tricuspid stenosis. Aortic Valve: The  aortic valve is grossly normal. Aortic valve regurgitation is not visualized. No aortic stenosis is present. Aortic valve mean gradient measures 5.0 mmHg. Aortic valve peak gradient measures 9.2 mmHg. Aortic valve area, by VTI measures 3.06 cm. Pulmonic Valve: The pulmonic valve was not well visualized. Pulmonic valve regurgitation is not visualized. No evidence of pulmonic stenosis. Aorta: The aortic root and ascending aorta are structurally normal, with no evidence of dilitation. Venous: The inferior vena cava was not well visualized. IAS/Shunts: The atrial septum is grossly normal.  LEFT VENTRICLE PLAX 2D LVIDd:         5.70 cm   Diastology LVIDs:         4.20 cm   LV e' medial:    8.05 cm/s LV PW:         1.10 cm   LV E/e' medial:  13.7 LV IVS:        1.20 cm   LV e' lateral:   10.80 cm/s LVOT diam:     2.00 cm   LV E/e' lateral: 10.2 LV SV:         94 LV SV Index:   33 LVOT Area:     3.14 cm  RIGHT VENTRICLE RV Basal diam:  3.65 cm RV Mid diam:    2.80 cm LEFT ATRIUM              Index        RIGHT ATRIUM           Index LA diam:        4.60 cm 1.62 cm/m   RA Area:     18.30 cm LA Vol (A2C):   76.5 ml 26.89 ml/m  RA Volume:   51.80 ml  18.21 ml/m LA Vol (A4C):   66.8 ml 23.48 ml/m LA Biplane Vol: 74.9 ml 26.33 ml/m  AORTIC VALVE AV Area (Vmax):    2.50 cm AV Area (Vmean):   2.42 cm AV Area (VTI):     3.06 cm AV Vmax:           152.00 cm/s AV Vmean:          110.000 cm/s AV VTI:            0.308 m AV Peak Grad:      9.2 mmHg AV Mean Grad:      5.0 mmHg LVOT Vmax:         121.00 cm/s LVOT Vmean:        84.600 cm/s LVOT VTI:          0.300 m LVOT/AV VTI ratio: 0.97  AORTA Ao Root diam: 3.90 cm Ao Asc diam:  2.80 cm MITRAL VALVE                TRICUSPID VALVE MV Area (PHT): 3.28 cm     TR Peak grad:   17.5 mmHg MV Area VTI:   3.08 cm     TR Vmax:        209.00 cm/s MV Peak grad:  4.2 mmHg MV Mean grad:  2.0 mmHg     SHUNTS MV Vmax:       1.03 m/s     Systemic VTI:  0.30 m MV Vmean:      65.6 cm/s    Systemic Diam: 2.00 cm MV Decel Time: 231 msec MV E velocity: 110.00 cm/s MV A velocity: 96.80 cm/s MV E/A ratio:  1.14 Buford Dresser MD Electronically signed by Buford Dresser MD Signature Date/Time:  11/10/2022/8:46:54 PM    Final       Patient Profile     57 y.o. male with past medical history of coronary artery disease, hyperlipidemia, sleep apnea, seizure disorder, obesity admitted with non-ST elevation myocardial infarction.  Assessment & Plan    1 coronary artery disease-plan to continue aspirin, heparin, carvedilol and statin.  Catheterization results noted.  Plan is for coronary artery bypass and graft with LIMA to the LAD.  Cardiothoracic surgery notified.  2 history of ischemic cardiomyopathy-LV function mildly reduced on most recent echocardiogram.  3 hyperlipidemia-continue statin.  4 hypertension-blood pressure controlled.  Continue present medications.  5 history of seizure disorder-continue present medications.  For questions or updates,  please contact [place] Please consult [email or web address] for contact info under        Signed, Kirk Ruths, MD  11/12/2022, 10:56 AM

## 2022-11-12 NOTE — Progress Notes (Addendum)
ANTICOAGULATION CONSULT NOTE - Follow up [facility] for Heparin Indication: chest pain/ACS  Allergies  Allergen Reactions   Zithromax [Azithromycin] Hives    Patient Measurements: Height: 6' (182.9 cm) Weight: (!) 172.8 kg (381 lb) IBW/kg (Calculated) : 77.6 Heparin Dosing Weight: 121.7 kg  Vital Signs: Temp: 98.2 F (36.8 C) (01/08 0431) Temp Source: Oral (01/08 0431) BP: 117/71 (01/08 0431) Pulse Rate: 85 (01/08 0431)  Labs: Recent Labs    11/10/22 0832 11/10/22 0850 11/10/22 1020 11/10/22 1705 11/10/22 1937 11/11/22 0046 11/11/22 0949 11/11/22 1139 11/11/22 1413 11/11/22 2055 11/12/22 0543  HGB 15.0 15.3  --   --   --  13.8  --   --   --   --  14.4  HCT 46.1 45.0  --   --   --  42.5  --   --   --   --  44.4  PLT 155  --   --   --   --  159  --   --   --   --  164  APTT 29  --   --   --   --   --   --   --   --   --   --   LABPROT 12.3  --   --   --   --   --   --   --   --   --   --   INR 0.9  --   --   --   --   --   --   --   --   --   --   HEPARINUNFRC  --   --   --   --    < > 0.34  --   --  0.29* 0.29* 0.69  CREATININE 1.00 0.90  --   --   --  0.89  --   --   --   --  1.02  TROPONINIHS 177*  --    < > 4,401*  --   --  2,441* 2,566*  --   --   --    < > = values in this interval not displayed.     Estimated Creatinine Clearance: 129.2 mL/min (by C-G formula based on SCr of 1.02 mg/dL).   Medical History: Past Medical History:  Diagnosis Date   Anxiety    Brain tumor ([place])    CAD (coronary artery disease)    a. 03/2015 NSTEMI: LM nl, LAD 50ost, 100p (2.75x38 Synergy DES), 75d, RI nl, LCX minor irregs, OM1 min irregs, RCA 50ost, EF 35-45%.   Depression    GERD (gastroesophageal reflux disease)    Hyperlipidemia    Ischemic cardiomyopathy    a. 03/2015 EF 35-45% by LV gram @ time of NSTEMI;  b. 03/2015 Echo: EF 50-55%, sev HK to DK of dist an, apical, and infap walls.Gr 1 DD.   Kidney stone    Metabolic syndrome 4/40/3474   Migraines     Morbid obesity ([place])    Pre-diabetes    Seizures ([place])    a. Has auras and metalic taste in mouth.  Last one 06/2014- last a few seconds, has one every couple weeks, Dr Mare Loan is neurologist and is aware.    Sleep apnea 2009   a. On CPAP.   Tobacco abuse      Assessment: 57 yo m with a history of CAD, NSTEMI. Patient is presenting with chest pain. Patient is  not on anticoagulation prior to arrival. Heparin per pharmacy consult placed for chest pain/ACS.  Heparin level 0.69 is at high end of therapeutic on 2050 units/hr. CBC stable. Planned cath 1/8.   Goal of Therapy:  Heparin level 0.3-0.7 units/ml Monitor platelets by anticoagulation protocol: Yes   Plan:  Decrease heparin infusion 1950 units/hr Daily heparin level and CBC Monitor s/sx of bleeding  F/u cath   ADDENDUM 9:45- s/p Cath with plan for TCTS consult. Pharmacy consulted to restart heparin 2 hrs after TR band removal. Patient with some bleeding during TR band deflation so it was re-inflated. Anticipate off around noon. Will time heparin for 1400 for now and f/u with RN if any further delays.   Benetta Spar, PharmD, BCPS, BCCP Clinical Pharmacist  Please check AMION for all [place] phone numbers After 10:00 PM, call [place] [phone]

## 2022-11-12 NOTE — Plan of Care (Signed)
Pt had RHC today; TR band removed and heparin gtt started.   Problem: Education: Goal: Understanding of cardiac disease, CV risk reduction, and recovery process will improve Outcome: Progressing Goal: Individualized Educational Video(s) Outcome: Progressing   Problem: Activity: Goal: Ability to tolerate increased activity will improve Outcome: Progressing   Problem: Cardiac: Goal: Ability to achieve and maintain adequate cardiovascular perfusion will improve Outcome: Progressing   Problem: Health Behavior/Discharge Planning: Goal: Ability to safely manage health-related needs after discharge will improve Outcome: Progressing   Problem: Education: Goal: Understanding of CV disease, CV risk reduction, and recovery process will improve Outcome: Progressing Goal: Individualized Educational Video(s) Outcome: Progressing   Problem: Activity: Goal: Ability to return to baseline activity level will improve Outcome: Progressing   Problem: Cardiovascular: Goal: Ability to achieve and maintain adequate cardiovascular perfusion will improve Outcome: Progressing Goal: Vascular access site(s) Level 0-1 will be maintained Outcome: Progressing   Problem: Health Behavior/Discharge Planning: Goal: Ability to safely manage health-related needs after discharge will improve Outcome: Progressing   Problem: Education: Goal: Knowledge of General Education information will improve Description: Including pain rating scale, medication(s)/side effects and non-pharmacologic comfort measures Outcome: Progressing   Problem: Health Behavior/Discharge Planning: Goal: Ability to manage health-related needs will improve Outcome: Progressing   Problem: Clinical Measurements: Goal: Ability to maintain clinical measurements within normal limits will improve Outcome: Progressing Goal: Will remain free from infection Outcome: Progressing Goal: Diagnostic test results will improve Outcome:  Progressing Goal: Respiratory complications will improve Outcome: Progressing Goal: Cardiovascular complication will be avoided Outcome: Progressing   Problem: Activity: Goal: Risk for activity intolerance will decrease Outcome: Progressing   Problem: Nutrition: Goal: Adequate nutrition will be maintained Outcome: Progressing   Problem: Coping: Goal: Level of anxiety will decrease Outcome: Progressing   Problem: Elimination: Goal: Will not experience complications related to bowel motility Outcome: Progressing Goal: Will not experience complications related to urinary retention Outcome: Progressing   Problem: Pain Managment: Goal: General experience of comfort will improve Outcome: Progressing   Problem: Safety: Goal: Ability to remain free from injury will improve Outcome: Progressing   Problem: Skin Integrity: Goal: Risk for impaired skin integrity will decrease Outcome: Progressing

## 2022-11-13 ENCOUNTER — Inpatient Hospital Stay (HOSPITAL_COMMUNITY)
Admission: EM | Disposition: A | Discharge status: Rehabilitation Facility | Payer: Self-pay | Source: Home / Self Care | Attending: Thoracic Surgery (Cardiothoracic Vascular Surgery)

## 2022-11-13 ENCOUNTER — Inpatient Hospital Stay (HOSPITAL_COMMUNITY): Payer: Medicare PPO

## 2022-11-13 ENCOUNTER — Other Ambulatory Visit: Payer: Self-pay

## 2022-11-13 ENCOUNTER — Encounter (HOSPITAL_COMMUNITY): Payer: Self-pay | Admitting: Cardiology

## 2022-11-13 ENCOUNTER — Inpatient Hospital Stay (HOSPITAL_COMMUNITY): Payer: Medicare PPO | Admitting: Anesthesiology

## 2022-11-13 DIAGNOSIS — J9600 Acute respiratory failure, unspecified whether with hypoxia or hypercapnia: Secondary | ICD-10-CM

## 2022-11-13 DIAGNOSIS — I214 Non-ST elevation (NSTEMI) myocardial infarction: Secondary | ICD-10-CM | POA: Diagnosis not present

## 2022-11-13 DIAGNOSIS — F17210 Nicotine dependence, cigarettes, uncomplicated: Secondary | ICD-10-CM

## 2022-11-13 DIAGNOSIS — I2510 Atherosclerotic heart disease of native coronary artery without angina pectoris: Secondary | ICD-10-CM | POA: Diagnosis not present

## 2022-11-13 DIAGNOSIS — Z951 Presence of aortocoronary bypass graft: Secondary | ICD-10-CM

## 2022-11-13 DIAGNOSIS — Z6841 Body Mass Index (BMI) 40.0 and over, adult: Secondary | ICD-10-CM

## 2022-11-13 DIAGNOSIS — F418 Other specified anxiety disorders: Secondary | ICD-10-CM

## 2022-11-13 HISTORY — PX: TEE WITHOUT CARDIOVERSION: SHX5443

## 2022-11-13 HISTORY — PX: CORONARY ARTERY BYPASS GRAFT: SHX141

## 2022-11-13 LAB — POCT I-STAT 7, (LYTES, BLD GAS, ICA,H+H)
Acid-Base Excess: 1 mmol/L (ref 0.0–2.0)
Acid-base deficit: 4 mmol/L — ABNORMAL HIGH (ref 0.0–2.0)
Acid-base deficit: 2 mmol/L (ref 0.0–2.0)
Acid-base deficit: 4 mmol/L — ABNORMAL HIGH (ref 0.0–2.0)
Acid-base deficit: 4 mmol/L — ABNORMAL HIGH (ref 0.0–2.0)
Acid-base deficit: 3 mmol/L — ABNORMAL HIGH (ref 0.0–2.0)
Acid-base deficit: 2 mmol/L (ref 0.0–2.0)
Bicarbonate: 23.3 mmol/L (ref 20.0–28.0)
Bicarbonate: 28.6 mmol/L — ABNORMAL HIGH (ref 20.0–28.0)
Bicarbonate: 25.3 mmol/L (ref 20.0–28.0)
Bicarbonate: 21.6 mmol/L (ref 20.0–28.0)
Bicarbonate: 22.1 mmol/L (ref 20.0–28.0)
Bicarbonate: 21.5 mmol/L (ref 20.0–28.0)
Bicarbonate: 22.7 mmol/L (ref 20.0–28.0)
Calcium, Ion: 1.19 mmol/L (ref 1.15–1.40)
Calcium, Ion: 1.14 mmol/L — ABNORMAL LOW (ref 1.15–1.40)
Calcium, Ion: 1.2 mmol/L (ref 1.15–1.40)
Calcium, Ion: 1.16 mmol/L (ref 1.15–1.40)
Calcium, Ion: 1.19 mmol/L (ref 1.15–1.40)
Calcium, Ion: 1.16 mmol/L (ref 1.15–1.40)
Calcium, Ion: 1.2 mmol/L (ref 1.15–1.40)
HCT: 39 % (ref 39.0–52.0)
HCT: 40 % (ref 39.0–52.0)
HCT: 42 % (ref 39.0–52.0)
HCT: 39 % (ref 39.0–52.0)
HCT: 39 % (ref 39.0–52.0)
HCT: 40 % (ref 39.0–52.0)
HCT: 40 % (ref 39.0–52.0)
Hemoglobin: 13.3 g/dL (ref 13.0–17.0)
Hemoglobin: 13.6 g/dL (ref 13.0–17.0)
Hemoglobin: 14.3 g/dL (ref 13.0–17.0)
Hemoglobin: 13.3 g/dL (ref 13.0–17.0)
Hemoglobin: 13.3 g/dL (ref 13.0–17.0)
Hemoglobin: 13.6 g/dL (ref 13.0–17.0)
Hemoglobin: 13.6 g/dL (ref 13.0–17.0)
O2 Saturation: 95 %
O2 Saturation: 97 %
O2 Saturation: 91 %
O2 Saturation: 98 %
O2 Saturation: 97 %
O2 Saturation: 90 %
O2 Saturation: 92 %
Patient temperature: 34.3
Patient temperature: 36.1
Patient temperature: 37
Patient temperature: 36.4
Patient temperature: 38
Potassium: 3.7 mmol/L (ref 3.5–5.1)
Potassium: 6.1 mmol/L — ABNORMAL HIGH (ref 3.5–5.1)
Potassium: 4.6 mmol/L (ref 3.5–5.1)
Potassium: 4.3 mmol/L (ref 3.5–5.1)
Potassium: 4.4 mmol/L (ref 3.5–5.1)
Potassium: 4.4 mmol/L (ref 3.5–5.1)
Potassium: 4.3 mmol/L (ref 3.5–5.1)
Sodium: 140 mmol/L (ref 135–145)
Sodium: 136 mmol/L (ref 135–145)
Sodium: 137 mmol/L (ref 135–145)
Sodium: 137 mmol/L (ref 135–145)
Sodium: 137 mmol/L (ref 135–145)
Sodium: 137 mmol/L (ref 135–145)
Sodium: 137 mmol/L (ref 135–145)
TCO2: 25 mmol/L (ref 22–32)
TCO2: 30 mmol/L (ref 22–32)
TCO2: 27 mmol/L (ref 22–32)
TCO2: 23 mmol/L (ref 22–32)
TCO2: 23 mmol/L (ref 22–32)
TCO2: 23 mmol/L (ref 22–32)
TCO2: 24 mmol/L (ref 22–32)
pCO2 arterial: 48.6 mmHg — ABNORMAL HIGH (ref 32–48)
pCO2 arterial: 57.7 mmHg — ABNORMAL HIGH (ref 32–48)
pCO2 arterial: 46.8 mmHg (ref 32–48)
pCO2 arterial: 39.8 mmHg (ref 32–48)
pCO2 arterial: 41.2 mmHg (ref 32–48)
pCO2 arterial: 37 mmHg (ref 32–48)
pCO2 arterial: 40.4 mmHg (ref 32–48)
pH, Arterial: 7.288 — ABNORMAL LOW (ref 7.35–7.45)
pH, Arterial: 7.303 — ABNORMAL LOW (ref 7.35–7.45)
pH, Arterial: 7.328 — ABNORMAL LOW (ref 7.35–7.45)
pH, Arterial: 7.339 — ABNORMAL LOW (ref 7.35–7.45)
pH, Arterial: 7.337 — ABNORMAL LOW (ref 7.35–7.45)
pH, Arterial: 7.37 (ref 7.35–7.45)
pH, Arterial: 7.363 (ref 7.35–7.45)
pO2, Arterial: 87 mmHg (ref 83–108)
pO2, Arterial: 101 mmHg (ref 83–108)
pO2, Arterial: 58 mmHg — ABNORMAL LOW (ref 83–108)
pO2, Arterial: 108 mmHg (ref 83–108)
pO2, Arterial: 92 mmHg (ref 83–108)
pO2, Arterial: 59 mmHg — ABNORMAL LOW (ref 83–108)
pO2, Arterial: 69 mmHg — ABNORMAL LOW (ref 83–108)

## 2022-11-13 LAB — CBC
HCT: 42.8 % (ref 39.0–52.0)
HCT: 41.6 % (ref 39.0–52.0)
HCT: 40.8 % (ref 39.0–52.0)
Hemoglobin: 14.6 g/dL (ref 13.0–17.0)
Hemoglobin: 14.2 g/dL (ref 13.0–17.0)
Hemoglobin: 13.7 g/dL (ref 13.0–17.0)
MCH: 31.6 pg (ref 26.0–34.0)
MCH: 32.3 pg (ref 26.0–34.0)
MCH: 31.6 pg (ref 26.0–34.0)
MCHC: 34.1 g/dL (ref 30.0–36.0)
MCHC: 34.1 g/dL (ref 30.0–36.0)
MCHC: 33.6 g/dL (ref 30.0–36.0)
MCV: 92.6 fL (ref 80.0–100.0)
MCV: 94.5 fL (ref 80.0–100.0)
MCV: 94 fL (ref 80.0–100.0)
Platelets: 156 10*3/uL (ref 150–400)
Platelets: 133 10*3/uL — ABNORMAL LOW (ref 150–400)
Platelets: 146 10*3/uL — ABNORMAL LOW (ref 150–400)
RBC: 4.62 MIL/uL (ref 4.22–5.81)
RBC: 4.4 MIL/uL (ref 4.22–5.81)
RBC: 4.34 MIL/uL (ref 4.22–5.81)
RDW: 14.4 % (ref 11.5–15.5)
RDW: 14.5 % (ref 11.5–15.5)
RDW: 14.3 % (ref 11.5–15.5)
WBC: 7.4 10*3/uL (ref 4.0–10.5)
WBC: 8.5 10*3/uL (ref 4.0–10.5)
WBC: 8.4 10*3/uL (ref 4.0–10.5)
nRBC: 0 % (ref 0.0–0.2)
nRBC: 0 % (ref 0.0–0.2)
nRBC: 0 % (ref 0.0–0.2)

## 2022-11-13 LAB — POCT I-STAT, CHEM 8
BUN: 9 mg/dL (ref 6–20)
BUN: 11 mg/dL (ref 6–20)
BUN: 13 mg/dL (ref 6–20)
BUN: 9 mg/dL (ref 6–20)
Calcium, Ion: 1.19 mmol/L (ref 1.15–1.40)
Calcium, Ion: 1.18 mmol/L (ref 1.15–1.40)
Calcium, Ion: 1.12 mmol/L — ABNORMAL LOW (ref 1.15–1.40)
Calcium, Ion: 1.15 mmol/L (ref 1.15–1.40)
Chloride: 104 mmol/L (ref 98–111)
Chloride: 103 mmol/L (ref 98–111)
Chloride: 104 mmol/L (ref 98–111)
Chloride: 104 mmol/L (ref 98–111)
Creatinine, Ser: 0.7 mg/dL (ref 0.61–1.24)
Creatinine, Ser: 0.7 mg/dL (ref 0.61–1.24)
Creatinine, Ser: 0.8 mg/dL (ref 0.61–1.24)
Creatinine, Ser: 0.9 mg/dL (ref 0.61–1.24)
Glucose, Bld: 93 mg/dL (ref 70–99)
Glucose, Bld: 96 mg/dL (ref 70–99)
Glucose, Bld: 94 mg/dL (ref 70–99)
Glucose, Bld: 97 mg/dL (ref 70–99)
HCT: 43 % (ref 39.0–52.0)
HCT: 40 % (ref 39.0–52.0)
HCT: 41 % (ref 39.0–52.0)
HCT: 42 % (ref 39.0–52.0)
Hemoglobin: 14.6 g/dL (ref 13.0–17.0)
Hemoglobin: 13.6 g/dL (ref 13.0–17.0)
Hemoglobin: 13.9 g/dL (ref 13.0–17.0)
Hemoglobin: 14.3 g/dL (ref 13.0–17.0)
Potassium: 3.8 mmol/L (ref 3.5–5.1)
Potassium: 4.6 mmol/L (ref 3.5–5.1)
Potassium: 4.2 mmol/L (ref 3.5–5.1)
Potassium: 5.9 mmol/L — ABNORMAL HIGH (ref 3.5–5.1)
Sodium: 140 mmol/L (ref 135–145)
Sodium: 139 mmol/L (ref 135–145)
Sodium: 137 mmol/L (ref 135–145)
Sodium: 139 mmol/L (ref 135–145)
TCO2: 22 mmol/L (ref 22–32)
TCO2: 27 mmol/L (ref 22–32)
TCO2: 26 mmol/L (ref 22–32)
TCO2: 28 mmol/L (ref 22–32)

## 2022-11-13 LAB — BASIC METABOLIC PANEL
Anion gap: 10 (ref 5–15)
BUN: 9 mg/dL (ref 6–20)
CO2: 25 mmol/L (ref 22–32)
Calcium: 9.2 mg/dL (ref 8.9–10.3)
Chloride: 102 mmol/L (ref 98–111)
Creatinine, Ser: 1.01 mg/dL (ref 0.61–1.24)
GFR, Estimated: >60 mL/min (ref >60)
Glucose, Bld: 96 mg/dL (ref 70–99)
Potassium: 4.2 mmol/L (ref 3.5–5.1)
Sodium: 137 mmol/L (ref 135–145)

## 2022-11-13 LAB — GLUCOSE, CAPILLARY
Glucose-Capillary: 159 mg/dL — ABNORMAL HIGH (ref 70–99)
Glucose-Capillary: 152 mg/dL — ABNORMAL HIGH (ref 70–99)
Glucose-Capillary: 136 mg/dL — ABNORMAL HIGH (ref 70–99)
Glucose-Capillary: 137 mg/dL — ABNORMAL HIGH (ref 70–99)
Glucose-Capillary: 150 mg/dL — ABNORMAL HIGH (ref 70–99)
Glucose-Capillary: 157 mg/dL — ABNORMAL HIGH (ref 70–99)

## 2022-11-13 LAB — ECHO INTRAOPERATIVE TEE
Height: 72 in
Weight: 6096 oz

## 2022-11-13 LAB — APTT: aPTT: 30 seconds (ref 24–36)

## 2022-11-13 LAB — PROTIME-INR
INR: 1.2 (ref 0.8–1.2)
Prothrombin Time: 15.5 seconds — ABNORMAL HIGH (ref 11.4–15.2)

## 2022-11-13 LAB — HEPARIN LEVEL (UNFRACTIONATED): Heparin Unfractionated: 0.35 IU/mL (ref 0.30–0.70)

## 2022-11-13 LAB — LIPOPROTEIN A (LPA): Lipoprotein (a): <8.4 nmol/L (ref <75.0)

## 2022-11-13 SURGERY — OFF PUMP CORONARY ARTERY BYPASS GRAFTING (CABG)
Anesthesia: General | Site: Chest

## 2022-11-13 MED ORDER — MIDAZOLAM HCL 2 MG/2ML IJ SOLN
INTRAMUSCULAR | Status: AC
  Filled 2022-11-13: qty 2

## 2022-11-13 MED ORDER — MIDAZOLAM HCL 2 MG/2ML IJ SOLN: 2.0000 mg | Freq: Once | INTRAMUSCULAR | Status: DC

## 2022-11-13 MED ORDER — FENTANYL CITRATE (PF) 250 MCG/5ML IJ SOLN
INTRAMUSCULAR | Status: AC
  Filled 2022-11-13: qty 5

## 2022-11-13 MED ORDER — CHLORHEXIDINE GLUCONATE 0.12 % MT SOLN
15.0000 mL | OROMUCOSAL | Status: AC
  Administered 2022-11-13: 15 mL via OROMUCOSAL

## 2022-11-13 MED ORDER — METOCLOPRAMIDE HCL 5 MG/ML IJ SOLN
10.0000 mg | Freq: Four times a day (QID) | INTRAMUSCULAR | Status: AC
  Administered 2022-11-13 – 2022-11-14 (×4): 10 mg via INTRAVENOUS
  Filled 2022-11-13 (×4): qty 2

## 2022-11-13 MED ORDER — DEXMEDETOMIDINE HCL IN NACL 400 MCG/100ML IV SOLN
0.0000 ug/kg/h | INTRAVENOUS | Status: DC
  Administered 2022-11-13: 0.6 ug/kg/h via INTRAVENOUS
  Administered 2022-11-13 – 2022-11-14 (×3): 0.7 ug/kg/h via INTRAVENOUS
  Filled 2022-11-13 (×4): qty 100

## 2022-11-13 MED ORDER — LIDOCAINE 2% (20 MG/ML) 5 ML SYRINGE
INTRAMUSCULAR | Status: AC
  Filled 2022-11-13: qty 5

## 2022-11-13 MED ORDER — NITROGLYCERIN IN D5W 200-5 MCG/ML-% IV SOLN: 0.0000 ug/min | INTRAVENOUS | Status: DC

## 2022-11-13 MED ORDER — FENTANYL CITRATE (PF) 100 MCG/2ML IJ SOLN: 100.0000 ug | Freq: Once | INTRAMUSCULAR | Status: DC

## 2022-11-13 MED ORDER — PROTAMINE SULFATE 10 MG/ML IV SOLN
INTRAVENOUS | Status: DC | PRN
  Administered 2022-11-13: 30 mg via INTRAVENOUS
  Administered 2022-11-13: 230 mg via INTRAVENOUS

## 2022-11-13 MED ORDER — SODIUM CHLORIDE 0.9 % IV SOLN
INTRAVENOUS | Status: DC
  Administered 2022-11-14: 10 mL via INTRAVENOUS

## 2022-11-13 MED ORDER — MORPHINE SULFATE (PF) 2 MG/ML IV SOLN
1.0000 mg | INTRAVENOUS | Status: DC | PRN
  Administered 2022-11-13 – 2022-11-14 (×3): 2 mg via INTRAVENOUS
  Administered 2022-11-14 (×2): 4 mg via INTRAVENOUS
  Administered 2022-11-14: 3 mg via INTRAVENOUS
  Administered 2022-11-14 (×2): 2 mg via INTRAVENOUS
  Administered 2022-11-14: 3 mg via INTRAVENOUS
  Administered 2022-11-14: 2 mg via INTRAVENOUS
  Administered 2022-11-15 (×2): 4 mg via INTRAVENOUS
  Filled 2022-11-13 (×2): qty 1
  Filled 2022-11-13 (×2): qty 2
  Filled 2022-11-13 (×2): qty 1
  Filled 2022-11-13: qty 2
  Filled 2022-11-13 (×2): qty 1
  Filled 2022-11-13 (×3): qty 2

## 2022-11-13 MED ORDER — HEPARIN SODIUM (PORCINE) 1000 UNIT/ML IJ SOLN
INTRAMUSCULAR | Status: AC
  Filled 2022-11-13: qty 1

## 2022-11-13 MED ORDER — PROPOFOL 10 MG/ML IV BOLUS
INTRAVENOUS | Status: AC
  Filled 2022-11-13: qty 20

## 2022-11-13 MED ORDER — ASPIRIN 81 MG PO CHEW
324.0000 mg | CHEWABLE_TABLET | Freq: Every day | ORAL | Status: DC
  Administered 2022-11-18: 324 mg
  Filled 2022-11-13 (×2): qty 4

## 2022-11-13 MED ORDER — BISACODYL 5 MG PO TBEC
10.0000 mg | DELAYED_RELEASE_TABLET | Freq: Every day | ORAL | Status: DC
  Administered 2022-11-15 – 2022-11-22 (×7): 10 mg via ORAL
  Filled 2022-11-13 (×7): qty 2

## 2022-11-13 MED ORDER — ONDANSETRON HCL 4 MG/2ML IJ SOLN
4.0000 mg | Freq: Four times a day (QID) | INTRAMUSCULAR | Status: DC | PRN
  Administered 2022-11-15: 4 mg via INTRAVENOUS
  Filled 2022-11-13: qty 2

## 2022-11-13 MED ORDER — ALBUMIN HUMAN 5 % IV SOLN
250.0000 mL | INTRAVENOUS | Status: AC | PRN
  Administered 2022-11-13 (×2): 12.5 g via INTRAVENOUS

## 2022-11-13 MED ORDER — LIDOCAINE 2% (20 MG/ML) 5 ML SYRINGE
INTRAMUSCULAR | Status: DC | PRN
  Administered 2022-11-13: 100 mg via INTRAVENOUS

## 2022-11-13 MED ORDER — ACETAMINOPHEN 160 MG/5ML PO SOLN: 650.0000 mg | Freq: Once | ORAL | Status: AC

## 2022-11-13 MED ORDER — OXYCODONE HCL 5 MG PO TABS
5.0000 mg | ORAL_TABLET | ORAL | Status: DC | PRN
  Administered 2022-11-15 – 2022-11-20 (×5): 10 mg via ORAL
  Filled 2022-11-13 (×6): qty 2

## 2022-11-13 MED ORDER — DEXTROSE 50 % IV SOLN: 0.0000 mL | INTRAVENOUS | Status: DC | PRN

## 2022-11-13 MED ORDER — MIDAZOLAM HCL 2 MG/2ML IJ SOLN
INTRAMUSCULAR | Status: DC | PRN
  Administered 2022-11-13 (×4): 2 mg via INTRAVENOUS
  Administered 2022-11-13: 4 mg via INTRAVENOUS

## 2022-11-13 MED ORDER — FENTANYL CITRATE (PF) 100 MCG/2ML IJ SOLN
INTRAMUSCULAR | Status: AC
  Administered 2022-11-13: 100 ug
  Filled 2022-11-13: qty 2

## 2022-11-13 MED ORDER — 0.9 % SODIUM CHLORIDE (POUR BTL) OPTIME
TOPICAL | Status: DC | PRN
  Administered 2022-11-13: 4000 mL

## 2022-11-13 MED ORDER — SODIUM CHLORIDE 0.45 % IV SOLN: INTRAVENOUS | Status: DC | PRN

## 2022-11-13 MED ORDER — PLASMA-LYTE A IV SOLN
INTRAVENOUS | Status: DC | PRN
  Administered 2022-11-13: 500 mL via INTRAVASCULAR

## 2022-11-13 MED ORDER — METOPROLOL TARTRATE 5 MG/5ML IV SOLN: 2.5000 mg | INTRAVENOUS | Status: DC | PRN

## 2022-11-13 MED ORDER — PHENYLEPHRINE HCL-NACL 20-0.9 MG/250ML-% IV SOLN: 0.0000 ug/min | INTRAVENOUS | Status: DC

## 2022-11-13 MED ORDER — VANCOMYCIN HCL IN DEXTROSE 1-5 GM/200ML-% IV SOLN
1000.0000 mg | Freq: Once | INTRAVENOUS | Status: AC
  Administered 2022-11-13: 1000 mg via INTRAVENOUS
  Filled 2022-11-13: qty 200

## 2022-11-13 MED ORDER — CHLORHEXIDINE GLUCONATE 0.12 % MT SOLN
OROMUCOSAL | Status: AC
  Administered 2022-11-13: 15 mL via OROMUCOSAL
  Filled 2022-11-13: qty 15

## 2022-11-13 MED ORDER — LACTATED RINGERS IV SOLN: INTRAVENOUS | Status: DC

## 2022-11-13 MED ORDER — BISACODYL 10 MG RE SUPP
10.0000 mg | Freq: Every day | RECTAL | Status: DC
  Filled 2022-11-13: qty 1

## 2022-11-13 MED ORDER — PHENYLEPHRINE 80 MCG/ML (10ML) SYRINGE FOR IV PUSH (FOR BLOOD PRESSURE SUPPORT)
PREFILLED_SYRINGE | INTRAVENOUS | Status: DC | PRN
  Administered 2022-11-13: 80 ug via INTRAVENOUS
  Administered 2022-11-13 (×3): 160 ug via INTRAVENOUS
  Administered 2022-11-13: 80 ug via INTRAVENOUS
  Administered 2022-11-13: 160 ug via INTRAVENOUS

## 2022-11-13 MED ORDER — CHLORHEXIDINE GLUCONATE 0.12 % MT SOLN: 15.0000 mL | Freq: Once | OROMUCOSAL | Status: AC

## 2022-11-13 MED ORDER — SODIUM CHLORIDE 0.9% FLUSH
3.0000 mL | Freq: Two times a day (BID) | INTRAVENOUS | Status: DC
  Administered 2022-11-14 – 2022-11-19 (×11): 3 mL via INTRAVENOUS

## 2022-11-13 MED ORDER — DOCUSATE SODIUM 100 MG PO CAPS
200.0000 mg | ORAL_CAPSULE | Freq: Every day | ORAL | Status: DC
  Administered 2022-11-15 – 2022-11-22 (×8): 200 mg via ORAL
  Filled 2022-11-13 (×9): qty 2

## 2022-11-13 MED ORDER — PROPOFOL 10 MG/ML IV BOLUS
INTRAVENOUS | Status: DC | PRN
  Administered 2022-11-13: 30 mg via INTRAVENOUS
  Administered 2022-11-13: 150 mg via INTRAVENOUS

## 2022-11-13 MED ORDER — LACTATED RINGERS IV SOLN: INTRAVENOUS | Status: DC | PRN

## 2022-11-13 MED ORDER — SODIUM CHLORIDE 0.9% FLUSH: 3.0000 mL | INTRAVENOUS | Status: DC | PRN

## 2022-11-13 MED ORDER — CHLORHEXIDINE GLUCONATE CLOTH 2 % EX PADS
6.0000 | MEDICATED_PAD | Freq: Every day | CUTANEOUS | Status: DC
  Administered 2022-11-14 – 2022-11-21 (×8): 6 via TOPICAL

## 2022-11-13 MED ORDER — MAGNESIUM SULFATE 4 GM/100ML IV SOLN
4.0000 g | Freq: Once | INTRAVENOUS | Status: AC
  Administered 2022-11-13: 4 g via INTRAVENOUS
  Filled 2022-11-13: qty 100

## 2022-11-13 MED ORDER — ROCURONIUM BROMIDE 10 MG/ML (PF) SYRINGE
PREFILLED_SYRINGE | INTRAVENOUS | Status: DC | PRN
  Administered 2022-11-13: 50 mg via INTRAVENOUS
  Administered 2022-11-13: 100 mg via INTRAVENOUS

## 2022-11-13 MED ORDER — METOPROLOL TARTRATE 12.5 MG HALF TABLET
12.5000 mg | ORAL_TABLET | Freq: Two times a day (BID) | ORAL | Status: DC
  Filled 2022-11-13: qty 1

## 2022-11-13 MED ORDER — SODIUM CHLORIDE (PF) 0.9 % IJ SOLN
OROMUCOSAL | Status: DC | PRN
  Administered 2022-11-13 (×2): 4 mL via TOPICAL

## 2022-11-13 MED ORDER — ACETAMINOPHEN 160 MG/5ML PO SOLN
1000.0000 mg | Freq: Four times a day (QID) | ORAL | Status: AC
  Administered 2022-11-13 – 2022-11-14 (×2): 1000 mg
  Filled 2022-11-13 (×2): qty 40.6

## 2022-11-13 MED ORDER — TRAMADOL HCL 50 MG PO TABS
50.0000 mg | ORAL_TABLET | ORAL | Status: DC | PRN
  Administered 2022-11-15 – 2022-11-20 (×3): 100 mg via ORAL
  Filled 2022-11-13 (×3): qty 2

## 2022-11-13 MED ORDER — PHENYLEPHRINE 80 MCG/ML (10ML) SYRINGE FOR IV PUSH (FOR BLOOD PRESSURE SUPPORT)
PREFILLED_SYRINGE | INTRAVENOUS | Status: AC
  Filled 2022-11-13: qty 10

## 2022-11-13 MED ORDER — FAMOTIDINE IN NACL 20-0.9 MG/50ML-% IV SOLN
20.0000 mg | Freq: Two times a day (BID) | INTRAVENOUS | Status: AC
  Administered 2022-11-13 – 2022-11-14 (×2): 20 mg via INTRAVENOUS
  Filled 2022-11-13 (×2): qty 50

## 2022-11-13 MED ORDER — LACTATED RINGERS IV SOLN: 500.0000 mL | Freq: Once | INTRAVENOUS | Status: DC | PRN

## 2022-11-13 MED ORDER — CEFAZOLIN SODIUM-DEXTROSE 2-4 GM/100ML-% IV SOLN
2.0000 g | Freq: Three times a day (TID) | INTRAVENOUS | Status: AC
  Administered 2022-11-13 – 2022-11-15 (×6): 2 g via INTRAVENOUS
  Filled 2022-11-13 (×6): qty 100

## 2022-11-13 MED ORDER — POTASSIUM CHLORIDE 10 MEQ/50ML IV SOLN: 10.0000 meq | INTRAVENOUS | Status: AC

## 2022-11-13 MED ORDER — METOPROLOL TARTRATE 25 MG/10 ML ORAL SUSPENSION: 12.5000 mg | Freq: Two times a day (BID) | ORAL | Status: DC

## 2022-11-13 MED ORDER — ASPIRIN 325 MG PO TBEC
325.0000 mg | DELAYED_RELEASE_TABLET | Freq: Every day | ORAL | Status: DC
  Administered 2022-11-15 – 2022-11-20 (×5): 325 mg via ORAL
  Filled 2022-11-13 (×5): qty 1

## 2022-11-13 MED ORDER — ACETAMINOPHEN 650 MG RE SUPP
650.0000 mg | Freq: Once | RECTAL | Status: AC
  Administered 2022-11-13: 650 mg via RECTAL

## 2022-11-13 MED ORDER — MIDAZOLAM HCL 2 MG/2ML IJ SOLN: 2.0000 mg | INTRAMUSCULAR | Status: DC | PRN

## 2022-11-13 MED ORDER — SODIUM CHLORIDE 0.9 % IV SOLN: 250.0000 mL | INTRAVENOUS | Status: DC

## 2022-11-13 MED ORDER — ROCURONIUM BROMIDE 10 MG/ML (PF) SYRINGE
PREFILLED_SYRINGE | INTRAVENOUS | Status: AC
  Filled 2022-11-13: qty 20

## 2022-11-13 MED ORDER — PROTAMINE SULFATE 10 MG/ML IV SOLN
INTRAVENOUS | Status: AC
  Filled 2022-11-13: qty 25

## 2022-11-13 MED ORDER — PANTOPRAZOLE SODIUM 40 MG PO TBEC: 40.0000 mg | DELAYED_RELEASE_TABLET | Freq: Every day | ORAL | Status: DC

## 2022-11-13 MED ORDER — SUCCINYLCHOLINE CHLORIDE 200 MG/10ML IV SOSY
PREFILLED_SYRINGE | INTRAVENOUS | Status: DC | PRN
  Administered 2022-11-13: 120 mg via INTRAVENOUS

## 2022-11-13 MED ORDER — ORAL CARE MOUTH RINSE: 15.0000 mL | Freq: Once | OROMUCOSAL | Status: AC

## 2022-11-13 MED ORDER — MIDAZOLAM HCL 2 MG/2ML IJ SOLN
INTRAMUSCULAR | Status: AC
  Administered 2022-11-13: 2 mg
  Filled 2022-11-13: qty 2

## 2022-11-13 MED ORDER — INSULIN REGULAR(HUMAN) IN NACL 100-0.9 UT/100ML-% IV SOLN
INTRAVENOUS | Status: DC
  Administered 2022-11-13: 0.6 [IU]/h via INTRAVENOUS

## 2022-11-13 MED ORDER — ACETAMINOPHEN 500 MG PO TABS
1000.0000 mg | ORAL_TABLET | Freq: Four times a day (QID) | ORAL | Status: AC
  Administered 2022-11-15 – 2022-11-18 (×14): 1000 mg via ORAL
  Filled 2022-11-13 (×14): qty 2

## 2022-11-13 MED ORDER — FENTANYL CITRATE (PF) 250 MCG/5ML IJ SOLN
INTRAMUSCULAR | Status: DC | PRN
  Administered 2022-11-13: 100 ug via INTRAVENOUS
  Administered 2022-11-13: 250 ug via INTRAVENOUS
  Administered 2022-11-13: 150 ug via INTRAVENOUS
  Administered 2022-11-13: 200 ug via INTRAVENOUS
  Administered 2022-11-13: 250 ug via INTRAVENOUS
  Administered 2022-11-13: 50 ug via INTRAVENOUS

## 2022-11-13 MED ORDER — HEPARIN SODIUM (PORCINE) 1000 UNIT/ML IJ SOLN
INTRAMUSCULAR | Status: DC | PRN
  Administered 2022-11-13: 26000 [IU] via INTRAVENOUS
  Administered 2022-11-13: 3000 [IU] via INTRAVENOUS

## 2022-11-13 SURGICAL SUPPLY — 79 items
BAG DECANTER FOR FLEXI CONT (MISCELLANEOUS) ×6 IMPLANT
BLADE STERNUM SYSTEM 6 (BLADE) ×3 IMPLANT
BLOWER MISTER CAL-MED (MISCELLANEOUS) ×3 IMPLANT
BNDG ELASTIC 4X5.8 VLCR STR LF (GAUZE/BANDAGES/DRESSINGS) ×3 IMPLANT
BNDG ELASTIC 6X5.8 VLCR STR LF (GAUZE/BANDAGES/DRESSINGS) ×3 IMPLANT
BNDG GAUZE DERMACEA FLUFF 4 (GAUZE/BANDAGES/DRESSINGS) ×3 IMPLANT
BNDG GZE DERMACEA 4 6PLY (GAUZE/BANDAGES/DRESSINGS) ×2
CABLE SURGICAL S-101-97-12 (CABLE) IMPLANT
CANISTER SUCT 3000ML PPV (MISCELLANEOUS) ×3 IMPLANT
CANNULA MC2 2 STG 29/37 NON-V (CANNULA) ×3 IMPLANT
CANNULA MC2 TWO STAGE (CANNULA)
CANNULA NON VENT 20FR 12 (CANNULA) ×3 IMPLANT
CLIP RETRACTION 3.0MM CORONARY (MISCELLANEOUS) ×3 IMPLANT
CLIP VESOCCLUDE MED 24/CT (CLIP) IMPLANT
CLIP VESOCCLUDE SM WIDE 24/CT (CLIP) IMPLANT
CONN ST 1/2X1/2  BEN (MISCELLANEOUS)
CONN ST 1/2X1/2 BEN (MISCELLANEOUS) ×3 IMPLANT
CONNECTOR BLAKE 2:1 CARIO BLK (MISCELLANEOUS) IMPLANT
DRAIN CHANNEL 19F RND (DRAIN) IMPLANT
DRAIN CONNECTOR BLAKE 1:1 (MISCELLANEOUS) IMPLANT
DRAPE CARDIOVASCULAR INCISE (DRAPES) ×2
DRAPE INCISE IOBAN 66X45 STRL (DRAPES) ×3 IMPLANT
DRAPE SRG 135X102X78XABS (DRAPES) ×3 IMPLANT
DRAPE WARM FLUID 44X44 (DRAPES) ×3 IMPLANT
DRESSING PEEL AND PLAC PRVNA20 (GAUZE/BANDAGES/DRESSINGS) IMPLANT
DRSG AQUACEL AG ADV 3.5X10 (GAUZE/BANDAGES/DRESSINGS) IMPLANT
DRSG COVADERM 4X10 (GAUZE/BANDAGES/DRESSINGS) IMPLANT
DRSG COVADERM 4X14 (GAUZE/BANDAGES/DRESSINGS) ×3 IMPLANT
DRSG PEEL AND PLACE PREVENA 20 (GAUZE/BANDAGES/DRESSINGS) ×2
ELECT REM PT RETURN 9FT ADLT (ELECTROSURGICAL) ×4
ELECTRODE REM PT RTRN 9FT ADLT (ELECTROSURGICAL) ×6 IMPLANT
FELT TEFLON 1X6 (MISCELLANEOUS) ×6 IMPLANT
GAUZE 4X4 16PLY ~~LOC~~+RFID DBL (SPONGE) ×3 IMPLANT
GAUZE SPONGE 4X4 12PLY STRL (GAUZE/BANDAGES/DRESSINGS) ×6 IMPLANT
GLOVE BIO SURGEON STRL SZ7 (GLOVE) ×6 IMPLANT
GLOVE BIOGEL PI IND STRL 7.5 (GLOVE) ×6 IMPLANT
GOWN STRL REUS W/ TWL LRG LVL3 (GOWN DISPOSABLE) ×12 IMPLANT
GOWN STRL REUS W/ TWL XL LVL3 (GOWN DISPOSABLE) ×6 IMPLANT
GOWN STRL REUS W/TWL LRG LVL3 (GOWN DISPOSABLE) ×12
GOWN STRL REUS W/TWL XL LVL3 (GOWN DISPOSABLE) ×4
HEMOSTAT POWDER SURGIFOAM 1G (HEMOSTASIS) ×9 IMPLANT
INSERT SUTURE HOLDER (MISCELLANEOUS) ×3 IMPLANT
KIT BASIN OR (CUSTOM PROCEDURE TRAY) ×3 IMPLANT
KIT PUMP PREVENA PLUS 14DAY (MISCELLANEOUS) IMPLANT
KIT SUCTION CATH 14FR (SUCTIONS) ×9 IMPLANT
KIT TURNOVER KIT B (KITS) ×3 IMPLANT
KIT VASOVIEW HEMOPRO 2 VH 4000 (KITS) ×3 IMPLANT
LEAD PACING MYOCARDI (MISCELLANEOUS) ×3 IMPLANT
MARKER GRAFT CORONARY BYPASS (MISCELLANEOUS) ×9 IMPLANT
NS IRRIG 1000ML POUR BTL (IV SOLUTION) ×15 IMPLANT
OFFPUMP STABILIZER SUV (MISCELLANEOUS) ×3 IMPLANT
PACK E OPEN HEART (SUTURE) ×3 IMPLANT
PACK OPEN HEART (CUSTOM PROCEDURE TRAY) ×3 IMPLANT
PAD ARMBOARD 7.5X6 YLW CONV (MISCELLANEOUS) ×6 IMPLANT
PAD ELECT DEFIB RADIOL ZOLL (MISCELLANEOUS) ×3 IMPLANT
PENCIL BUTTON HOLSTER BLD 10FT (ELECTRODE) ×3 IMPLANT
POSITIONER ACROBAT-I OFFPUMP (MISCELLANEOUS) ×3 IMPLANT
POSITIONER HEAD DONUT 9IN (MISCELLANEOUS) ×3 IMPLANT
PUNCH AORTIC ROTATE 4.5MM 8IN (MISCELLANEOUS) IMPLANT
SPONGE T-LAP 18X18 ~~LOC~~+RFID (SPONGE) ×12 IMPLANT
SUPPORT HEART JANKE-BARRON (MISCELLANEOUS) ×3 IMPLANT
SUT BONE WAX W31G (SUTURE) ×3 IMPLANT
SUT MNCRL AB 3-0 PS2 18 (SUTURE) ×6 IMPLANT
SUT PDS AB 1 CTX 36 (SUTURE) ×6 IMPLANT
SUT PROLENE 3 0 SH DA (SUTURE) ×3 IMPLANT
SUT PROLENE 5 0 C 1 36 (SUTURE) ×9 IMPLANT
SUT PROLENE 7 0 BV 1 (SUTURE) IMPLANT
SUT PROLENE BLUE 7 0 (SUTURE) ×3 IMPLANT
SUT STEEL 6MS V (SUTURE) ×6 IMPLANT
SYSTEM SAHARA CHEST DRAIN ATS (WOUND CARE) ×3 IMPLANT
TAPE CLOTH SURG 4X10 WHT LF (GAUZE/BANDAGES/DRESSINGS) IMPLANT
TAPE PAPER 2X10 WHT MICROPORE (GAUZE/BANDAGES/DRESSINGS) IMPLANT
TAPES RETRACTO (MISCELLANEOUS) ×6 IMPLANT
TOWEL GREEN STERILE (TOWEL DISPOSABLE) ×3 IMPLANT
TOWEL GREEN STERILE FF (TOWEL DISPOSABLE) ×3 IMPLANT
TRAY FOLEY SLVR 16FR TEMP STAT (SET/KITS/TRAYS/PACK) ×3 IMPLANT
TUBING LAP HI FLOW INSUFFLATIO (TUBING) ×3 IMPLANT
UNDERPAD 30X36 HEAVY ABSORB (UNDERPADS AND DIAPERS) ×3 IMPLANT
WATER STERILE IRR 1000ML POUR (IV SOLUTION) ×6 IMPLANT

## 2022-11-13 NOTE — Op Note (Signed)
     [address]       [place], [address]             [phone]                                         11/13/2022    Patient:  James Torres Pre-Op Dx: Single-vessel coronary artery disease   Morbid obesity Post-op Dx: Same Procedure: Off pump CABG X 1, LIMA to LAD    Surgeon and Role:      * Ceciley Buist, Lucile Crater, MD - Primary    *B Stehler, PA-C- assisting Anesthesia  general EBL: 100 ml Blood Administration: None  Drains: 17 F blake drain:  R, L, mediastinal  Wires: None Counts: correct   Indications: 57yo male with ostial LAD disease. His heart function is preserved, and he has no significant valvular disease. We discussed the risks and benefits of surgical revascularization, and he is agreeable to proceed.   Findings: Heavily calcified LAD.  Good LIMA  Operative Technique: After the risks, benefits and alternatives were thoroughly discussed, the patient was brought to the operative theatre.  All invasive lines were placed in pre-op holding.  Anesthesia was induced, and the patient was prepped and draped in normal sterile fashion.  An appropriate surgical pause was performed, and pre-operative antibiotics were dosed accordingly.  We began with an incision along the chest for the sternotomy.  In regards to the sternotomy, this was carried down with bovie cautery, and the sternum was divided with a reciprocating saw.  Meticulous hemostasis was obtained.  The left internal thoracic artery was exposed and harvested in in pedicled fashion.  The patient was systemically heparinized, and the artery was divided distally, and placed in a papaverine sponge.    The sternal elevator was removed, and a retractor was placed.  The pericardium was divided in the midline and fashioned into a cradle with pericardial stitches.  Next, we exposed a good target on the LAD, and fashioned an end to side anastomosis between it and the LITA.  Meticulous hemostasis was  obtained.    The heparin was reversed with protamine.  Chest tubes and wires were placed, and the sternum was re-approximated with with sternal wires.  The soft tissue and skin were re-approximated wth absorbable suture.    The patient tolerated the procedure without any immediate complications, and was transferred to the ICU in guarded condition.  James Torres

## 2022-11-13 NOTE — Anesthesia Procedure Notes (Signed)
Arterial Line Insertion Start/End1/07/2023 11:30 AM, 11/13/2022 11:40 AM Performed by: Valda Favia, CRNA, CRNA  Patient location: Pre-op. Preanesthetic checklist: patient identified, IV checked, site marked, risks and benefits discussed, surgical consent, monitors and equipment checked, pre-op evaluation, timeout performed and anesthesia consent Lidocaine 1% used for infiltration Left, radial was placed Catheter size: 20 G Hand hygiene performed  and maximum sterile barriers used  Allen's test indicative of satisfactory collateral circulation Attempts: 1 Procedure performed without using ultrasound guided technique. Following insertion, dressing applied and Biopatch. Post procedure assessment: normal  Patient tolerated the procedure well with no immediate complications.

## 2022-11-13 NOTE — Progress Notes (Signed)
CARDIAC REHAB PHASE I   Did pre-op ed with pt and spouse. Discussed mobility post-op and sitting in recliner, IS use, and sternal precautions. Pt and spouse voiced understanding. Pt in bed w/ MD discussing procedure. Will continue to monitor.   [place], MS, ACSM-CEP 11/13/2022 8:08 AM

## 2022-11-13 NOTE — Hospital Course (Addendum)
History of Present Illness:    James Torres is a 57 year old gentleman with a past history of coronary artery disease having undergone PTCI of the LAD in 2016.  This was after presenting with myocardial infarction.  He has a history of ischemic cardiomyopathy with EF of 30 to 35% and subsequent recovery EF of 45 to 50%.  He also has a past history of dyslipidemia, hypertension, and status post gamma knife therapy for a benign epidermoid right cyst in 2019.  He has a seizure disorder and is managed with 3 antiseizure medications. James Torres was brought to the emergency room by EMS on 11/10/2022 after onset of chest pain at home.  He took 2 nitroglycerin sublingually with relief of symptoms.  In the emergency room, EKG showed mild ST segment depressions inferiorly and nonspecific ST-T wave changes inferior leads.  Initial troponin was 177 and later rose to a peak of 4400.  Chest x-ray showed some minor haziness in the left lateral lung base but was otherwise unremarkable. Chest pain had resolved in the emergency room. He was treated with standard therapy for acute non-ST elevation myocardial infarction including aspirin, beta-blocker, statin, and IV heparin he was admitted to the hospital.  Left heart catheterization earlier today demonstrated a discrete 99% stenosis of the proximal LAD.  The LAD stent has some irregularities with maximal  20% stenosis.  The CFX and RCA had no occlusive disease.  Dr. Gwenlyn Found did not feel this very proximal LAD stenosis occurring after the bifurcation of the left main coronary was suitable for PCI. Dr. Gwenlyn Found asked that Mr. Sanfilippo be considered for coronary bypass grafting with left internal mammary artery to the left anterior descending coronary  artery.   Currently, James Torres is resting in bed with a friend at the bedside.  He denies pain since his initial presentation to the ED. He has been declared disabled since his brain surgery in 2019 to resect a benign epidermoid cyst.  He  said he has fairly frequent petit mal seizures subsequent to the brain surgery. His mobility is limited by left hip arthritis.  He has been told he needs hip replacement surgery but was encouraged to loose weight before doing so.  Dr. [name] reviewed the patient's studies and determined surgical revascularization would provide this patient the best long term treatment. Dr. [name] reviewed the patient's treatment options as well as the risks and benefits of surgery. James Torres was agreeable to proceed with surgery.  Hospital Course: [name] arrived at [facility] and was brought to the operating room on 11/13/22. He underwent off pump CABG x 1 utilizing LIMA to LAD. He tolerated the procedure well and was transferred to the SICU in stable condition. He self extubated the morning after surgery and was started on Bipap. His arterial line was removed without complication. He did not require inotropic support and was off all drips on POD1. Beta blocker was started and titrated as able. He was volume overload and diuresed accordingly. BIPAP was discontinued and he was saturating okay on 15L HFNC. Chest tube was removed without complication. Follow up CXR showed a stable left pleural effusion and right basilar atelectasis. Could no longer pull from the central line so it was removed. PT/OT was consulted and recommended home health PT/OT, this recommendation was changed to inpatient rehab the following day which was arranged accordingly. He remained volume overload and was aggressively diuresed. He felt his CPAP was not working well so BiPAP was used over night.  Cefepime was started per critical care team due to possible pneumonia. Oxygen was able to be weaned to 2L [place] on POD6. He was felt stable for transfer to the progressive unit. His Prevena was removed and sternal incision was healing well without sign of erythema or infection. He was surgically stable for transfer and went to 4E on 01/16. He was  weaned to room air and had good oxygenation. He was given a laxative to assist with bowel movement. Cefepime was stopped on 01/17. Ge was transitioned from IV diuresis to oral diuresis on 01/18. Ec asa was decreased to 81 mg daily and he was started on Plavix on 01/17 (NSTEMI). He has a history of migraines and had later in his post op course. Per patient, he takes Percocet for these as no other medication seems to help. Oxy was stopped and Percocet PRN ordered. Patient is surgically stable for discharge to CIR.

## 2022-11-13 NOTE — Progress Notes (Signed)
RN obtained ABG pt Pao2 108 on 100% and 8 of peep. RT weaned pt FIO2 down to 70% at this time.

## 2022-11-13 NOTE — Progress Notes (Signed)
  Echocardiogram Echocardiogram Transesophageal has been performed.  Bobbye Charleston 11/13/2022, 2:22 PM

## 2022-11-13 NOTE — Anesthesia Procedure Notes (Signed)
Procedure Name: Intubation Date/Time: 11/13/2022 1:57 PM  Performed by: Barrington Ellison, CRNAPre-anesthesia Checklist: Patient identified, Emergency Drugs available, Suction available and Patient being monitored Patient Re-evaluated:Patient Re-evaluated prior to induction Oxygen Delivery Method: Circle System Utilized Preoxygenation: Pre-oxygenation with 100% oxygen Induction Type: IV induction Ventilation: Mask ventilation without difficulty and Two handed mask ventilation required Laryngoscope Size: Mac and 4 Grade View: Grade I Tube type: Oral Tube size: 7.5 mm Number of attempts: 1 Airway Equipment and Method: Stylet and Oral airway Placement Confirmation: ETT inserted through vocal cords under direct vision, positive ETCO2 and breath sounds checked- equal and bilateral Secured at: 24 cm Tube secured with: Tape Dental Injury: Teeth and Oropharynx as per pre-operative assessment

## 2022-11-13 NOTE — Brief Op Note (Signed)
11/10/2022 - 11/13/2022  7:34 AM  PATIENT:  James Torres  57 y.o. male  PRE-OPERATIVE DIAGNOSIS:  coronary artery disease  POST-OPERATIVE DIAGNOSIS:  coronary artery disease  PROCEDURE:  Procedure(s):  OFF PUMP CORONARY ARTERY BYPASS GRAFTING x 1 -LIMA to LAD  TRANSESOPHAGEAL ECHOCARDIOGRAM (TEE) (N/A) Vein harvest time: N/A min Vein prep time: N/A min  SURGEON:  Surgeon(s) and Role:    * Lightfoot, Lucile Crater, MD - Primary  PHYSICIAN ASSISTANT: Wynelle Beckmann PA-C  ASSISTANTS: Lurlean Nanny RNFA   ANESTHESIA:   general  EBL:  100 mL   BLOOD ADMINISTERED:none  DRAINS:  Left Pleural Chest Tube, Mediastinal Chest Drain    LOCAL MEDICATIONS USED:  NONE  SPECIMEN:  No Specimen  DISPOSITION OF SPECIMEN:  N/A  COUNTS:  YES  TOURNIQUET:  * No tourniquets in log *  DICTATION: .Dragon Dictation  PLAN OF CARE: Admit to inpatient   PATIENT DISPOSITION:  ICU - intubated and hemodynamically stable.   Delay start of Pharmacological VTE agent (>24hrs) due to surgical blood loss or risk of bleeding: yes

## 2022-11-13 NOTE — Consult Note (Signed)
NAME:  James Torres, MRN:  [id number], DOB:  06/29/1966, LOS: 3 ADMISSION DATE:  11/10/2022, CONSULTATION DATE:  1/9 REFERRING MD:  Dr. Stanford Breed, CHIEF COMPLAINT:  cabg x1   History of Present Illness:  Patient is a 57 yo male w/ pertinent pmh of CAD, ischemic cardiomyopathy, HLD, HTN, seizure on aeds presents to [facility] on 1/6 with chest pain.  1/6 had acute onset chest pain came to [facility] ED for eval. EKG w/ inferior ST depression. Trop 177 then 4400. Treated w/ asa, BB, statin, heparin iv. Cards following. Left heart cath showing 99% stenosis proximal LAD. TCTS consulted for potential CABG. On 1/9 CABG x1 performed. Post intubated. PCCM consulted.  Pertinent  Medical History   Past Medical History:  Diagnosis Date   Anxiety    Brain tumor ([place])    CAD (coronary artery disease)    a. 03/2015 NSTEMI: LM nl, LAD 50ost, 100p (2.75x38 Synergy DES), 75d, RI nl, LCX minor irregs, OM1 min irregs, RCA 50ost, EF 35-45%.   Depression    GERD (gastroesophageal reflux disease)    Hyperlipidemia    Ischemic cardiomyopathy    a. 03/2015 EF 35-45% by LV gram @ time of NSTEMI;  b. 03/2015 Echo: EF 50-55%, sev HK to DK of dist an, apical, and infap walls.Gr 1 DD.   Kidney stone    Metabolic syndrome 5/40/0867   Migraines    Morbid obesity ([place])    Pre-diabetes    Seizures ([place])    a. Has auras and metalic taste in mouth.  Last one 06/2014- last a few seconds, has one every couple weeks, Dr Mare Loan is neurologist and is aware.    Sleep apnea 2009   a. On CPAP.   Tobacco abuse      Significant Hospital Events: Including procedures, antibiotic start and stop dates in addition to other pertinent events   1/9 cabg x 1; post op intubated; pccm consulted  Interim History / Subjective:  See above  Objective   Blood pressure 126/68, pulse 74, temperature 98.5 F (36.9 C), temperature source Oral, resp. rate 18, height 6' (1.829 m), weight (!) 172.8 kg, SpO2 91 %.    Vent Mode: SIMV;PSV;PRVC FiO2  (%):  [100 %] 100 % Set Rate:  [15 bmp] 15 bmp Vt Set:  [620 mL] 620 mL PEEP:  [5 cmH20] 5 cmH20 Pressure Support:  [10 cmH20] 10 cmH20   Intake/Output Summary (Last 24 hours) at 11/13/2022 1740 Last data filed at 11/13/2022 1640 Gross per 24 hour  Intake 2138.26 ml  Output 1075 ml  Net 1063.26 ml   Filed Weights   11/10/22 0841 11/12/22 0431  Weight: (!) 179.2 kg (!) 172.8 kg    Examination: General:  critically ill appearing on mech vent HEENT: MM pink/moist; ETT in place Neuro: sedate CV: s1s2, rate 50s, no m/r/g; CT in place PULM:  dim clear BS bilaterally; on mech vent PRVC GI: soft, bsx4 active  Extremities: warm/dry, trace ble edema  Skin: no rashes or lesions    Resolved Hospital Problem list     Assessment & Plan:  Post op vent management Plan: - Continue full vent support (4-8cc/kg IBW) - Wean FiO2 for O2 sat > 90% - Daily WUA/SBT, rapid wean with SIMV per protocol - VAP bundle - Pulmonary hygiene - F/u ABG and CXR - PAD protocol for sedation: Precedex for goal RASS 0 to -1   S/p 1-vessel CABG CAD Ischemic cardiomyopathy: ef 40-45% HTN HLD Plan: - Postoperative  care per TCTS - CT management per protocol - wean neo for map goal >65 - cont ancef/vanc for surgical ppx - ASA and statin - wean insulin gtt per protocol - pt, ptt, inr, cbc, bmp pending  Hx of seizure disorder Plan: -cont lamictal, keppra, and perampanel  Prediabetes Plan: -insulin gtt per protocol -cbg monitoring  Migraines Plan: -prn tylenol   Best Practice (right click and "Reselect all SmartList Selections" daily)   Diet/type: NPO w/ meds via tube DVT prophylaxis: SCD GI prophylaxis: H2B Lines: Central line and Arterial Line Foley:  Yes, and it is still needed Code Status:  full code Last date of multidisciplinary goals of care discussion [per primary]  Labs   CBC: Recent Labs  Lab 11/10/22 0832 11/10/22 0850 11/11/22 0046 11/12/22 0543 11/13/22 0554  11/13/22 1421 11/13/22 1510 11/13/22 1530 11/13/22 1533 11/13/22 1602 11/13/22 1719  WBC 6.6  --  8.5 7.3 7.4  --   --   --   --   --   --   NEUTROABS 3.9  --   --   --   --   --   --   --   --   --   --   HGB 15.0   < > 13.8 14.4 14.6   < > 13.6 13.6 14.3 13.9 14.3  HCT 46.1   < > 42.5 44.4 42.8   < > 40.0 40.0 42.0 41.0 42.0  MCV 95.8  --  93.8 94.9 92.6  --   --   --   --   --   --   PLT 155  --  159 164 156  --   --   --   --   --   --    < > = values in this interval not displayed.    Basic Metabolic Panel: Recent Labs  Lab 11/10/22 0832 11/10/22 0850 11/10/22 1705 11/11/22 0046 11/12/22 0543 11/13/22 0554 11/13/22 1421 11/13/22 1425 11/13/22 1510 11/13/22 1530 11/13/22 1533 11/13/22 1602 11/13/22 1719  NA 139   < >  --  135 136 137   < > 140 139 136 137 139 137  K 4.3   < >  --  3.6 3.6 4.2   < > 3.8 4.6 6.1* 5.9* 4.2 4.6  CL 106   < >  --  102 101 102  --  104 103  --  104 104  --   CO2 22  --   --  '25 26 25  '$ --   --   --   --   --   --   --   GLUCOSE 173*   < >  --  99 100* 96  --  93 96  --  94 97  --   BUN 9   < >  --  '10 11 9  '$ --  9 11  --  13 9  --   CREATININE 1.00   < >  --  0.89 1.02 1.01  --  0.70 0.70  --  0.90 0.80  --   CALCIUM 9.0  --   --  9.0 8.8* 9.2  --   --   --   --   --   --   --   MG  --   --  2.0  --   --   --   --   --   --   --   --   --   --    < > =  values in this interval not displayed.   GFR: Estimated Creatinine Clearance: 164.7 mL/min (by C-G formula based on SCr of 0.8 mg/dL). Recent Labs  Lab 11/10/22 0832 11/11/22 0046 11/12/22 0543 11/13/22 0554  WBC 6.6 8.5 7.3 7.4    Liver Function Tests: Recent Labs  Lab 11/10/22 0832  AST 33  ALT 26  ALKPHOS 74  BILITOT 0.5  PROT 6.6  ALBUMIN 3.7   No results for input(s): "LIPASE", "AMYLASE" in the last 168 hours. No results for input(s): "AMMONIA" in the last 168 hours.  ABG    Component Value Date/Time   PHART 7.328 (L) 11/13/2022 1719   PCO2ART 46.8 11/13/2022  1719   PO2ART 58 (L) 11/13/2022 1719   HCO3 25.3 11/13/2022 1719   TCO2 27 11/13/2022 1719   ACIDBASEDEF 2.0 11/13/2022 1719   O2SAT 91 11/13/2022 1719     Coagulation Profile: Recent Labs  Lab 11/10/22 0832  INR 0.9    Cardiac Enzymes: No results for input(s): "CKTOTAL", "CKMB", "CKMBINDEX", "TROPONINI" in the last 168 hours.  HbA1C: Hgb A1c MFr Bld  Date/Time Value Ref Range Status  11/10/2022 08:32 AM 6.1 (H) 4.8 - 5.6 % Final    Comment:    (NOTE)         Prediabetes: 5.7 - 6.4         Diabetes: >6.4         Glycemic control for adults with diabetes: <7.0   03/22/2015 04:05 AM 6.1 (H) 4.8 - 5.6 % Final    Comment:    (NOTE)         Pre-diabetes: 5.7 - 6.4         Diabetes: >6.4         Glycemic control for adults with diabetes: <7.0     CBG: No results for input(s): "GLUCAP" in the last 168 hours.  Review of Systems:   Patient is encephalopathic and/or intubated. Therefore history has been obtained from chart review.    Past Medical History:  He,  has a past medical history of Anxiety, Brain tumor ([place]), CAD (coronary artery disease), Depression, GERD (gastroesophageal reflux disease), Hyperlipidemia, Ischemic cardiomyopathy, Kidney stone, Metabolic syndrome (9/89/2119), Migraines, Morbid obesity ([place]), Pre-diabetes, Seizures ([place]), Sleep apnea (2009), and Tobacco abuse.   Surgical History:   Past Surgical History:  Procedure Laterality Date   BRAIN SURGERY  2004   Crainotomy for tumor   CARDIAC CATHETERIZATION N/A 03/21/2015   Procedure: Left Heart Cath and Coronary Angiography;  Surgeon: Sherren Mocha, MD;  Location: [place] [facility];  Service: Cardiovascular;  Laterality: N/A;   KNEE ARTHROSCOPY Right 2005 approx   cartliage   LEFT HEART CATH AND CORONARY ANGIOGRAPHY N/A 11/12/2022   Procedure: LEFT HEART CATH AND CORONARY ANGIOGRAPHY;  Surgeon: Lorretta Harp, MD;  Location: [facility];  Service: Cardiovascular;  Laterality: N/A;   ULNAR  NERVE TRANSPOSITION Left 06/25/2014   Procedure: LEFT ULNAR NEUROPLASTY AT ELBOW;  Surgeon: Jolyn Nap, MD;  Location: [facility];  Service: Orthopedics;  Laterality: Left;   WISDOM TOOTH EXTRACTION       Social History:   reports that he has been smoking cigarettes. He has a 10.00 pack-year smoking history. He has never used smokeless tobacco. He reports that he does not drink alcohol and does not use drugs.   Family History:  His family history includes Healthy in his mother, sister, and sister; Heart disease in his father; Stroke in his father. There is no history  of Heart attack.   Allergies Allergies  Allergen Reactions   Zithromax [Azithromycin] Hives     Home Medications  Prior to Admission medications   Medication Sig Start Date End Date Taking? Authorizing Provider  acetaminophen (TYLENOL) 500 MG tablet Take 1,000 mg by mouth every 6 (six) hours as needed for mild pain.   Yes [provider]  aspirin EC 81 MG EC tablet Take 1 tablet (81 mg total) by mouth daily. 03/23/15  Yes Isaiah Serge, NP  CALCIUM CARBONATE-VITAMIN D PO Take 1 tablet by mouth daily with breakfast.   Yes [provider]  carvedilol (COREG) 6.25 MG tablet Take 1 tablet by mouth in the morning and at bedtime. Patient taking differently: Take 6.25 mg by mouth 2 (two) times daily with a meal. 03/01/22  Yes Marylu Lund., NP  Cholecalciferol (VITAMIN D) 125 MCG (5000 UT) CAPS Take 5,000 Units by mouth daily.   Yes [provider]  citalopram (CELEXA) 40 MG tablet Take 40 mg by mouth every morning.   Yes [provider]  clonazePAM (KLONOPIN) 0.5 MG tablet Take 0.25-0.5 mg by mouth 2 (two) times daily.   Yes [provider]  famotidine (PEPCID) 20 MG tablet TAKE 1 TABLET BY MOUTH EVERY DAY Patient taking differently: Take 20 mg by mouth at bedtime. 05/15/22  Yes Marylu Lund., NP  Fremanezumab-vfrm (AJOVY) 225 MG/1.5ML SOAJ Inject 1 Dose into the skin every 30  (thirty) days.   Yes [provider]  ibuprofen (ADVIL) 600 MG tablet Take 600 mg by mouth 2 (two) times daily as needed for mild pain. 10/09/22  Yes [provider]  lamoTRIgine (LAMICTAL) 100 MG tablet Take 200 mg by mouth in the morning and at bedtime.   Yes [provider]  levETIRAcetam (KEPPRA XR) 500 MG 24 hr tablet Take 1,000 mg by mouth 2 (two) times daily.   Yes [provider]  lisinopril (ZESTRIL) 2.5 MG tablet Take 1 tablet (2.5 mg total) by mouth daily. 03/01/22  Yes Marylu Lund., NP  Melatonin 5 MG CHEW Chew 15-20 mg by mouth at bedtime.   Yes [provider]  nitroGLYCERIN (NITROSTAT) 0.4 MG SL tablet Place 1 tablet (0.4 mg total) under the tongue every 5 (five) minutes as needed for chest pain (x 3 pills daily). 03/01/22  Yes Marylu Lund., NP  oxyCODONE-acetaminophen (ROXICET) 5-325 MG per tablet Take 1-2 tablets by mouth every 4 (four) hours as needed for severe pain. 03/23/15  Yes Isaiah Serge, NP  pantoprazole (PROTONIX) 40 MG tablet Take 2 tablets (80 mg total) by mouth daily. 03/01/22  Yes Marylu Lund., NP  perampanel [facility]) 8 MG tablet Take 1 tablet by mouth daily. 01/12/22  Yes [provider]  rosuvastatin (CRESTOR) 40 MG tablet Take 1 tablet (40 mg total) by mouth daily. 03/01/22  Yes Marylu Lund., NP  Ubrogepant (UBRELVY) 100 MG TABS Take 1-2 tablets by mouth daily as needed (HEADACHE).   Yes [provider]     Critical care time: 45 minutes    JD Rexene Agent [facility] 11/13/2022, 5:41 PM  Please see [email or web address] for pager details.  From 7A-7P if no response, please call [phone]. After hours, please call ELink [phone].

## 2022-11-13 NOTE — Anesthesia Procedure Notes (Signed)
Central Venous Catheter Insertion Performed by: Duane Boston, MD, anesthesiologist Start/End1/07/2023 1:11 PM, 11/13/2022 1:21 PM Patient location: Pre-op. Preanesthetic checklist: patient identified, IV checked, site marked, risks and benefits discussed, surgical consent, monitors and equipment checked, pre-op evaluation, timeout performed and anesthesia consent Lidocaine 1% used for infiltration and patient sedated Hand hygiene performed  and maximum sterile barriers used  Catheter size: 8.5 Fr Sheath introducer Procedure performed using ultrasound guided technique. Ultrasound Notes:anatomy identified, needle tip was noted to be adjacent to the nerve/plexus identified, no ultrasound evidence of intravascular and/or intraneural injection and image(s) printed for medical record Attempts: 1 Following insertion, line sutured and dressing applied. Post procedure assessment: blood return through all ports, free fluid flow and no air  Patient tolerated the procedure well with no immediate complications.

## 2022-11-13 NOTE — Progress Notes (Signed)
     [address]       [place] [address]             [phone]       No events  Vitals:   11/13/22 0014 11/13/22 0310  BP: (!) 145/67 122/63  Pulse: 76   Resp: 18   Temp: 97.9 F (36.6 C) 97.8 F (36.6 C)  SpO2:  90%   Alert NAD Sinus EWOB  OR today for CABG 1  [name]

## 2022-11-13 NOTE — Transfer of Care (Signed)
Immediate Anesthesia Transfer of Care Note  Patient: James Torres  Procedure(s) Performed: OFF PUMP CORONARY ARTERY BYPASS GRAFTING (CABG) X ONE BYPASS USING LEFT INTERNAL MAMMARY ARTERY. (Chest) TRANSESOPHAGEAL ECHOCARDIOGRAM (TEE)  Patient Location: ICU  Anesthesia Type:General  Level of Consciousness: Patient remains intubated per anesthesia plan  Airway & Oxygen Therapy: Patient remains intubated per anesthesia plan and Patient placed on Ventilator (see vital sign flow sheet for setting)  Post-op Assessment: Report given to RN  Post vital signs: Reviewed and stable  Last Vitals:  Vitals Value Taken Time  BP 117/61   Temp    Pulse 59   Resp 16   SpO2 91 % 11/13/22 1708    Last Pain:  Vitals:   11/13/22 1113  TempSrc: Oral  PainSc: 0-No pain         Complications: No notable events documented.

## 2022-11-13 NOTE — Anesthesia Postprocedure Evaluation (Signed)
Anesthesia Post Note  Patient: James Torres  Procedure(s) Performed: OFF PUMP CORONARY ARTERY BYPASS GRAFTING (CABG) X ONE BYPASS USING LEFT INTERNAL MAMMARY ARTERY. (Chest) TRANSESOPHAGEAL ECHOCARDIOGRAM (TEE)     Patient location during evaluation: SICU Anesthesia Type: General Level of consciousness: sedated Pain management: pain level controlled Vital Signs Assessment: post-procedure vital signs reviewed and stable Respiratory status: patient remains intubated per anesthesia plan Cardiovascular status: stable Postop Assessment: no apparent nausea or vomiting Anesthetic complications: no  No notable events documented.  Last Vitals:  Vitals:   11/13/22 1745 11/13/22 1900  BP:  (!) 96/55  Pulse: (!) 55 61  Resp: 18 16  Temp:  (!) 35.4 C  SpO2: 97% 96%    Last Pain:  Vitals:   11/13/22 1113  TempSrc: Oral  PainSc: 0-No pain                 Charliee Krenz DANIEL

## 2022-11-13 NOTE — Progress Notes (Signed)
ANTICOAGULATION CONSULT NOTE - Follow up [place] for Heparin Indication: chest pain/ACS  Allergies  Allergen Reactions   Zithromax [Azithromycin] Hives    Patient Measurements: Height: 6' (182.9 cm) Weight: (!) 172.8 kg (381 lb) IBW/kg (Calculated) : 77.6 Heparin Dosing Weight: 121.7 kg  Vital Signs: Temp: 97.8 F (36.6 C) (01/09 0310) Temp Source: Oral (01/09 0310) BP: 122/63 (01/09 0310) Pulse Rate: 76 (01/09 0014)  Labs: Recent Labs    11/10/22 0832 11/10/22 0850 11/10/22 1705 11/10/22 1937 11/11/22 0046 11/11/22 0949 11/11/22 1139 11/11/22 1413 11/11/22 2055 11/12/22 0543 11/13/22 0554  HGB 15.0   < >  --   --  13.8  --   --   --   --  14.4 14.6  HCT 46.1   < >  --   --  42.5  --   --   --   --  44.4 42.8  PLT 155  --   --   --  159  --   --   --   --  164 156  APTT 29  --   --   --   --   --   --   --   --   --   --   LABPROT 12.3  --   --   --   --   --   --   --   --   --   --   INR 0.9  --   --   --   --   --   --   --   --   --   --   HEPARINUNFRC  --   --   --    < > 0.34  --   --    < > 0.29* 0.69 0.35  CREATININE 1.00   < >  --   --  0.89  --   --   --   --  1.02 1.01  TROPONINIHS 177*   < > 4,401*  --   --  2,441* 2,566*  --   --   --   --    < > = values in this interval not displayed.     Estimated Creatinine Clearance: 130.5 mL/min (by C-G formula based on SCr of 1.01 mg/dL).   Medical History: Past Medical History:  Diagnosis Date   Anxiety    Brain tumor ([place])    CAD (coronary artery disease)    a. 03/2015 NSTEMI: LM nl, LAD 50ost, 100p (2.75x38 Synergy DES), 75d, RI nl, LCX minor irregs, OM1 min irregs, RCA 50ost, EF 35-45%.   Depression    GERD (gastroesophageal reflux disease)    Hyperlipidemia    Ischemic cardiomyopathy    a. 03/2015 EF 35-45% by LV gram @ time of NSTEMI;  b. 03/2015 Echo: EF 50-55%, sev HK to DK of dist an, apical, and infap walls.Gr 1 DD.   Kidney stone    Metabolic syndrome 1/61/0960    Migraines    Morbid obesity ([place])    Pre-diabetes    Seizures ([place])    a. Has auras and metalic taste in mouth.  Last one 06/2014- last a few seconds, has one every couple weeks, Dr Mare Loan is neurologist and is aware.    Sleep apnea 2009   a. On CPAP.   Tobacco abuse      Assessment: 57 yo m with a history of CAD, NSTEMI. Patient is presenting with chest pain.  Patient is not on anticoagulation prior to arrival. Heparin per pharmacy consult placed for chest pain/ACS.  Heparin level 0.35 is therapeutic on 1950 units/hr. CBC stable. Planning CABG 1/9.  Goal of Therapy:  Heparin level 0.3-0.7 units/ml Monitor platelets by anticoagulation protocol: Yes   Plan:  Continue heparin infusion 1950 units/hr Daily heparin level and CBC Monitor s/sx of bleeding  F/u CABG    Benetta Spar, PharmD, BCPS, BCCP Clinical Pharmacist  Please check AMION for all [place] phone numbers After 10:00 PM, call [place] [phone]

## 2022-11-14 ENCOUNTER — Inpatient Hospital Stay (HOSPITAL_COMMUNITY): Payer: Medicare PPO

## 2022-11-14 ENCOUNTER — Encounter (HOSPITAL_COMMUNITY): Payer: Self-pay | Admitting: Thoracic Surgery (Cardiothoracic Vascular Surgery)

## 2022-11-14 DIAGNOSIS — I214 Non-ST elevation (NSTEMI) myocardial infarction: Secondary | ICD-10-CM | POA: Diagnosis not present

## 2022-11-14 DIAGNOSIS — J9601 Acute respiratory failure with hypoxia: Secondary | ICD-10-CM

## 2022-11-14 LAB — BASIC METABOLIC PANEL
Anion gap: 7 (ref 5–15)
Anion gap: 11 (ref 5–15)
Anion gap: 9 (ref 5–15)
BUN: 10 mg/dL (ref 6–20)
BUN: 10 mg/dL (ref 6–20)
BUN: 10 mg/dL (ref 6–20)
CO2: 23 mmol/L (ref 22–32)
CO2: 19 mmol/L — ABNORMAL LOW (ref 22–32)
CO2: 22 mmol/L (ref 22–32)
Calcium: 8.2 mg/dL — ABNORMAL LOW (ref 8.9–10.3)
Calcium: 7.8 mg/dL — ABNORMAL LOW (ref 8.9–10.3)
Calcium: 7.9 mg/dL — ABNORMAL LOW (ref 8.9–10.3)
Chloride: 105 mmol/L (ref 98–111)
Chloride: 103 mmol/L (ref 98–111)
Chloride: 104 mmol/L (ref 98–111)
Creatinine, Ser: 1.13 mg/dL (ref 0.61–1.24)
Creatinine, Ser: 0.92 mg/dL (ref 0.61–1.24)
Creatinine, Ser: 0.9 mg/dL (ref 0.61–1.24)
GFR, Estimated: >60 mL/min (ref >60)
GFR, Estimated: >60 mL/min (ref >60)
GFR, Estimated: >60 mL/min (ref >60)
Glucose, Bld: 133 mg/dL — ABNORMAL HIGH (ref 70–99)
Glucose, Bld: 128 mg/dL — ABNORMAL HIGH (ref 70–99)
Glucose, Bld: 134 mg/dL — ABNORMAL HIGH (ref 70–99)
Potassium: 4.1 mmol/L (ref 3.5–5.1)
Potassium: 3.9 mmol/L (ref 3.5–5.1)
Potassium: 3.7 mmol/L (ref 3.5–5.1)
Sodium: 135 mmol/L (ref 135–145)
Sodium: 133 mmol/L — ABNORMAL LOW (ref 135–145)
Sodium: 135 mmol/L (ref 135–145)

## 2022-11-14 LAB — CBC
HCT: 41.3 % (ref 39.0–52.0)
HCT: 42.9 % (ref 39.0–52.0)
Hemoglobin: 14.1 g/dL (ref 13.0–17.0)
Hemoglobin: 14.2 g/dL (ref 13.0–17.0)
MCH: 31.7 pg (ref 26.0–34.0)
MCH: 31.2 pg (ref 26.0–34.0)
MCHC: 34.1 g/dL (ref 30.0–36.0)
MCHC: 33.1 g/dL (ref 30.0–36.0)
MCV: 92.8 fL (ref 80.0–100.0)
MCV: 94.3 fL (ref 80.0–100.0)
Platelets: 140 10*3/uL — ABNORMAL LOW (ref 150–400)
Platelets: 129 10*3/uL — ABNORMAL LOW (ref 150–400)
RBC: 4.45 MIL/uL (ref 4.22–5.81)
RBC: 4.55 MIL/uL (ref 4.22–5.81)
RDW: 14.2 % (ref 11.5–15.5)
RDW: 14.5 % (ref 11.5–15.5)
WBC: 9.1 10*3/uL (ref 4.0–10.5)
WBC: 11.8 10*3/uL — ABNORMAL HIGH (ref 4.0–10.5)
nRBC: 0 % (ref 0.0–0.2)
nRBC: 0 % (ref 0.0–0.2)

## 2022-11-14 LAB — GLUCOSE, CAPILLARY
Glucose-Capillary: 143 mg/dL — ABNORMAL HIGH (ref 70–99)
Glucose-Capillary: 133 mg/dL — ABNORMAL HIGH (ref 70–99)
Glucose-Capillary: 138 mg/dL — ABNORMAL HIGH (ref 70–99)
Glucose-Capillary: 127 mg/dL — ABNORMAL HIGH (ref 70–99)
Glucose-Capillary: 144 mg/dL — ABNORMAL HIGH (ref 70–99)
Glucose-Capillary: 151 mg/dL — ABNORMAL HIGH (ref 70–99)
Glucose-Capillary: 122 mg/dL — ABNORMAL HIGH (ref 70–99)
Glucose-Capillary: 135 mg/dL — ABNORMAL HIGH (ref 70–99)
Glucose-Capillary: 147 mg/dL — ABNORMAL HIGH (ref 70–99)
Glucose-Capillary: 124 mg/dL — ABNORMAL HIGH (ref 70–99)
Glucose-Capillary: 148 mg/dL — ABNORMAL HIGH (ref 70–99)
Glucose-Capillary: 138 mg/dL — ABNORMAL HIGH (ref 70–99)

## 2022-11-14 LAB — POCT I-STAT 7, (LYTES, BLD GAS, ICA,H+H)
Acid-base deficit: 3 mmol/L — ABNORMAL HIGH (ref 0.0–2.0)
Bicarbonate: 21.8 mmol/L (ref 20.0–28.0)
Calcium, Ion: 1.16 mmol/L (ref 1.15–1.40)
HCT: 40 % (ref 39.0–52.0)
Hemoglobin: 13.6 g/dL (ref 13.0–17.0)
O2 Saturation: 93 %
Patient temperature: 37.1
Potassium: 4.2 mmol/L (ref 3.5–5.1)
Sodium: 137 mmol/L (ref 135–145)
TCO2: 23 mmol/L (ref 22–32)
pCO2 arterial: 37.7 mmHg (ref 32–48)
pH, Arterial: 7.372 (ref 7.35–7.45)
pO2, Arterial: 69 mmHg — ABNORMAL LOW (ref 83–108)

## 2022-11-14 LAB — MAGNESIUM
Magnesium: 2.8 mg/dL — ABNORMAL HIGH (ref 1.7–2.4)
Magnesium: 2.5 mg/dL — ABNORMAL HIGH (ref 1.7–2.4)
Magnesium: 2.3 mg/dL (ref 1.7–2.4)

## 2022-11-14 MED ORDER — INSULIN DETEMIR 100 UNIT/ML ~~LOC~~ SOLN
10.0000 [IU] | Freq: Every day | SUBCUTANEOUS | Status: DC
  Administered 2022-11-15 – 2022-11-20 (×6): 10 [IU] via SUBCUTANEOUS
  Filled 2022-11-14 (×6): qty 0.1

## 2022-11-14 MED ORDER — INSULIN ASPART 100 UNIT/ML IJ SOLN
0.0000 [IU] | INTRAMUSCULAR | Status: DC
  Administered 2022-11-14 – 2022-11-15 (×8): 2 [IU] via SUBCUTANEOUS
  Administered 2022-11-15: 8 [IU] via SUBCUTANEOUS
  Administered 2022-11-15 – 2022-11-16 (×5): 2 [IU] via SUBCUTANEOUS
  Administered 2022-11-16: 4 [IU] via SUBCUTANEOUS
  Administered 2022-11-17 (×2): 2 [IU] via SUBCUTANEOUS
  Administered 2022-11-17 (×2): 4 [IU] via SUBCUTANEOUS
  Administered 2022-11-17: 2 [IU] via SUBCUTANEOUS
  Administered 2022-11-18: 4 [IU] via SUBCUTANEOUS
  Administered 2022-11-18: 2 [IU] via SUBCUTANEOUS
  Administered 2022-11-18: 4 [IU] via SUBCUTANEOUS
  Administered 2022-11-18 – 2022-11-19 (×5): 2 [IU] via SUBCUTANEOUS

## 2022-11-14 MED ORDER — ORAL CARE MOUTH RINSE: 15.0000 mL | OROMUCOSAL | Status: DC | PRN

## 2022-11-14 MED ORDER — ORAL CARE MOUTH RINSE
15.0000 mL | OROMUCOSAL | Status: DC
  Administered 2022-11-14 – 2022-11-15 (×7): 15 mL via OROMUCOSAL

## 2022-11-14 MED ORDER — METOPROLOL TARTRATE 5 MG/5ML IV SOLN
2.5000 mg | Freq: Four times a day (QID) | INTRAVENOUS | Status: DC
  Administered 2022-11-14 – 2022-11-17 (×7): 2.5 mg via INTRAVENOUS
  Filled 2022-11-14 (×11): qty 5

## 2022-11-14 MED ORDER — LEVETIRACETAM ER 500 MG PO TB24
1000.0000 mg | ORAL_TABLET | Freq: Two times a day (BID) | ORAL | Status: DC
  Administered 2022-11-16 – 2022-11-23 (×15): 1000 mg via ORAL
  Filled 2022-11-14 (×20): qty 2

## 2022-11-14 MED ORDER — INSULIN DETEMIR 100 UNIT/ML ~~LOC~~ SOLN
10.0000 [IU] | Freq: Once | SUBCUTANEOUS | Status: AC
  Administered 2022-11-14: 10 [IU] via SUBCUTANEOUS
  Filled 2022-11-14: qty 0.1

## 2022-11-14 MED ORDER — POTASSIUM CHLORIDE 10 MEQ/50ML IV SOLN
10.0000 meq | INTRAVENOUS | Status: AC
  Administered 2022-11-14 – 2022-11-15 (×3): 10 meq via INTRAVENOUS
  Filled 2022-11-14: qty 50

## 2022-11-14 MED ORDER — LEVETIRACETAM IN NACL 1000 MG/100ML IV SOLN
1000.0000 mg | Freq: Two times a day (BID) | INTRAVENOUS | Status: DC
  Administered 2022-11-14 – 2022-11-15 (×3): 1000 mg via INTRAVENOUS
  Filled 2022-11-14 (×4): qty 100

## 2022-11-14 NOTE — Progress Notes (Signed)
NAME:  James Torres, MRN:  [id number], DOB:  1966/03/20, LOS: 4 ADMISSION DATE:  11/10/2022, CONSULTATION DATE:  1/9 REFERRING MD:  Dr. Stanford Breed, CHIEF COMPLAINT:  cabg x1   History of Present Illness:  Patient is a 57 yo male w/ pertinent pmh of CAD, ischemic cardiomyopathy, HLD, HTN, seizure on aeds presents to [facility] on 1/6 with chest pain.  1/6 had acute onset chest pain came to [facility] ED for eval. EKG w/ inferior ST depression. Trop 177 then 4400. Treated w/ asa, BB, statin, heparin iv. Cards following. Left heart cath showing 99% stenosis proximal LAD. TCTS consulted for potential CABG. On 1/9 CABG x1 performed. Post intubated. PCCM consulted.  Pertinent  Medical History   Past Medical History:  Diagnosis Date   Anxiety    Brain tumor ([place])    CAD (coronary artery disease)    a. 03/2015 NSTEMI: LM nl, LAD 50ost, 100p (2.75x38 Synergy DES), 75d, RI nl, LCX minor irregs, OM1 min irregs, RCA 50ost, EF 35-45%.   Depression    GERD (gastroesophageal reflux disease)    Hyperlipidemia    Ischemic cardiomyopathy    a. 03/2015 EF 35-45% by LV gram @ time of NSTEMI;  b. 03/2015 Echo: EF 50-55%, sev HK to DK of dist an, apical, and infap walls.Gr 1 DD.   Kidney stone    Metabolic syndrome 9/56/3875   Migraines    Morbid obesity ([place])    Pre-diabetes    Seizures ([place])    a. Has auras and metalic taste in mouth.  Last one 06/2014- last a few seconds, has one every couple weeks, Dr Mare Loan is neurologist and is aware.    Sleep apnea 2009   a. On CPAP.   Tobacco abuse      Significant Hospital Events: Including procedures, antibiotic start and stop dates in addition to other pertinent events   1/9 cabg x 1; post op intubated; pccm consulted 1/10: patient self extubated; placed on bipap 100%.  Interim History / Subjective:  Self extubated this am. Initially placed on NRB w/ sats 91%. Placed on bipap 100% fio2.  No significant respiratory distress  Objective   Blood pressure (!)  98/56, pulse 74, temperature 98.8 F (37.1 C), temperature source Oral, resp. rate 20, height 6' (1.829 m), weight (!) 174 kg, SpO2 93 %. CVP:  [1 mmHg-30 mmHg] 6 mmHg  Vent Mode: BIPAP;PSV FiO2 (%):  [50 %-100 %] 100 % Set Rate:  [15 bmp-18 bmp] 18 bmp Vt Set:  [620 mL] 620 mL PEEP:  [5 cmH20-8 cmH20] 8 cmH20 Pressure Support:  [10 cmH20] 10 cmH20 Plateau Pressure:  [19 cmH20-21 cmH20] 21 cmH20   Intake/Output Summary (Last 24 hours) at 11/14/2022 1000 Last data filed at 11/14/2022 0900 Gross per 24 hour  Intake 4236.29 ml  Output 2095 ml  Net 2141.29 ml    Filed Weights   11/10/22 0841 11/12/22 0431 11/14/22 0500  Weight: (!) 179.2 kg (!) 172.8 kg (!) 174 kg    Examination: General:  ill obese appearing male on bipap HEENT: MM pink/moist; bipap mask in place Neuro: aox3, mae CV: s1s2, RRR, no m/r/g; CT in place PULM:  dim clear BS bilaterally; on mech vent PRVC GI: soft, bsx4 active  Extremities: warm/dry, trace ble edema  Skin: no rashes or lesions    Resolved Hospital Problem list     Assessment & Plan:   Acute respiratory failure w/ hypoxia OSA on CPAP -self extubated 1/10 sats 91% on NRB  Plan: -continue bipap; wean fio2 for sats >92% -attempt to wean to [place] HFNC or heated hfnc later today -pulm toiletry: IS, PT, OT -cvp 7; hold on lasix   S/p 1-vessel CABG CAD Ischemic cardiomyopathy: ef 40-45% HTN HLD Plan: - Postoperative care per TCTS - CT management per protocol - monitor CT output - pain management - vanc stopped; cont ancef for surgical ppx - ASA and statin - transition on insulin gtt per protocol  Hx of seizure disorder Plan: -cont lamictal, keppra, and perampanel  Anxiety/depression -on chronic benzos Plan: -cont celexa and klonopin  Prediabetes Plan: -transition off insulin gtt per protocol -cbg monitoring  Chronic Migraines Plan: -prn tylenol  GERD Plan: -H2B   Best Practice (right click and "Reselect all SmartList  Selections" daily)   Diet/type: NPO w/ meds via tube DVT prophylaxis: SCD GI prophylaxis: H2B Lines: Central line and Arterial Line Foley:  Yes, and it is still needed Code Status:  full code Last date of multidisciplinary goals of care discussion [per primary]  Labs   CBC: Recent Labs  Lab 11/10/22 0832 11/10/22 0850 11/12/22 0543 11/13/22 0554 11/13/22 1421 11/13/22 1719 11/13/22 2010 11/13/22 2219 11/13/22 2322 11/13/22 2323 11/14/22 0358 11/14/22 0500  WBC 6.6   < > 7.3 7.4  --  8.5  --   --   --  8.4 9.1  --   NEUTROABS 3.9  --   --   --   --   --   --   --   --   --   --   --   HGB 15.0   < > 14.4 14.6   < > 14.2  14.3   < > 13.6 13.6 13.7 14.1 13.6  HCT 46.1   < > 44.4 42.8   < > 41.6  42.0   < > 40.0 40.0 40.8 41.3 40.0  MCV 95.8   < > 94.9 92.6  --  94.5  --   --   --  94.0 92.8  --   PLT 155   < > 164 156  --  133*  --   --   --  146* 140*  --    < > = values in this interval not displayed.     Basic Metabolic Panel: Recent Labs  Lab 11/10/22 1705 11/11/22 0046 11/12/22 0543 11/13/22 0554 11/13/22 1421 11/13/22 1510 11/13/22 1530 11/13/22 1533 11/13/22 1602 11/13/22 1719 11/13/22 2219 11/13/22 2322 11/13/22 2323 11/14/22 0358 11/14/22 0500  NA  --  135 136 137   < > 139   < > 137 139   < > 137 137 135 133* 137  K  --  3.6 3.6 4.2   < > 4.6   < > 5.9* 4.2   < > 4.4 4.3 4.1 3.9 4.2  CL  --  102 101 102   < > 103  --  104 104  --   --   --  105 103  --   CO2  --  '25 26 25  '$ --   --   --   --   --   --   --   --  23 19*  --   GLUCOSE  --  99 100* 96   < > 96  --  94 97  --   --   --  133* 128*  --   BUN  --  '10 11 9   '$ < > 11  --  13 9  --   --   --  10 10  --   CREATININE  --  0.89 1.02 1.01   < > 0.70  --  0.90 0.80  --   --   --  1.13 0.92  --   CALCIUM  --  9.0 8.8* 9.2  --   --   --   --   --   --   --   --  8.2* 7.8*  --   MG 2.0  --   --   --   --   --   --   --   --   --   --   --  2.8* 2.5*  --    < > = values in this interval not  displayed.    GFR: Estimated Creatinine Clearance: 143.8 mL/min (by C-G formula based on SCr of 0.92 mg/dL). Recent Labs  Lab 11/13/22 0554 11/13/22 1719 11/13/22 2323 11/14/22 0358  WBC 7.4 8.5 8.4 9.1     Liver Function Tests: Recent Labs  Lab 11/10/22 0832  AST 33  ALT 26  ALKPHOS 74  BILITOT 0.5  PROT 6.6  ALBUMIN 3.7    No results for input(s): "LIPASE", "AMYLASE" in the last 168 hours. No results for input(s): "AMMONIA" in the last 168 hours.  ABG    Component Value Date/Time   PHART 7.372 11/14/2022 0500   PCO2ART 37.7 11/14/2022 0500   PO2ART 69 (L) 11/14/2022 0500   HCO3 21.8 11/14/2022 0500   TCO2 23 11/14/2022 0500   ACIDBASEDEF 3.0 (H) 11/14/2022 0500   O2SAT 93 11/14/2022 0500     Coagulation Profile: Recent Labs  Lab 11/10/22 0832 11/13/22 1719  INR 0.9 1.2     Cardiac Enzymes: No results for input(s): "CKTOTAL", "CKMB", "CKMBINDEX", "TROPONINI" in the last 168 hours.  HbA1C: Hgb A1c MFr Bld  Date/Time Value Ref Range Status  11/10/2022 08:32 AM 6.1 (H) 4.8 - 5.6 % Final    Comment:    (NOTE)         Prediabetes: 5.7 - 6.4         Diabetes: >6.4         Glycemic control for adults with diabetes: <7.0   03/22/2015 04:05 AM 6.1 (H) 4.8 - 5.6 % Final    Comment:    (NOTE)         Pre-diabetes: 5.7 - 6.4         Diabetes: >6.4         Glycemic control for adults with diabetes: <7.0     CBG: Recent Labs  Lab 11/14/22 0155 11/14/22 0306 11/14/22 0458 11/14/22 0604 11/14/22 0659  GLUCAP 138* 127* 151* 144* 122*    Review of Systems:   Patient is encephalopathic and/or intubated. Therefore history has been obtained from chart review.    Past Medical History:  He,  has a past medical history of Anxiety, Brain tumor ([place]), CAD (coronary artery disease), Depression, GERD (gastroesophageal reflux disease), Hyperlipidemia, Ischemic cardiomyopathy, Kidney stone, Metabolic syndrome (8/89/1694), Migraines, Morbid obesity ([place]),  Pre-diabetes, Seizures ([place]), Sleep apnea (2009), and Tobacco abuse.   Surgical History:   Past Surgical History:  Procedure Laterality Date   BRAIN SURGERY  2004   Crainotomy for tumor   CARDIAC CATHETERIZATION N/A 03/21/2015   Procedure: Left Heart Cath and Coronary Angiography;  Surgeon: Sherren Mocha, MD;  Location: [place] [facility];  Service: Cardiovascular;  Laterality: N/A;   KNEE ARTHROSCOPY  Right 2005 approx   cartliage   LEFT HEART CATH AND CORONARY ANGIOGRAPHY N/A 11/12/2022   Procedure: LEFT HEART CATH AND CORONARY ANGIOGRAPHY;  Surgeon: Lorretta Harp, MD;  Location: [place] [facility];  Service: Cardiovascular;  Laterality: N/A;   ULNAR NERVE TRANSPOSITION Left 06/25/2014   Procedure: LEFT ULNAR NEUROPLASTY AT ELBOW;  Surgeon: Jolyn Nap, MD;  Location: [facility];  Service: Orthopedics;  Laterality: Left;   WISDOM TOOTH EXTRACTION       Social History:   reports that he has been smoking cigarettes. He has a 10.00 pack-year smoking history. He has never used smokeless tobacco. He reports that he does not drink alcohol and does not use drugs.   Family History:  His family history includes Healthy in his mother, sister, and sister; Heart disease in his father; Stroke in his father. There is no history of Heart attack.   Allergies Allergies  Allergen Reactions   Zithromax [Azithromycin] Hives     Home Medications  Prior to Admission medications   Medication Sig Start Date End Date Taking? Authorizing Provider  acetaminophen (TYLENOL) 500 MG tablet Take 1,000 mg by mouth every 6 (six) hours as needed for mild pain.   Yes [provider]  aspirin EC 81 MG EC tablet Take 1 tablet (81 mg total) by mouth daily. 03/23/15  Yes Isaiah Serge, NP  CALCIUM CARBONATE-VITAMIN D PO Take 1 tablet by mouth daily with breakfast.   Yes [provider]  carvedilol (COREG) 6.25 MG tablet Take 1 tablet by mouth in the morning and at bedtime. Patient taking  differently: Take 6.25 mg by mouth 2 (two) times daily with a meal. 03/01/22  Yes Marylu Lund., NP  Cholecalciferol (VITAMIN D) 125 MCG (5000 UT) CAPS Take 5,000 Units by mouth daily.   Yes [provider]  citalopram (CELEXA) 40 MG tablet Take 40 mg by mouth every morning.   Yes [provider]  clonazePAM (KLONOPIN) 0.5 MG tablet Take 0.25-0.5 mg by mouth 2 (two) times daily.   Yes [provider]  famotidine (PEPCID) 20 MG tablet TAKE 1 TABLET BY MOUTH EVERY DAY Patient taking differently: Take 20 mg by mouth at bedtime. 05/15/22  Yes Marylu Lund., NP  Fremanezumab-vfrm (AJOVY) 225 MG/1.5ML SOAJ Inject 1 Dose into the skin every 30 (thirty) days.   Yes [provider]  ibuprofen (ADVIL) 600 MG tablet Take 600 mg by mouth 2 (two) times daily as needed for mild pain. 10/09/22  Yes [provider]  lamoTRIgine (LAMICTAL) 100 MG tablet Take 200 mg by mouth in the morning and at bedtime.   Yes [provider]  levETIRAcetam (KEPPRA XR) 500 MG 24 hr tablet Take 1,000 mg by mouth 2 (two) times daily.   Yes [provider]  lisinopril (ZESTRIL) 2.5 MG tablet Take 1 tablet (2.5 mg total) by mouth daily. 03/01/22  Yes Marylu Lund., NP  Melatonin 5 MG CHEW Chew 15-20 mg by mouth at bedtime.   Yes [provider]  nitroGLYCERIN (NITROSTAT) 0.4 MG SL tablet Place 1 tablet (0.4 mg total) under the tongue every 5 (five) minutes as needed for chest pain (x 3 pills daily). 03/01/22  Yes Marylu Lund., NP  oxyCODONE-acetaminophen (ROXICET) 5-325 MG per tablet Take 1-2 tablets by mouth every 4 (four) hours as needed for severe pain. 03/23/15  Yes Isaiah Serge, NP  pantoprazole (PROTONIX) 40 MG tablet Take 2 tablets (80  mg total) by mouth daily. 03/01/22  Yes Marylu Lund., NP  perampanel [facility]) 8 MG tablet Take 1 tablet by mouth daily. 01/12/22  Yes [provider]  rosuvastatin (CRESTOR) 40 MG tablet Take 1  tablet (40 mg total) by mouth daily. 03/01/22  Yes Marylu Lund., NP  Ubrogepant (UBRELVY) 100 MG TABS Take 1-2 tablets by mouth daily as needed (HEADACHE).   Yes [provider]     Critical care time: 35 minutes    JD Rexene Agent [facility] 11/14/2022, 10:00 AM  Please see [email or web address] for pager details.  From 7A-7P if no response, please call [phone]. After hours, please call ELink [phone].

## 2022-11-14 NOTE — Procedures (Signed)
Extubation Procedure Note  Patient Details:   Name: James Torres DOB: 04-Aug-1966 MRN: [id number]   Airway Documentation:    Vent end date: 11/14/22 Vent end time: 0749   Evaluation  O2 sats: transiently fell during during procedure Complications: No apparent complications Patient did tolerate procedure well. Bilateral Breath Sounds: Clear, Diminished   Yes, pt could speak post extubation.  Pt self-extubated.   Pt placed on NRB with o2 saturations 91%.  Earney Navy 11/14/2022, 7:49 AM

## 2022-11-14 NOTE — Discharge Instructions (Signed)

## 2022-11-14 NOTE — Progress Notes (Signed)
      [address]       [place], [address]             [phone]                 1 Day Post-Op Procedure(s) (LRB): OFF PUMP CORONARY ARTERY BYPASS GRAFTING (CABG) X ONE BYPASS USING LEFT INTERNAL MAMMARY ARTERY. (N/A) TRANSESOPHAGEAL ECHOCARDIOGRAM (TEE) (N/A)   Events: No events Extubated this am _______________________________________________________________ Vitals: BP 107/65   Pulse 71   Temp 98.8 F (37.1 C) (Oral)   Resp (!) 22   Ht 6' (1.829 m)   Wt (!) 174 kg   SpO2 94%   BMI 52.03 kg/m  Filed Weights   11/10/22 0841 11/12/22 0431 11/14/22 0500  Weight: (!) 179.2 kg (!) 172.8 kg (!) 174 kg     - Neuro: alert NAD  - Cardiovascular: sinus  Drips: none.   CVP:  [1 mmHg-30 mmHg] 2 mmHg  - Pulm: EWOB   ABG    Component Value Date/Time   PHART 7.372 11/14/2022 0500   PCO2ART 37.7 11/14/2022 0500   PO2ART 69 (L) 11/14/2022 0500   HCO3 21.8 11/14/2022 0500   TCO2 23 11/14/2022 0500   ACIDBASEDEF 3.0 (H) 11/14/2022 0500   O2SAT 93 11/14/2022 0500    - Abd: ND - Extremity: warm  .Intake/Output      01/09 0701 01/10 0700 01/10 0701 01/11 0700   P.O.     I.V. (mL/kg) 2701.8 (15.5)    IV Piggyback 1459.3    Total Intake(mL/kg) 4161.1 (23.9)    Urine (mL/kg/hr) 1695 (0.4)    Blood 100    Chest Tube 190    Total Output 1985    Net +2176.1            _______________________________________________________________ Labs:    Latest Ref Rng & Units 11/14/2022    5:00 AM 11/14/2022    3:58 AM 11/13/2022   11:23 PM  CBC  WBC 4.0 - 10.5 K/uL  9.1  8.4   Hemoglobin 13.0 - 17.0 g/dL 13.6  14.1  13.7   Hematocrit 39.0 - 52.0 % 40.0  41.3  40.8   Platelets 150 - 400 K/uL  140  146       Latest Ref Rng & Units 11/14/2022    5:00 AM 11/14/2022    3:58 AM 11/13/2022   11:23 PM  CMP  Glucose 70 - 99 mg/dL  128  133   BUN 6 - 20 mg/dL  10  10   Creatinine 0.61 - 1.24 mg/dL  0.92  1.13   Sodium 135 - 145 mmol/L 137  133  135    Potassium 3.5 - 5.1 mmol/L 4.2  3.9  4.1   Chloride 98 - 111 mmol/L  103  105   CO2 22 - 32 mmol/L  19  23   Calcium 8.9 - 10.3 mg/dL  7.8  8.2     CXR: PV congestion  _______________________________________________________________  Assessment and Plan: POD 1 s/p CABG  Neuro: pain controlled CV: A/S/BB Pulm: continue Bipap for now, IS, ambulation if possible Renal: creat stable GI: NPO for now Heme: stable ID: [email or web address] Endo: SSI Dispo: continue ICU care   Millianna Szymborski O Mariam Helbert 11/14/2022 8:17 AM

## 2022-11-14 NOTE — Progress Notes (Signed)
eLink Physician-Brief Progress Note Patient Name: James Torres DOB: 09-24-1966 MRN: [id number]   Date of Service  11/14/2022  HPI/Events of Note  Patient not able to come off BiPAP. Nursing request to change Klonopin, Metoprolol and Fycompa from PO to IV. Unfortunately, there are no IV substitutes for Klonopin and Fycompa.   eICU Interventions  Plan: D/C Metoprolol PO/per tube. Metoprolol 2.5 mg IV now and Q 6 hours. Hold dose for SBP < 105 or HR < 60.      Intervention Category Major Interventions: Other:  Jerardo Costabile Cornelia Copa 11/14/2022, 9:10 PM

## 2022-11-14 NOTE — Progress Notes (Signed)
Patient ID: James Torres, male   DOB: 24-Sep-1966, 57 y.o.   MRN: [id number] TCTS Evening Rounds:  Hemodynamically stable in sinus rhythm. CVP 7  Still on 15L HFNC.    UO 60/hr. -236 cc so far today.  CBC    Component Value Date/Time   WBC 11.8 (H) 11/14/2022 1614   RBC 4.55 11/14/2022 1614   HGB 14.2 11/14/2022 1614   HCT 42.9 11/14/2022 1614   PLT 129 (L) 11/14/2022 1614   MCV 94.3 11/14/2022 1614   MCV 92.9 01/19/2012 1922   MCH 31.2 11/14/2022 1614   MCHC 33.1 11/14/2022 1614   RDW 14.5 11/14/2022 1614   LYMPHSABS 2.0 11/10/2022 0832   MONOABS 0.4 11/10/2022 0832   EOSABS 0.2 11/10/2022 0832   BASOSABS 0.1 11/10/2022 0832   BMET pending this pm.

## 2022-11-15 ENCOUNTER — Inpatient Hospital Stay (HOSPITAL_COMMUNITY): Payer: Medicare PPO

## 2022-11-15 DIAGNOSIS — Z951 Presence of aortocoronary bypass graft: Secondary | ICD-10-CM

## 2022-11-15 DIAGNOSIS — G4733 Obstructive sleep apnea (adult) (pediatric): Secondary | ICD-10-CM

## 2022-11-15 DIAGNOSIS — I214 Non-ST elevation (NSTEMI) myocardial infarction: Secondary | ICD-10-CM | POA: Diagnosis not present

## 2022-11-15 DIAGNOSIS — D696 Thrombocytopenia, unspecified: Secondary | ICD-10-CM

## 2022-11-15 LAB — BASIC METABOLIC PANEL
Anion gap: 10 (ref 5–15)
BUN: 8 mg/dL (ref 6–20)
CO2: 23 mmol/L (ref 22–32)
Calcium: 8.4 mg/dL — ABNORMAL LOW (ref 8.9–10.3)
Chloride: 102 mmol/L (ref 98–111)
Creatinine, Ser: 0.82 mg/dL (ref 0.61–1.24)
GFR, Estimated: >60 mL/min (ref >60)
Glucose, Bld: 111 mg/dL — ABNORMAL HIGH (ref 70–99)
Potassium: 4.3 mmol/L (ref 3.5–5.1)
Sodium: 135 mmol/L (ref 135–145)

## 2022-11-15 LAB — CBC
HCT: 43.2 % (ref 39.0–52.0)
Hemoglobin: 14.6 g/dL (ref 13.0–17.0)
MCH: 31.7 pg (ref 26.0–34.0)
MCHC: 33.8 g/dL (ref 30.0–36.0)
MCV: 93.9 fL (ref 80.0–100.0)
Platelets: 136 10*3/uL — ABNORMAL LOW (ref 150–400)
RBC: 4.6 MIL/uL (ref 4.22–5.81)
RDW: 14.6 % (ref 11.5–15.5)
WBC: 12.6 10*3/uL — ABNORMAL HIGH (ref 4.0–10.5)
nRBC: 0 % (ref 0.0–0.2)

## 2022-11-15 LAB — GLUCOSE, CAPILLARY
Glucose-Capillary: 124 mg/dL — ABNORMAL HIGH (ref 70–99)
Glucose-Capillary: 127 mg/dL — ABNORMAL HIGH (ref 70–99)
Glucose-Capillary: 130 mg/dL — ABNORMAL HIGH (ref 70–99)
Glucose-Capillary: 143 mg/dL — ABNORMAL HIGH (ref 70–99)
Glucose-Capillary: 143 mg/dL — ABNORMAL HIGH (ref 70–99)
Glucose-Capillary: 151 mg/dL — ABNORMAL HIGH (ref 70–99)
Glucose-Capillary: 211 mg/dL — ABNORMAL HIGH (ref 70–99)

## 2022-11-15 LAB — POCT I-STAT 7, (LYTES, BLD GAS, ICA,H+H)
Acid-base deficit: 2 mmol/L (ref 0.0–2.0)
Bicarbonate: 23.7 mmol/L (ref 20.0–28.0)
Calcium, Ion: 1.21 mmol/L (ref 1.15–1.40)
HCT: 43 % (ref 39.0–52.0)
Hemoglobin: 14.6 g/dL (ref 13.0–17.0)
O2 Saturation: 90 %
Patient temperature: 99.9
Potassium: 4.3 mmol/L (ref 3.5–5.1)
Sodium: 136 mmol/L (ref 135–145)
TCO2: 25 mmol/L (ref 22–32)
pCO2 arterial: 43.7 mmHg (ref 32–48)
pH, Arterial: 7.346 — ABNORMAL LOW (ref 7.35–7.45)
pO2, Arterial: 65 mmHg — ABNORMAL LOW (ref 83–108)

## 2022-11-15 MED ORDER — FUROSEMIDE 10 MG/ML IJ SOLN
40.0000 mg | Freq: Once | INTRAMUSCULAR | Status: AC
  Administered 2022-11-15: 40 mg via INTRAVENOUS
  Filled 2022-11-15: qty 4

## 2022-11-15 MED ORDER — MAGNESIUM SULFATE IN D5W 1-5 GM/100ML-% IV SOLN
1.0000 g | Freq: Once | INTRAVENOUS | Status: AC
  Administered 2022-11-15: 1 g via INTRAVENOUS
  Filled 2022-11-15: qty 100

## 2022-11-15 MED ORDER — POLYETHYLENE GLYCOL 3350 17 G PO PACK
17.0000 g | PACK | Freq: Every day | ORAL | Status: DC
  Administered 2022-11-15 – 2022-11-17 (×3): 17 g via ORAL
  Filled 2022-11-15 (×3): qty 1

## 2022-11-15 MED ORDER — ORAL CARE MOUTH RINSE: 15.0000 mL | OROMUCOSAL | Status: DC | PRN

## 2022-11-15 MED ORDER — ORAL CARE MOUTH RINSE
15.0000 mL | OROMUCOSAL | Status: DC
  Administered 2022-11-15 – 2022-11-17 (×7): 15 mL via OROMUCOSAL

## 2022-11-15 NOTE — Progress Notes (Signed)
      [address]       [place] [address]             [phone]      POD # 2 CABG x 1 off pump  BP 134/82   Pulse 88   Temp 98.6 F (37 C) (Oral)   Resp 20   Ht 6' (1.829 m)   Wt (!) 167 kg   SpO2 94%   BMI 49.93 kg/m    Intake/Output Summary (Last 24 hours) at 11/15/2022 1736 Last data filed at 11/15/2022 1653 Gross per 24 hour  Intake 1006.92 ml  Output 3670 ml  Net -2663.08 ml   CBG moderately elevated  Stable POD # 2  Remo Lipps C. Roxan Hockey, MD [facility] [phone]

## 2022-11-15 NOTE — Evaluation (Signed)
Physical Therapy Evaluation Patient Details Name: James Torres MRN: [id number] DOB: 04/26/1966 Today's Date: 11/15/2022  History of Present Illness  Pt is 57 year old presented to [facility] on  11/10/22 with chest pain. Pt with NSTEMI and underwent CABG x 1 on 11/13/22. Self extubated 1/10.  PMH - CAD, PCI, morbid obesity, benign brain tumor, seizure  Clinical Impression  Pt admitted with above diagnosis and presents to PT with functional limitations due to deficits listed below (See PT problem list). Pt needs skilled PT to maximize independence and safety to allow discharge to home with supportive significant other. Expect pt will make steady progress as his is motivated to mobilize so he can return home.        Recommendations for follow up therapy are one component of a multi-disciplinary discharge planning process, led by the attending physician.  Recommendations may be updated based on patient status, additional functional criteria and insurance authorization.  Follow Up Recommendations Home health PT      Assistance Recommended at Discharge Frequent or constant Supervision/Assistance  Patient can return home with the following  A little help with walking and/or transfers;A little help with bathing/dressing/bathroom;Help with stairs or ramp for entrance    Equipment Recommendations Rolling walker (2 wheels);Rollator (4 wheels) (RW vs rollator)  Recommendations for Other Services       Functional Status Assessment Patient has had a recent decline in their functional status and demonstrates the ability to make significant improvements in function in a reasonable and predictable amount of time.     Precautions / Restrictions Precautions Precautions: Fall;Sternal;Other (comment) (seizure) Precaution Comments: educated in "move in the tube" and to avoid pushing/pulling with UEs. Pt with partial petit mal seizures.      Mobility  Bed Mobility Overal bed mobility: Needs Assistance Bed  Mobility: Rolling, Sidelying to Sit, Sit to Sidelying Rolling: Min assist Sidelying to sit: Min assist, HOB elevated     Sit to sidelying: +2 for physical assistance, Mod assist General bed mobility comments: Assist to elevate trunk into sitting and bring hips to EOB. Pt holding heart pillow. Assist to lower trunk and bring legs up returning to sidelying/supine    Transfers Overall transfer level: Needs assistance Equipment used: 1 person hand held assist Ethelene Hal) Transfers: Sit to/from Stand Sit to Stand: Min assist, From elevated surface           General transfer comment: Assist to bring hips up and steady. Verbal cues for hand placement - pt holding heart pillow    Ambulation/Gait Ambulation/Gait assistance: Min assist, +2 safety/equipment Gait Distance (Feet): 70 Feet (x 2) Assistive device: Ethelene Hal Gait Pattern/deviations: Step-through pattern, Decreased stride length, Drifts right/left Gait velocity: decr Gait velocity interpretation: 1.31 - 2.62 ft/sec, indicative of limited community ambulator   General Gait Details: Assist for balance and support. Pt with difficulty with foot placement with turns and verbal cues to keep feet inside the base of Eva walker.  Stairs            Wheelchair Mobility    Modified Rankin (Stroke Patients Only)       Balance Overall balance assessment: Needs assistance Sitting-balance support: No upper extremity supported, Feet supported Sitting balance-Leahy Scale: Good     Standing balance support: Single extremity supported, Bilateral upper extremity supported Standing balance-Leahy Scale: Poor Standing balance comment: UE support  Pertinent Vitals/Pain Pain Assessment Pain Assessment: Faces Faces Pain Scale: Hurts little more Pain Location: head, chest incision Pain Descriptors / Indicators: Aching Pain Intervention(s): Monitored during session, Repositioned    Home  Living Family/patient expects to be discharged to:: Private residence Living Arrangements: Spouse/significant other Available Help at Discharge: Family;Available 24 hours/day Type of Home: House Home Access: Stairs to enter Entrance Stairs-Rails: Left (wall on R) Entrance Stairs-Number of Steps: 3   Home Layout: One level Home Equipment: None      Prior Function Prior Level of Function : Needs assist             Mobility Comments: walks without AD, fall 3 weeks ago, near falls ADLs Comments: assisted for LB bathing and dressing, s/o does IADLs. Pt mostly sponge bathes at sink     Hand Dominance   Dominant Hand: Left    Extremity/Trunk Assessment   Upper Extremity Assessment Upper Extremity Assessment: Defer to OT evaluation    Lower Extremity Assessment Lower Extremity Assessment: Generalized weakness    Cervical / Trunk Assessment Cervical / Trunk Assessment: Normal;Other exceptions (increased body habitus)  Communication   Communication: No difficulties  Cognition Arousal/Alertness: Awake/alert Behavior During Therapy: WFL for tasks assessed/performed Overall Cognitive Status: Within Functional Limits for tasks assessed                                          General Comments General comments (skin integrity, edema, etc.): VSS on O2    Exercises     Assessment/Plan    PT Assessment Patient needs continued PT services  PT Problem List Decreased strength;Decreased activity tolerance;Decreased balance;Decreased mobility;Obesity;Decreased knowledge of precautions       PT Treatment Interventions DME instruction;Gait training;Stair training;Functional mobility training;Therapeutic activities;Therapeutic exercise;Balance training;Patient/family education    PT Goals (Current goals can be found in the Care Plan section)  Acute Rehab PT Goals Patient Stated Goal: return home PT Goal Formulation: With patient Time For Goal Achievement:  11/29/22 Potential to Achieve Goals: Good    Frequency Min 3X/week     Co-evaluation   Reason for Co-Treatment: Complexity of the patient's impairments (multi-system involvement)           AM-PAC PT "6 Clicks" Mobility  Outcome Measure Help needed turning from your back to your side while in a flat bed without using bedrails?: A Little Help needed moving from lying on your back to sitting on the side of a flat bed without using bedrails?: A Little Help needed moving to and from a bed to a chair (including a wheelchair)?: A Little Help needed standing up from a chair using your arms (e.g., wheelchair or bedside chair)?: A Little Help needed to walk in hospital room?: A Little Help needed climbing 3-5 steps with a railing? : Total 6 Click Score: 16    End of Session Equipment Utilized During Treatment: Oxygen Activity Tolerance: Patient tolerated treatment well Patient left: in bed;with call bell/phone within reach;with family/visitor present Nurse Communication: Mobility status;Other (comment) (weight) PT Visit Diagnosis: Other abnormalities of gait and mobility (R26.89);Unsteadiness on feet (R26.81);History of falling (Z91.81)    Time: 1884-1660 PT Time Calculation (min) (ACUTE ONLY): 45 min   Charges:   PT Evaluation $PT Eval Moderate Complexity: [place] [facility] [place] 11/15/2022, 2:55  PM

## 2022-11-15 NOTE — Progress Notes (Signed)
[address]       [place] [address]             [phone]                 2 Days Post-Op Procedure(s) (LRB): OFF PUMP CORONARY ARTERY BYPASS GRAFTING (CABG) X ONE BYPASS USING LEFT INTERNAL MAMMARY ARTERY. (N/A) TRANSESOPHAGEAL ECHOCARDIOGRAM (TEE) (N/A)   Events: No events  _______________________________________________________________ Vitals: BP 136/75 (BP Location: Right Arm)   Pulse 95   Temp 99.1 F (37.3 C) (Axillary)   Resp (!) 24   Ht 6' (1.829 m)   Wt (!) 174 kg   SpO2 94%   BMI 52.03 kg/m  Filed Weights   11/10/22 0841 11/12/22 0431 11/14/22 0500  Weight: (!) 179.2 kg (!) 172.8 kg (!) 174 kg     - Neuro: alert NAD  - Cardiovascular: sinus  Drips: none.   CVP:  [6 mmHg-13 mmHg] 7 mmHg  - Pulm: Comfortable on Bipap   ABG    Component Value Date/Time   PHART 7.346 (L) 11/15/2022 0501   PCO2ART 43.7 11/15/2022 0501   PO2ART 65 (L) 11/15/2022 0501   HCO3 23.7 11/15/2022 0501   TCO2 25 11/15/2022 0501   ACIDBASEDEF 2.0 11/15/2022 0501   O2SAT 90 11/15/2022 0501    - Abd: ND - Extremity: warm  .Intake/Output      01/10 0701 01/11 0700 01/11 0701 01/12 0700   I.V. (mL/kg) 603.1 (3.5)    IV Piggyback 500    Total Intake(mL/kg) 1103.1 (6.3)    Urine (mL/kg/hr) 1270 (0.3)    Blood     Chest Tube 155    Total Output 1425    Net -322            _______________________________________________________________ Labs:    Latest Ref Rng & Units 11/15/2022    5:01 AM 11/14/2022    4:14 PM 11/14/2022    5:00 AM  CBC  WBC 4.0 - 10.5 K/uL 12.6  11.8    Hemoglobin 13.0 - 17.0 g/dL 13.0 - 17.0 g/dL 14.6    14.6  14.2  13.6   Hematocrit 39.0 - 52.0 % 39.0 - 52.0 % 43.2    43.0  42.9  40.0   Platelets 150 - 400 K/uL 136  129        Latest Ref Rng & Units 11/15/2022    5:01 AM 11/14/2022    4:14 PM 11/14/2022    5:00 AM  CMP  Glucose 70 - 99 mg/dL 111  134    BUN 6 - 20 mg/dL 8  10    Creatinine 0.61 - 1.24 mg/dL  0.82  0.90    Sodium 135 - 145 mmol/L 135 - 145 mmol/L 135    136  135  137   Potassium 3.5 - 5.1 mmol/L 3.5 - 5.1 mmol/L 4.3    4.3  3.7  4.2   Chloride 98 - 111 mmol/L 102  104    CO2 22 - 32 mmol/L 23  22    Calcium 8.9 - 10.3 mg/dL 8.4  7.9      CXR: PV congestion  _______________________________________________________________  Assessment and Plan: POD 2 s/p CABG  Neuro: pain controlled CV: A/S/BB Pulm: continue Bipap for now, IS, ambulation if possible Renal: creat stable.  Will diurese GI: NPO for now Heme: stable ID: afebrile Endo: SSI Dispo: continue ICU care   Azzie Thiem O Jameika Kinn 11/15/2022 7:23 AM

## 2022-11-15 NOTE — Evaluation (Signed)
Occupational Therapy Evaluation Patient Details Name: James Torres MRN: [id number] DOB: 24-Oct-1966 Today's Date: 11/15/2022   History of Present Illness Pt is 57 year old presented to [facility] on  11/10/22 with chest pain. Pt with NSTEMI and underwent CABG x 1 on 11/13/22. Self extubated 1/10.  PMH - CAD, PCI, morbid obesity, benign brain tumor, seizure   Clinical Impression   Pt walks with difficulty without AD and endorses a fall 3 weeks ago due to L hip arthritis. Pt is assisted for LE ADLs and all IADLs. He frequently sponge bathes due to seizures. Pt presents with generalized weakness, impaired standing balance and decreased endurance. VSS, but pt requiring 15L O2 during ambulation. Pt requires +2 min to mod assist for mobility and min to total assist for ADLs. Pt has excellent support of his fiance when he returns home. His bathroom cannot accommodate a tub transfer bench, recommending 3 in 1 for possible use in walk in shower vs at sink.      Recommendations for follow up therapy are one component of a multi-disciplinary discharge planning process, led by the attending physician.  Recommendations may be updated based on patient status, additional functional criteria and insurance authorization.   Follow Up Recommendations  Home health OT     Assistance Recommended at Discharge Frequent or constant Supervision/Assistance  Patient can return home with the following A little help with walking and/or transfers;A lot of help with bathing/dressing/bathroom;Assistance with cooking/housework;Assist for transportation;Help with stairs or ramp for entrance    Functional Status Assessment  Patient has had a recent decline in their functional status and demonstrates the ability to make significant improvements in function in a reasonable and predictable amount of time.  Equipment Recommendations  Other (comment) (bariatric 3 in 1)    Recommendations for Other Services       Precautions /  Restrictions Precautions Precautions: Fall;Sternal;Other (comment) (seizure) Precaution Comments: educated in "move in the tube" and to avoid pushing/pulling with UEs      Mobility Bed Mobility Overal bed mobility: Needs Assistance Bed Mobility: Rolling, Sidelying to Sit, Sit to Sidelying Rolling: Min assist Sidelying to sit: +2 for physical assistance, Min assist     Sit to sidelying: Mod assist, +2 for physical assistance General bed mobility comments: cues for technique assist of trunk to sit EOB, for LEs back into bed    Transfers Overall transfer level: Needs assistance Equipment used:  (EVS walker) Transfers: Sit to/from Stand Sit to Stand: Min assist, From elevated surface           General transfer comment: cues for technique, assist to rise and steady      Balance Overall balance assessment: Needs assistance   Sitting balance-Leahy Scale: Fair       Standing balance-Leahy Scale: Poor                             ADL either performed or assessed with clinical judgement   ADL Overall ADL's : Needs assistance/impaired Eating/Feeding: Set up;Sitting   Grooming: Sitting;Supervision/safety   Upper Body Bathing: Moderate assistance;Sitting   Lower Body Bathing: Total assistance;Sit to/from stand;+2 for safety/equipment   Upper Body Dressing : Sitting;Maximal assistance   Lower Body Dressing: Total assistance;+2 for safety/equipment;Sit to/from stand   Toilet Transfer: Minimal assistance;Rollator (4 wheels)   Toileting- Water quality scientist and Hygiene: Total assistance;+2 for safety/equipment;Sit to/from stand  Vision Baseline Vision/History: 1 Wears glasses Ability to See in Adequate Light: 0 Adequate Patient Visual Report: No change from baseline       Perception     Praxis      Pertinent Vitals/Pain Pain Assessment Pain Assessment: Faces Faces Pain Scale: Hurts little more Pain Location: head, chest  incision Pain Descriptors / Indicators: Aching Pain Intervention(s): Monitored during session, Repositioned     Hand Dominance Left   Extremity/Trunk Assessment Upper Extremity Assessment Upper Extremity Assessment: Overall WFL for tasks assessed   Lower Extremity Assessment Lower Extremity Assessment: Generalized weakness   Cervical / Trunk Assessment Cervical / Trunk Assessment: Normal;Other exceptions (increased body habitus)   Communication Communication Communication: No difficulties   Cognition Arousal/Alertness: Awake/alert Behavior During Therapy: WFL for tasks assessed/performed Overall Cognitive Status: Within Functional Limits for tasks assessed                                       General Comments  VSS on 15L    Exercises     Shoulder Instructions      Home Living Family/patient expects to be discharged to:: Private residence Living Arrangements: Spouse/significant other Available Help at Discharge: Family;Available 24 hours/day Type of Home: House Home Access: Stairs to enter [employer] of Steps: 3 Entrance Stairs-Rails: Left (wall on R) Home Layout: One level     Bathroom Shower/Tub: Teacher, early years/pre: Handicapped height     Home Equipment: None          Prior Functioning/Environment Prior Level of Function : Needs assist             Mobility Comments: walks without AD, fall 3 weeks ago, near falls ADLs Comments: assisted for LB bathing and dressing, s/o does IADLs        OT Problem List: Decreased strength;Decreased activity tolerance;Impaired balance (sitting and/or standing);Decreased knowledge of use of DME or AE;Obesity;Pain      OT Treatment/Interventions: Self-care/ADL training;DME and/or AE instruction;Therapeutic activities;Patient/family education;Balance training;Energy conservation    OT Goals(Current goals can be found in the care plan section) Acute Rehab OT Goals OT Goal  Formulation: With patient Time For Goal Achievement: 11/29/22 Potential to Achieve Goals: Good ADL Goals Pt Will Perform Grooming: with supervision;standing Pt Will Transfer to Toilet: ambulating;with supervision;bedside commode Pt Will Perform Toileting - Clothing Manipulation and hygiene: with min assist;sit to/from stand Pt Will Perform Tub/Shower Transfer: with supervision;Shower transfer;with caregiver independent in assisting;ambulating;3 in 1;rolling walker Additional ADL Goal #1: Pt will perform bed mobility with supervision in preparation for ADLs. Additional ADL Goal #2: Pt will employ energy conservation strategies during ADLs and mobility. Additional ADL Goal #3: Pt will adhere to sternal precautions during ADLs and mobility.  OT Frequency: Min 2X/week    Co-evaluation PT/OT/SLP Co-Evaluation/Treatment: Yes            AM-PAC OT "6 Clicks" Daily Activity     Outcome Measure Help from another person eating meals?: None Help from another person taking care of personal grooming?: A Little Help from another person toileting, which includes using toliet, bedpan, or urinal?: Total Help from another person bathing (including washing, rinsing, drying)?: A Lot Help from another person to put on and taking off regular upper body clothing?: A Lot Help from another person to put on and taking off regular lower body clothing?: Total 6 Click Score: 13   End of Session  Equipment Utilized During Treatment: Oxygen;Other (comment) (eva walker, 15L) Nurse Communication: Mobility status;Other (comment) (weight)  Activity Tolerance: Patient tolerated treatment well Patient left: in bed;with call bell/phone within reach;with family/visitor present  OT Visit Diagnosis: Unsteadiness on feet (R26.81);Other abnormalities of gait and mobility (R26.89);Pain;Muscle weakness (generalized) (M62.81)                Time: 3151-7616 OT Time Calculation (min): 46 min Charges:  OT General Charges $OT  Visit: 1 Visit OT Evaluation $OT Eval Moderate Complexity: [place], OTR/L Acute Rehabilitation Services Office: [phone]  Malka So 11/15/2022, 3:18 PM

## 2022-11-15 NOTE — Progress Notes (Signed)
NAME:  James Torres, MRN:  [id number], DOB:  [birth year], LOS: 5 ADMISSION DATE:  11/10/2022, CONSULTATION DATE:  1/9 REFERRING MD:  Dr. Stanford Breed, CHIEF COMPLAINT:  cabg x1   History of Present Illness:  Patient is a 57 yo male w/ pertinent pmh of CAD, ischemic cardiomyopathy, HLD, HTN, seizure on aeds presents to [facility] on 1/6 with chest pain.  1/6 had acute onset chest pain came to [facility] ED for eval. EKG w/ inferior ST depression. Trop 177 then 4400. Treated w/ asa, BB, statin, heparin iv. Cards following. Left heart cath showing 99% stenosis proximal LAD. TCTS consulted for potential CABG. On 1/9 CABG x1 performed. Post intubated. PCCM consulted.  Pertinent  Medical History   Past Medical History:  Diagnosis Date   Anxiety    Brain tumor ([place])    CAD (coronary artery disease)    a. 03/2015 NSTEMI: LM nl, LAD 50ost, 100p (2.75x38 Synergy DES), 75d, RI nl, LCX minor irregs, OM1 min irregs, RCA 50ost, EF 35-45%.   Depression    GERD (gastroesophageal reflux disease)    Hyperlipidemia    Ischemic cardiomyopathy    a. 03/2015 EF 35-45% by LV gram @ time of NSTEMI;  b. 03/2015 Echo: EF 50-55%, sev HK to DK of dist an, apical, and infap walls.Gr 1 DD.   Kidney stone    Metabolic syndrome 04/25/3085   Migraines    Morbid obesity ([place])    Pre-diabetes    Seizures ([place])    a. Has auras and metalic taste in mouth.  Last one 06/2014- last a few seconds, has one every couple weeks, Dr Mare Loan is neurologist and is aware.    Sleep apnea 2009   a. On CPAP.   Tobacco abuse      Significant Hospital Events: Including procedures, antibiotic start and stop dates in addition to other pertinent events   1/9 cabg x 1; post op intubated; pccm consulted 1/10: patient self extubated; placed on bipap 100%. 1/11 might have had some seizures this morning. Off BiPAP Diuresing   Interim History / Subjective:   Off BiPAP this morning Pulm edema on CXR   Objective   Blood pressure 138/72, pulse 94,  temperature 99.1 F (37.3 C), temperature source Axillary, resp. rate (!) 22, height 6' (1.829 m), weight (!) 174 kg, SpO2 93 %. CVP:  [6 mmHg-13 mmHg] 6 mmHg  Vent Mode: BIPAP;PSV FiO2 (%):  [50 %-70 %] 70 % PEEP:  [8 cmH20] 8 cmH20 Pressure Support:  [10 cmH20] 10 cmH20   Intake/Output Summary (Last 24 hours) at 11/15/2022 0844 Last data filed at 11/15/2022 5784 Gross per 24 hour  Intake 1200.09 ml  Output 2260 ml  Net -1059.91 ml   Filed Weights   11/10/22 0841 11/12/22 0431 11/14/22 0500  Weight: (!) 179.2 kg (!) 172.8 kg (!) 174 kg    Examination: General:  Obese middle aged M NAD  HEENT: NCAT pink mm  Neuro: AAOx4  CV: rrr cap refill < 3sec.  PULM:  Crackles bilaterally. Unlabored  GI: soft round ndnt Extremities: no acute joint deformity  Skin: Midline sternal dressing    Resolved Hospital Problem list     Assessment & Plan:   Acute respiratory failure with hypoxia OSA on CPAP  -self extubated 1/10  -weaned to 10L HFNC 1/11  Plan: -QHS / PRN BiPAP -wean O2, goal >92 -IS/mobility (think mobility will be limited by OA hip)  -Diuresis    S/p OPCABG x1  CAD  ICM HTN  HLD  Plan: -chest tube per CVTS  -transition off insulin gtt 1/11  -statin, ASA, BB  -mobility   Hx of seizure disorder Hx Migraine  Plan: -cont lamictal, keppra, and perampanel -will give 1g mag 1/11   Anxiety/depression -on chronic benzos Plan: -cont celexa and klonopin  GERD Plan: -H2B  Thrombocytopenia, mild -r/t surgery P -follow CBC  L hip OA -decreased mobility at home P -PT/OT   Best Practice (right click and "Reselect all SmartList Selections" daily)   Diet/type: Regular consistency (see orders) DVT prophylaxis: SCD GI prophylaxis: H2B and PPI Lines: Central line Foley:  Yes, and it is still needed Code Status:  full code Last date of multidisciplinary goals of care discussion [per primary]  Labs   CBC: Recent Labs  Lab 11/10/22 0832 11/10/22 0850  11/13/22 1719 11/13/22 2010 11/13/22 2323 11/14/22 0358 11/14/22 0500 11/14/22 1614 11/15/22 0501  WBC 6.6   < > 8.5  --  8.4 9.1  --  11.8* 12.6*  NEUTROABS 3.9  --   --   --   --   --   --   --   --   HGB 15.0   < > 14.2  14.3   < > 13.7 14.1 13.6 14.2 14.6  14.6  HCT 46.1   < > 41.6  42.0   < > 40.8 41.3 40.0 42.9 43.2  43.0  MCV 95.8   < > 94.5  --  94.0 92.8  --  94.3 93.9  PLT 155   < > 133*  --  146* 140*  --  129* 136*   < > = values in this interval not displayed.    Basic Metabolic Panel: Recent Labs  Lab 11/10/22 1705 11/11/22 0046 11/13/22 0554 11/13/22 1421 11/13/22 1602 11/13/22 1719 11/13/22 2323 11/14/22 0358 11/14/22 0500 11/14/22 1614 11/15/22 0501  NA  --    < > 137   < > 139   < > 135 133* 137 135 135  136  K  --    < > 4.2   < > 4.2   < > 4.1 3.9 4.2 3.7 4.3  4.3  CL  --    < > 102   < > 104  --  105 103  --  104 102  CO2  --    < > 25  --   --   --  23 19*  --  22 23  GLUCOSE  --    < > 96   < > 97  --  133* 128*  --  134* 111*  BUN  --    < > 9   < > 9  --  10 10  --  10 8  CREATININE  --    < > 1.01   < > 0.80  --  1.13 0.92  --  0.90 0.82  CALCIUM  --    < > 9.2  --   --   --  8.2* 7.8*  --  7.9* 8.4*  MG 2.0  --   --   --   --   --  2.8* 2.5*  --  2.3  --    < > = values in this interval not displayed.   GFR: Estimated Creatinine Clearance: 161.4 mL/min (by C-G formula based on SCr of 0.82 mg/dL). Recent Labs  Lab 11/13/22 2323 11/14/22 0358 11/14/22 1614 11/15/22 0501  WBC 8.4 9.1 11.8* 12.6*  Liver Function Tests: Recent Labs  Lab 11/10/22 0832  AST 33  ALT 26  ALKPHOS 74  BILITOT 0.5  PROT 6.6  ALBUMIN 3.7   No results for input(s): "LIPASE", "AMYLASE" in the last 168 hours. No results for input(s): "AMMONIA" in the last 168 hours.  ABG    Component Value Date/Time   PHART 7.346 (L) 11/15/2022 0501   PCO2ART 43.7 11/15/2022 0501   PO2ART 65 (L) 11/15/2022 0501   HCO3 23.7 11/15/2022 0501   TCO2 25  11/15/2022 0501   ACIDBASEDEF 2.0 11/15/2022 0501   O2SAT 90 11/15/2022 0501     Coagulation Profile: Recent Labs  Lab 11/10/22 0832 11/13/22 1719  INR 0.9 1.2    Cardiac Enzymes: No results for input(s): "CKTOTAL", "CKMB", "CKMBINDEX", "TROPONINI" in the last 168 hours.  HbA1C: Hgb A1c MFr Bld  Date/Time Value Ref Range Status  11/10/2022 08:32 AM 6.1 (H) 4.8 - 5.6 % Final    Comment:    (NOTE)         Prediabetes: 5.7 - 6.4         Diabetes: >6.4         Glycemic control for adults with diabetes: <7.0   03/22/2015 04:05 AM 6.1 (H) 4.8 - 5.6 % Final    Comment:    (NOTE)         Pre-diabetes: 5.7 - 6.4         Diabetes: >6.4         Glycemic control for adults with diabetes: <7.0     CBG: Recent Labs  Lab 11/14/22 1600 11/14/22 2100 11/15/22 0008 11/15/22 0456 11/15/22 0740  GLUCAP 148* 138* 143* 124* 151*   CCT n/a  Eliseo Gum MSN, AGACNP-BC [place] for pager  11/15/2022, 8:44 AM

## 2022-11-15 NOTE — Discharge Summary (Addendum)
[address]       [place] [address]             [phone]    Physician Discharge Summary  Patient ID: James Torres MRN: [id number] DOB/AGE: [birth year] 57 y.o.  Admit date: 11/10/2022 Discharge date: 11/23/2022  Admission Diagnoses:  Patient Active Problem List   Diagnosis Date Noted   OSA on CPAP 11/15/2022   Thrombocytopenia ([place]) 11/15/2022   Acute hypoxic respiratory failure ([place]) 11/14/2022   Acute respiratory failure ([place]) 11/13/2022   Hx of CABG 11/13/2022   NSTEMI (non-ST elevated myocardial infarction) ([place]) 11/10/2022   Migraines 04/18/2017   Seizures ([place]) 09/07/2015   HLD (hyperlipidemia) 42/87/6811   Metabolic syndrome 57/26/2035   Old MI (myocardial infarction) 03/22/2015   Hyperglycemia 03/22/2015   Tobacco abuse 03/22/2015   CAD (coronary artery disease) 03/22/2015   Cardiomyopathy, ischemic 03/22/2015   Chronic diastolic heart failure ([place])    Chest pain 03/21/2015     Discharge Diagnoses:  Patient Active Problem List   Diagnosis Date Noted   OSA on CPAP 11/15/2022   Thrombocytopenia ([place]) 11/15/2022   S/P CABG x 1 11/15/2022   Acute hypoxic respiratory failure ([place]) 11/14/2022   Acute respiratory failure ([place]) 11/13/2022   Hx of CABG 11/13/2022   NSTEMI (non-ST elevated myocardial infarction) ([place]) 11/10/2022   Migraines 04/18/2017   Seizures ([place]) 09/07/2015   HLD (hyperlipidemia) 59/74/1638   Metabolic syndrome 45/36/4680   Old MI (myocardial infarction) 03/22/2015   Hyperglycemia 03/22/2015   Tobacco abuse 03/22/2015   CAD (coronary artery disease) 03/22/2015   Cardiomyopathy, ischemic 03/22/2015   Chronic diastolic heart failure ([place])    Chest pain 03/21/2015     Discharged Condition: stable  History of Present Illness:    [name] is a 57 year old gentleman with a past history of coronary artery disease having undergone PTCI of the LAD in 2016.  This was after presenting with myocardial  infarction.  He has a history of ischemic cardiomyopathy with EF of 30 to 35% and subsequent recovery EF of 45 to 50%.  He also has a past history of dyslipidemia, hypertension, and status post gamma knife therapy for a benign epidermoid right cyst in 2019.  He has a seizure disorder and is managed with 3 antiseizure medications. [name] was brought to the emergency room by EMS on 11/10/2022 after onset of chest pain at home.  He took 2 nitroglycerin sublingually with relief of symptoms.  In the emergency room, EKG showed mild ST segment depressions inferiorly and nonspecific ST-T wave changes inferior leads.  Initial troponin was 177 and later rose to a peak of 4400.  Chest x-ray showed some minor haziness in the left lateral lung base but was otherwise unremarkable. Chest pain had resolved in the emergency room. He was treated with standard therapy for acute non-ST elevation myocardial infarction including aspirin, beta-blocker, statin, and IV heparin he was admitted to the hospital.  Left heart catheterization earlier today demonstrated a discrete 99% stenosis of the proximal LAD.  The LAD stent has some irregularities with maximal  20% stenosis.  The CFX and RCA had no occlusive disease.  Dr. Gwenlyn Found did not feel this very proximal LAD stenosis occurring after the bifurcation of the left main coronary was suitable for PCI. Dr. Gwenlyn Found asked that [name] be considered for coronary bypass grafting with left internal mammary artery to the left anterior descending coronary  artery.   Currently, [name]  is resting in bed with a friend at the bedside.  He denies pain since his initial presentation to the ED. He has been declared disabled since his brain surgery in 2019 to resect a benign epidermoid cyst.  He said he has fairly frequent petit mal seizures subsequent to the brain surgery. His mobility is limited by left hip arthritis.  He has been told he needs hip replacement surgery but was encouraged to loose  weight before doing so.  Dr. [name] reviewed the patient's studies and determined surgical revascularization would provide this patient the best long term treatment. Dr. [name] reviewed the patient's treatment options as well as the risks and benefits of surgery. [name] was agreeable to proceed with surgery.  Hospital Course: [name] arrived at [facility] and was brought to the operating room on 11/13/22. He underwent off pump CABG x 1 utilizing LIMA to LAD. He tolerated the procedure well and was transferred to the SICU in stable condition. He self extubated the morning after surgery and was started on Bipap. His arterial line was removed without complication. He did not require inotropic support and was off all drips on POD1. Beta blocker was started and titrated as able. He was volume overload and diuresed accordingly. BIPAP was discontinued and he was saturating okay on 15L HFNC. Chest tube was removed without complication. Follow up CXR showed a stable left pleural effusion and right basilar atelectasis. Could no longer pull from the central line so it was removed. PT/OT was consulted and recommended home health PT/OT, this recommendation was changed to inpatient rehab the following day which was arranged accordingly. He remained volume overload and was aggressively diuresed. He felt his CPAP was not working well so BiPAP was used over night. Cefepime was started per critical care team due to possible pneumonia. Oxygen was able to be weaned to 2L [place] on POD6. He was felt stable for transfer to the progressive unit. His Prevena was removed and sternal incision was healing well without sign of erythema or infection. He was surgically stable for transfer and went to 4E on 01/16. He was weaned to room air and had good oxygenation. He was given a laxative to assist with bowel movement. Cefepime was stopped on 01/17. Ge was transitioned from IV diuresis to oral diuresis on 01/18. Ec asa was  decreased to 81 mg daily and he was started on Plavix on 01/17 (NSTEMI). He has a history of migraines and had later in his post op course. Per patient, he takes Percocet for these as no other medication seems to help. Oxy was stopped and Percocet PRN ordered. Patient is surgically stable for discharge to CIR.  Consults: None  Significant Diagnostic Studies:  LEFT HEART CATH AND CORONARY ANGIOGRAPHY     Ost LAD to Prox LAD lesion is 99% stenosed.   Mid LAD lesion is 20% stenosed.   Ost RCA to Prox RCA lesion is 50% stenosed.   Treatments: surgery: 11/13/2022  Patient:  James Torres Pre-Op Dx: Single-vessel coronary artery disease                         Morbid obesity Post-op Dx: Same Procedure: Off pump CABG X 1, LIMA to LAD   Surgeon and Role:      * Lightfoot, Lucile Crater, MD - Primary    *B Stehler, PA-C- assisting  Discharge Exam: Blood pressure 114/66, pulse 68, temperature 98.8 F (37.1 C), temperature source Oral,  resp. rate 18, height 6' (1.829 m), weight (!) 165.3 kg, SpO2 93 %.  Cardiovascular: RRR Pulmonary:Clear to auscultation bilaterally Abdomen: Soft, non tender, bowel sounds present. Extremities: Mild bilateral lower extremity edema but has decreased over the last few days Wound: Clean and dry.  No erythema or signs of infection.   Discharge Medications:  The patient has been discharged on:   1.Beta Blocker:  Yes [ x  ]                              No   [   ]                              If No, reason:  2.Ace Inhibitor/ARB: Yes [   ]                                     No  [  x  ]                                     If No, reason:  3.Statin:   Yes [ x  ]                  No  [   ]                  If No, reason: x 4.Ecasa:  Yes  [   ]                  No   [   ]                  If No, reason:  Patient had ACS upon admission:YES  Plavix/P2Y12 inhibitor: Yes [ x  ]                                      No  [   ]     Discharge Instructions      Amb Referral to Cardiac Rehabilitation   Complete by: As directed    Diagnosis:  CABG NSTEMI     CABG X ___: 1   After initial evaluation and assessments completed: Virtual Based Care may be provided alone or in conjunction with Phase 2 Cardiac Rehab based on patient barriers.: Yes   Intensive Cardiac Rehabilitation (ICR) [place] location only OR Traditional Cardiac Rehabilitation (TCR) *If criteria for ICR are not met will enroll in TCR [facility] only): Yes      Allergies as of 11/23/2022       Reactions   Zithromax [azithromycin] Hives        Medication List     STOP taking these medications    carvedilol 6.25 MG tablet Commonly known as: COREG   ibuprofen 600 MG tablet Commonly known as: ADVIL   lisinopril 2.5 MG tablet Commonly known as: ZESTRIL   nitroGLYCERIN 0.4 MG SL tablet Commonly known as: Nitrostat   Ubrelvy 100 MG Tabs Generic drug: Ubrogepant       TAKE these medications    acetaminophen 500 MG tablet Commonly known as: TYLENOL Take 1,000 mg by  mouth every 6 (six) hours as needed for mild pain.   Ajovy 225 MG/1.5ML Soaj Generic drug: Fremanezumab-vfrm Inject 1 Dose into the skin every 30 (thirty) days.   aspirin EC 81 MG tablet Take 1 tablet (81 mg total) by mouth daily.   CALCIUM CARBONATE-VITAMIN D PO Take 1 tablet by mouth daily with breakfast.   citalopram 40 MG tablet Commonly known as: CELEXA Take 40 mg by mouth every morning.   clonazePAM 0.5 MG tablet Commonly known as: KLONOPIN Take 0.25-0.5 mg by mouth 2 (two) times daily.   clopidogrel 75 MG tablet Commonly known as: PLAVIX Take 1 tablet (75 mg total) by mouth daily. Start taking on: November 24, 2022   famotidine 20 MG tablet Commonly known as: PEPCID Take 1 tablet (20 mg total) by mouth at bedtime.   furosemide 40 MG tablet Commonly known as: LASIX Take 1 tablet (40 mg total) by mouth daily. Start taking on: November 24, 2022   Keppra XR 500 MG 24 hr tablet Generic  drug: levETIRAcetam Take 1,000 mg by mouth 2 (two) times daily.   lamoTRIgine 100 MG tablet Commonly known as: LAMICTAL Take 200 mg by mouth in the morning and at bedtime.   Melatonin 5 MG Chew Chew 15-20 mg by mouth at bedtime.   metoprolol tartrate 25 MG tablet Commonly known as: LOPRESSOR Take 1 tablet (25 mg total) by mouth 2 (two) times daily.   oxyCODONE-acetaminophen 5-325 MG tablet Commonly known as: Roxicet Take 1-2 tablets by mouth every 4 (four) hours as needed for severe pain.   pantoprazole 40 MG tablet Commonly known as: PROTONIX Take 2 tablets (80 mg total) by mouth daily.   perampanel 8 MG tablet Commonly known as: FYCOMPA Take 1 tablet by mouth daily.   potassium chloride SA 20 MEQ tablet Commonly known as: KLOR-CON M Take 1 tablet (20 mEq total) by mouth daily. Start taking on: November 24, 2022   rosuvastatin 40 MG tablet Commonly known as: CRESTOR Take 1 tablet (40 mg total) by mouth daily.   Vitamin D 125 MCG (5000 UT) Caps Take 5,000 Units by mouth daily.               Durable Medical Equipment  (From admission, onward)           Start     Ordered   11/16/22 1242  For home use only DME 3 n 1  Once       Comments: Patient needs bariatric 3n1   11/16/22 1241   11/16/22 1241  For home use only DME 4 wheeled rolling walker with seat  Once       Question:  Patient needs a walker to treat with the following condition  Answer:  Physical deconditioning   11/16/22 1240            Follow-up Information     Lajuana Matte, MD Follow up on 11/30/2022.   Specialty: Cardiothoracic Surgery Why: Virtual appointment at 2:50PM. Do NOT come to the office as this is a VIRTUAL appointment. Dr. [name] will call you. Contact information: [address] [place] [address] [phone]         Loel Dubonnet, NP Follow up on 12/10/2022.   Specialty: Cardiology Why: Cardiology follow up at 10:05AM Contact  information: [address] [place] [address] [phone]                 Signed:  Nani Skillern, PA-C  11/23/2022, 1:29 PM

## 2022-11-16 DIAGNOSIS — I214 Non-ST elevation (NSTEMI) myocardial infarction: Secondary | ICD-10-CM | POA: Diagnosis not present

## 2022-11-16 LAB — BASIC METABOLIC PANEL
Anion gap: 11 (ref 5–15)
BUN: 12 mg/dL (ref 6–20)
CO2: 30 mmol/L (ref 22–32)
Calcium: 9.3 mg/dL (ref 8.9–10.3)
Chloride: 94 mmol/L — ABNORMAL LOW (ref 98–111)
Creatinine, Ser: 1.11 mg/dL (ref 0.61–1.24)
GFR, Estimated: >60 mL/min (ref >60)
Glucose, Bld: 132 mg/dL — ABNORMAL HIGH (ref 70–99)
Potassium: 4.5 mmol/L (ref 3.5–5.1)
Sodium: 135 mmol/L (ref 135–145)

## 2022-11-16 LAB — GLUCOSE, CAPILLARY
Glucose-Capillary: 108 mg/dL — ABNORMAL HIGH (ref 70–99)
Glucose-Capillary: 131 mg/dL — ABNORMAL HIGH (ref 70–99)
Glucose-Capillary: 128 mg/dL — ABNORMAL HIGH (ref 70–99)
Glucose-Capillary: 175 mg/dL — ABNORMAL HIGH (ref 70–99)
Glucose-Capillary: 148 mg/dL — ABNORMAL HIGH (ref 70–99)
Glucose-Capillary: 132 mg/dL — ABNORMAL HIGH (ref 70–99)

## 2022-11-16 MED ORDER — ORAL CARE MOUTH RINSE: 15.0000 mL | OROMUCOSAL | Status: DC | PRN

## 2022-11-16 MED ORDER — FUROSEMIDE 10 MG/ML IJ SOLN
40.0000 mg | Freq: Two times a day (BID) | INTRAMUSCULAR | Status: DC
  Administered 2022-11-16 – 2022-11-20 (×10): 40 mg via INTRAVENOUS
  Filled 2022-11-16 (×10): qty 4

## 2022-11-16 MED ORDER — SORBITOL 70 % SOLN
30.0000 mL | Freq: Once | Status: AC
  Administered 2022-11-16: 30 mL via ORAL
  Filled 2022-11-16: qty 30

## 2022-11-16 NOTE — Progress Notes (Addendum)
[address]       [place] [address]             [phone]      3 Days Post-Op Procedure(s) (LRB): OFF PUMP CORONARY ARTERY BYPASS GRAFTING (CABG) X ONE BYPASS USING LEFT INTERNAL MAMMARY ARTERY. (N/A) TRANSESOPHAGEAL ECHOCARDIOGRAM (TEE) (N/A) Subjective: Patient states he feels like he has had a couple small seizures this afternoon.  Objective: Vital signs in last 24 hours: Temp:  [97.8 F (36.6 C)-99.1 F (37.3 C)] 99.1 F (37.3 C) (01/12 1225) Pulse Rate:  [85-112] 88 (01/12 1300) Cardiac Rhythm: Normal sinus rhythm;Sinus tachycardia (01/12 0800) Resp:  [13-23] 18 (01/12 1300) BP: (97-148)/(57-82) 128/63 (01/12 1300) SpO2:  [89 %-96 %] 92 % (01/12 1300) FiO2 (%):  [70 %] 70 % (01/12 0400) Weight:  [169.2 kg] 169.2 kg (01/12 0600)  Hemodynamic parameters for last 24 hours:    Intake/Output from previous day: 01/11 0701 - 01/12 0700 In: 1929 [P.O.:1440; I.V.:183.1; IV Piggyback:305.9] Out: 1275 [TZGYF:7494; Chest Tube:20] Intake/Output this shift: Total I/O In: -  Out: 850 [Urine:850]  General appearance: alert and cooperative Neurologic: intact Heart: Regular rate and rhythm-sinus tachycardia, no murmur Lungs: Coarse lung sounds Abdomen: soft, non-tender; bowel sounds normal; no masses,  no organomegaly Extremities: edema 1+ Wound: Prevena on sternal wound  Lab Results: Recent Labs    11/14/22 1614 11/15/22 0501  WBC 11.8* 12.6*  HGB 14.2 14.6  14.6  HCT 42.9 43.2  43.0  PLT 129* 136*   BMET:  Recent Labs    11/15/22 0501 11/16/22 1010  NA 135  136 135  K 4.3  4.3 4.5  CL 102 94*  CO2 23 30  GLUCOSE 111* 132*  BUN 8 12  CREATININE 0.82 1.11  CALCIUM 8.4* 9.3    PT/INR:  Recent Labs    11/13/22 1719  LABPROT 15.5*  INR 1.2   ABG    Component Value Date/Time   PHART 7.346 (L) 11/15/2022 0501   HCO3 23.7 11/15/2022 0501   TCO2 25 11/15/2022 0501   ACIDBASEDEF 2.0 11/15/2022 0501   O2SAT 90 11/15/2022  0501   CBG (last 3)  Recent Labs    11/16/22 0656 11/16/22 0904 11/16/22 1222  GLUCAP 131* 128* 175*    Assessment/Plan: S/P Procedure(s) (LRB): OFF PUMP CORONARY ARTERY BYPASS GRAFTING (CABG) X ONE BYPASS USING LEFT INTERNAL MAMMARY ARTERY. (N/A) TRANSESOPHAGEAL ECHOCARDIOGRAM (TEE) (N/A)  Neuro: Hx of seizures, on Lamictal, Keppra, Perampanel and Celexa. Continue to monitor.  CV: NSR-ST, HR 99. SBP 109. On 2.'5mg'$  Lopressor IV Q6H. Per RN cannot pull from central line, will plan to d/c.   Pulm: Saturating low 90s on 15L HFNC. Last CXR with pulmonary edema. Diurese. Encourage IS and ambulation. Wean oxygen as tolerated.  GI: Some nausea associated with migraine, continue Zofran PRN. Increase diet as tolerated.   Endo: CBG 131/128/175, on SSI and Levemir 10U  Renal: Cr 1.11. Continue Lasix.   Thrombocytopenia: plt 136,000 on 1/11, continue to monitor.  ID: WBC 12.6, Tmax 99.1. Will monitor.  Dispo: Continue to wean oxygen. D/C central line. Leave in unit.    LOS: 6 days    Magdalene River, PA-C 11/16/2022  Agree with above. Rounded with CCM this am and plan created

## 2022-11-16 NOTE — Progress Notes (Signed)
NAME:  James Torres, MRN:  [id number], DOB:  [birth year], LOS: 6 ADMISSION DATE:  11/10/2022, CONSULTATION DATE:  1/9 REFERRING MD:  Dr. Stanford Breed, CHIEF COMPLAINT:  cabg x1   History of Present Illness:  Patient is a 57 yo male w/ pertinent pmh of CAD, ischemic cardiomyopathy, HLD, HTN, seizure on aeds presents to [facility] on 1/6 with chest pain.  1/6 had acute onset chest pain came to [facility] ED for eval. EKG w/ inferior ST depression. Trop 177 then 4400. Treated w/ asa, BB, statin, heparin iv. Cards following. Left heart cath showing 99% stenosis proximal LAD. TCTS consulted for potential CABG. On 1/9 CABG x1 performed. Post intubated. PCCM consulted.  Pertinent  Medical History   Past Medical History:  Diagnosis Date   Anxiety    Brain tumor ([place])    CAD (coronary artery disease)    a. 03/2015 NSTEMI: LM nl, LAD 50ost, 100p (2.75x38 Synergy DES), 75d, RI nl, LCX minor irregs, OM1 min irregs, RCA 50ost, EF 35-45%.   Depression    GERD (gastroesophageal reflux disease)    Hyperlipidemia    Ischemic cardiomyopathy    a. 03/2015 EF 35-45% by LV gram @ time of NSTEMI;  b. 03/2015 Echo: EF 50-55%, sev HK to DK of dist an, apical, and infap walls.Gr 1 DD.   Kidney stone    Metabolic syndrome 04/06/931   Migraines    Morbid obesity ([place])    Pre-diabetes    Seizures ([place])    a. Has auras and metalic taste in mouth.  Last one 06/2014- last a few seconds, has one every couple weeks, Dr Mare Loan is neurologist and is aware.    Sleep apnea 2009   a. On CPAP.   Tobacco abuse      Significant Hospital Events: Including procedures, antibiotic start and stop dates in addition to other pertinent events   1/9 cabg x 1; post op intubated; pccm consulted 1/10: patient self extubated; placed on bipap 100%. 1/11 might have had some seizures this morning. Off BiPAP Diuresing  1/12 HFNC 12L. Icnr to Bid diuresis   Interim History / Subjective:   Bmp pending this morning  Migraine is better    Objective   Blood pressure (!) 109/57, pulse 99, temperature 99.1 F (37.3 C), temperature source Oral, resp. rate 15, height 6' (1.829 m), weight (!) 169.2 kg, SpO2 92 %.    Vent Mode: BIPAP;PSV FiO2 (%):  [70 %] 70 % PEEP:  [8 cmH20] 8 cmH20 Pressure Support:  [8 cmH20] 8 cmH20   Intake/Output Summary (Last 24 hours) at 11/16/2022 1249 Last data filed at 11/16/2022 0940 Gross per 24 hour  Intake 1564.5 ml  Output 4530 ml  Net -2965.5 ml   Filed Weights   11/14/22 0500 11/15/22 1154 11/16/22 0600  Weight: (!) 174 kg (!) 167 kg (!) 169.2 kg    Examination: General:  ill apperaing middle aged M NAD  HEENT: NCAT [place] in place anicteric sclera pink mm  Neuro: AAOx4 no focal deficits  CV: rr cap refill brisk  PULM:  Bilateral crackles  GI: soft ndnt  Extremities: no acute joint deformity Skin: c/d/w   Resolved Hospital Problem list     Assessment & Plan:   Acute respiratory failure with hypoxia OSA on CPAP Pulmonary edema  -self extubated 1/10  -weaning HFNC  Plan: -QHS / PRN BiPAP -wean O2, goal >92 -IS/mobility -incr diuresis 1/12 to '40mg'$  Lasix BID   S/p OP CABGx1  CAD ICM  HTN HLD  Plan: -statin, ASA, BB  -as above, diuresing   Hx seizure disorder  Hx Migraine  Anxiety Depression Plan: -cont lamictal, keppra, and perampanel -celexa -PRN BZD   GERD Plan: -ppi   Thrombocytopenia, mild  -r/t surgery P -PRN CBC   Hyperglycemia -levemir 10u and rSSI   L hip OA  -decreased mobility at home P -PT/OT   Constipation -incr bowel reg, adding sorbitol   Best Practice (right click and "Reselect all SmartList Selections" daily)   Diet/type: Regular consistency (see orders) DVT prophylaxis: SCD GI prophylaxis: PPI Lines: Central line Foley:  N/A Code Status:  full code Last date of multidisciplinary goals of care discussion [per primary]  Labs   CBC: Recent Labs  Lab 11/10/22 0832 11/10/22 0850 11/13/22 1719 11/13/22 2010  11/13/22 2323 11/14/22 0358 11/14/22 0500 11/14/22 1614 11/15/22 0501  WBC 6.6   < > 8.5  --  8.4 9.1  --  11.8* 12.6*  NEUTROABS 3.9  --   --   --   --   --   --   --   --   HGB 15.0   < > 14.2  14.3   < > 13.7 14.1 13.6 14.2 14.6  14.6  HCT 46.1   < > 41.6  42.0   < > 40.8 41.3 40.0 42.9 43.2  43.0  MCV 95.8   < > 94.5  --  94.0 92.8  --  94.3 93.9  PLT 155   < > 133*  --  146* 140*  --  129* 136*   < > = values in this interval not displayed.    Basic Metabolic Panel: Recent Labs  Lab 11/10/22 1705 11/11/22 0046 11/13/22 2323 11/14/22 0358 11/14/22 0500 11/14/22 1614 11/15/22 0501 11/16/22 1010  NA  --    < > 135 133* 137 135 135  136 135  K  --    < > 4.1 3.9 4.2 3.7 4.3  4.3 4.5  CL  --    < > 105 103  --  104 102 94*  CO2  --    < > 23 19*  --  '22 23 30  '$ GLUCOSE  --    < > 133* 128*  --  134* 111* 132*  BUN  --    < > 10 10  --  '10 8 12  '$ CREATININE  --    < > 1.13 0.92  --  0.90 0.82 1.11  CALCIUM  --    < > 8.2* 7.8*  --  7.9* 8.4* 9.3  MG 2.0  --  2.8* 2.5*  --  2.3  --   --    < > = values in this interval not displayed.   GFR: Estimated Creatinine Clearance: 117.2 mL/min (by C-G formula based on SCr of 1.11 mg/dL). Recent Labs  Lab 11/13/22 2323 11/14/22 0358 11/14/22 1614 11/15/22 0501  WBC 8.4 9.1 11.8* 12.6*    Liver Function Tests: Recent Labs  Lab 11/10/22 0832  AST 33  ALT 26  ALKPHOS 74  BILITOT 0.5  PROT 6.6  ALBUMIN 3.7   No results for input(s): "LIPASE", "AMYLASE" in the last 168 hours. No results for input(s): "AMMONIA" in the last 168 hours.  ABG    Component Value Date/Time   PHART 7.346 (L) 11/15/2022 0501   PCO2ART 43.7 11/15/2022 0501   PO2ART 65 (L) 11/15/2022 0501   HCO3 23.7 11/15/2022 0501   TCO2 25 11/15/2022 0501  ACIDBASEDEF 2.0 11/15/2022 0501   O2SAT 90 11/15/2022 0501     Coagulation Profile: Recent Labs  Lab 11/10/22 0832 11/13/22 1719  INR 0.9 1.2    Cardiac Enzymes: No results for  input(s): "CKTOTAL", "CKMB", "CKMBINDEX", "TROPONINI" in the last 168 hours.  HbA1C: Hgb A1c MFr Bld  Date/Time Value Ref Range Status  11/10/2022 08:32 AM 6.1 (H) 4.8 - 5.6 % Final    Comment:    (NOTE)         Prediabetes: 5.7 - 6.4         Diabetes: >6.4         Glycemic control for adults with diabetes: <7.0   03/22/2015 04:05 AM 6.1 (H) 4.8 - 5.6 % Final    Comment:    (NOTE)         Pre-diabetes: 5.7 - 6.4         Diabetes: >6.4         Glycemic control for adults with diabetes: <7.0     CBG: Recent Labs  Lab 11/15/22 2335 11/16/22 0443 11/16/22 0656 11/16/22 0904 11/16/22 1222  GLUCAP 127* 108* 131* 128* 175*   CRITICAL CARE Performed by: Cristal Generous   Total critical care time: 35 minutes  Critical care time was exclusive of separately billable procedures and treating other patients. Critical care was necessary to treat or prevent imminent or life-threatening deterioration.  Critical care was time spent personally by me on the following activities: development of treatment plan with patient and/or surrogate as well as nursing, discussions with consultants, evaluation of patient's response to treatment, examination of patient, obtaining history from patient or surrogate, ordering and performing treatments and interventions, ordering and review of laboratory studies, ordering and review of radiographic studies, pulse oximetry and re-evaluation of patient's condition.  Eliseo Gum MSN, AGACNP-BC [place] for pager  11/16/2022, 12:49 PM

## 2022-11-16 NOTE — Progress Notes (Signed)
Physical Therapy Treatment Patient Details Name: James Torres MRN: [id number] DOB: December 29, 1965 Today's Date: 11/16/2022   History of Present Illness Pt is 57 year old presented to [facility] on  11/10/22 with chest pain. Pt with NSTEMI and underwent CABG x 1 on 11/13/22. Self extubated 1/10.  PMH - CAD, PCI, morbid obesity, benign brain tumor, seizure    PT Comments    Transitioned pt to use of rolling walker from EVA walker. Pt requiring 2nd person hands on assist with rolling walker. Pt continues to have difficulty with placement of feet and I suspect he has peripheral neuropathy (did not see it listed in [place]). Recommend to pt to have shoes brought to assist with stability of feet. Updated dc recommendations to [facility] prior to return home.    Recommendations for follow up therapy are one component of a multi-disciplinary discharge planning process, led by the attending physician.  Recommendations may be updated based on patient status, additional functional criteria and insurance authorization.  Follow Up Recommendations  Acute inpatient rehab (3hours/day)     Assistance Recommended at Discharge Frequent or constant Supervision/Assistance  Patient can return home with the following A little help with walking and/or transfers;A little help with bathing/dressing/bathroom;Help with stairs or ramp for entrance   Equipment Recommendations  Rolling walker (2 wheels);Rollator (4 wheels) (RW vs rollator)    Recommendations for Other Services       Precautions / Restrictions Precautions Precautions: Fall;Sternal;Other (comment) (seizure) Precaution Comments: educated in "move in the tube" and to avoid pushing/pulling with UEs. Pt with partial petit mal seizures.     Mobility  Bed Mobility Overal bed mobility: Needs Assistance Bed Mobility: Sit to Sidelying         Sit to sidelying: +2 for physical assistance, Mod assist General bed mobility comments: Assist to lower trunk and bring legs up  into bed    Transfers Overall transfer level: Needs assistance Equipment used: Rolling walker (2 wheels) Transfers: Sit to/from Stand Sit to Stand: Min assist, +2 safety/equipment, +2 physical assistance           General transfer comment: Assist to bring hips up from low surface and use of rocking momentum to rise. Pt using hands to knees.    Ambulation/Gait Ambulation/Gait assistance: Min assist, +2 safety/equipment, +2 physical assistance Gait Distance (Feet): 55 Feet (x 2) Assistive device: Rolling walker (2 wheels) Gait Pattern/deviations: Step-through pattern, Decreased stride length, Drifts right/left Gait velocity: decr Gait velocity interpretation: <1.31 ft/sec, indicative of household ambulator   General Gait Details: Assist for balance and support. Pt having some difficulty with placement of feet and feet are not necessarily flat on floor. Suspect pt has peripheral neuropathy even though not in PMH. Suggested having shoes brought since that should provide more stability.   Stairs             Wheelchair Mobility    Modified Rankin (Stroke Patients Only)       Balance Overall balance assessment: Needs assistance Sitting-balance support: No upper extremity supported, Feet supported Sitting balance-Leahy Scale: Good     Standing balance support: Single extremity supported, Bilateral upper extremity supported Standing balance-Leahy Scale: Poor Standing balance comment: UE support and min guard and static standing                            Cognition Arousal/Alertness: Awake/alert Behavior During Therapy: WFL for tasks assessed/performed Overall Cognitive Status: Within Functional Limits for tasks assessed  Exercises      General Comments General comments (skin integrity, edema, etc.): VSS on 15L      Pertinent Vitals/Pain Pain Assessment Pain Assessment: Faces Faces Pain  Scale: Hurts little more Pain Location: chest incision Pain Descriptors / Indicators: Aching Pain Intervention(s): Monitored during session, Repositioned, Limited activity within patient's tolerance    Home Living Family/patient expects to be discharged to:: Private residence Living Arrangements: Spouse/significant other Available Help at Discharge: Family;Available 24 hours/day Type of Home: House Home Access: Stairs to enter Entrance Stairs-Rails: Left (wall on R) Entrance Stairs-Number of Steps: 3   Home Layout: One level Home Equipment: None      Prior Function            PT Goals (current goals can now be found in the care plan section) Acute Rehab PT Goals Patient Stated Goal: return home Progress towards PT goals: Progressing toward goals    Frequency    Min 3X/week      PT Plan Discharge plan needs to be updated    Co-evaluation              AM-PAC PT "6 Clicks" Mobility   Outcome Measure  Help needed turning from your back to your side while in a flat bed without using bedrails?: A Little Help needed moving from lying on your back to sitting on the side of a flat bed without using bedrails?: A Little Help needed moving to and from a bed to a chair (including a wheelchair)?: A Little Help needed standing up from a chair using your arms (e.g., wheelchair or bedside chair)?: Total Help needed to walk in hospital room?: Total Help needed climbing 3-5 steps with a railing? : Total 6 Click Score: 12    End of Session Equipment Utilized During Treatment: Oxygen Activity Tolerance: Patient tolerated treatment well Patient left: in bed;with call bell/phone within reach;with family/visitor present Nurse Communication: Mobility status PT Visit Diagnosis: Other abnormalities of gait and mobility (R26.89);Unsteadiness on feet (R26.81);History of falling (Z91.81)     Time: 6160-7371 PT Time Calculation (min) (ACUTE ONLY): 25 min  Charges:  $Gait  Training: 23-37 mins                     [place] 11/16/2022, 1:46 PM

## 2022-11-16 NOTE — Progress Notes (Signed)
   Inpatient Rehab Admissions Coordinator : ° °Per therapy change in recommendations, patient was screened for CIR candidacy by Jacoria Keiffer RN MSN.  At this time patient appears to be a potential candidate for CIR. I will place a rehab consult per protocol for full assessment. Please call me with any questions. ° °Dayten Juba RN MSN °Admissions Coordinator °[phone] °  °

## 2022-11-16 NOTE — Progress Notes (Signed)
Pt placed on bipap for the night and seems to be tolerating it well at this time.

## 2022-11-17 ENCOUNTER — Inpatient Hospital Stay (HOSPITAL_COMMUNITY): Payer: Medicare PPO

## 2022-11-17 DIAGNOSIS — J9601 Acute respiratory failure with hypoxia: Secondary | ICD-10-CM | POA: Diagnosis not present

## 2022-11-17 DIAGNOSIS — I214 Non-ST elevation (NSTEMI) myocardial infarction: Secondary | ICD-10-CM | POA: Diagnosis not present

## 2022-11-17 LAB — BASIC METABOLIC PANEL
Anion gap: 16 — ABNORMAL HIGH (ref 5–15)
BUN: 13 mg/dL (ref 6–20)
CO2: 29 mmol/L (ref 22–32)
Calcium: 9 mg/dL (ref 8.9–10.3)
Chloride: 89 mmol/L — ABNORMAL LOW (ref 98–111)
Creatinine, Ser: 1.02 mg/dL (ref 0.61–1.24)
GFR, Estimated: >60 mL/min (ref >60)
Glucose, Bld: 117 mg/dL — ABNORMAL HIGH (ref 70–99)
Potassium: 3.5 mmol/L (ref 3.5–5.1)
Sodium: 134 mmol/L — ABNORMAL LOW (ref 135–145)

## 2022-11-17 LAB — CBC
HCT: 42.9 % (ref 39.0–52.0)
Hemoglobin: 14.7 g/dL (ref 13.0–17.0)
MCH: 31.7 pg (ref 26.0–34.0)
MCHC: 34.3 g/dL (ref 30.0–36.0)
MCV: 92.5 fL (ref 80.0–100.0)
Platelets: 157 10*3/uL (ref 150–400)
RBC: 4.64 MIL/uL (ref 4.22–5.81)
RDW: 13.9 % (ref 11.5–15.5)
WBC: 11.2 10*3/uL — ABNORMAL HIGH (ref 4.0–10.5)
nRBC: 0 % (ref 0.0–0.2)

## 2022-11-17 LAB — GLUCOSE, CAPILLARY
Glucose-Capillary: 139 mg/dL — ABNORMAL HIGH (ref 70–99)
Glucose-Capillary: 136 mg/dL — ABNORMAL HIGH (ref 70–99)
Glucose-Capillary: 149 mg/dL — ABNORMAL HIGH (ref 70–99)
Glucose-Capillary: 155 mg/dL — ABNORMAL HIGH (ref 70–99)
Glucose-Capillary: 178 mg/dL — ABNORMAL HIGH (ref 70–99)
Glucose-Capillary: 166 mg/dL — ABNORMAL HIGH (ref 70–99)
Glucose-Capillary: 104 mg/dL — ABNORMAL HIGH (ref 70–99)

## 2022-11-17 MED ORDER — ENOXAPARIN SODIUM 40 MG/0.4ML IJ SOSY
40.0000 mg | PREFILLED_SYRINGE | INTRAMUSCULAR | Status: DC
  Administered 2022-11-17 – 2022-11-21 (×5): 40 mg via SUBCUTANEOUS
  Filled 2022-11-17 (×6): qty 0.4

## 2022-11-17 MED ORDER — ALBUTEROL SULFATE (2.5 MG/3ML) 0.083% IN NEBU
2.5000 mg | INHALATION_SOLUTION | RESPIRATORY_TRACT | Status: AC
  Administered 2022-11-17 – 2022-11-18 (×6): 2.5 mg via RESPIRATORY_TRACT
  Filled 2022-11-17 (×6): qty 3

## 2022-11-17 MED ORDER — METOPROLOL TARTRATE 25 MG PO TABS: 25.0000 mg | ORAL_TABLET | Freq: Two times a day (BID) | ORAL | Status: DC

## 2022-11-17 MED ORDER — METOPROLOL TARTRATE 25 MG PO TABS
25.0000 mg | ORAL_TABLET | Freq: Two times a day (BID) | ORAL | Status: DC
  Administered 2022-11-17 – 2022-11-23 (×12): 25 mg via ORAL
  Filled 2022-11-17 (×12): qty 1

## 2022-11-17 NOTE — Progress Notes (Signed)
[address]       [place] [address]             [phone]      4 Days Post-Op  Procedure(s) (LRB): OFF PUMP CORONARY ARTERY BYPASS GRAFTING (CABG) X ONE BYPASS USING LEFT INTERNAL MAMMARY ARTERY. (N/A) TRANSESOPHAGEAL ECHOCARDIOGRAM (TEE) (N/A)  Total Length of Stay:  LOS: 7 days   SUBJECTIVE: Feels well. Got the correct CPAP mask last night and slept well Ambulating some Tolerating diet  Vitals:   11/17/22 0800 11/17/22 0900  BP: 116/67 113/75  Pulse: 98 95  Resp: (!) 21   Temp: 98.6 F (37 C)   SpO2: 92% 94%    Intake/Output      01/12 0701 01/13 0700 01/13 0701 01/14 0700   P.O. 1360    I.V. (mL/kg)     IV Piggyback     Total Intake(mL/kg) 1360 (8.2)    Urine (mL/kg/hr) 4000 (1) 250 (0.5)   Emesis/NG output 0    Stool 0    Chest Tube     Total Output 4000 250   Net -2640 -250        Stool Occurrence 0 x    Emesis Occurrence 0 x        sodium chloride Stopped (11/16/22 0412)   sodium chloride     sodium chloride Stopped (11/15/22 0629)   lactated ringers     lactated ringers Stopped (11/14/22 2304)   levETIRAcetam Stopped (11/15/22 2113)    CBC    Component Value Date/Time   WBC 11.2 (H) 11/17/2022 0441   RBC 4.64 11/17/2022 0441   HGB 14.7 11/17/2022 0441   HCT 42.9 11/17/2022 0441   PLT 157 11/17/2022 0441   MCV 92.5 11/17/2022 0441   MCV 92.9 01/19/2012 1922   MCH 31.7 11/17/2022 0441   MCHC 34.3 11/17/2022 0441   RDW 13.9 11/17/2022 0441   LYMPHSABS 2.0 11/10/2022 0832   MONOABS 0.4 11/10/2022 0832   EOSABS 0.2 11/10/2022 0832   BASOSABS 0.1 11/10/2022 0832   CMP     Component Value Date/Time   NA 134 (L) 11/17/2022 0441   NA 142 09/02/2017 1006   K 3.5 11/17/2022 0441   CL 89 (L) 11/17/2022 0441   CO2 29 11/17/2022 0441   GLUCOSE 117 (H) 11/17/2022 0441   BUN 13 11/17/2022 0441   BUN 12 09/02/2017 1006   CREATININE 1.02 11/17/2022 0441   CALCIUM 9.0 11/17/2022 0441   PROT 6.6 11/10/2022 0832    PROT 6.5 09/02/2017 1006   ALBUMIN 3.7 11/10/2022 0832   ALBUMIN 4.3 09/02/2017 1006   AST 33 11/10/2022 0832   ALT 26 11/10/2022 0832   ALKPHOS 74 11/10/2022 0832   BILITOT 0.5 11/10/2022 0832   BILITOT 0.4 09/02/2017 1006   GFRNONAA >60 11/17/2022 0441   GFRAA 101 09/02/2017 1006   ABG    Component Value Date/Time   PHART 7.346 (L) 11/15/2022 0501   PCO2ART 43.7 11/15/2022 0501   PO2ART 65 (L) 11/15/2022 0501   HCO3 23.7 11/15/2022 0501   TCO2 25 11/15/2022 0501   ACIDBASEDEF 2.0 11/15/2022 0501   O2SAT 90 11/15/2022 0501   CBG (last 3)  Recent Labs    11/16/22 2353 11/17/22 0424 11/17/22 0800  GLUCAP 136* 139* 149*  EXAM Lungs: wheezing  Card:rr Wound: dressing dry Ext: warm   ASSESSMENT: POD#4 sp CABx1 Overall better. Had very good diuresis yesterday Will continue BID lasix today Cont  to ambulate Leave in [place] today   Coralie Common, MD 11/17/2022

## 2022-11-17 NOTE — PMR Pre-admission (Signed)
PMR Admission Coordinator Pre-Admission Assessment  James Torres: James Torres is an 57 y.o., male MRN: [id number] DOB: 09-07-1966 Height: 6' (182.9 cm) Weight: (!) 165.3 kg  Insurance Information HMO:     PPO: yes     PCP:      IPA:      80/20:      OTHER:  PRIMARY: [employer] Medicare Choice      Policy#: [id number]      Subscriber: self CM Name: Andee Poles     Phone#: [phone] ext [phone]     Fax#: [phone] Pre-Cert#: [id number]  approved for 7 days with f/u with Luster Landsberg at [facility] ext [phone] fax [phone]  Employer:  Benefits:  Phone #: [phone]     Name: 1/16 Eff. Date: 11/05/18     Deduct: $500      Out of Pocket Max: $8850 CIR: $325 co pay per day days 1 until 6      SNF: no co pay per day days 1 until 20; $203 co pay per day days 21 until 100 Outpatient: $25 per visit     Co-Pay: visits per medical neccesity Home Health: 100%      Co-Pay: visits per medical neccesity DME: 80%     Co-Pay: 20% Providers: in network  SECONDARY:       Policy#:      Phone#:   Development worker, community:       Phone#:   The Engineer, petroleum" for patients in Inpatient Rehabilitation Facilities with attached "Privacy Act [place] Records" was provided and verbally reviewed with: James Torres  Emergency Contact Information Contact Information     Name Relation Home Work Mobile   [place] Significant other   [phone]   Lara Mulch   [phone]      Current Medical History  James Torres Admitting Diagnosis: Debility, NSTEMI, CABG X 1  History of Present Illness: A 57 year old gentleman with a past history of coronary artery disease having undergone PTCI of the LAD in 2016.  This was after presenting with myocardial infarction.  He has a history of ischemic cardiomyopathy with EF of 30 to 35% and subsequent recovery EF of 45 to 50%.  He also has a past history of dyslipidemia, hypertension, and status post gamma knife therapy for a benign  epidermoid right cyst in 2019.  He has a seizure disorder and is managed with 3 anti seizure medications.  James Torres was brought to the emergency room at [facility] by EMS on 11/10/2022 after onset of chest pain at home.  He took 2 nitroglycerin sublingually with relief of symptoms.  In the emergency room, EKG showed mild ST segment depressions inferiorly and nonspecific ST-T wave changes inferior leads.  Initial troponin was 177 and later rose to a peak of 4400.  Chest x-ray showed some minor haziness in the left lateral lung base but was otherwise unremarkable. Chest pain had resolved in the emergency room. He was treated with standard therapy for acute non-ST elevation myocardial infarction including aspirin, beta-blocker, statin, and IV heparin he was admitted to the hospital.  Left heart catheterization earlier today demonstrated a discrete 99% stenosis of the proximal LAD.  The LAD stent has some irregularities with maximal  20% stenosis.  The CFX and RCA had no occlusive disease.  Dr. Gwenlyn Found did not feel this very proximal LAD stenosis occurring after the bifurcation of the left main coronary was suitable for PCI. Dr. Gwenlyn Found asked that James Torres be considered for coronary bypass grafting with  left internal mammary artery to the left anterior descending coronary artery.  James Torres underwent CABG X1 on 11/13/22 by Dr. Melodie Bouillon.  Postoperative course complicated with possible bilateral multifocal PNA. Using CPAP overnight and [place] during the day. Continue diuresis with Lasix and continue IV Cefepime. Has chronic migraines and has affected his functional abilities on acute hospital.  James Torres's medical record from [facility] has been reviewed by the rehabilitation admission coordinator and physician.  Past Medical History  Past Medical History:  Diagnosis Date   Anxiety    Brain tumor ([place])    CAD (coronary artery disease)    a. 03/2015 NSTEMI: LM nl, LAD 50ost, 100p (2.75x38 Synergy  DES), 75d, RI nl, LCX minor irregs, OM1 min irregs, RCA 50ost, EF 35-45%.   Depression    GERD (gastroesophageal reflux disease)    Hyperlipidemia    Ischemic cardiomyopathy    a. 03/2015 EF 35-45% by LV gram @ time of NSTEMI;  b. 03/2015 Echo: EF 50-55%, sev HK to DK of dist an, apical, and infap walls.Gr 1 DD.   Kidney stone    Metabolic syndrome 2/53/6644   Migraines    Morbid obesity ([place])    Pre-diabetes    Seizures ([place])    a. Has auras and metalic taste in mouth.  Last one 06/2014- last a few seconds, has one every couple weeks, Dr Mare Loan is neurologist and is aware.    Sleep apnea 2009   a. On CPAP.   Tobacco abuse    Has the James Torres had major surgery during 100 days prior to admission? Yes  Family History   family history includes Healthy in his mother, sister, and sister; Heart disease in his father; Stroke in his father.  Current Medications  Current Facility-Administered Medications:    0.9 %  sodium chloride infusion, , Intravenous, Continuous, Barrett, Erin R, PA-C, Last Rate: 20 mL/hr at 11/20/22 2202, New Bag at 11/20/22 2202   0.9 %  sodium chloride infusion, 250 mL, Intravenous, PRN, Stehler, Pricilla Larsson, PA-C   acetaminophen (TYLENOL) tablet 650 mg, 650 mg, Oral, Q6H PRN, Jacky Kindle, MD, 650 mg at 11/20/22 2154   aspirin EC tablet 81 mg, 81 mg, Oral, Daily, 81 mg at 11/23/22 1141 **OR** aspirin chewable tablet 81 mg, 81 mg, Per Tube, Daily, Zimmerman, Donielle M, PA-C   bisacodyl (DULCOLAX) EC tablet 10 mg, 10 mg, Oral, Daily, 10 mg at 11/22/22 1002 **OR** bisacodyl (DULCOLAX) suppository 10 mg, 10 mg, Rectal, Daily, Barrett, Erin R, PA-C   Chlorhexidine Gluconate Cloth 2 % PADS 6 each, 6 each, Topical, Daily, Lightfoot, Lucile Crater, MD, 6 each at 11/21/22 0715   citalopram (CELEXA) tablet 40 mg, 40 mg, Oral, q morning, Barrett, Erin R, PA-C, 40 mg at 11/23/22 1141   clonazePAM (KLONOPIN) tablet 0.5 mg, 0.5 mg, Oral, BID, Zimmerman, Donielle M, PA-C, 0.5 mg at  11/23/22 1140   clopidogrel (PLAVIX) tablet 75 mg, 75 mg, Oral, Daily, Stehler, Bailey C, PA-C, 75 mg at 11/23/22 1141   dextromethorphan-guaiFENesin (MUCINEX DM) 30-600 MG per 12 hr tablet 1 tablet, 1 tablet, Oral, BID PRN, Stehler, Bailey C, PA-C   docusate sodium (COLACE) capsule 200 mg, 200 mg, Oral, Daily, Barrett, Erin R, PA-C, 200 mg at 11/22/22 1002   enoxaparin (LOVENOX) injection 40 mg, 40 mg, Subcutaneous, Q24H, Agarwala, Ravi, MD, 40 mg at 11/21/22 1629   feeding supplement (ENSURE ENLIVE / ENSURE PLUS) liquid 237 mL, 237 mL, Oral, TID BM, Lightfoot, Lucile Crater, MD,  237 mL at 11/23/22 1143   furosemide (LASIX) tablet 40 mg, 40 mg, Oral, Daily, Stehler, Pricilla Larsson, PA-C, 40 mg at 11/23/22 1141   lactated ringers infusion, , Intravenous, Continuous, Barrett, Erin R, PA-C   lamoTRIgine (LAMICTAL) tablet 200 mg, 200 mg, Oral, BID, Lars Pinks M, PA-C, 200 mg at 11/23/22 1142   [DISCONTINUED] levETIRAcetam (KEPPRA) IVPB 1000 mg/100 mL premix, 1,000 mg, Intravenous, Q12H, Stopped at 11/15/22 2113 **OR** levETIRAcetam (KEPPRA XR) 24 hr tablet 1,000 mg, 1,000 mg, Oral, Q12H, Lightfoot, Harrell O, MD, 1,000 mg at 11/23/22 1141   metoprolol tartrate (LOPRESSOR) injection 2.5-5 mg, 2.5-5 mg, Intravenous, Q2H PRN, Barrett, Erin R, PA-C   metoprolol tartrate (LOPRESSOR) tablet 25 mg, 25 mg, Oral, BID, Einar Grad, RPH, 25 mg at 11/23/22 1142   ondansetron (ZOFRAN) injection 4 mg, 4 mg, Intravenous, Q6H PRN, Barrett, Erin R, PA-C, 4 mg at 11/15/22 6712   Oral care mouth rinse, 15 mL, Mouth Rinse, PRN, Lightfoot, Harrell O, MD   oxyCODONE-acetaminophen (PERCOCET/ROXICET) 5-325 MG per tablet 1 tablet, 1 tablet, Oral, Q6H PRN, 1 tablet at 11/23/22 1144 **AND** oxyCODONE (Oxy IR/ROXICODONE) immediate release tablet 2.5 mg, 2.5 mg, Oral, Q6H PRN, Lightfoot, Harrell O, MD, 2.5 mg at 11/23/22 1144   pantoprazole (PROTONIX) EC tablet 80 mg, 80 mg, Oral, Daily, Barrett, Erin R, PA-C, 80 mg at  11/23/22 1140   perampanel (FYCOMPA) tablet 8 mg, 8 mg, Oral, QHS, Barrett, Erin R, PA-C, 8 mg at 11/22/22 2107   polyethylene glycol (MIRALAX / GLYCOLAX) packet 17 g, 17 g, Oral, BID, Bowser, Grace E, NP, 17 g at 11/21/22 2257   rosuvastatin (CRESTOR) tablet 40 mg, 40 mg, Oral, QHS, Barrett, Erin R, PA-C, 40 mg at 11/22/22 2107   sodium chloride flush (NS) 0.9 % injection 3 mL, 3 mL, Intravenous, Q12H, Stehler, Bailey C, PA-C, 3 mL at 11/21/22 2313   sodium chloride flush (NS) 0.9 % injection 3 mL, 3 mL, Intravenous, PRN, Stehler, Bailey C, PA-C, 3 mL at 11/23/22 1147   traMADol (ULTRAM) tablet 50-100 mg, 50-100 mg, Oral, Q4H PRN, Barrett, Erin R, PA-C, 100 mg at 11/20/22 0735  Patients Current Diet:  Diet Order             Diet Heart Room service appropriate? Yes; Fluid consistency: Thin  Diet effective now                  Precautions / Restrictions Precautions Precautions: Fall, Sternal, Other (comment) (seizures) Precaution Booklet Issued: Yes (comment) Precaution Comments: h/o partial petit mal seizures (per chart, pt typically knows when they are about to occur) Restrictions Weight Bearing Restrictions: Yes (Sternal precautions) RUE Weight Bearing: Partial weight bearing RUE Partial Weight Bearing Percentage or Pounds: sternal precautions (sternal precautions) LUE Weight Bearing: Partial weight bearing LUE Partial Weight Bearing Percentage or Pounds: sternal precautions Other Position/Activity Restrictions: sternal precautions   Has the James Torres had 2 or more falls or a fall with injury in the past year? Yes  Prior Activity Level Household: Stays home.  Went out about 3 times a month.  Prior Functional Level Self Care: Did the James Torres need help bathing, dressing, using the toilet or eating? Independent  Indoor Mobility: Did the James Torres need assistance with walking from room to room (with or without device)? Independent  Stairs: Did the James Torres need assistance with  internal or external stairs (with or without device)? Independent  Functional Cognition: Did the James Torres need help planning regular tasks such as shopping or  remembering to take medications? Independent  James Torres Information Are you of Hispanic, Latino/a,or Spanish origin?: A. No, not of Hispanic, Latino/a, or Spanish origin What is your race?: A. White Do you need or want an interpreter to communicate with a doctor or health care staff?: 0. No  James Torres's Response To:  Health Literacy and Transportation Is the James Torres able to respond to health literacy and transportation needs?: Yes Health Literacy - How often do you need to have someone help you when you read instructions, pamphlets, or other written material from your doctor or pharmacy?: Always In the past 12 months, has lack of transportation kept you from medical appointments or from getting medications?: No In the past 12 months, has lack of transportation kept you from meetings, work, or from getting things needed for daily living?: No  Development worker, international aid / [place] Devices/Equipment: CPAP Home Equipment: None  Prior Device Use: Indicate devices/aids used by the James Torres prior to current illness, exacerbation or injury? None of the above  Current Functional Level Cognition  Overall Cognitive Status: Within Functional Limits for tasks assessed Orientation Level: Oriented X4 General Comments: looking to s/o for answers, questionable memory    Extremity Assessment (includes Sensation/Coordination)  Upper Extremity Assessment: Overall WFL for tasks assessed (within precautions)  Lower Extremity Assessment: Defer to PT evaluation    ADLs  Overall ADL's : Needs assistance/impaired Eating/Feeding: Set up, Sitting Grooming: Set up, Sitting, Wash/dry hands Upper Body Bathing: Moderate assistance, Sitting Lower Body Bathing: Total assistance, Sit to/from stand, +2 for safety/equipment Upper Body Dressing :  Sitting, Maximal assistance Lower Body Dressing: Total assistance, +2 for safety/equipment, Sit to/from stand Toilet Transfer: Minimal assistance, Ambulation, BSC/3in1, Rolling walker (2 wheels) Toileting- Clothing Manipulation and Hygiene: Maximal assistance, Sit to/from stand Toileting - Clothing Manipulation Details (indicate cue type and reason): peri care in standing Functional mobility during ADLs: Minimal assistance, Rolling walker (2 wheels) General ADL Comments: cues for sternal cues. unsteady upon standing    Mobility  Overal bed mobility: Needs Assistance Bed Mobility: Rolling, Sidelying to Sit Rolling: Min assist Sidelying to sit: Min assist Supine to sit: Supervision Sit to sidelying: Mod assist General bed mobility comments: cues for technique, assist to raise trunk and for LEs back into bed    Transfers  Overall transfer level: Needs assistance Equipment used: Rolling walker (2 wheels) Transfers: Sit to/from Stand Sit to Stand: Min assist, Min guard General transfer comment: level of assist varies according to height of surface, use of momentum    Ambulation / Gait / Stairs / Wheelchair Mobility  Ambulation/Gait Ambulation/Gait assistance: Min guard, Min assist Gait Distance (Feet): 30 Feet (+ 90' + 104') Assistive device: Rolling walker (2 wheels) Gait Pattern/deviations: Step-through pattern, Decreased stride length, Trunk flexed, Decreased weight shift to left General Gait Details: pt expressing potential for L knee instability, tennis shoes donned. slow, guarded gait with RW and min guard for balance; pt requiring 3x seated rest break secondary to fatigue/SOB, then progressive LLE instability with minA for stability Gait velocity: Decreased Gait velocity interpretation: <1.31 ft/sec, indicative of household ambulator    Posture / Balance Balance Overall balance assessment: Needs assistance Sitting-balance support: No upper extremity supported, Feet  supported Sitting balance-Leahy Scale: Good Standing balance support: Bilateral upper extremity supported, Single extremity supported Standing balance-Leahy Scale: Poor Standing balance comment: reliant on UE support    Special needs/care consideration Home CPAP Bariatric Chronic migraine issues Seizure precautions   Previous Home Environment  Living Arrangements: Spouse/significant other  Available Help at Discharge: Family, Available 24 hours/day Type of Home: House Home Layout: One level Home Access: Stairs to enter Entrance Stairs-Rails: Left (wall on R) Entrance Stairs-Number of Steps: 3 Bathroom Shower/Tub: Chiropodist: Handicapped height Tice: No  Discharge Living Setting Plans for Discharge Living Setting: James Torres's home, House, Lives with (comment) (Lives with GF/fiance.) Type of Home at Discharge: House Discharge Home Layout: One level Discharge Home Access: Stairs to enter Entrance Stairs-Rails: Left Entrance Stairs-Number of Steps: 3 Discharge Bathroom Shower/Tub: Tub/shower unit, Curtain Discharge Bathroom Toilet: Standard Does the James Torres have any problems obtaining your medications?: No  Social/Family/Support Systems James Torres Roles: Other (Comment) (Lives with GF/fiance) Contact Information: Dudley Major - significant other Anticipated Caregiver: Robin Anticipated Caregiver's Contact Information: Dewayne Hatch [phone] Ability/Limitations of Caregiver: GF works from home and can assist. Caregiver Availability: 24/7 Discharge Plan Discussed with Primary Caregiver: Yes Is Caregiver In Agreement with Plan?: Yes Does Caregiver/Family have Issues with Lodging/Transportation while Pt is in Rehab?: No  Goals James Torres/Family Goal for Rehab: PT/OT supervision goals Expected length of stay: 10-12 days Pt/Family Agrees to Admission and willing to participate: Yes Program Orientation Provided & Reviewed with Pt/Caregiver Including  Roles  & Responsibilities: Yes  Decrease burden of Care through IP rehab admission: N/A  Possible need for SNF placement upon discharge: Not anticipated  James Torres Condition: I have reviewed medical records from [facility], spoken with CM, and James Torres and spouse. I met with James Torres at the bedside for inpatient rehabilitation assessment.  James Torres will benefit from ongoing PT and OT, can actively participate in 3 hours of therapy a day 5 days of the week, and can make measurable gains during the admission.  James Torres will also benefit from the coordinated team approach during an Inpatient Acute Rehabilitation admission.  The James Torres will receive intensive therapy as well as Rehabilitation physician, nursing, social worker, and care management interventions.  Due to bladder management, bowel management, safety, skin/wound care, disease management, medication administration, pain management, and James Torres education the James Torres requires 24 hour a day rehabilitation nursing.  The James Torres is currently mod assist overall with mobility and basic ADLs.  Discharge setting and therapy post discharge at home with home health is anticipated.  James Torres has agreed to participate in the Acute Inpatient Rehabilitation Program and will admit today.  Preadmission Screen Completed By:  Cleatrice Burke, 11/23/2022 12:29 PM ______________________________________________________________________   Discussed status with Dr. Naaman Plummer on 11/23/22 at 19 and received approval for admission today.  Admission Coordinator:  Cleatrice Burke, RN, time 0630 Date 11/23/22   Assessment/Plan: Diagnosis: debility after MI/CABG/multiple medical Does the need for close, 24 hr/day Medical supervision in concert with the James Torres's rehab needs make it unreasonable for this James Torres to be served in a less intensive setting? Yes Co-Morbidities requiring supervision/potential complications: HTN, morbid obesity, PNA, OSA on CPAP, sz,  depression with anxiety Due to bladder management, bowel management, safety, skin/wound care, disease management, medication administration, pain management, and James Torres education, does the James Torres require 24 hr/day rehab nursing? Yes Does the James Torres require coordinated care of a physician, rehab nurse, PT, OT to address physical and functional deficits in the context of the above medical diagnosis(es)? Yes Addressing deficits in the following areas: balance, endurance, locomotion, strength, transferring, bowel/bladder control, bathing, dressing, feeding, grooming, toileting, and psychosocial support Can the James Torres actively participate in an intensive therapy program of at least 3 hrs of therapy 5 days a week? Yes The potential for James Torres  to make measurable gains while on inpatient rehab is excellent Anticipated functional outcomes upon discharge from inpatient rehab: supervision PT, supervision OT, n/a SLP Estimated rehab length of stay to reach the above functional goals is: 10-12 days Anticipated discharge destination: Home 10. Overall Rehab/Functional Prognosis: excellent   MD Signature: Meredith Staggers, MD, [place] Director Rehabilitation Services 11/23/2022

## 2022-11-17 NOTE — Progress Notes (Signed)
IP rehab admissions - I met with patient and his girlfriend at the bedside.  Girlfriend lives with patient and she works from home and can assist.  I gave them rehab booklets and explained rehab options.  Patient and GF are interested in CIR.  I will have my partner follow up on Monday for progress and plans.  Call for questions.  [phone]

## 2022-11-17 NOTE — Progress Notes (Signed)
NAME:  James Torres, MRN:  [id number], DOB:  [birth year], LOS: 7 ADMISSION DATE:  11/10/2022, CONSULTATION DATE:  1/9 REFERRING MD:  Dr. Stanford Torres, CHIEF COMPLAINT:  cabg x1   History of Present Illness:  Patient is a 57 yo male w/ pertinent pmh of CAD, ischemic cardiomyopathy, HLD, HTN, seizure on aeds presents to [facility] on 1/6 with chest pain.  1/6 had acute onset chest pain came to [facility] ED for eval. EKG w/ inferior ST depression. Trop 177 then 4400. Treated w/ asa, BB, statin, heparin iv. Cards following. Left heart cath showing 99% stenosis proximal LAD. TCTS consulted for potential CABG. On 1/9 CABG x1 performed. Post intubated. PCCM consulted.  Pertinent  Medical History   Past Medical History:  Diagnosis Date   Anxiety    Brain tumor ([place])    CAD (coronary artery disease)    a. 03/2015 NSTEMI: LM nl, LAD 50ost, 100p (2.75x38 Synergy DES), 75d, RI nl, LCX minor irregs, OM1 min irregs, RCA 50ost, EF 35-45%.   Depression    GERD (gastroesophageal reflux disease)    Hyperlipidemia    Ischemic cardiomyopathy    a. 03/2015 EF 35-45% by LV gram @ time of NSTEMI;  b. 03/2015 Echo: EF 50-55%, sev HK to DK of dist an, apical, and infap walls.Gr 1 DD.   Kidney stone    Metabolic syndrome 02/19/3844   Migraines    Morbid obesity ([place])    Pre-diabetes    Seizures ([place])    a. Has auras and metalic taste in mouth.  Last one 06/2014- last a few seconds, has one every couple weeks, Dr Mare Loan is neurologist and is aware.    Sleep apnea 2009   a. On CPAP.   Tobacco abuse    Significant Hospital Events: Including procedures, antibiotic start and stop dates in addition to other pertinent events   1/9 cabg x 1; post op intubated; pccm consulted 1/10: patient self extubated; placed on bipap 100%. 1/11 might have had some seizures this morning. Off BiPAP Diuresing  1/12 HFNC 12L. Icnr to Bid diuresis.  Was able to ambulate.   Interim History / Subjective:  Generally feels his breathing has  improved.  Improved exercise tolerance.  Denies chest pain.  Objective   Blood pressure 115/72, pulse 95, temperature 98.6 F (37 C), temperature source Oral, resp. rate 20, height 6' (1.829 m), weight (!) 166.7 kg, SpO2 95 %.    Vent Mode: BIPAP;PSV FiO2 (%):  [70 %] 70 % PEEP:  [8 cmH20] 8 cmH20 Pressure Support:  [8 cmH20] 8 cmH20   Intake/Output Summary (Last 24 hours) at 11/17/2022 1301 Last data filed at 11/17/2022 1000 Gross per 24 hour  Intake 1520 ml  Output 3650 ml  Net -2130 ml    Filed Weights   11/15/22 1154 11/16/22 0600 11/17/22 0600  Weight: (!) 167 kg (!) 169.2 kg (!) 166.7 kg   Examination: General:  ill apperaing middle aged M NAD, morbidly obese. HEENT: NCAT [place] in place anicteric sclera pink mm  Neuro: AAOx4 no focal deficits  CV: rr cap refill brisk  PULM: Clear to auscultation bilaterally. GI: soft ndnt  Extremities: no acute joint deformity, mild edema Skin: c/d/w  Ancillary tests personally reviewed  Creatinine continues to improve 1.02 Mild hyponatremia Thrombocytopenia has resolved  Assessment & Plan:   Acute respiratory failure with hypoxia OSA on CPAP Pulmonary edema  S/p OP CABGx1  CAD ICM HTN HLD  Hx seizure disorder  Hx Migraine  Anxiety Depression GERD Hyperglycemia L hip OA  Constipation  -Diurese aggressively for 1 more day.  Anticipate decreasing dose tomorrow as sodium beginning to drop -Continue to follow electrolytes -Continue incentive spirometry and ambulation.  These are likely of primordial importance in improving his oxygenation. -Resume home CPAP -If oxygen less than 4 L/min can transfer to PCU. -Have resumed home seizure medications, antidepressants -Secondary prevention: Crestor, ASA.  Will start beta-blocker.  Best Practice (right click and "Reselect all SmartList Selections" daily)   Diet/type: Regular consistency (see orders) DVT prophylaxis: SCD enoxaparin. GI prophylaxis: PPI Lines: Central lines  have been removed Foley: External urinary catheter Code Status:  full code Last date of multidisciplinary goals of care discussion [patient updated at bedside.]  James Brood, MD [facility] ICU Physician [place]  Pager: [phone] Or Epic Secure Chat After hours: [phone].  11/17/2022, 1:11 PM     11/17/2022, 1:01 PM

## 2022-11-18 DIAGNOSIS — J9601 Acute respiratory failure with hypoxia: Secondary | ICD-10-CM | POA: Diagnosis not present

## 2022-11-18 DIAGNOSIS — I214 Non-ST elevation (NSTEMI) myocardial infarction: Secondary | ICD-10-CM | POA: Diagnosis not present

## 2022-11-18 LAB — GLUCOSE, CAPILLARY
Glucose-Capillary: 147 mg/dL — ABNORMAL HIGH (ref 70–99)
Glucose-Capillary: 131 mg/dL — ABNORMAL HIGH (ref 70–99)
Glucose-Capillary: 165 mg/dL — ABNORMAL HIGH (ref 70–99)
Glucose-Capillary: 118 mg/dL — ABNORMAL HIGH (ref 70–99)
Glucose-Capillary: 169 mg/dL — ABNORMAL HIGH (ref 70–99)
Glucose-Capillary: 140 mg/dL — ABNORMAL HIGH (ref 70–99)

## 2022-11-18 LAB — BASIC METABOLIC PANEL
Anion gap: 9 (ref 5–15)
BUN: 11 mg/dL (ref 6–20)
CO2: 34 mmol/L — ABNORMAL HIGH (ref 22–32)
Calcium: 9 mg/dL (ref 8.9–10.3)
Chloride: 91 mmol/L — ABNORMAL LOW (ref 98–111)
Creatinine, Ser: 1.02 mg/dL (ref 0.61–1.24)
GFR, Estimated: >60 mL/min (ref >60)
Glucose, Bld: 127 mg/dL — ABNORMAL HIGH (ref 70–99)
Potassium: 3.5 mmol/L (ref 3.5–5.1)
Sodium: 134 mmol/L — ABNORMAL LOW (ref 135–145)

## 2022-11-18 MED ORDER — METOLAZONE 5 MG PO TABS
5.0000 mg | ORAL_TABLET | Freq: Once | ORAL | Status: AC
  Administered 2022-11-18: 5 mg via ORAL
  Filled 2022-11-18: qty 1

## 2022-11-18 MED ORDER — ORAL CARE MOUTH RINSE: 15.0000 mL | OROMUCOSAL | Status: DC | PRN

## 2022-11-18 MED ORDER — SODIUM CHLORIDE 0.9 % IV SOLN
2.0000 g | Freq: Three times a day (TID) | INTRAVENOUS | Status: AC
  Administered 2022-11-18 – 2022-11-21 (×11): 2 g via INTRAVENOUS
  Filled 2022-11-18 (×11): qty 12.5

## 2022-11-18 MED ORDER — CLONAZEPAM 0.5 MG PO TABS
0.5000 mg | ORAL_TABLET | Freq: Two times a day (BID) | ORAL | Status: DC | PRN
  Administered 2022-11-18 – 2022-11-19 (×2): 0.5 mg via ORAL
  Filled 2022-11-18 (×2): qty 1

## 2022-11-18 MED ORDER — ORAL CARE MOUTH RINSE
15.0000 mL | OROMUCOSAL | Status: DC
  Administered 2022-11-18 – 2022-11-19 (×2): 15 mL via OROMUCOSAL

## 2022-11-18 MED ORDER — POLYETHYLENE GLYCOL 3350 17 G PO PACK
17.0000 g | PACK | Freq: Two times a day (BID) | ORAL | Status: DC
  Administered 2022-11-18 – 2022-11-21 (×7): 17 g via ORAL
  Filled 2022-11-18 (×7): qty 1

## 2022-11-18 NOTE — Progress Notes (Signed)
Pharmacy Antibiotic Note  James Torres is a 57 y.o. male admitted on 11/10/2022 with CP now s/p CABG 1/9. Pharmacy has been consulted for cefepime dosing with concerns for post-op PNA.   Plan: Cefepime 2g IV q8h  Height: 6' (182.9 cm) Weight: (!) 166.8 kg (367 lb 11.6 oz) IBW/kg (Calculated) : 77.6  Temp (24hrs), Avg:98.9 F (37.2 C), Min:98.3 F (36.8 C), Max:100.4 F (38 C)  Recent Labs  Lab 11/13/22 2323 11/14/22 0358 11/14/22 1614 11/15/22 0501 11/16/22 1010 11/17/22 0441 11/18/22 0652  WBC 8.4 9.1 11.8* 12.6*  --  11.2*  --   CREATININE 1.13 0.92 0.90 0.82 1.11 1.02 1.02    Estimated Creatinine Clearance: 126.5 mL/min (by C-G formula based on SCr of 1.02 mg/dL).    Allergies  Allergen Reactions   Zithromax [Azithromycin] Hives    Arrie Senate, PharmD, BCPS, [facility] Clinical Pharmacist [phone] Please check AMION for all [place] numbers 11/18/2022

## 2022-11-18 NOTE — Progress Notes (Signed)
Pt placed on bipap for the night and seems to be tolerating it well at this time.

## 2022-11-18 NOTE — Progress Notes (Signed)
[address]       [place] [address]             [phone]       EVENING ROUNDS  Better Oxygen requirements this afternoon Neg on I&Os

## 2022-11-18 NOTE — Progress Notes (Signed)
[address]       [place] [address]             [phone]      5 Days Post-Op  Procedure(s) (LRB): OFF PUMP CORONARY ARTERY BYPASS GRAFTING (CABG) X ONE BYPASS USING LEFT INTERNAL MAMMARY ARTERY. (N/A) TRANSESOPHAGEAL ECHOCARDIOGRAM (TEE) (N/A)  Total Length of Stay:  LOS: 8 days   SUBJECTIVE: Pt had issues with CPAP mask last night and had often a feeling he couldn't breath. Had no sleep Feels is reason his O2 requirements are increased this am Did not walk  Vitals:   11/18/22 0749 11/18/22 0800  BP:  (!) 116/95  Pulse:  100  Resp:  18  Temp:    SpO2: 92% 90%    Intake/Output      01/13 0701 01/14 0700 01/14 0701 01/15 0700   P.O. 1300 120   Total Intake(mL/kg) 1300 (7.8) 120 (0.7)   Urine (mL/kg/hr) 2551 (0.6) 200 (0.6)   Emesis/NG output 0    Stool 1    Total Output 2552 200   Net -1252 -80        Urine Occurrence 1 x    Stool Occurrence 1 x    Emesis Occurrence 0 x        sodium chloride Stopped (11/15/22 0629)   lactated ringers     lactated ringers Stopped (11/14/22 2304)   levETIRAcetam Stopped (11/15/22 2113)    CBC    Component Value Date/Time   WBC 11.2 (H) 11/17/2022 0441   RBC 4.64 11/17/2022 0441   HGB 14.7 11/17/2022 0441   HCT 42.9 11/17/2022 0441   PLT 157 11/17/2022 0441   MCV 92.5 11/17/2022 0441   MCV 92.9 01/19/2012 1922   MCH 31.7 11/17/2022 0441   MCHC 34.3 11/17/2022 0441   RDW 13.9 11/17/2022 0441   LYMPHSABS 2.0 11/10/2022 0832   MONOABS 0.4 11/10/2022 0832   EOSABS 0.2 11/10/2022 0832   BASOSABS 0.1 11/10/2022 0832   CMP     Component Value Date/Time   NA 134 (L) 11/18/2022 0652   NA 142 09/02/2017 1006   K 3.5 11/18/2022 0652   CL 91 (L) 11/18/2022 0652   CO2 34 (H) 11/18/2022 0652   GLUCOSE 127 (H) 11/18/2022 0652   BUN 11 11/18/2022 0652   BUN 12 09/02/2017 1006   CREATININE 1.02 11/18/2022 0652   CALCIUM 9.0 11/18/2022 0652   PROT 6.6 11/10/2022 0832   PROT 6.5 09/02/2017  1006   ALBUMIN 3.7 11/10/2022 0832   ALBUMIN 4.3 09/02/2017 1006   AST 33 11/10/2022 0832   ALT 26 11/10/2022 0832   ALKPHOS 74 11/10/2022 0832   BILITOT 0.5 11/10/2022 0832   BILITOT 0.4 09/02/2017 1006   GFRNONAA >60 11/18/2022 0652   GFRAA 101 09/02/2017 1006   ABG    Component Value Date/Time   PHART 7.346 (L) 11/15/2022 0501   PCO2ART 43.7 11/15/2022 0501   PO2ART 65 (L) 11/15/2022 0501   HCO3 23.7 11/15/2022 0501   TCO2 25 11/15/2022 0501   ACIDBASEDEF 2.0 11/15/2022 0501   O2SAT 90 11/15/2022 0501   CBG (last 3)  Recent Labs    11/17/22 2318 11/18/22 0314 11/18/22 0739  GLUCAP 104* 147* 131*  EXAM Lungs: overall distant with some wheezing Card: rr Incision: clean and intact Ext: warm Neuro: intact   ASSESSMENT: POD#6 SP CABx! Respiratory Failure: has done well with diuresis with over 1200 neg. Still has however need  for HF Oxygen support. Will continue aggressive diuresis today. Add matolazone today. Have CPAP mask issues addressed Ambulate if possible    Coralie Common, MD 11/18/2022

## 2022-11-18 NOTE — Progress Notes (Signed)
NAME:  James Torres, MRN:  [id number], DOB:  26-May-1966, LOS: 8 ADMISSION DATE:  11/10/2022, CONSULTATION DATE:  1/9 REFERRING MD:  Dr. Stanford Breed, CHIEF COMPLAINT:  cabg x1   History of Present Illness:  Patient is a 57 yo male w/ pertinent pmh of CAD, ischemic cardiomyopathy, HLD, HTN, seizure on aeds presents to [facility] on 1/6 with chest pain.  1/6 had acute onset chest pain came to [facility] ED for eval. EKG w/ inferior ST depression. Trop 177 then 4400. Treated w/ asa, BB, statin, heparin iv. Cards following. Left heart cath showing 99% stenosis proximal LAD. TCTS consulted for potential CABG. On 1/9 CABG x1 performed. Post intubated. PCCM consulted.  Pertinent  Medical History   Past Medical History:  Diagnosis Date   Anxiety    Brain tumor ([place])    CAD (coronary artery disease)    a. 03/2015 NSTEMI: LM nl, LAD 50ost, 100p (2.75x38 Synergy DES), 75d, RI nl, LCX minor irregs, OM1 min irregs, RCA 50ost, EF 35-45%.   Depression    GERD (gastroesophageal reflux disease)    Hyperlipidemia    Ischemic cardiomyopathy    a. 03/2015 EF 35-45% by LV gram @ time of NSTEMI;  b. 03/2015 Echo: EF 50-55%, sev HK to DK of dist an, apical, and infap walls.Gr 1 DD.   Kidney stone    Metabolic syndrome 02/11/8118   Migraines    Morbid obesity ([place])    Pre-diabetes    Seizures ([place])    a. Has auras and metalic taste in mouth.  Last one 06/2014- last a few seconds, has one every couple weeks, Dr Mare Loan is neurologist and is aware.    Sleep apnea 2009   a. On CPAP.   Tobacco abuse    Significant Hospital Events: Including procedures, antibiotic start and stop dates in addition to other pertinent events   1/9 cabg x 1; post op intubated; pccm consulted 1/10: patient self extubated; placed on bipap 100%. 1/11 might have had some seizures this morning. Off BiPAP Diuresing  1/12 HFNC 12L. Icnr to Bid diuresis.  Was able to ambulate. 1/14 cont diuresis   Interim History / Subjective:  Didn't wear his  CPAP  On 10L  Objective   Blood pressure 113/75, pulse 91, temperature (!) 100.4 F (38 C), temperature source Axillary, resp. rate (!) 26, height 6' (1.829 m), weight (!) 166.8 kg, SpO2 91 %.    Vent Mode: BIPAP;PCV FiO2 (%):  [60 %-70 %] 60 % Set Rate:  [14 bmp] 14 bmp PEEP:  [5 cmH20-8 cmH20] 8 cmH20 Pressure Support:  [10 cmH20] 10 cmH20   Intake/Output Summary (Last 24 hours) at 11/18/2022 1117 Last data filed at 11/18/2022 0900 Gross per 24 hour  Intake 820 ml  Output 2652 ml  Net -1832 ml   Filed Weights   11/16/22 0600 11/17/22 0600 11/18/22 0600  Weight: (!) 169.2 kg (!) 166.7 kg (!) 166.8 kg   Examination: General:  obese middle aged M NAD  HEENT: NCAT  in place anicteric sclera pink mm  Neuro: AAOx4 CV: rr s1s2  PULM: diminished, some crackles  GI: obese soft ndnt Extremities: no acute joint deformity  Skin: c/d/w  Ancillary tests personally reviewed  Creatinine continues to improve 1.02 Mild hyponatremia Thrombocytopenia has resolved  Assessment & Plan:   Acute respiratory failure w hypoxia OSA on CPAP Pulm edema CAD Z/p OP CABG x1 ICM HTN HLD Hx seizure disorder Anxiety/depression GERD Hyperglycemia L hip OA Constipation  -Cont  diuresis, adding metolazone  -Cpulm hygiene, mobility  -qHS CPAP  -Wean O2 as able. Not down enough for transfer out of ICU yet  -cont home AEDs and antidepressants  -crestor ASA BB  -SSI + basal  -cont bowel reg, escalating to BID miralax    Best Practice (right click and "Reselect all SmartList Selections" daily)   Diet/type: Regular consistency (see orders) DVT prophylaxis: SCD enoxaparin. GI prophylaxis: PPI Lines: Central lines have been removed Foley: External urinary catheter Code Status:  full code Last date of multidisciplinary goals of care discussion [patient updated at bedside.]   Eliseo Gum MSN, AGACNP-BC [place] for pager  11/18/2022, 11:17  AM

## 2022-11-19 ENCOUNTER — Inpatient Hospital Stay (HOSPITAL_COMMUNITY): Payer: Medicare PPO

## 2022-11-19 DIAGNOSIS — I214 Non-ST elevation (NSTEMI) myocardial infarction: Secondary | ICD-10-CM | POA: Diagnosis not present

## 2022-11-19 DIAGNOSIS — Z951 Presence of aortocoronary bypass graft: Secondary | ICD-10-CM | POA: Diagnosis not present

## 2022-11-19 DIAGNOSIS — J9601 Acute respiratory failure with hypoxia: Secondary | ICD-10-CM | POA: Diagnosis not present

## 2022-11-19 LAB — BASIC METABOLIC PANEL
Anion gap: 17 — ABNORMAL HIGH (ref 5–15)
BUN: 15 mg/dL (ref 6–20)
CO2: 29 mmol/L (ref 22–32)
Calcium: 9.3 mg/dL (ref 8.9–10.3)
Chloride: 87 mmol/L — ABNORMAL LOW (ref 98–111)
Creatinine, Ser: 1.1 mg/dL (ref 0.61–1.24)
GFR, Estimated: >60 mL/min (ref >60)
Glucose, Bld: 119 mg/dL — ABNORMAL HIGH (ref 70–99)
Potassium: 3.4 mmol/L — ABNORMAL LOW (ref 3.5–5.1)
Sodium: 133 mmol/L — ABNORMAL LOW (ref 135–145)

## 2022-11-19 LAB — EXPECTORATED SPUTUM ASSESSMENT W GRAM STAIN, RFLX TO RESP C

## 2022-11-19 LAB — CBC WITH DIFFERENTIAL/PLATELET
Abs Immature Granulocytes: 0.06 10*3/uL (ref 0.00–0.07)
Basophils Absolute: 0.1 10*3/uL (ref 0.0–0.1)
Basophils Relative: 0 %
Eosinophils Absolute: 0.1 10*3/uL (ref 0.0–0.5)
Eosinophils Relative: 1 %
HCT: 42.2 % (ref 39.0–52.0)
Hemoglobin: 14.9 g/dL (ref 13.0–17.0)
Immature Granulocytes: 1 %
Lymphocytes Relative: 20 %
Lymphs Abs: 2.2 10*3/uL (ref 0.7–4.0)
MCH: 31.9 pg (ref 26.0–34.0)
MCHC: 35.3 g/dL (ref 30.0–36.0)
MCV: 90.4 fL (ref 80.0–100.0)
Monocytes Absolute: 1.1 10*3/uL — ABNORMAL HIGH (ref 0.1–1.0)
Monocytes Relative: 10 %
Neutro Abs: 7.7 10*3/uL (ref 1.7–7.7)
Neutrophils Relative %: 68 %
Platelets: 195 10*3/uL (ref 150–400)
RBC: 4.67 MIL/uL (ref 4.22–5.81)
RDW: 13.8 % (ref 11.5–15.5)
WBC: 11.2 10*3/uL — ABNORMAL HIGH (ref 4.0–10.5)
nRBC: 0 % (ref 0.0–0.2)

## 2022-11-19 LAB — GLUCOSE, CAPILLARY
Glucose-Capillary: 121 mg/dL — ABNORMAL HIGH (ref 70–99)
Glucose-Capillary: 145 mg/dL — ABNORMAL HIGH (ref 70–99)
Glucose-Capillary: 153 mg/dL — ABNORMAL HIGH (ref 70–99)
Glucose-Capillary: 152 mg/dL — ABNORMAL HIGH (ref 70–99)
Glucose-Capillary: 151 mg/dL — ABNORMAL HIGH (ref 70–99)

## 2022-11-19 MED ORDER — SODIUM CHLORIDE 0.9 % IV SOLN: 250.0000 mL | INTRAVENOUS | Status: DC | PRN

## 2022-11-19 MED ORDER — ACETAMINOPHEN 500 MG PO TABS
1000.0000 mg | ORAL_TABLET | Freq: Four times a day (QID) | ORAL | Status: DC | PRN
  Administered 2022-11-19: 1000 mg via ORAL
  Filled 2022-11-19: qty 2

## 2022-11-19 MED ORDER — LAMOTRIGINE 100 MG PO TABS
200.0000 mg | ORAL_TABLET | Freq: Two times a day (BID) | ORAL | Status: DC
  Administered 2022-11-19 – 2022-11-23 (×8): 200 mg via ORAL
  Filled 2022-11-19 (×10): qty 2

## 2022-11-19 MED ORDER — ENSURE ENLIVE PO LIQD
237.0000 mL | Freq: Three times a day (TID) | ORAL | Status: DC
  Administered 2022-11-19 – 2022-11-23 (×6): 237 mL via ORAL

## 2022-11-19 MED ORDER — METOCLOPRAMIDE HCL 5 MG/ML IJ SOLN
10.0000 mg | Freq: Four times a day (QID) | INTRAMUSCULAR | Status: AC
  Administered 2022-11-19 – 2022-11-20 (×6): 10 mg via INTRAVENOUS
  Filled 2022-11-19 (×6): qty 2

## 2022-11-19 MED ORDER — POTASSIUM CHLORIDE CRYS ER 20 MEQ PO TBCR
20.0000 meq | EXTENDED_RELEASE_TABLET | ORAL | Status: AC
  Administered 2022-11-19 (×3): 20 meq via ORAL
  Filled 2022-11-19 (×3): qty 1

## 2022-11-19 MED ORDER — ACETAMINOPHEN 10 MG/ML IV SOLN: 1000.0000 mg | Freq: Four times a day (QID) | INTRAVENOUS | Status: DC | PRN

## 2022-11-19 MED ORDER — CLONAZEPAM 0.5 MG PO TABS
0.5000 mg | ORAL_TABLET | Freq: Two times a day (BID) | ORAL | Status: DC
  Administered 2022-11-19 – 2022-11-23 (×8): 0.5 mg via ORAL
  Filled 2022-11-19 (×8): qty 1

## 2022-11-19 MED ORDER — ~~LOC~~ CARDIAC SURGERY, PATIENT & FAMILY EDUCATION: Freq: Once | Status: AC

## 2022-11-19 MED ORDER — SODIUM CHLORIDE 0.9% FLUSH
3.0000 mL | INTRAVENOUS | Status: DC | PRN
  Administered 2022-11-23: 3 mL via INTRAVENOUS

## 2022-11-19 MED ORDER — ACETAMINOPHEN 325 MG PO TABS
650.0000 mg | ORAL_TABLET | Freq: Four times a day (QID) | ORAL | Status: DC | PRN
  Administered 2022-11-20: 650 mg via ORAL
  Filled 2022-11-19: qty 2

## 2022-11-19 MED ORDER — SODIUM CHLORIDE 0.9% FLUSH
3.0000 mL | Freq: Two times a day (BID) | INTRAVENOUS | Status: DC
  Administered 2022-11-19 – 2022-11-21 (×5): 3 mL via INTRAVENOUS

## 2022-11-19 MED ORDER — INSULIN ASPART 100 UNIT/ML IJ SOLN
0.0000 [IU] | Freq: Three times a day (TID) | INTRAMUSCULAR | Status: DC
  Administered 2022-11-20 (×2): 4 [IU] via SUBCUTANEOUS
  Administered 2022-11-20: 2 [IU] via SUBCUTANEOUS

## 2022-11-19 NOTE — Progress Notes (Signed)
TCTS Progress Note: S/P CAB  Sinus 90s Eating  Labs: none appear pending, CBC and chem tomorrow.   Plan: cont current care     Latest Ref Rng & Units 11/19/2022   12:48 AM 11/17/2022    4:41 AM 11/15/2022    5:01 AM  CBC  WBC 4.0 - 10.5 K/uL 11.2  11.2  12.6   Hemoglobin 13.0 - 17.0 g/dL 14.9  14.7  14.6    14.6   Hematocrit 39.0 - 52.0 % 42.2  42.9  43.2    43.0   Platelets 150 - 400 K/uL 195  157  136        Latest Ref Rng & Units 11/19/2022   12:48 AM 11/18/2022    6:52 AM 11/17/2022    4:41 AM  CMP  Glucose 70 - 99 mg/dL 119  127  117   BUN 6 - 20 mg/dL '15  11  13   '$ Creatinine 0.61 - 1.24 mg/dL 1.10  1.02  1.02   Sodium 135 - 145 mmol/L 133  134  134   Potassium 3.5 - 5.1 mmol/L 3.4  3.5  3.5   Chloride 98 - 111 mmol/L 87  91  89   CO2 22 - 32 mmol/L 29  34  29   Calcium 8.9 - 10.3 mg/dL 9.3  9.0  9.0     ABG    Component Value Date/Time   PHART 7.346 (L) 11/15/2022 0501   PCO2ART 43.7 11/15/2022 0501   PO2ART 65 (L) 11/15/2022 0501   HCO3 23.7 11/15/2022 0501   TCO2 25 11/15/2022 0501   ACIDBASEDEF 2.0 11/15/2022 0501   O2SAT 90 11/15/2022 0501    Vent Mode: BIPAP;PCV FiO2 (%):  [40 %] 40 % Set Rate:  [14 bmp] 14 bmp PEEP:  [8 cmH20] 8 cmH20

## 2022-11-19 NOTE — Progress Notes (Addendum)
Physical Therapy Treatment Patient Details Name: James Torres MRN: [id number] DOB: July 05, 1966 Today's Date: 11/19/2022   History of Present Illness Pt is 57 year old presented to [facility] on  11/10/22 with chest pain. Pt with NSTEMI and underwent CABG x 1 on 11/13/22. Self extubated 1/10.  PMH - CAD, PCI, morbid obesity, benign brain tumor, seizure    PT Comments    Pt making steady progress towards his physical therapy goals and is motivated to participate. Pt requiring min-mod assist for functional mobility (+2 safety with chair follow). Ambulating ~80 ft x 2 with a walker; demonstrates left knee instability and weakness with intermittent buckling. Will benefit from AIR to address endurance, strengthening, balance.     Recommendations for follow up therapy are one component of a multi-disciplinary discharge planning process, led by the attending physician.  Recommendations may be updated based on patient status, additional functional criteria and insurance authorization.  Follow Up Recommendations  Acute inpatient rehab (3hours/day)     Assistance Recommended at Discharge Frequent or constant Supervision/Assistance  Patient can return home with the following A little help with walking and/or transfers;A little help with bathing/dressing/bathroom;Help with stairs or ramp for entrance   Equipment Recommendations  Rolling walker (2 wheels)    Recommendations for Other Services       Precautions / Restrictions Precautions Precautions: Fall;Sternal;Other (comment) (seizure) Precaution Comments: Pt with partial petit mal seizures. Restrictions Weight Bearing Restrictions: Yes (sternal precautions)     Mobility  Bed Mobility Overal bed mobility: Needs Assistance Bed Mobility: Supine to Sit     Supine to sit: Supervision     General bed mobility comments: Good adherence to sternal precautions, increased time/effort    Transfers Overall transfer level: Needs assistance Equipment  used: Rolling walker (2 wheels) Transfers: Sit to/from Stand Sit to Stand: Min assist, Mod assist           General transfer comment: MinA from elevated bed height, modA from the chair. min cues for sternal precautions    Ambulation/Gait Ambulation/Gait assistance: Min assist, +2 safety/equipment Gait Distance (Feet): 80 Feet (80", 80") Assistive device: Rolling walker (2 wheels) Gait Pattern/deviations: Step-through pattern, Decreased stride length, Drifts right/left, Decreased weight shift to left, Knees buckling Gait velocity: decr     General Gait Details: Tennis shoes donned. Verbal cues for keeping walker close during turns; minA due to L knee instability and min buckling. pt requiring one seated rest break   Stairs             Wheelchair Mobility    Modified Rankin (Stroke Patients Only)       Balance Overall balance assessment: Needs assistance Sitting-balance support: No upper extremity supported, Feet supported Sitting balance-Leahy Scale: Good     Standing balance support: Bilateral upper extremity supported Standing balance-Leahy Scale: Poor Standing balance comment: UE support and min guard and static standing                            Cognition Arousal/Alertness: Awake/alert Behavior During Therapy: WFL for tasks assessed/performed Overall Cognitive Status: Within Functional Limits for tasks assessed                                          Exercises General Exercises - Lower Extremity Long Arc Quad: Both, 10 reps, Seated    General Comments  Pertinent Vitals/Pain Pain Assessment Pain Assessment: Faces Faces Pain Scale: Hurts a little bit Pain Location: chest incision Pain Descriptors / Indicators: Aching Pain Intervention(s): Monitored during session    Home Living                          Prior Function            PT Goals (current goals can now be found in the care plan  section) Acute Rehab PT Goals Patient Stated Goal: return home Potential to Achieve Goals: Good Progress towards PT goals: Progressing toward goals    Frequency    Min 3X/week      PT Plan Current plan remains appropriate    Co-evaluation              AM-PAC PT "6 Clicks" Mobility   Outcome Measure  Help needed turning from your back to your side while in a flat bed without using bedrails?: A Little Help needed moving from lying on your back to sitting on the side of a flat bed without using bedrails?: A Little Help needed moving to and from a bed to a chair (including a wheelchair)?: A Little Help needed standing up from a chair using your arms (e.g., wheelchair or bedside chair)?: A Lot Help needed to walk in hospital room?: A Little Help needed climbing 3-5 steps with a railing? : Total 6 Click Score: 15    End of Session Equipment Utilized During Treatment: Oxygen Activity Tolerance: Patient tolerated treatment well Patient left: with call bell/phone within reach;with family/visitor present;Other (comment) (on [facility]) Nurse Communication: Mobility status PT Visit Diagnosis: Other abnormalities of gait and mobility (R26.89);Unsteadiness on feet (R26.81);History of falling (Z91.81)     Time: 5300-5110 PT Time Calculation (min) (ACUTE ONLY): 42 min  Charges:  $Therapeutic Activity: 38-52 mins                     Wyona Almas, PT, DPT Acute Rehabilitation Services Office [phone]    Deno Etienne 11/19/2022, 4:01 PM

## 2022-11-19 NOTE — Progress Notes (Signed)
Pt Is wearing a home CPAP unit on home settings with supplemental 02 of 31% . Pt tolerating well at this time.

## 2022-11-19 NOTE — Progress Notes (Signed)
NAME:  James Torres, MRN:  [id number], DOB:  [birth year], LOS: 9 ADMISSION DATE:  11/10/2022, CONSULTATION DATE:  1/9 REFERRING MD:  Dr. Stanford Breed, CHIEF COMPLAINT:  cabg x1   History of Present Illness:  Patient is a 57 yo male w/ pertinent pmh of CAD, ischemic cardiomyopathy, HLD, HTN, seizure on aeds presents to [facility] on 1/6 with chest pain.  1/6 had acute onset chest pain came to [facility] ED for eval. EKG w/ inferior ST depression. Trop 177 then 4400. Treated w/ asa, BB, statin, heparin iv. Cards following. Left heart cath showing 99% stenosis proximal LAD. TCTS consulted for potential CABG. On 1/9 CABG x1 performed. Post intubated. PCCM consulted.  Pertinent  Medical History   Past Medical History:  Diagnosis Date   Anxiety    Brain tumor ([place])    CAD (coronary artery disease)    a. 03/2015 NSTEMI: LM nl, LAD 50ost, 100p (2.75x38 Synergy DES), 75d, RI nl, LCX minor irregs, OM1 min irregs, RCA 50ost, EF 35-45%.   Depression    GERD (gastroesophageal reflux disease)    Hyperlipidemia    Ischemic cardiomyopathy    a. 03/2015 EF 35-45% by LV gram @ time of NSTEMI;  b. 03/2015 Echo: EF 50-55%, sev HK to DK of dist an, apical, and infap walls.Gr 1 DD.   Kidney stone    Metabolic syndrome 0/93/8182   Migraines    Morbid obesity ([place])    Pre-diabetes    Seizures ([place])    a. Has auras and metalic taste in mouth.  Last one 06/2014- last a few seconds, has one every couple weeks, Dr Mare Loan is neurologist and is aware.    Sleep apnea 2009   a. On CPAP.   Tobacco abuse    Significant Hospital Events: Including procedures, antibiotic start and stop dates in addition to other pertinent events   1/9 cabg x 1; post op intubated; pccm consulted 1/10: patient self extubated; placed on bipap 100%. 1/11 might have had some seizures this morning. Off BiPAP Diuresing  1/12 HFNC 12L. Icnr to Bid diuresis.  Was able to ambulate. 1/14 cont diuresis   Interim History / Subjective:  Tolerated BiPAP  overnight Currently on 2 L nasal cannula oxygen Stated feeling much better  Objective   Blood pressure 126/75, pulse 86, temperature 98 F (36.7 C), temperature source Axillary, resp. rate (!) 23, height 6' (1.829 m), weight (!) 160.7 kg, SpO2 100 %.    Vent Mode: BIPAP;PCV FiO2 (%):  [40 %] 40 % Set Rate:  [14 bmp] 14 bmp PEEP:  [8 cmH20] 8 cmH20   Intake/Output Summary (Last 24 hours) at 11/19/2022 9937 Last data filed at 11/19/2022 0900 Gross per 24 hour  Intake 1481.57 ml  Output 3677 ml  Net -2195.43 ml   Filed Weights   11/17/22 0600 11/18/22 0600 11/19/22 0600  Weight: (!) 166.7 kg (!) 166.8 kg (!) 160.7 kg   Examination: Physical exam: General: Middle-age morbidly obese male, lying on the bed, on nasal cannula oxygen HEENT: [place]/AT, eyes anicteric.  moist mucus membranes Neuro: Alert, awake following commands Chest: Central sternotomy wound looks clean and dry, coarse breath sounds, no wheezes or rhonchi Heart: Regular rate and rhythm, no murmurs or gallops Abdomen: Soft, nontender, nondistended, bowel sounds present Skin: No rash  Ancillary tests personally reviewed  Creatinine  1.1 Na 133 K 3.4  Assessment & Plan:  Acute respiratory failure w hypoxia Possible bilateral multifocal pneumonia OSA on CPAP Acute on chronic HFrEF  with pulm edema Continue to titrate nasal cannula oxygen Patient used BiPAP overnight, she feels much better Currently on 2 L nasal cannula oxygen Continue diuresis with Lasix, hold metolazone Monitor intake and output Continue IV cefepime Follow-up respiratory culture  CAD s/p CABG x 1 Continue aspirin and Crestor Continue metoprolol 25 mg twice daily  Seizure disorder Continue Keppra and lamotrigine  Anxiety/depression Continue Klonopin and Celexa  Prediabetes Patient hemoglobin A1c 6.1 Continue Semglee and sliding scale insulin with CBG goal 140-180  Morbid obesity Counseling provided regarding diet and  exercise Dietitian following  Best Practice (right click and "Reselect all SmartList Selections" daily)   Diet/type: Regular consistency (see orders) DVT prophylaxis: Enoxaparin GI prophylaxis: PPI Lines: N/A Foley: External urinary catheter Code Status:  full code Last date of multidisciplinary goals of care discussion [patient updated at bedside.]    Jacky Kindle, MD [place] [facility] See Amion for pager If no response to pager, please call [phone] until 7pm After 7pm, Please call E-link [phone]

## 2022-11-19 NOTE — Progress Notes (Signed)
Initial Nutrition Assessment  DOCUMENTATION CODES:   Morbid obesity  INTERVENTION:   Ensure Enlive po TID, each supplement provides 350 kcal and 20 grams of protein, until appetite improves. Ok to decrease or discontinue once po intake improved  If po intake remains inadequate due to poor appetite post op, recommend liberalizing diet to REGULAR  Pt would likely benefit from referral for Outpatient Nutrition Education at baseline  NUTRITION DIAGNOSIS:   Inadequate oral intake related to acute illness, poor appetite, nausea as evidenced by meal completion < 50%.  GOAL:   Patient will meet greater than or equal to 90% of their needs  MONITOR:   PO intake, Supplement acceptance, Labs, Weight trends, Skin  REASON FOR ASSESSMENT:   Rounds    ASSESSMENT:   57 yo male admitted with chest pain on 11/10/21 with NSTEMI, L heart cath with 99% stenosis proximal LAD and underwent CABG x 1 on 11/13/21. PMH includes CAD, depression, brain tumor, HLD, ischemic CM, seizures, pre-DM  1/09 CABG x 1 1/10 Self-Extubated to Bipap 1/11 ?seizure activity, off BiPap 1/12 HFNC  Currently on 2L , Bipap at night  Noted Cortrak order placed on 1/13 at 9am.  Pt reports appetite is poor.  Recorded po intake post op has been 0-100%, 40% on average. This AM pt reports he did not eat breakfast, just received lunch. Pt agreeable to trying oral nutrition supplements.   Recommend holding off on Cortrak placement for now  Reports some nausea at times but no vomiting. Noted reglan started today  Labs: CBGs 118-169, sodium 133, potassium 3.4, Creatinine wdl Meds: ss novolog, levemir, lasix, dulcolax, colace, reglan, zofran prn, miralax, KCl   Diet Order:   Diet Order             Diet Heart Room service appropriate? Yes; Fluid consistency: Thin  Diet effective now                   EDUCATION NEEDS:   Education needs have been addressed  Skin:  Skin Assessment: Skin Integrity Issues: Skin  Integrity Issues:: Wound VAC Wound Vac: chest  Last BM:  1/14 small type 5  Height:   Ht Readings from Last 1 Encounters:  11/13/22 6' (1.829 m)    Weight:   Wt Readings from Last 1 Encounters:  11/19/22 (!) 160.7 kg    BMI:  Body mass index is 48.05 kg/m.  Estimated Nutritional Needs:   Kcal:  2000-2200 kcals  Protein:  125-145 g  Fluid:  >/= 2 L    Kerman Passey MS, RDN, LDN, CNSC Registered Dietitian 3 Clinical Nutrition RD Pager and On-Call Pager Number Located in [place]

## 2022-11-19 NOTE — Progress Notes (Signed)
Inpatient Rehab Admissions Coordinator:  Pt not medically ready for potential CIR admission. Will continue to follow.   Gayland Curry, [place], [place] Admissions Coordinator [phone]

## 2022-11-19 NOTE — Progress Notes (Signed)
Occupational Therapy Treatment Patient Details Name: James Torres MRN: [id number] DOB: December 11, 1965 Today's Date: 11/19/2022   History of present illness Pt is 57 year old presented to [facility] on  11/10/22 with chest pain. Pt with NSTEMI and underwent CABG x 1 on 11/13/22. Self extubated 1/10.  PMH - CAD, PCI, morbid obesity, benign brain tumor, seizure   OT comments  [name] is making solid progress but remains limited by incisional pain, unsteady gait, knowledge of sternal precautions and compensatory techniques. Pt was on BSC upon arrival, he stood with min A to steady and needed cues for safe use of UEs. He continues to need max A for rear peri care in standing due to sternal precautions as well as max A for bed mobility. Educational handout provided to pt and his wife and reviewed, they both verbalized great understanding. OT to continue to follow acutely. D?c recommendation changed to AIR for maximal functional progress prior to d/c home.    Recommendations for follow up therapy are one component of a multi-disciplinary discharge planning process, led by the attending physician.  Recommendations may be updated based on patient status, additional functional criteria and insurance authorization.    Follow Up Recommendations  Acute inpatient rehab (3hours/day)     Assistance Recommended at Discharge Frequent or constant Supervision/Assistance  Patient can return home with the following  A little help with walking and/or transfers;A lot of help with bathing/dressing/bathroom;Assistance with cooking/housework;Assist for transportation;Help with stairs or ramp for entrance   Equipment Recommendations  Other (comment)    Recommendations for Other Services Rehab consult    Precautions / Restrictions Precautions Precautions: Fall;Sternal;Other (comment) (seizure) Precaution Booklet Issued: Yes (comment) Precaution Comments: Pt with partial petit mal seizures. Restrictions Weight Bearing  Restrictions: Yes (sternal precautions) Other Position/Activity Restrictions: sternal precautions       Mobility Bed Mobility Overal bed mobility: Needs Assistance Bed Mobility: Sit to Sidelying, Rolling Rolling: Min assist       Sit to sidelying: Max assist General bed mobility comments: cues and assist for LEs    Transfers Overall transfer level: Needs assistance Equipment used: Rolling walker (2 wheels) Transfers: Sit to/from Stand Sit to Stand: Min assist           General transfer comment: min A to steady with standing, cues for hand placement     Balance Overall balance assessment: Needs assistance Sitting-balance support: No upper extremity supported, Feet supported Sitting balance-Leahy Scale: Good     Standing balance support: Single extremity supported, Bilateral upper extremity supported Standing balance-Leahy Scale: Poor                 ADL either performed or assessed with clinical judgement   ADL Overall ADL's : Needs assistance/impaired                         Toilet Transfer: Minimal assistance;Rolling walker (2 wheels);BSC/3in1   Toileting- Clothing Manipulation and Hygiene: Maximal assistance;Sit to/from stand Toileting - Clothing Manipulation Details (indicate cue type and reason): peri care in standing     Functional mobility during ADLs: Minimal assistance;Rolling walker (2 wheels) General ADL Comments: cues for sternal cues. unsteady upon standing    Extremity/Trunk Assessment Upper Extremity Assessment Upper Extremity Assessment: Overall WFL for tasks assessed (within precautions)   Lower Extremity Assessment Lower Extremity Assessment: Defer to PT evaluation        Vision   Vision Assessment?: No apparent visual deficits   Perception Perception Perception:  Not tested   Praxis Praxis Praxis: Not tested    Cognition Arousal/Alertness: Awake/alert Behavior During Therapy: WFL for tasks  assessed/performed Overall Cognitive Status: Within Functional Limits for tasks assessed           General Comments: delayed recall of sternal precautions, looking to wife for answers. provided educational hand out for sternal precautions, verbally reviewed              General Comments VSS on RA at rest, needed [place] with activity. wife present and supportive    Pertinent Vitals/ Pain       Pain Assessment Pain Assessment: Faces Faces Pain Scale: Hurts a little bit Pain Location: chest Pain Descriptors / Indicators: Aching Pain Intervention(s): Limited activity within patient's tolerance, Monitored during session   Frequency  Min 2X/week        Progress Toward Goals  OT Goals(current goals can now be found in the care plan section)  Progress towards OT goals: Progressing toward goals  Acute Rehab OT Goals OT Goal Formulation: With patient Time For Goal Achievement: 11/29/22 Potential to Achieve Goals: Good ADL Goals Pt Will Perform Grooming: with supervision;standing Pt Will Transfer to Toilet: ambulating;with supervision;bedside commode Pt Will Perform Toileting - Clothing Manipulation and hygiene: with min assist;sit to/from stand Pt Will Perform Tub/Shower Transfer: with supervision;Shower transfer;with caregiver independent in assisting;ambulating;3 in 1;rolling walker Additional ADL Goal #1: Pt will perform bed mobility with supervision in preparation for ADLs. Additional ADL Goal #2: Pt will employ energy conservation strategies during ADLs and mobility. Additional ADL Goal #3: Pt will adhere to sternal precautions during ADLs and mobility.  Plan Discharge plan needs to be updated       AM-PAC OT "6 Clicks" Daily Activity     Outcome Measure   Help from another person eating meals?: None Help from another person taking care of personal grooming?: A Little Help from another person toileting, which includes using toliet, bedpan, or urinal?: A Lot Help from  another person bathing (including washing, rinsing, drying)?: A Lot Help from another person to put on and taking off regular upper body clothing?: A Lot Help from another person to put on and taking off regular lower body clothing?: A Lot 6 Click Score: 15    End of Session Equipment Utilized During Treatment: Gait belt;Rolling walker (2 wheels)  OT Visit Diagnosis: Unsteadiness on feet (R26.81);Other abnormalities of gait and mobility (R26.89);Pain;Muscle weakness (generalized) (M62.81)   Activity Tolerance Patient tolerated treatment well   Patient Left in bed;with call bell/phone within reach;with family/visitor present   Nurse Communication Mobility status;Other (comment)        Time: 6256-3893 OT Time Calculation (min): 18 min  Charges: OT General Charges $OT Visit: 1 Visit OT Treatments $Self Care/Home Management : 8-22 mins    Elliot Cousin 11/19/2022, 4:04 PM

## 2022-11-19 NOTE — Progress Notes (Addendum)
TCTS DAILY ICU PROGRESS NOTE                   [address]            [place] [address]          [phone]   6 Days Post-Op Procedure(s) (LRB): OFF PUMP CORONARY ARTERY BYPASS GRAFTING (CABG) X ONE BYPASS USING LEFT INTERNAL MAMMARY ARTERY. (N/A) TRANSESOPHAGEAL ECHOCARDIOGRAM (TEE) (N/A)  Total Length of Stay:  LOS: 9 days   Subjective: Patient sitting in chair. Wife at bedside. He has occasional nausea (no vomiting).  Objective: Vital signs in last 24 hours: Temp:  [98 F (36.7 C)-98.9 F (37.2 C)] 98 F (36.7 C) (01/15 0300) Pulse Rate:  [78-106] 86 (01/15 0900) Cardiac Rhythm: Normal sinus rhythm (01/15 0400) Resp:  [11-28] 23 (01/15 0900) BP: (99-139)/(63-102) 126/75 (01/15 0800) SpO2:  [82 %-100 %] 100 % (01/15 0900) FiO2 (%):  [40 %] 40 % (01/15 0400) Weight:  [160.7 kg] 160.7 kg (01/15 0600)  Filed Weights   11/17/22 0600 11/18/22 0600 11/19/22 0600  Weight: (!) 166.7 kg (!) 166.8 kg (!) 160.7 kg    Weight change: -6.1 kg    Intake/Output from previous day: 01/14 0701 - 01/15 0700 In: 1361.6 [P.O.:1180; IV Piggyback:181.6] Out: 4277 [Urine:4275; Stool:2]  Intake/Output this shift: Total I/O In: 240 [P.O.:240] Out: -   Current Meds: Scheduled Meds:  aspirin EC  325 mg Oral Daily   Or   aspirin  324 mg Per Tube Daily   bisacodyl  10 mg Oral Daily   Or   bisacodyl  10 mg Rectal Daily   Chlorhexidine Gluconate Cloth  6 each Topical Daily   citalopram  40 mg Oral q morning   docusate sodium  200 mg Oral Daily   enoxaparin (LOVENOX) injection  40 mg Subcutaneous Q24H   furosemide  40 mg Intravenous BID   insulin aspart  0-24 Units Subcutaneous Q4H   insulin detemir  10 Units Subcutaneous Daily   lamoTRIgine  200 mg Oral Daily   levETIRAcetam  1,000 mg Oral Q12H   metoprolol tartrate  25 mg Oral BID   mouth rinse  15 mL Mouth Rinse 4 times per day   pantoprazole  80 mg Oral Daily   perampanel  8 mg Oral QHS   polyethylene  glycol  17 g Oral BID   potassium chloride  20 mEq Oral Q4H   rosuvastatin  40 mg Oral QHS   sodium chloride flush  3 mL Intravenous Q12H   Continuous Infusions:  sodium chloride Stopped (11/15/22 0629)   ceFEPime (MAXIPIME) IV Stopped (11/19/22 6734)   lactated ringers     lactated ringers Stopped (11/14/22 2304)   PRN Meds:.acetaminophen, clonazePAM, metoprolol tartrate, morphine injection, ondansetron (ZOFRAN) IV, mouth rinse, oxyCODONE, sodium chloride flush, traMADol  General appearance: alert, cooperative, and no distress Neurologic: intact Heart: RRR Lungs: Diminished bibasilar breath sounds L>R Abdomen: Soft, obese, bowel sounds, non tender Extremities: Mild bilateral LE edema Wound: Prevena in place  Lab Results: CBC: Recent Labs    11/17/22 0441 11/19/22 0048  WBC 11.2* 11.2*  HGB 14.7 14.9  HCT 42.9 42.2  PLT 157 195   BMET:  Recent Labs    11/18/22 0652 11/19/22 0048  NA 134* 133*  K 3.5 3.4*  CL 91* 87*  CO2 34* 29  GLUCOSE 127* 119*  BUN 11 15  CREATININE 1.02 1.10  CALCIUM 9.0 9.3    CMET:  Lab Results  Component Value Date   WBC 11.2 (H) 11/19/2022   HGB 14.9 11/19/2022   HCT 42.2 11/19/2022   PLT 195 11/19/2022   GLUCOSE 119 (H) 11/19/2022   CHOL 136 11/10/2022   TRIG 120 11/10/2022   HDL 41 11/10/2022   LDLCALC 71 11/10/2022   ALT 26 11/10/2022   AST 33 11/10/2022   NA 133 (L) 11/19/2022   K 3.4 (L) 11/19/2022   CL 87 (L) 11/19/2022   CREATININE 1.10 11/19/2022   BUN 15 11/19/2022   CO2 29 11/19/2022   TSH 2.001 11/10/2022   INR 1.2 11/13/2022   HGBA1C 6.1 (H) 11/10/2022      PT/INR: No results for input(s): "LABPROT", "INR" in the last 72 hours. Radiology: DG CHEST PORT 1 VIEW  Result Date: 11/19/2022 CLINICAL DATA:  Pneumonia. EXAM: PORTABLE CHEST 1 VIEW COMPARISON:  11/17/2022 FINDINGS: Prior median sternotomy. There is subsegmental atelectasis at the RIGHT lung base. Stable hazy opacity at the LEFT lung base suggesting  pleural effusion. IMPRESSION: 1. Stable LEFT effusion. 2. Subsegmental atelectasis at the RIGHT lung base. Electronically Signed   By: Nolon Nations M.D.   On: 11/19/2022 07:29     Assessment/Plan: S/P Procedure(s) (LRB): OFF PUMP CORONARY ARTERY BYPASS GRAFTING (CABG) X ONE BYPASS USING LEFT INTERNAL MAMMARY ARTERY. (N/A) TRANSESOPHAGEAL ECHOCARDIOGRAM (TEE) (N/A) CV-S/p NSTEMI. On Lopressor 25 mg bid Pulmonary-He was placed on bi pap over night. He has a history of OSA-on CPAP. He is on 2 liters of oxygen via [place] this am. Wean as able over next few days. CXR this am shows atelectasis right base and left pleural effusion. Encourage incentive spirometer and flutter valve Volume overload-on Lasix 40 mg IV bid 4. CBGs 140/121/145. On Insulin. Pre op HGA1C 6.1. Will stop accu checks and SS PRN upon transfer. 5. Supplement potassium 6. ID-on Cefepime for possible PNA 7. History of seizures-continue Keppra XR, Fycompa, and Lamictal. Per discussion with patient, Lamictal to be given bid. Also, he has taken Klonopin 0.5 mg bid for years (not PRN) so will change as well 8. GI-Will give scheduled Reglan, continue Zofran PRN 9. Hope to transfer to 4E soon  Nani Skillern PA-C 11/19/2022 9:28 AM  Agree with above excellent assessment and plan Appreciate CCM input on respiratory management Ongoing diuresis Continue aggressive pulm toilet and ambulation

## 2022-11-20 ENCOUNTER — Inpatient Hospital Stay (HOSPITAL_COMMUNITY): Payer: Medicare PPO

## 2022-11-20 DIAGNOSIS — Z951 Presence of aortocoronary bypass graft: Secondary | ICD-10-CM | POA: Diagnosis not present

## 2022-11-20 DIAGNOSIS — J9601 Acute respiratory failure with hypoxia: Secondary | ICD-10-CM | POA: Diagnosis not present

## 2022-11-20 DIAGNOSIS — D696 Thrombocytopenia, unspecified: Secondary | ICD-10-CM | POA: Diagnosis not present

## 2022-11-20 DIAGNOSIS — I214 Non-ST elevation (NSTEMI) myocardial infarction: Secondary | ICD-10-CM | POA: Diagnosis not present

## 2022-11-20 LAB — CBC
HCT: 42.7 % (ref 39.0–52.0)
Hemoglobin: 15.1 g/dL (ref 13.0–17.0)
MCH: 31.4 pg (ref 26.0–34.0)
MCHC: 35.4 g/dL (ref 30.0–36.0)
MCV: 88.8 fL (ref 80.0–100.0)
Platelets: 224 10*3/uL (ref 150–400)
RBC: 4.81 MIL/uL (ref 4.22–5.81)
RDW: 13.7 % (ref 11.5–15.5)
WBC: 11.7 10*3/uL — ABNORMAL HIGH (ref 4.0–10.5)
nRBC: 0 % (ref 0.0–0.2)

## 2022-11-20 LAB — BASIC METABOLIC PANEL
Anion gap: 15 (ref 5–15)
BUN: 27 mg/dL — ABNORMAL HIGH (ref 6–20)
CO2: 30 mmol/L (ref 22–32)
Calcium: 9.6 mg/dL (ref 8.9–10.3)
Chloride: 85 mmol/L — ABNORMAL LOW (ref 98–111)
Creatinine, Ser: 1.14 mg/dL (ref 0.61–1.24)
GFR, Estimated: >60 mL/min (ref >60)
Glucose, Bld: 139 mg/dL — ABNORMAL HIGH (ref 70–99)
Potassium: 3.1 mmol/L — ABNORMAL LOW (ref 3.5–5.1)
Sodium: 130 mmol/L — ABNORMAL LOW (ref 135–145)

## 2022-11-20 LAB — GLUCOSE, CAPILLARY
Glucose-Capillary: 125 mg/dL — ABNORMAL HIGH (ref 70–99)
Glucose-Capillary: 156 mg/dL — ABNORMAL HIGH (ref 70–99)
Glucose-Capillary: 174 mg/dL — ABNORMAL HIGH (ref 70–99)
Glucose-Capillary: 176 mg/dL — ABNORMAL HIGH (ref 70–99)
Glucose-Capillary: 185 mg/dL — ABNORMAL HIGH (ref 70–99)
Glucose-Capillary: 210 mg/dL — ABNORMAL HIGH (ref 70–99)

## 2022-11-20 MED ORDER — POTASSIUM CHLORIDE CRYS ER 20 MEQ PO TBCR
20.0000 meq | EXTENDED_RELEASE_TABLET | ORAL | Status: AC
  Administered 2022-11-20 (×4): 20 meq via ORAL
  Filled 2022-11-20 (×3): qty 1

## 2022-11-20 MED ORDER — POTASSIUM CHLORIDE CRYS ER 20 MEQ PO TBCR
20.0000 meq | EXTENDED_RELEASE_TABLET | ORAL | Status: DC
  Filled 2022-11-20: qty 1

## 2022-11-20 MED ORDER — INSULIN DETEMIR 100 UNIT/ML ~~LOC~~ SOLN
12.0000 [IU] | Freq: Every day | SUBCUTANEOUS | Status: DC
  Filled 2022-11-20: qty 0.12

## 2022-11-20 NOTE — Progress Notes (Signed)
Pt placed on home cpap with 3L bleed in. Pt tolerating well with svs.

## 2022-11-20 NOTE — Progress Notes (Signed)
NAME:  James Torres, MRN:  [id number], DOB:  [birth year], LOS: 67 ADMISSION DATE:  11/10/2022, CONSULTATION DATE:  1/9 REFERRING MD:  Dr. Stanford Breed, CHIEF COMPLAINT:  cabg x1   History of Present Illness:  Patient is a 57 yo male w/ pertinent pmh of CAD, ischemic cardiomyopathy, HLD, HTN, seizure on aeds presents to [facility] on 1/6 with chest pain.  1/6 had acute onset chest pain came to [facility] ED for eval. EKG w/ inferior ST depression. Trop 177 then 4400. Treated w/ asa, BB, statin, heparin iv. Cards following. Left heart cath showing 99% stenosis proximal LAD. TCTS consulted for potential CABG. On 1/9 CABG x1 performed. Post intubated. PCCM consulted.  Pertinent  Medical History   Past Medical History:  Diagnosis Date   Anxiety    Brain tumor ([place])    CAD (coronary artery disease)    a. 03/2015 NSTEMI: LM nl, LAD 50ost, 100p (2.75x38 Synergy DES), 75d, RI nl, LCX minor irregs, OM1 min irregs, RCA 50ost, EF 35-45%.   Depression    GERD (gastroesophageal reflux disease)    Hyperlipidemia    Ischemic cardiomyopathy    a. 03/2015 EF 35-45% by LV gram @ time of NSTEMI;  b. 03/2015 Echo: EF 50-55%, sev HK to DK of dist an, apical, and infap walls.Gr 1 DD.   Kidney stone    Metabolic syndrome 8/33/8250   Migraines    Morbid obesity ([place])    Pre-diabetes    Seizures ([place])    a. Has auras and metalic taste in mouth.  Last one 06/2014- last a few seconds, has one every couple weeks, Dr Mare Loan is neurologist and is aware.    Sleep apnea 2009   a. On CPAP.   Tobacco abuse    Significant Hospital Events: Including procedures, antibiotic start and stop dates in addition to other pertinent events   1/9 cabg x 1; post op intubated; pccm consulted 1/10: patient self extubated; placed on bipap 100%. 1/11 might have had some seizures this morning. Off BiPAP Diuresing  1/12 HFNC 12L. Icnr to Bid diuresis.  Was able to ambulate. 1/14 cont diuresis   Interim History / Subjective:  Tolerated his own  CPAP last night Currently on 2 L nasal cannula oxygen  Objective   Blood pressure 109/68, pulse 87, temperature 99.7 F (37.6 C), temperature source Oral, resp. rate 18, height 6' (1.829 m), weight (!) 165.2 kg, SpO2 93 %.        Intake/Output Summary (Last 24 hours) at 11/20/2022 0729 Last data filed at 11/20/2022 0200 Gross per 24 hour  Intake 823 ml  Output 3250 ml  Net -2427 ml   Filed Weights   11/18/22 0600 11/19/22 0600 11/20/22 0500  Weight: (!) 166.8 kg (!) 160.7 kg (!) 165.2 kg   Examination: Physical exam: General: Middle-age morbidly obese male, sitting on the couch HEENT: [place]/AT, eyes anicteric.  moist mucus membranes Neuro: Alert, awake following commands Chest: Reduced air entry at the bases bilaterally, no wheezes or rhonchi Heart: Regular rate and rhythm, no murmurs or gallops Abdomen: Soft, nontender, nondistended, bowel sounds present Skin: No rash  Ancillary tests personally reviewed  Creatinine  1.14 Na 130 K 3.1  Assessment & Plan:  Acute respiratory failure w hypoxia Possible bilateral multifocal pneumonia OSA on CPAP Acute on chronic HFrEF with pulm edema Continue to titrate nasal cannula oxygen, currently on 2 L Patient used his home CPAP overnight, feels much better Continue diuresis with Lasix Monitor intake and output  Continue IV cefepime  Sputum culture is negative so far  CAD s/p CABG x 1 Continue aspirin and Crestor Continue metoprolol 25 mg twice daily  Seizure disorder Continue Keppra and lamotrigine  Anxiety/depression Continue Klonopin and Celexa  Prediabetes Patient hemoglobin A1c 6.1 Increase Semglee to 12 units Continue sliding scale insulin with CBG goal 140-180  Morbid obesity Counseling provided regarding diet and exercise Dietitian following  Best Practice (right click and "Reselect all SmartList Selections" daily)   Diet/type: Regular consistency (see orders) DVT prophylaxis: Enoxaparin GI prophylaxis:  PPI Lines: N/A Foley: External urinary catheter Code Status:  full code Last date of multidisciplinary goals of care discussion 1/16: [patient updated at bedside.]    Jacky Kindle, MD [facility] See Amion for pager If no response to pager, please call [phone] until 7pm After 7pm, Please call E-link [phone]

## 2022-11-20 NOTE — Progress Notes (Signed)
[address]       [place] [address]             [phone]      7 Days Post-Op  Procedure(s) (LRB): OFF PUMP CORONARY ARTERY BYPASS GRAFTING (CABG) X ONE BYPASS USING LEFT INTERNAL MAMMARY ARTERY. (N/A) TRANSESOPHAGEAL ECHOCARDIOGRAM (TEE) (N/A)  Total Length of Stay:  LOS: 10 days   SUBJECTIVE: Feels better today and ambulating well  Vitals:   11/20/22 0800 11/20/22 0825  BP:    Pulse: 90   Resp: (!) 26   Temp:  98.7 F (37.1 C)  SpO2: 99%     Intake/Output      01/15 0701 01/16 0700 01/16 0701 01/17 0700   P.O. 720    I.V. (mL/kg) 3 (0)    IV Piggyback 200 100   Total Intake(mL/kg) 923 (5.6) 100 (0.6)   Urine (mL/kg/hr) 3250 (0.8)    Emesis/NG output     Stool     Total Output 3250    Net -2327 +100            sodium chloride Stopped (11/15/22 0629)   sodium chloride     ceFEPime (MAXIPIME) IV Stopped (11/20/22 0742)   lactated ringers      CBC    Component Value Date/Time   WBC 11.7 (H) 11/20/2022 0419   RBC 4.81 11/20/2022 0419   HGB 15.1 11/20/2022 0419   HCT 42.7 11/20/2022 0419   PLT 224 11/20/2022 0419   MCV 88.8 11/20/2022 0419   MCV 92.9 01/19/2012 1922   MCH 31.4 11/20/2022 0419   MCHC 35.4 11/20/2022 0419   RDW 13.7 11/20/2022 0419   LYMPHSABS 2.2 11/19/2022 0048   MONOABS 1.1 (H) 11/19/2022 0048   EOSABS 0.1 11/19/2022 0048   BASOSABS 0.1 11/19/2022 0048   CMP     Component Value Date/Time   NA 130 (L) 11/20/2022 0419   NA 142 09/02/2017 1006   K 3.1 (L) 11/20/2022 0419   CL 85 (L) 11/20/2022 0419   CO2 30 11/20/2022 0419   GLUCOSE 139 (H) 11/20/2022 0419   BUN 27 (H) 11/20/2022 0419   BUN 12 09/02/2017 1006   CREATININE 1.14 11/20/2022 0419   CALCIUM 9.6 11/20/2022 0419   PROT 6.6 11/10/2022 0832   PROT 6.5 09/02/2017 1006   ALBUMIN 3.7 11/10/2022 0832   ALBUMIN 4.3 09/02/2017 1006   AST 33 11/10/2022 0832   ALT 26 11/10/2022 0832   ALKPHOS 74 11/10/2022 0832   BILITOT 0.5 11/10/2022 0832    BILITOT 0.4 09/02/2017 1006   GFRNONAA >60 11/20/2022 0419   GFRAA 101 09/02/2017 1006   ABG    Component Value Date/Time   PHART 7.346 (L) 11/15/2022 0501   PCO2ART 43.7 11/15/2022 0501   PO2ART 65 (L) 11/15/2022 0501   HCO3 23.7 11/15/2022 0501   TCO2 25 11/15/2022 0501   ACIDBASEDEF 2.0 11/15/2022 0501   O2SAT 90 11/15/2022 0501   CBG (last 3)  Recent Labs    11/19/22 2359 11/20/22 0715 11/20/22 0823  GLUCAP 156* 185* 210*  EXAM Lungs:decreased at bases Card: rr Incisions intact Ext: less edematous Neuro: alert    ASSESSMENT: POD #7 SP CABG Respiratory status improving. On just nasal canula Cont diuresis Has order to transfer   Coralie Common, MD 11/20/2022

## 2022-11-20 NOTE — Progress Notes (Signed)
Pt stated he would tell nurse to call RT when he was ready for his CPAP. Pt was not ready to sleep.

## 2022-11-20 NOTE — Progress Notes (Signed)
Inpatient Rehabilitation Admissions Coordinator   I met at bedside with patient and girlfriend. We discussed estimated cost of care if approved for CIR. I began [place] with [facility] Medicare today. I answered questions concerning level of care and follow up after a CIR admit.  Danne Baxter, RN, MSN Rehab Admissions Coordinator [phone] 11/20/2022 11:23 AM

## 2022-11-20 NOTE — Progress Notes (Signed)
Came to assist pt with amb but pt declined d/t 10/10 headache. Family in room had questions about status of inpatient rehab. Talked to pt about [facility], pt is interested but doesn't drive so attendance could be a problem Family wants [place] to have evening classes. Encouraged pt to walk when feeling better, will f/u with pt if time allows. Referral placed to [facility].    Christen Bame 11/20/2022 11:10 AM   [phone]

## 2022-11-21 LAB — GLUCOSE, CAPILLARY
Glucose-Capillary: 119 mg/dL — ABNORMAL HIGH (ref 70–99)
Glucose-Capillary: 138 mg/dL — ABNORMAL HIGH (ref 70–99)
Glucose-Capillary: 121 mg/dL — ABNORMAL HIGH (ref 70–99)
Glucose-Capillary: 131 mg/dL — ABNORMAL HIGH (ref 70–99)

## 2022-11-21 LAB — CBC
HCT: 41.5 % (ref 39.0–52.0)
Hemoglobin: 14.1 g/dL (ref 13.0–17.0)
MCH: 30.6 pg (ref 26.0–34.0)
MCHC: 34 g/dL (ref 30.0–36.0)
MCV: 90 fL (ref 80.0–100.0)
Platelets: 236 10*3/uL (ref 150–400)
RBC: 4.61 MIL/uL (ref 4.22–5.81)
RDW: 13.7 % (ref 11.5–15.5)
WBC: 10.1 10*3/uL (ref 4.0–10.5)
nRBC: 0 % (ref 0.0–0.2)

## 2022-11-21 LAB — BASIC METABOLIC PANEL
Anion gap: 13 (ref 5–15)
BUN: 28 mg/dL — ABNORMAL HIGH (ref 6–20)
CO2: 30 mmol/L (ref 22–32)
Calcium: 9.2 mg/dL (ref 8.9–10.3)
Chloride: 87 mmol/L — ABNORMAL LOW (ref 98–111)
Creatinine, Ser: 1.12 mg/dL (ref 0.61–1.24)
GFR, Estimated: >60 mL/min (ref >60)
Glucose, Bld: 123 mg/dL — ABNORMAL HIGH (ref 70–99)
Potassium: 3.2 mmol/L — ABNORMAL LOW (ref 3.5–5.1)
Sodium: 130 mmol/L — ABNORMAL LOW (ref 135–145)

## 2022-11-21 MED ORDER — LACTULOSE 10 GM/15ML PO SOLN
30.0000 g | Freq: Once | ORAL | Status: AC
  Administered 2022-11-21: 30 g via ORAL
  Filled 2022-11-21: qty 45

## 2022-11-21 MED ORDER — ASPIRIN 81 MG PO TBEC
81.0000 mg | DELAYED_RELEASE_TABLET | Freq: Every day | ORAL | Status: DC
  Administered 2022-11-21 – 2022-11-23 (×3): 81 mg via ORAL
  Filled 2022-11-21 (×3): qty 1

## 2022-11-21 MED ORDER — ASPIRIN 81 MG PO CHEW: 81.0000 mg | CHEWABLE_TABLET | Freq: Every day | ORAL | Status: DC

## 2022-11-21 MED ORDER — POTASSIUM CHLORIDE CRYS ER 20 MEQ PO TBCR
40.0000 meq | EXTENDED_RELEASE_TABLET | Freq: Three times a day (TID) | ORAL | Status: AC
  Administered 2022-11-21 (×3): 40 meq via ORAL
  Filled 2022-11-21 (×3): qty 2

## 2022-11-21 MED ORDER — POTASSIUM CHLORIDE CRYS ER 20 MEQ PO TBCR: 40.0000 meq | EXTENDED_RELEASE_TABLET | Freq: Three times a day (TID) | ORAL | Status: DC

## 2022-11-21 MED ORDER — FUROSEMIDE 10 MG/ML IJ SOLN
40.0000 mg | Freq: Every day | INTRAMUSCULAR | Status: DC
  Administered 2022-11-21: 40 mg via INTRAVENOUS
  Filled 2022-11-21: qty 4

## 2022-11-21 MED ORDER — CLOPIDOGREL BISULFATE 75 MG PO TABS
75.0000 mg | ORAL_TABLET | Freq: Every day | ORAL | Status: DC
  Administered 2022-11-21 – 2022-11-23 (×3): 75 mg via ORAL
  Filled 2022-11-21 (×3): qty 1

## 2022-11-21 NOTE — Progress Notes (Signed)
Mobility Specialist Progress Note:   11/21/22 1658  Mobility  Activity Ambulated with assistance in hallway  Level of Assistance Minimal assist, patient does 75% or more  Assistive Device Front wheel walker  Distance Ambulated (ft) 400 ft  Activity Response Tolerated well  $Mobility charge 1 Mobility   Pt in bed willing to participate in mobility. No complaints of pain. Required 1 seated rest break d/t fatigue. Left in chair with call bell in reach and all needs met.   Gareth Eagle Virda Betters Mobility Specialist Please contact via [employer] or  Rehab Office at [phone]

## 2022-11-21 NOTE — Progress Notes (Addendum)
[address]       [place] [address]             [phone]        8 Days Post-Op Procedure(s) (LRB): OFF PUMP CORONARY ARTERY BYPASS GRAFTING (CABG) X ONE BYPASS USING LEFT INTERNAL MAMMARY ARTERY. (N/A) TRANSESOPHAGEAL ECHOCARDIOGRAM (TEE) (N/A)  Subjective: Patient has not had bowel movement yet.  Objective: Vital signs in last 24 hours: Temp:  [97.9 F (36.6 C)-98.9 F (37.2 C)] 97.9 F (36.6 C) (01/17 0309) Pulse Rate:  [73-95] 74 (01/17 0600) Cardiac Rhythm: Normal sinus rhythm (01/17 0409) Resp:  [13-27] 19 (01/17 0309) BP: (104-122)/(60-75) 108/67 (01/17 0600) SpO2:  [90 %-100 %] 98 % (01/17 0600) FiO2 (%):  [32 %] 32 % (01/16 2339) Weight:  [164.7 kg] 164.7 kg (01/17 0500)  Pre op weight 164.7 kg Current Weight  11/21/22 (!) 164.7 kg      Intake/Output from previous day: 01/16 0701 - 01/17 0700 In: 1339.2 [P.O.:980; I.V.:59.2; IV Piggyback:300] Out: 3950 [Urine:3950]   Physical Exam:  Cardiovascular: RRR, Pulmonary:Slightly diminished bibasilar breath sounds Abdomen: Soft, non tender, bowel sounds present. Extremities: Mild bilateral lower extremity edema but has decreased over the last few days Wound: Clean and dry.  No erythema or signs of infection.  Lab Results: CBC: Recent Labs    11/20/22 0419 11/21/22 0148  WBC 11.7* 10.1  HGB 15.1 14.1  HCT 42.7 41.5  PLT 224 236   BMET:  Recent Labs    11/20/22 0419 11/21/22 0148  NA 130* 130*  K 3.1* 3.2*  CL 85* 87*  CO2 30 30  GLUCOSE 139* 123*  BUN 27* 28*  CREATININE 1.14 1.12  CALCIUM 9.6 9.2    PT/INR:  Lab Results  Component Value Date   INR 1.2 11/13/2022   INR 0.9 11/10/2022   ABG:  INR: Will add last result for INR, ABG once components are confirmed Will add last 4 CBG results once components are confirmed  Assessment/Plan: CV-S/p NSTEMI. On Lopressor 25 mg bid. Will discuss with surgeon if ok to start Plavix for ACS and will decrease ec asa  to 81 mg daily. Pulmonary-He was placed on bi pap over night. He has a history of OSA-on CPAP. On room air this am. Encourage incentive spirometer and flutter valve Volume overload-on Lasix 40 mg IV bid. 4. CBGs 176/174/119. On Insulin. Pre op HGA1C 6.1. Will stop accu checks and SS PRN  5. ID-on Cefepime for possible PNA 6. History of seizures-continue Keppra XR, Fycompa, and Lamictal. Per discussion with patient, Lamictal to be given bid. Also, he has taken Klonopin 0.5 mg bid for years (not PRN) so will change as well 7. H and H stable at 15.1 and 42.7 8. Supplement potassium 9. Hyponatremia-sodium this am remains 130. Likely related to diuresis  10. ID-on Cefepime for possible early PNA. Last dose today. Likely will not need oral antibiotic. No fever, WBC normal, and no PNA seen on CXR, and sputum culture result pending 11. LOC constipation 12. Likely will need CIR at discharge  [place] 7:16 AM  Agree with above DC antibiotics today Change to po diuretics tomorrow Awaiting CIR

## 2022-11-21 NOTE — Progress Notes (Signed)
Physical Therapy Treatment Patient Details Name: James Torres MRN: [id number] DOB: 04/08/1966 Today's Date: 11/21/2022   History of Present Illness Pt is 57 y.o. male admitted 11/10/22 with chest pain; workup for NSTEMI. S/p CABGx1 on 1/9; self-extubated 1/10. PMH includes CAD, PCI, morbid obesity, benign brain tumor, seizure, L hip arthritis (pt reports need for THA).   PT Comments    Pt progressing with mobility. Today's session focused on transfer and gait training with RW, ambulation and therex for improving strength and activity tolerance. Pt able to stand and walk with RW and min guard, though requires increasing assist to stand and maintain balance with fatigue. Pt motivated to participate and regain PLOF. Pt remains limited by generalized weakness, decreased activity tolerance, and impaired balance strategies/postural reactions. Continue to recommend intensive AIR-level therapies to maximize functional mobility and independence prior to return home.    Recommendations for follow up therapy are one component of a multi-disciplinary discharge planning process, led by the attending physician.  Recommendations may be updated based on patient status, additional functional criteria and insurance authorization.  Follow Up Recommendations  Acute inpatient rehab (3hours/day)     Assistance Recommended at Discharge Frequent or constant Supervision/Assistance  Patient can return home with the following A little help with walking and/or transfers;A little help with bathing/dressing/bathroom;Help with stairs or ramp for entrance;Assistance with cooking/housework   Equipment Recommendations   (TBD - bariatric rolling walker vs rollator)    Recommendations for Other Services Mobility Specialist      Precautions / Restrictions Precautions Precautions: Fall;Sternal;Other (comment) Precaution Comments: h/o partial petit mal seizures (per chart, pt typically knows when they are about to occur)      Mobility  Bed Mobility Overal bed mobility: Needs Assistance Bed Mobility: Supine to Sit Rolling: Min assist         General bed mobility comments: Good adherence to sternal precautions, increased time/effort, minA for trunk elevation    Transfers Overall transfer level: Needs assistance Equipment used: Rolling walker (2 wheels) Transfers: Sit to/from Stand Sit to Stand: Min guard, Min assist, Mod assist           General transfer comment: multiple sit<>stands from EOB and recliner to RW with min guard, cues for hands on knees to maintain sternal prec without holding heart pillow; pt requiring increasing assist with increased fatigue, regressing to min-modA for trunk elevation with 3x repeated sit<>stands post-ambulation    Ambulation/Gait Ambulation/Gait assistance: Min guard, Min assist Gait Distance (Feet): 30 Feet (+ 90' + 104') Assistive device: Rolling walker (2 wheels) Gait Pattern/deviations: Step-through pattern, Decreased stride length, Trunk flexed, Decreased weight shift to left Gait velocity: Decreased     General Gait Details: pt expressing potential for L knee instability, tennis shoes donned. slow, guarded gait with RW and min guard for balance; pt requiring 3x seated rest break secondary to fatigue/SOB, then progressive LLE instability with minA for stability   Stairs             Wheelchair Mobility    Modified Rankin (Stroke Patients Only)       Balance Overall balance assessment: Needs assistance Sitting-balance support: No upper extremity supported, Feet supported Sitting balance-Leahy Scale: Good     Standing balance support: Bilateral upper extremity supported, Single extremity supported Standing balance-Leahy Scale: Poor Standing balance comment: reliant on UE support  Cognition Arousal/Alertness: Awake/alert Behavior During Therapy: WFL for tasks assessed/performed Overall Cognitive  Status: Within Functional Limits for tasks assessed                                          Exercises      General Comments General comments (skin integrity, edema, etc.): pt's fiance present and supportive, able to provide chair follow for hallway ambulation. cues needed for activity pacing      Pertinent Vitals/Pain Pain Assessment Pain Assessment: Faces Faces Pain Scale: Hurts a little bit Pain Location: L hip Pain Descriptors / Indicators: Sore, Tiring Pain Intervention(s): Monitored during session, Limited activity within patient's tolerance    Home Living                          Prior Function            PT Goals (current goals can now be found in the care plan section) Progress towards PT goals: Progressing toward goals    Frequency    Min 3X/week      PT Plan Current plan remains appropriate    Co-evaluation              AM-PAC PT "6 Clicks" Mobility   Outcome Measure  Help needed turning from your back to your side while in a flat bed without using bedrails?: A Little Help needed moving from lying on your back to sitting on the side of a flat bed without using bedrails?: A Little Help needed moving to and from a bed to a chair (including a wheelchair)?: A Little Help needed standing up from a chair using your arms (e.g., wheelchair or bedside chair)?: A Lot Help needed to walk in hospital room?: A Little Help needed climbing 3-5 steps with a railing? : Total 6 Click Score: 15    End of Session Equipment Utilized During Treatment: Gait belt Activity Tolerance: Patient tolerated treatment well Patient left: in chair;with call bell/phone within reach;with family/visitor present Nurse Communication: Mobility status PT Visit Diagnosis: Other abnormalities of gait and mobility (R26.89);Unsteadiness on feet (R26.81);History of falling (Z91.81)     Time: 4332-9518 PT Time Calculation (min) (ACUTE ONLY): 44  min  Charges:  $Gait Training: 8-22 mins $Therapeutic Exercise: 8-22 mins $Therapeutic Activity: 8-22 mins                     Mabeline Caras, PT, DPT Acute Rehabilitation Services  Personal: [facility] Office: [place] 11/21/2022, 1:02 PM

## 2022-11-21 NOTE — Progress Notes (Signed)
CARDIAC REHAB PHASE I   Pt requiring x2 assist with chair follow (not available currently). PT/MT seeing later today.   Discussed with pt IS (2048m+ currently), sternal precautions, mobility as tolerated, diet/wt loss, and CRPII. Pt receptive. [place] be discussed due to pts need for hip surgery. He also will need transportation assist.  [phone] KYves DillBS, ACSM-CEP 11/21/2022 10:13 AM

## 2022-11-21 NOTE — Progress Notes (Addendum)
Pt is stable hemodynamically. NSR on the monitor. No acute distress noted overnight. On CPAP machine, SPO2 97-98% at night, respiration is in normal limits. Sternal incision is dry and clean, no drainage. We will continue to monitor.   Humberto Leep, RN

## 2022-11-22 LAB — BASIC METABOLIC PANEL
Anion gap: 9 (ref 5–15)
BUN: 22 mg/dL — ABNORMAL HIGH (ref 6–20)
CO2: 29 mmol/L (ref 22–32)
Calcium: 9.2 mg/dL (ref 8.9–10.3)
Chloride: 90 mmol/L — ABNORMAL LOW (ref 98–111)
Creatinine, Ser: 1.03 mg/dL (ref 0.61–1.24)
GFR, Estimated: >60 mL/min (ref >60)
Glucose, Bld: 148 mg/dL — ABNORMAL HIGH (ref 70–99)
Potassium: 3.3 mmol/L — ABNORMAL LOW (ref 3.5–5.1)
Sodium: 128 mmol/L — ABNORMAL LOW (ref 135–145)

## 2022-11-22 LAB — GLUCOSE, CAPILLARY
Glucose-Capillary: 132 mg/dL — ABNORMAL HIGH (ref 70–99)
Glucose-Capillary: 138 mg/dL — ABNORMAL HIGH (ref 70–99)

## 2022-11-22 LAB — CULTURE, RESPIRATORY W GRAM STAIN

## 2022-11-22 MED ORDER — FUROSEMIDE 40 MG PO TABS
40.0000 mg | ORAL_TABLET | Freq: Every day | ORAL | Status: DC
  Administered 2022-11-22 – 2022-11-23 (×2): 40 mg via ORAL
  Filled 2022-11-22 (×2): qty 1

## 2022-11-22 MED ORDER — POTASSIUM CHLORIDE CRYS ER 20 MEQ PO TBCR
40.0000 meq | EXTENDED_RELEASE_TABLET | Freq: Three times a day (TID) | ORAL | Status: AC
  Administered 2022-11-22 (×3): 40 meq via ORAL
  Filled 2022-11-22 (×3): qty 2

## 2022-11-22 MED ORDER — DM-GUAIFENESIN ER 30-600 MG PO TB12: 1.0000 | ORAL_TABLET | Freq: Two times a day (BID) | ORAL | Status: DC | PRN

## 2022-11-22 NOTE — Care Management Important Message (Signed)
Important Message  Patient Details  Name: DIRK VANAMAN MRN: [id number] Date of Birth: [birth year]   Medicare Important Message Given:  Yes     Shelda Altes 11/22/2022, 11:10 AM

## 2022-11-22 NOTE — Progress Notes (Addendum)
[address]       [place] [address]             [phone]      9 Days Post-Op Procedure(s) (LRB): OFF PUMP CORONARY ARTERY BYPASS GRAFTING (CABG) X ONE BYPASS USING LEFT INTERNAL MAMMARY ARTERY. (N/A) TRANSESOPHAGEAL ECHOCARDIOGRAM (TEE) (N/A) Subjective: Patient states he had 2 bowel movements yesterday, but does feel he is having more production with his cough. He states he does have a migraine this AM.  Objective: Vital signs in last 24 hours: Temp:  [97.7 F (36.5 C)-98.7 F (37.1 C)] 97.7 F (36.5 C) (01/18 0436) Pulse Rate:  [76-91] 76 (01/18 0436) Cardiac Rhythm: Normal sinus rhythm (01/17 2055) Resp:  [17-18] 18 (01/18 0436) BP: (100-121)/(51-71) 112/71 (01/18 0436) SpO2:  [90 %-95 %] 91 % (01/18 0436) Weight:  [165.6 kg] 165.6 kg (01/18 0618)  Hemodynamic parameters for last 24 hours:    Intake/Output from previous day: 01/17 0701 - 01/18 0700 In: -  Out: 1200 [Urine:1200] Intake/Output this shift: No intake/output data recorded.  General appearance: alert, cooperative, and no distress Neurologic: intact Heart: regular rate and rhythm, S1, S2 normal, no murmur, click, rub or gallop Lungs: Slightly diminished bibasilar breath sounds Abdomen: soft, non-tender; bowel sounds normal; no masses,  no organomegaly Extremities: edema trace Wound: Clean and dry with no erythema or sign of infection  Lab Results: Recent Labs    11/20/22 0419 11/21/22 0148  WBC 11.7* 10.1  HGB 15.1 14.1  HCT 42.7 41.5  PLT 224 236   BMET:  Recent Labs    11/21/22 0148 11/22/22 0122  NA 130* 128*  K 3.2* 3.3*  CL 87* 90*  CO2 30 29  GLUCOSE 123* 148*  BUN 28* 22*  CREATININE 1.12 1.03  CALCIUM 9.2 9.2    PT/INR: No results for input(s): "LABPROT", "INR" in the last 72 hours. ABG    Component Value Date/Time   PHART 7.346 (L) 11/15/2022 0501   HCO3 23.7 11/15/2022 0501   TCO2 25 11/15/2022 0501   ACIDBASEDEF 2.0 11/15/2022 0501   O2SAT  90 11/15/2022 0501   CBG (last 3)  Recent Labs    11/21/22 1550 11/21/22 2115 11/22/22 0620  GLUCAP 121* 131* 132*    Assessment/Plan: S/P Procedure(s) (LRB): OFF PUMP CORONARY ARTERY BYPASS GRAFTING (CABG) X ONE BYPASS USING LEFT INTERNAL MAMMARY ARTERY. (N/A) TRANSESOPHAGEAL ECHOCARDIOGRAM (TEE) (N/A)  Neuro: Hx of seizures, on Lamictal, Keppra and Fycompa.  CV: NSR, HR 70s this AM. SBP 112. Lopressor '25mg'$  BID, Plavix '75mg'$  for NSTEMI. Some ST elevation in leads II and AVF, no chest pain. Will monitor.   Pulm: Saturating low 90s on RA. Atelectasis on CXR. Continue IS, flutter valve and ambulation. Productive cough, completed IV Cefepime for possible pneumonia. Will give Mucinex. Hx of OSA, on CPAP last night.  GI: +BM yesterday  Endo: CBG 121/131/132. SSI PRN.  Renal: Cr 1.03, transition to oral Lasix today. Continue potassium supplement.  Hyponatremia: Na 128, likely due to diuresis, will transition to oral Lasix today.  ID: Cefepime course has ended. Afebrile, no leukocytosis  Dispo: Plan for CIR at discharge, pending authorization.    LOS: 12 days    Magdalene River, PA-C 11/22/2022

## 2022-11-22 NOTE — Progress Notes (Addendum)
Inpatient Rehabilitation Admissions Coordinator   I met at bedside with patient . I have received insurance approval for CIR admit but no bed available today. I am hopeful for bed within next 48 hrs. Patient states he has chronic migraines and take percocet at home for management. I would like to see migraine management to improve for better ability to participate in the CIR program. I will follow up tomorrow.  Danne Baxter, RN, MSN Rehab Admissions Coordinator [phone] 11/22/2022 12:07 PM

## 2022-11-22 NOTE — Progress Notes (Signed)
Occupational Therapy Treatment Patient Details Name: James Torres MRN: [id number] DOB: 05/02/1966 Today's Date: 11/22/2022   History of present illness Pt is 57 y.o. male admitted 11/10/22 with chest pain; workup for NSTEMI. S/p CABGx1 on 1/9; self-extubated 1/10. PMH includes CAD, PCI, morbid obesity, benign brain tumor, seizure, L hip arthritis (pt reports need for THA).   OT comments  Pt with migraine, but needing to use bathroom. Ambulated to bathroom and used bariatric BSC over toilet with min assist, total assist for pericare. Pt completed grooming with set up. Educated patient and girlfriend, Shirlean Mylar, in energy conservation and sternal precautions at length, importance of mobilizing. Continues to be appropriate for intensive rehab in AIR. Pt with concerns he won't be able to participate maximally in rehab with migraine pain.    Recommendations for follow up therapy are one component of a multi-disciplinary discharge planning process, led by the attending physician.  Recommendations may be updated based on patient status, additional functional criteria and insurance authorization.    Follow Up Recommendations  Acute inpatient rehab (3hours/day)     Assistance Recommended at Discharge Frequent or constant Supervision/Assistance  Patient can return home with the following  A little help with walking and/or transfers;A lot of help with bathing/dressing/bathroom;Assistance with cooking/housework;Assist for transportation;Help with stairs or ramp for entrance   Equipment Recommendations  Other (comment) (TBD)    Recommendations for Other Services      Precautions / Restrictions Precautions Precautions: Fall;Sternal;Other (comment) (seizures) Precaution Comments: h/o partial petit mal seizures (per chart, pt typically knows when they are about to occur)       Mobility Bed Mobility Overal bed mobility: Needs Assistance Bed Mobility: Rolling, Sidelying to Sit Rolling: Min  assist Sidelying to sit: Min assist     Sit to sidelying: Mod assist General bed mobility comments: cues for technique, assist to raise trunk and for LEs back into bed    Transfers Overall transfer level: Needs assistance Equipment used: Rolling walker (2 wheels) Transfers: Sit to/from Stand Sit to Stand: Min assist, Min guard           General transfer comment: level of assist varies according to height of surface, use of momentum     Balance Overall balance assessment: Needs assistance Sitting-balance support: No upper extremity supported, Feet supported Sitting balance-Leahy Scale: Good       Standing balance-Leahy Scale: Poor Standing balance comment: reliant on UE support                           ADL either performed or assessed with clinical judgement   ADL Overall ADL's : Needs assistance/impaired     Grooming: Set up;Sitting;Wash/dry hands                   Toilet Transfer: Minimal assistance;Ambulation;BSC/3in1;Rolling walker (2 wheels)   Toileting- Clothing Manipulation and Hygiene: Maximal assistance;Sit to/from stand              Extremity/Trunk Assessment              Vision       Perception     Praxis      Cognition Arousal/Alertness: Awake/alert Behavior During Therapy: WFL for tasks assessed/performed Overall Cognitive Status: Within Functional Limits for tasks assessed                                 General  Comments: looking to s/o for answers, questionable memory        Exercises      Shoulder Instructions       General Comments      Pertinent Vitals/ Pain       Pain Assessment Pain Assessment: Faces Faces Pain Scale: Hurts even more Pain Location: head Pain Descriptors / Indicators: Aching Pain Intervention(s): Monitored during session  Home Living                                          Prior Functioning/Environment              Frequency  Min  2X/week        Progress Toward Goals  OT Goals(current goals can now be found in the care plan section)  Progress towards OT goals: Progressing toward goals  Acute Rehab OT Goals OT Goal Formulation: With patient Time For Goal Achievement: 11/29/22 Potential to Achieve Goals: Good  Plan Discharge plan remains appropriate    Co-evaluation                 AM-PAC OT "6 Clicks" Daily Activity     Outcome Measure   Help from another person eating meals?: None Help from another person taking care of personal grooming?: A Little Help from another person toileting, which includes using toliet, bedpan, or urinal?: A Lot Help from another person bathing (including washing, rinsing, drying)?: A Lot Help from another person to put on and taking off regular upper body clothing?: A Little Help from another person to put on and taking off regular lower body clothing?: Total 6 Click Score: 15    End of Session Equipment Utilized During Treatment: Rolling walker (2 wheels);Gait belt  OT Visit Diagnosis: Unsteadiness on feet (R26.81);Other abnormalities of gait and mobility (R26.89);Pain;Muscle weakness (generalized) (M62.81)   Activity Tolerance Patient tolerated treatment well   Patient Left in bed;with call bell/phone within reach;with bed alarm set;with family/visitor present   Nurse Communication Mobility status        Time: 5329-9242 OT Time Calculation (min): 40 min  Charges: OT General Charges $OT Visit: 1 Visit OT Treatments $Self Care/Home Management : 38-52 mins  Cleta Alberts, OTR/L Acute Rehabilitation Services Office: [phone]   Malka So 11/22/2022, 5:13 PM

## 2022-11-22 NOTE — Progress Notes (Signed)
PT Cancellation Note  Patient Details Name: James Torres MRN: [id number] DOB: 02/15/1966   Cancelled Treatment:    Reason Eval/Treat Not Completed: Other (comment).  Refused therapy over migraine, retry at another time.   Ramond Dial 11/22/2022, 11:42 AM  Mee Hives, PT PhD Acute Rehab Dept. Number: [place] [facility] [place]

## 2022-11-22 NOTE — Progress Notes (Signed)
Came to assit with amb but pt on CPAP. Will try again later.

## 2022-11-23 ENCOUNTER — Inpatient Hospital Stay (HOSPITAL_COMMUNITY)
Admission: RE | Admit: 2022-11-23 | Discharge: 2022-11-30 | DRG: 945 | Disposition: A | Discharge status: Home Health | Payer: Medicare PPO | Source: Intra-hospital | Attending: Physical Medicine & Rehabilitation | Admitting: Physical Medicine & Rehabilitation

## 2022-11-23 ENCOUNTER — Encounter (HOSPITAL_COMMUNITY): Payer: Self-pay | Admitting: Physical Medicine & Rehabilitation

## 2022-11-23 ENCOUNTER — Other Ambulatory Visit: Payer: Self-pay

## 2022-11-23 DIAGNOSIS — Z23 Encounter for immunization: Secondary | ICD-10-CM | POA: Diagnosis not present

## 2022-11-23 DIAGNOSIS — I255 Ischemic cardiomyopathy: Secondary | ICD-10-CM | POA: Diagnosis present

## 2022-11-23 DIAGNOSIS — Z951 Presence of aortocoronary bypass graft: Secondary | ICD-10-CM

## 2022-11-23 DIAGNOSIS — Z716 Tobacco abuse counseling: Secondary | ICD-10-CM

## 2022-11-23 DIAGNOSIS — K5900 Constipation, unspecified: Secondary | ICD-10-CM | POA: Diagnosis not present

## 2022-11-23 DIAGNOSIS — Z86011 Personal history of benign neoplasm of the brain: Secondary | ICD-10-CM

## 2022-11-23 DIAGNOSIS — F17210 Nicotine dependence, cigarettes, uncomplicated: Secondary | ICD-10-CM | POA: Diagnosis present

## 2022-11-23 DIAGNOSIS — F419 Anxiety disorder, unspecified: Secondary | ICD-10-CM | POA: Diagnosis present

## 2022-11-23 DIAGNOSIS — E785 Hyperlipidemia, unspecified: Secondary | ICD-10-CM | POA: Diagnosis present

## 2022-11-23 DIAGNOSIS — R7989 Other specified abnormal findings of blood chemistry: Secondary | ICD-10-CM | POA: Diagnosis not present

## 2022-11-23 DIAGNOSIS — M1612 Unilateral primary osteoarthritis, left hip: Secondary | ICD-10-CM | POA: Diagnosis present

## 2022-11-23 DIAGNOSIS — Z6841 Body Mass Index (BMI) 40.0 and over, adult: Secondary | ICD-10-CM

## 2022-11-23 DIAGNOSIS — G40909 Epilepsy, unspecified, not intractable, without status epilepticus: Secondary | ICD-10-CM | POA: Diagnosis present

## 2022-11-23 DIAGNOSIS — R5381 Other malaise: Principal | ICD-10-CM | POA: Diagnosis present

## 2022-11-23 DIAGNOSIS — I213 ST elevation (STEMI) myocardial infarction of unspecified site: Secondary | ICD-10-CM | POA: Diagnosis present

## 2022-11-23 DIAGNOSIS — E871 Hypo-osmolality and hyponatremia: Secondary | ICD-10-CM | POA: Diagnosis not present

## 2022-11-23 DIAGNOSIS — K219 Gastro-esophageal reflux disease without esophagitis: Secondary | ICD-10-CM | POA: Diagnosis present

## 2022-11-23 DIAGNOSIS — I10 Essential (primary) hypertension: Secondary | ICD-10-CM | POA: Diagnosis present

## 2022-11-23 DIAGNOSIS — K5901 Slow transit constipation: Secondary | ICD-10-CM | POA: Diagnosis not present

## 2022-11-23 DIAGNOSIS — Z79899 Other long term (current) drug therapy: Secondary | ICD-10-CM

## 2022-11-23 DIAGNOSIS — E8770 Fluid overload, unspecified: Secondary | ICD-10-CM | POA: Diagnosis not present

## 2022-11-23 DIAGNOSIS — R600 Localized edema: Secondary | ICD-10-CM | POA: Diagnosis not present

## 2022-11-23 DIAGNOSIS — G4700 Insomnia, unspecified: Secondary | ICD-10-CM | POA: Diagnosis present

## 2022-11-23 DIAGNOSIS — K5903 Drug induced constipation: Secondary | ICD-10-CM | POA: Diagnosis not present

## 2022-11-23 DIAGNOSIS — F32A Depression, unspecified: Secondary | ICD-10-CM | POA: Diagnosis present

## 2022-11-23 DIAGNOSIS — Z881 Allergy status to other antibiotic agents status: Secondary | ICD-10-CM

## 2022-11-23 DIAGNOSIS — I5032 Chronic diastolic (congestive) heart failure: Secondary | ICD-10-CM | POA: Diagnosis not present

## 2022-11-23 DIAGNOSIS — I252 Old myocardial infarction: Secondary | ICD-10-CM

## 2022-11-23 DIAGNOSIS — G4733 Obstructive sleep apnea (adult) (pediatric): Secondary | ICD-10-CM | POA: Diagnosis present

## 2022-11-23 DIAGNOSIS — R7303 Prediabetes: Secondary | ICD-10-CM | POA: Diagnosis present

## 2022-11-23 DIAGNOSIS — Z8249 Family history of ischemic heart disease and other diseases of the circulatory system: Secondary | ICD-10-CM

## 2022-11-23 DIAGNOSIS — G43709 Chronic migraine without aura, not intractable, without status migrainosus: Secondary | ICD-10-CM | POA: Diagnosis not present

## 2022-11-23 DIAGNOSIS — R561 Post traumatic seizures: Secondary | ICD-10-CM | POA: Diagnosis not present

## 2022-11-23 DIAGNOSIS — I2510 Atherosclerotic heart disease of native coronary artery without angina pectoris: Secondary | ICD-10-CM | POA: Diagnosis present

## 2022-11-23 DIAGNOSIS — Z955 Presence of coronary angioplasty implant and graft: Secondary | ICD-10-CM

## 2022-11-23 DIAGNOSIS — E878 Other disorders of electrolyte and fluid balance, not elsewhere classified: Secondary | ICD-10-CM | POA: Diagnosis not present

## 2022-11-23 DIAGNOSIS — G43909 Migraine, unspecified, not intractable, without status migrainosus: Secondary | ICD-10-CM | POA: Diagnosis present

## 2022-11-23 DIAGNOSIS — Z7982 Long term (current) use of aspirin: Secondary | ICD-10-CM

## 2022-11-23 LAB — BASIC METABOLIC PANEL
Anion gap: 11 (ref 5–15)
BUN: 17 mg/dL (ref 6–20)
CO2: 28 mmol/L (ref 22–32)
Calcium: 9.2 mg/dL (ref 8.9–10.3)
Chloride: 95 mmol/L — ABNORMAL LOW (ref 98–111)
Creatinine, Ser: 1.03 mg/dL (ref 0.61–1.24)
GFR, Estimated: >60 mL/min (ref >60)
Glucose, Bld: 127 mg/dL — ABNORMAL HIGH (ref 70–99)
Potassium: 4.4 mmol/L (ref 3.5–5.1)
Sodium: 134 mmol/L — ABNORMAL LOW (ref 135–145)

## 2022-11-23 LAB — GLUCOSE, CAPILLARY
Glucose-Capillary: 149 mg/dL — ABNORMAL HIGH (ref 70–99)
Glucose-Capillary: 148 mg/dL — ABNORMAL HIGH (ref 70–99)
Glucose-Capillary: 142 mg/dL — ABNORMAL HIGH (ref 70–99)

## 2022-11-23 MED ORDER — CLONAZEPAM 0.5 MG PO TABS
0.5000 mg | ORAL_TABLET | Freq: Two times a day (BID) | ORAL | Status: DC
  Administered 2022-11-23 – 2022-11-30 (×14): 0.5 mg via ORAL
  Filled 2022-11-23 (×14): qty 1

## 2022-11-23 MED ORDER — SORBITOL 70 % SOLN: 30.0000 mL | Freq: Every day | Status: DC | PRN

## 2022-11-23 MED ORDER — LAMOTRIGINE 100 MG PO TABS
200.0000 mg | ORAL_TABLET | Freq: Two times a day (BID) | ORAL | Status: DC
  Administered 2022-11-23 – 2022-11-30 (×14): 200 mg via ORAL
  Filled 2022-11-23 (×15): qty 2

## 2022-11-23 MED ORDER — METOPROLOL TARTRATE 25 MG PO TABS
25.0000 mg | ORAL_TABLET | Freq: Two times a day (BID) | ORAL | Status: DC
  Administered 2022-11-23 – 2022-11-27 (×7): 25 mg via ORAL
  Filled 2022-11-23 (×8): qty 1

## 2022-11-23 MED ORDER — ACETAMINOPHEN 325 MG PO TABS: 325.0000 mg | ORAL_TABLET | ORAL | Status: DC | PRN

## 2022-11-23 MED ORDER — FAMOTIDINE 20 MG PO TABS: 20.0000 mg | ORAL_TABLET | Freq: Every day | ORAL | Status: DC

## 2022-11-23 MED ORDER — PROCHLORPERAZINE 25 MG RE SUPP: 12.5000 mg | Freq: Four times a day (QID) | RECTAL | Status: DC | PRN

## 2022-11-23 MED ORDER — PANTOPRAZOLE SODIUM 40 MG PO TBEC
80.0000 mg | DELAYED_RELEASE_TABLET | Freq: Every day | ORAL | Status: DC
  Administered 2022-11-24 – 2022-11-30 (×7): 80 mg via ORAL
  Filled 2022-11-23 (×10): qty 2

## 2022-11-23 MED ORDER — PERAMPANEL 8 MG PO TABS
8.0000 mg | ORAL_TABLET | Freq: Every day | ORAL | Status: DC
  Administered 2022-11-23 – 2022-11-29 (×7): 8 mg via ORAL
  Filled 2022-11-23 (×7): qty 1

## 2022-11-23 MED ORDER — ASPIRIN 81 MG PO CHEW
81.0000 mg | CHEWABLE_TABLET | Freq: Every day | ORAL | Status: DC
  Administered 2022-11-24 – 2022-11-30 (×4): 81 mg
  Filled 2022-11-23 (×7): qty 1

## 2022-11-23 MED ORDER — OXYCODONE HCL 5 MG PO TABS
2.5000 mg | ORAL_TABLET | Freq: Four times a day (QID) | ORAL | Status: DC | PRN
  Administered 2022-11-26 – 2022-11-27 (×2): 2.5 mg via ORAL
  Filled 2022-11-23 (×2): qty 1

## 2022-11-23 MED ORDER — MAGNESIUM HYDROXIDE 400 MG/5ML PO SUSP: 30.0000 mL | Freq: Every day | ORAL | Status: DC | PRN

## 2022-11-23 MED ORDER — FLEET ENEMA 7-19 GM/118ML RE ENEM: 1.0000 | ENEMA | Freq: Once | RECTAL | Status: DC | PRN

## 2022-11-23 MED ORDER — ASPIRIN 81 MG PO TBEC
81.0000 mg | DELAYED_RELEASE_TABLET | Freq: Every day | ORAL | Status: DC
  Administered 2022-11-26 – 2022-11-28 (×3): 81 mg via ORAL
  Filled 2022-11-23 (×7): qty 1

## 2022-11-23 MED ORDER — POTASSIUM CHLORIDE CRYS ER 20 MEQ PO TBCR: 20.0000 meq | EXTENDED_RELEASE_TABLET | Freq: Every day | ORAL | Status: DC

## 2022-11-23 MED ORDER — PROCHLORPERAZINE MALEATE 5 MG PO TABS: 5.0000 mg | ORAL_TABLET | Freq: Four times a day (QID) | ORAL | Status: DC | PRN

## 2022-11-23 MED ORDER — ALUM & MAG HYDROXIDE-SIMETH 200-200-20 MG/5ML PO SUSP: 30.0000 mL | ORAL | Status: DC | PRN

## 2022-11-23 MED ORDER — OXYCODONE-ACETAMINOPHEN 5-325 MG PO TABS
1.0000 | ORAL_TABLET | Freq: Four times a day (QID) | ORAL | Status: DC | PRN
  Administered 2022-11-24 – 2022-11-29 (×5): 1 via ORAL
  Filled 2022-11-23 (×5): qty 1

## 2022-11-23 MED ORDER — CITALOPRAM HYDROBROMIDE 20 MG PO TABS
40.0000 mg | ORAL_TABLET | Freq: Every morning | ORAL | Status: DC
  Administered 2022-11-24 – 2022-11-30 (×7): 40 mg via ORAL
  Filled 2022-11-23 (×8): qty 2

## 2022-11-23 MED ORDER — METOPROLOL TARTRATE 25 MG PO TABS: 25.0000 mg | ORAL_TABLET | Freq: Two times a day (BID) | ORAL | Status: DC

## 2022-11-23 MED ORDER — ENOXAPARIN SODIUM 40 MG/0.4ML IJ SOSY: 40.0000 mg | PREFILLED_SYRINGE | INTRAMUSCULAR | Status: DC

## 2022-11-23 MED ORDER — OXYCODONE-ACETAMINOPHEN 5-325 MG PO TABS
1.0000 | ORAL_TABLET | Freq: Four times a day (QID) | ORAL | Status: DC | PRN
  Administered 2022-11-23: 1 via ORAL
  Filled 2022-11-23: qty 1

## 2022-11-23 MED ORDER — ROSUVASTATIN CALCIUM 20 MG PO TABS
40.0000 mg | ORAL_TABLET | Freq: Every day | ORAL | Status: DC
  Administered 2022-11-23 – 2022-11-29 (×7): 40 mg via ORAL
  Filled 2022-11-23 (×7): qty 2

## 2022-11-23 MED ORDER — CLOPIDOGREL BISULFATE 75 MG PO TABS: 75.0000 mg | ORAL_TABLET | Freq: Every day | ORAL | Status: DC

## 2022-11-23 MED ORDER — OXYCODONE-ACETAMINOPHEN 7.5-325 MG PO TABS: 1.0000 | ORAL_TABLET | Freq: Four times a day (QID) | ORAL | Status: DC | PRN

## 2022-11-23 MED ORDER — DOCUSATE SODIUM 100 MG PO CAPS
200.0000 mg | ORAL_CAPSULE | Freq: Every day | ORAL | Status: DC
  Administered 2022-11-24 – 2022-11-30 (×7): 200 mg via ORAL
  Filled 2022-11-23 (×7): qty 2

## 2022-11-23 MED ORDER — OXYCODONE HCL 5 MG PO TABS
2.5000 mg | ORAL_TABLET | Freq: Four times a day (QID) | ORAL | Status: DC | PRN
  Administered 2022-11-23: 2.5 mg via ORAL
  Filled 2022-11-23: qty 1

## 2022-11-23 MED ORDER — FUROSEMIDE 40 MG PO TABS: 40.0000 mg | ORAL_TABLET | Freq: Every day | ORAL | Status: DC

## 2022-11-23 MED ORDER — ENOXAPARIN SODIUM 40 MG/0.4ML IJ SOSY
40.0000 mg | PREFILLED_SYRINGE | INTRAMUSCULAR | Status: DC
  Administered 2022-11-24: 40 mg via SUBCUTANEOUS
  Filled 2022-11-23: qty 0.4

## 2022-11-23 MED ORDER — PROCHLORPERAZINE EDISYLATE 10 MG/2ML IJ SOLN: 5.0000 mg | Freq: Four times a day (QID) | INTRAMUSCULAR | Status: DC | PRN

## 2022-11-23 MED ORDER — GUAIFENESIN-DM 100-10 MG/5ML PO SYRP: 5.0000 mL | ORAL_SOLUTION | Freq: Four times a day (QID) | ORAL | Status: DC | PRN

## 2022-11-23 MED ORDER — LEVETIRACETAM ER 500 MG PO TB24
1000.0000 mg | ORAL_TABLET | Freq: Two times a day (BID) | ORAL | Status: DC
  Administered 2022-11-23 – 2022-11-30 (×14): 1000 mg via ORAL
  Filled 2022-11-23 (×15): qty 2

## 2022-11-23 MED ORDER — INFLUENZA VAC SPLIT QUAD 0.5 ML IM SUSY
0.5000 mL | PREFILLED_SYRINGE | INTRAMUSCULAR | Status: AC
  Administered 2022-11-24: 0.5 mL via INTRAMUSCULAR
  Filled 2022-11-23: qty 0.5

## 2022-11-23 MED ORDER — CLOPIDOGREL BISULFATE 75 MG PO TABS
75.0000 mg | ORAL_TABLET | Freq: Every day | ORAL | Status: DC
  Administered 2022-11-24 – 2022-11-30 (×7): 75 mg via ORAL
  Filled 2022-11-23 (×7): qty 1

## 2022-11-23 MED ORDER — FUROSEMIDE 40 MG PO TABS
40.0000 mg | ORAL_TABLET | Freq: Every day | ORAL | Status: DC
  Administered 2022-11-24 – 2022-11-30 (×7): 40 mg via ORAL
  Filled 2022-11-23 (×7): qty 1

## 2022-11-23 MED ORDER — PNEUMOCOCCAL 20-VAL CONJ VACC 0.5 ML IM SUSY
0.5000 mL | PREFILLED_SYRINGE | INTRAMUSCULAR | Status: AC
  Administered 2022-11-24: 0.5 mL via INTRAMUSCULAR
  Filled 2022-11-23: qty 0.5

## 2022-11-23 NOTE — H&P (Signed)
Physical Medicine and Rehabilitation Admission H&P    CC: Debility secondary to STEMI s/p CABG  HPI: James Torres is 57 year old male who presented to the ED on 11/10/2022 with chest pain. Known history of coronary artery disease with prior NSTEMI and stent placement in 2016. No complaints of SOB or cough. Initial EKG concerning for possible acure ischemia. Elevated troponins. Cardiology consulted and treated for ACS. Medical history significant for seizure disorder on multiple anti-seizure medications. Echo 11/10/22: left ventricular ejection fraction, by estimation, is 45 to 50%. Underwent cardiac catheterization on 1/08 by Dr. Gwenlyn Found which demonstrated a discrete 99% stenosis of the proximal LAD. He felt he would be best served with a LIMA graft to his LAD. CT surgery was consulted. He underwent CABG on 1/09 with Dr. [name]. Transferred to ICU on ventilator  and PCCM consulted. He self-extubated on 1/10. Was able to come of BiPAP on 1/11. Lovenox for DVT prophy. Developed migraine headache of which is has a history. Fitting with CPAP mask and doing well. Tolerating diet. Required HF oxygen use and Lasix continued for diuresis. Developed purulent sputum and chills and started empiric antibiotics 1/14. A1c 6.1%. Semglee and SSI given. Transferred out of ICU on 1/16. H and H stable. The patient requires inpatient physical medicine and rehabilitation evaluations and treatment secondary to dysfunction due to STEMI, CAD s/p CABG.  PMH significant for seizure disorder and is status post gamma knife therapy for a benign epidermoid right cyst in 2019.  Review of Systems  Constitutional:  Negative for chills and fever.  HENT:  Negative for hearing loss and tinnitus.   Eyes:  Positive for photophobia.  Respiratory:  Negative for cough and hemoptysis.   Cardiovascular:  Positive for chest pain.  Gastrointestinal:  Negative for nausea and vomiting.  Genitourinary:  Negative for dysuria.   Musculoskeletal:  Positive for joint pain. Negative for myalgias.  Skin:  Negative for rash.  Neurological:  Negative for dizziness and headaches. Sensory change: headaches. Psychiatric/Behavioral:  Negative for depression and suicidal ideas.    Past Medical History:  Diagnosis Date   Anxiety    Brain tumor ([place])    CAD (coronary artery disease)    a. 03/2015 NSTEMI: LM nl, LAD 50ost, 100p (2.75x38 Synergy DES), 75d, RI nl, LCX minor irregs, OM1 min irregs, RCA 50ost, EF 35-45%.   Depression    GERD (gastroesophageal reflux disease)    Hyperlipidemia    Ischemic cardiomyopathy    a. 03/2015 EF 35-45% by LV gram @ time of NSTEMI;  b. 03/2015 Echo: EF 50-55%, sev HK to DK of dist an, apical, and infap walls.Gr 1 DD.   Kidney stone    Metabolic syndrome 5/37/4827   Migraines    Morbid obesity ([place])    Pre-diabetes    Seizures ([place])    a. Has auras and metalic taste in mouth.  Last one 06/2014- last a few seconds, has one every couple weeks, Dr Mare Loan is neurologist and is aware.    Sleep apnea 2009   a. On CPAP.   Tobacco abuse    Past Surgical History:  Procedure Laterality Date   BRAIN SURGERY  2004   Crainotomy for tumor   CARDIAC CATHETERIZATION N/A 03/21/2015   Procedure: Left Heart Cath and Coronary Angiography;  Surgeon: Sherren Mocha, MD;  Location: [facility];  Service: Cardiovascular;  Laterality: N/A;   CORONARY ARTERY BYPASS GRAFT N/A 11/13/2022   Procedure: OFF PUMP CORONARY ARTERY BYPASS GRAFTING (  CABG) X ONE BYPASS USING LEFT INTERNAL MAMMARY ARTERY.;  Surgeon: Lajuana Matte, MD;  Location: [facility];  Service: Open Heart Surgery;  Laterality: N/A;   KNEE ARTHROSCOPY Right 2005 approx   cartliage   LEFT HEART CATH AND CORONARY ANGIOGRAPHY N/A 11/12/2022   Procedure: LEFT HEART CATH AND CORONARY ANGIOGRAPHY;  Surgeon: Lorretta Harp, MD;  Location: [facility];  Service: Cardiovascular;  Laterality: N/A;   TEE WITHOUT CARDIOVERSION N/A 11/13/2022    Procedure: TRANSESOPHAGEAL ECHOCARDIOGRAM (TEE);  Surgeon: Lajuana Matte, MD;  Location: [facility];  Service: Open Heart Surgery;  Laterality: N/A;   ULNAR NERVE TRANSPOSITION Left 06/25/2014   Procedure: LEFT ULNAR NEUROPLASTY AT ELBOW;  Surgeon: Jolyn Nap, MD;  Location: [facility];  Service: Orthopedics;  Laterality: Left;   WISDOM TOOTH EXTRACTION     Family History  Problem Relation Age of Onset   Healthy Mother    Heart disease Father    Stroke Father    Healthy Sister    Healthy Sister    Heart attack Neg Hx    Social History:  reports that he has been smoking cigarettes. He has a 10.00 pack-year smoking history. He has never used smokeless tobacco. He reports that he does not drink alcohol and does not use drugs. Allergies:  Allergies  Allergen Reactions   Zithromax [Azithromycin] Hives   Medications Prior to Admission  Medication Sig Dispense Refill   acetaminophen (TYLENOL) 500 MG tablet Take 1,000 mg by mouth every 6 (six) hours as needed for mild pain.     aspirin EC 81 MG EC tablet Take 1 tablet (81 mg total) by mouth daily.     CALCIUM CARBONATE-VITAMIN D PO Take 1 tablet by mouth daily with breakfast.     carvedilol (COREG) 6.25 MG tablet Take 1 tablet by mouth in the morning and at bedtime. (Patient taking differently: Take 6.25 mg by mouth 2 (two) times daily with a meal.) 180 tablet 3   Cholecalciferol (VITAMIN D) 125 MCG (5000 UT) CAPS Take 5,000 Units by mouth daily.     citalopram (CELEXA) 40 MG tablet Take 40 mg by mouth every morning.     clonazePAM (KLONOPIN) 0.5 MG tablet Take 0.25-0.5 mg by mouth 2 (two) times daily.     Fremanezumab-vfrm (AJOVY) 225 MG/1.5ML SOAJ Inject 1 Dose into the skin every 30 (thirty) days.     ibuprofen (ADVIL) 600 MG tablet Take 600 mg by mouth 2 (two) times daily as needed for mild pain.     lamoTRIgine (LAMICTAL) 100 MG tablet Take 200 mg by mouth in the morning and at bedtime.     levETIRAcetam (KEPPRA XR) 500 MG 24 hr  tablet Take 1,000 mg by mouth 2 (two) times daily.     lisinopril (ZESTRIL) 2.5 MG tablet Take 1 tablet (2.5 mg total) by mouth daily. 90 tablet 2   Melatonin 5 MG CHEW Chew 15-20 mg by mouth at bedtime.     nitroGLYCERIN (NITROSTAT) 0.4 MG SL tablet Place 1 tablet (0.4 mg total) under the tongue every 5 (five) minutes as needed for chest pain (x 3 pills daily). 25 tablet 6   oxyCODONE-acetaminophen (ROXICET) 5-325 MG per tablet Take 1-2 tablets by mouth every 4 (four) hours as needed for severe pain. 40 tablet 0   pantoprazole (PROTONIX) 40 MG tablet Take 2 tablets (80 mg total) by mouth daily. 180 tablet 3   perampanel (FYCOMPA) 8 MG tablet Take 1 tablet by mouth  daily.     rosuvastatin (CRESTOR) 40 MG tablet Take 1 tablet (40 mg total) by mouth daily. 90 tablet 3   Ubrogepant (UBRELVY) 100 MG TABS Take 1-2 tablets by mouth daily as needed (HEADACHE).     [DISCONTINUED] famotidine (PEPCID) 20 MG tablet TAKE 1 TABLET BY MOUTH EVERY DAY (Patient taking differently: Take 20 mg by mouth at bedtime.) 90 tablet 2      Home: Home Living Family/patient expects to be discharged to:: Private residence Living Arrangements: Spouse/significant other Available Help at Discharge: Family, Available 24 hours/day Type of Home: House Home Access: Stairs to enter [employer] of Steps: 3 Entrance Stairs-Rails: Left (wall on R) Home Layout: One level Bathroom Shower/Tub: Chiropodist: Handicapped height Home Equipment: None   Functional History: Prior Function Prior Level of Function : Needs assist Mobility Comments: walks without AD, fall 3 weeks ago, near falls ADLs Comments: assisted for LB bathing and dressing, s/o does IADLs. Pt mostly sponge bathes at sink  Functional Status:  Mobility: Bed Mobility Overal bed mobility: Needs Assistance Bed Mobility: Rolling, Sidelying to Sit Rolling: Min assist Sidelying to sit: Min assist Supine to sit: Supervision Sit to  sidelying: Mod assist General bed mobility comments: cues for technique, assist to raise trunk and for LEs back into bed Transfers Overall transfer level: Needs assistance Equipment used: Rolling walker (2 wheels) Transfers: Sit to/from Stand Sit to Stand: Min assist, Min guard General transfer comment: level of assist varies according to height of surface, use of momentum Ambulation/Gait Ambulation/Gait assistance: Min guard, Min assist Gait Distance (Feet): 30 Feet (+ 90' + 104') Assistive device: Rolling walker (2 wheels) Gait Pattern/deviations: Step-through pattern, Decreased stride length, Trunk flexed, Decreased weight shift to left General Gait Details: pt expressing potential for L knee instability, tennis shoes donned. slow, guarded gait with RW and min guard for balance; pt requiring 3x seated rest break secondary to fatigue/SOB, then progressive LLE instability with minA for stability Gait velocity: Decreased Gait velocity interpretation: <1.31 ft/sec, indicative of household ambulator    ADL: ADL Overall ADL's : Needs assistance/impaired Eating/Feeding: Set up, Sitting Grooming: Set up, Sitting, Wash/dry hands Upper Body Bathing: Moderate assistance, Sitting Lower Body Bathing: Total assistance, Sit to/from stand, +2 for safety/equipment Upper Body Dressing : Sitting, Maximal assistance Lower Body Dressing: Total assistance, +2 for safety/equipment, Sit to/from stand Toilet Transfer: Minimal assistance, Ambulation, BSC/3in1, Rolling walker (2 wheels) Toileting- Clothing Manipulation and Hygiene: Maximal assistance, Sit to/from stand Toileting - Clothing Manipulation Details (indicate cue type and reason): peri care in standing Functional mobility during ADLs: Minimal assistance, Rolling walker (2 wheels) General ADL Comments: cues for sternal cues. unsteady upon standing  Cognition: Cognition Overall Cognitive Status: Within Functional Limits for tasks  assessed Orientation Level: Oriented X4 Cognition Arousal/Alertness: Awake/alert Behavior During Therapy: WFL for tasks assessed/performed Overall Cognitive Status: Within Functional Limits for tasks assessed General Comments: looking to s/o for answers, questionable memory  Physical Exam: Blood pressure 114/66, pulse 68, temperature 98.8 F (37.1 C), temperature source Oral, resp. rate 18, height 6' (1.829 m), weight (!) 165.3 kg, SpO2 93 %. Physical Exam Constitutional:      General: He is not in acute distress.    Appearance: He is obese.  HENT:     Head: Normocephalic.     Right Ear: External ear normal.     Left Ear: External ear normal.     Nose: Nose normal.     Mouth/Throat:     Mouth:  Mucous membranes are moist.  Eyes:     Extraocular Movements: Extraocular movements intact.     Pupils: Pupils are equal, round, and reactive to light.  Cardiovascular:     Rate and Rhythm: Normal rate and regular rhythm.     Heart sounds: No murmur heard.    No gallop.  Pulmonary:     Effort: Pulmonary effort is normal. No respiratory distress.     Breath sounds: Normal breath sounds. No wheezing.  Abdominal:     General: Bowel sounds are normal. There is no distension.     Tenderness: There is no abdominal tenderness.  Musculoskeletal:        General: No swelling. Normal range of motion.     Cervical back: Normal range of motion.     Comments: Trace LE edema  Skin:    General: Skin is warm and dry.     Comments: Chest incision CDI. Pressure mark on bridge of nose from cpap mask  Neurological:     Mental Status: He is alert.     Comments: Alert and oriented x 3. Normal insight and awareness. Intact Memory. Normal language and speech. Cranial nerve exam unremarkable. UE motor 4+ to 5/5. LE 4/5 prox to 5/5 distal. Sensory exam normal for light touch and pain in all 4 limbs. No limb ataxia or cerebellar signs. No abnormal tone appreciated.    Psychiatric:        Mood and Affect: Mood  normal.        Behavior: Behavior normal.     Results for orders placed or performed during the hospital encounter of 11/10/22 (from the past 48 hour(s))  Glucose, capillary     Status: Abnormal   Collection Time: 11/21/22  3:50 PM  Result Value Ref Range   Glucose-Capillary 121 (H) 70 - 99 mg/dL    Comment: Glucose reference range applies only to samples taken after fasting for at least 8 hours.   Comment 1 Notify RN   Glucose, capillary     Status: Abnormal   Collection Time: 11/21/22  9:15 PM  Result Value Ref Range   Glucose-Capillary 131 (H) 70 - 99 mg/dL    Comment: Glucose reference range applies only to samples taken after fasting for at least 8 hours.   Comment 1 Notify RN    Comment 2 Document in Chart   Basic metabolic panel     Status: Abnormal   Collection Time: 11/22/22  1:22 AM  Result Value Ref Range   Sodium 128 (L) 135 - 145 mmol/L   Potassium 3.3 (L) 3.5 - 5.1 mmol/L   Chloride 90 (L) 98 - 111 mmol/L   CO2 29 22 - 32 mmol/L   Glucose, Bld 148 (H) 70 - 99 mg/dL    Comment: Glucose reference range applies only to samples taken after fasting for at least 8 hours.   BUN 22 (H) 6 - 20 mg/dL   Creatinine, Ser 1.03 0.61 - 1.24 mg/dL   Calcium 9.2 8.9 - 10.3 mg/dL   GFR, Estimated >60 >60 mL/min    Comment: (NOTE) Calculated using the CKD-EPI Creatinine Equation (2021)    Anion gap 9 5 - 15    Comment: Performed at [facility] [address] [place], [place] [address]  Glucose, capillary     Status: Abnormal   Collection Time: 11/22/22  6:20 AM  Result Value Ref Range   Glucose-Capillary 132 (H) 70 - 99 mg/dL    Comment: Glucose  reference range applies only to samples taken after fasting for at least 8 hours.   Comment 1 Notify RN    Comment 2 Document in Chart   Glucose, capillary     Status: Abnormal   Collection Time: 11/22/22  9:24 PM  Result Value Ref Range   Glucose-Capillary 138 (H) 70 - 99 mg/dL    Comment: Glucose reference range applies  only to samples taken after fasting for at least 8 hours.   Comment 1 Notify RN    Comment 2 Document in Chart   Basic metabolic panel     Status: Abnormal   Collection Time: 11/23/22  2:21 AM  Result Value Ref Range   Sodium 134 (L) 135 - 145 mmol/L   Potassium 4.4 3.5 - 5.1 mmol/L   Chloride 95 (L) 98 - 111 mmol/L   CO2 28 22 - 32 mmol/L   Glucose, Bld 127 (H) 70 - 99 mg/dL    Comment: Glucose reference range applies only to samples taken after fasting for at least 8 hours.   BUN 17 6 - 20 mg/dL   Creatinine, Ser 1.03 0.61 - 1.24 mg/dL   Calcium 9.2 8.9 - 10.3 mg/dL   GFR, Estimated >60 >60 mL/min    Comment: (NOTE) Calculated using the CKD-EPI Creatinine Equation (2021)    Anion gap 11 5 - 15    Comment: Performed at [facility] [address] [place], [place] [address]  Glucose, capillary     Status: Abnormal   Collection Time: 11/23/22  6:14 AM  Result Value Ref Range   Glucose-Capillary 149 (H) 70 - 99 mg/dL    Comment: Glucose reference range applies only to samples taken after fasting for at least 8 hours.   Comment 1 Notify RN    Comment 2 Document in Chart    No results found.    Blood pressure 114/66, pulse 68, temperature 98.8 F (37.1 C), temperature source Oral, resp. rate 18, height 6' (1.829 m), weight (!) 165.3 kg, SpO2 93 %.  Medical Problem List and Plan: 1. Functional deficits secondary to debility after MI/CABG and associated medical complications  -patient may  shower  -ELOS/Goals: 10-12 days, supervision goals with PT, OT 2.  Antithrombotics: -DVT/anticoagulation:  Pharmaceutical: Lovenox  -antiplatelet therapy: Plavix and aspirin 81 mg daily  3. Pain Management: Tylenol, oxycodone  4. Mood/Behavior/Sleep: LCSW to evaluate and provide emotional support  -anxiety disorder: Continue Celexa 40 mg and Klonopin 0.5 mg BID  -antipsychotic agents: n/a  5. Neuropsych/cognition: This patient is capable of making decisions on his own  behalf.  6. Skin/Wound Care: Routine skin care checks   7. Fluids/Electrolytes/Nutrition: routine Is and Os and follow-up chemistries  9: GERD: continue Protonix 80 mg   10: Migraine headache: uses ? Roxicet 5/325, Roselyn Meier as needed (home meds)  -Ajovy monthly  11: CAD/ICM/STEMI: s/p CABG times one vessel 1/09, POD #10  -continue sternal precautions  12: OSA on CPAP  -continue HS with oxygen 13: Volume overload: continue Lasix 40 mg daily; follow-up BMP  -daily weights  -Lower extremity edema improved 14: Pneumonia, possible: finished antibiotics, no fever or cough  -O2 via [place] prn, [facility]  15: Class 3 obesity: BMI = 49.42  16: Seizure disorder:  -Klonopin 0.25-0.5 mg BID  -Lamictal 200 mg q AM and q HS  -Keppra 100 mg BID  -Fycompa 8 mg daily  17: Hyperlipidemia: Crestor 40 mg daily  18: Tobacco use: cessation counseling  19: Hypertension: monitor  BP TID and prn  -continue metoprolol 25 mg BID  -home lisinopril not restarted   20: Left hip pain/DJD: surgery pending weight loss; follow-up outpatient  21: Pre-diabetes: no meds     Barbie Banner, PA-C 11/23/2022

## 2022-11-23 NOTE — Discharge Summary (Incomplete)
Physician Discharge Summary  Patient ID: James Torres MRN: [id number] DOB/AGE: [birth year] 59 y.o.  Admit date: 11/23/2022 Discharge date: 11/30/2022  Discharge Diagnoses:  Principal Problem:   Debility Active Problems:   Elevated LFTs Active problems: Anxiety disorder GERD Migraine headache Obstructive sleep apnea Volume overload Class III obesity Seizure disorder Hyperlipidemia Tobacco use Hypertension Left hip pain Prediabetes Constipation   Discharged Condition: stable  Significant Diagnostic Studies:  Labs:  Basic Metabolic Panel: Recent Labs  Lab 11/17/22 0441 11/18/22 0652 11/19/22 0048 11/20/22 0419 11/21/22 0148 11/22/22 0122 11/23/22 0221  NA 134* 134* 133* 130* 130* 128* 134*  K 3.5 3.5 3.4* 3.1* 3.2* 3.3* 4.4  CL 89* 91* 87* 85* 87* 90* 95*  CO2 29 34* '29 30 30 29 28  '$ GLUCOSE 117* 127* 119* 139* 123* 148* 127*  BUN '13 11 15 '$ 27* 28* 22* 17  CREATININE 1.02 1.02 1.10 1.14 1.12 1.03 1.03  CALCIUM 9.0 9.0 9.3 9.6 9.2 9.2 9.2    CBC: Recent Labs  Lab 11/19/22 0048 11/20/22 0419 11/21/22 0148  WBC 11.2* 11.7* 10.1  NEUTROABS 7.7  --   --   HGB 14.9 15.1 14.1  HCT 42.2 42.7 41.5  MCV 90.4 88.8 90.0  PLT 195 224 236    CBG: Recent Labs  Lab 11/23/22 2058 11/24/22 0553 11/24/22 1159 11/24/22 1628 11/24/22 2102  GLUCAP 142* 135* 121* 95 132*    Brief HPI:   James Torres is a 57 y.o. male  with history of seizure d/o, frontal epidermoid cyst s/p gamma knife, NSTEMI s/p stent 2016; who presented to the ED on 11/10/2022 with chest pain.  Initial EKG concerning for possible acure ischemia w/elevated troponin's and was treated for ACS.  2D Echo 11/10/22: left ventricular ejection fraction, by estimation, is 45 to 50%. Underwent cardiac catheterization on 1/08 by Dr. Gwenlyn Found which demonstrated a discrete 99% stenosis of the proximal LAD. He felt he would be best served with a LIMA graft to his LAD.  He underwent CABG on 1/09 with Dr. [name]  and self-extubated on 1/10. Hospital course significant for HA due to  migraines, fluid overload requiring IV diuresis as well as hypoxia requiring BIPAP. He was weaned off HF oxygen. He developed purulent sputum with chills and ws treated with empiric antibiotics for PNA. Therapy was working with patient who was noted to be debilitated. Chart reviewed Rehab admissions CM and psychiatrist. CIR recommended due to functional decline.    Hospital Course: James Torres was admitted to rehab 11/23/2022 for inpatient therapies to consist of PT, ST and OT at least three hours five days a week. Past admission physiatrist, therapy team and rehab RN have worked together to provide customized collaborative inpatient rehab. His blood pressures were monitored on TID basis and metoprolol tapered to 12.5 mg bid due to downward trend in BP.   Peripheral edema has improved and CHF compensated on current regimen.  His home lisinopril was not restarted due to lower BP. [name]  Follow up labs showed hyponatremia to be stable and pre-renal azotemia has resolved. CBC showed H/H to be stable and reactive leucocytosis has resolved. He has been  been compliant with his CPAP use during rehab stay. Melatonin was added prn insomnia.     Respiratory status has been stable He continued to report daily migraines and percocet 7.5/325 has been used daily on average. He was tapered to 5 mg prn at discharge and advised to follow up with his neurology/pain management for  further input and refills. OIC has been managed with use of miralax bid. He  has been educated on need to continue sternal precautions till cleared by Psychologist, sport and exercise. He did report brief seizure episode on 01/25 and no recurrent episodes reported. He is to follow up with neurology after discharge. He  has made good gains during his stay and is modified independent at discharge.     Rehab course: During patient's stay in rehab weekly team conferences were held to monitor patient's  progress, set goals and discuss barriers to discharge. At admission, patient required assist with basic self-care skills and min assist for basic mobility. He has had improvement in activity tolerance, balance, postural control as well as ability to compensate for deficits.  He is able to complete ADL tasks at modified independent level. He is independent for transfers and to ambulate 150' with rollater. Family education has been completed.     Disposition: Home   Diet: heart healthy  Special Instructions: No driving, strenuous activity or return to work till cleared by MD Continue sternal precautions.  3. Recommend repeat BMET in 1-2 weeks to monitor Na and renal status.   .  Allergies as of 11/30/2022       Reactions   Zithromax [azithromycin] Hives   Aspartame Other (See Comments)   Triggers migraines         Medication List     STOP taking these medications    CALCIUM CARBONATE-VITAMIN D PO   potassium chloride SA 20 MEQ tablet Commonly known as: KLOR-CON M   Vitamin D 125 MCG (5000 UT) Caps       TAKE these medications    acetaminophen 325 MG tablet Commonly known as: TYLENOL Take 1-2 tablets (325-650 mg total) by mouth every 4 (four) hours as needed for mild pain. What changed:  medication strength how much to take when to take this   Ajovy 225 MG/1.5ML Soaj Generic drug: Fremanezumab-vfrm Inject 1 Dose into the skin every 30 (thirty) days.   Aspirin Low Dose 81 MG tablet Generic drug: aspirin EC Take 1 tablet (81 mg total) by mouth daily.   citalopram 40 MG tablet Commonly known as: CELEXA Take 40 mg by mouth every morning.   clonazePAM 0.5 MG tablet Commonly known as: KLONOPIN Take 0.25-0.5 mg by mouth 2 (two) times daily.   clopidogrel 75 MG tablet Commonly known as: PLAVIX Take 1 tablet (75 mg total) by mouth daily.   docusate sodium 100 MG capsule Commonly known as: COLACE Take 2 capsules (200 mg total) by mouth daily.   famotidine 20  MG tablet Commonly known as: PEPCID Take 1 tablet (20 mg total) by mouth at bedtime.   furosemide 40 MG tablet Commonly known as: LASIX Take 1 tablet (40 mg total) by mouth daily.   Keppra XR 500 MG 24 hr tablet Generic drug: levETIRAcetam Take 1,000 mg by mouth 2 (two) times daily.   lamoTRIgine 100 MG tablet Commonly known as: LAMICTAL Take 200 mg by mouth in the morning and at bedtime.   Melatonin 5 MG Chew Chew 15-20 mg by mouth at bedtime.   metoprolol tartrate 25 MG tablet Commonly known as: LOPRESSOR Take 0.5 tablets (12.5 mg total) by mouth 2 (two) times daily. What changed: how much to take   oxyCODONE-acetaminophen 5-325 MG tablet--Rx# 21 pills.  Commonly known as: PERCOCET/ROXICET Take 1 tablet by mouth every 8 (eight) hours as needed for moderate pain (migraine headache). What changed:  how much to take when  to take this reasons to take this   pantoprazole 40 MG tablet Commonly known as: PROTONIX Take 2 tablets (80 mg total) by mouth daily.   perampanel 8 MG tablet Commonly known as: FYCOMPA Take 1 tablet by mouth daily.   polyethylene glycol powder 17 GM/SCOOP powder Commonly known as: GLYCOLAX/MIRALAX Dissolve 1 capful (17 g) in water and take by mouth 2 (two) times daily   rosuvastatin 40 MG tablet Commonly known as: CRESTOR Take 1 tablet (40 mg total) by mouth daily.        Follow-up Information     Lawerance Cruel, MD Follow up.   Specialty: Family Medicine Why: Call the office in 1-2 days to make arrangments for hospital follow-up appointment. Contact information: [address] [place] [address] [phone]         Lajuana Matte, MD Follow up.   Specialty: Cardiothoracic Surgery Why: **telephone office visit on Friday, 1/26 at 2:50pm ** If this visit is not completed, please call the office in 1-2 days to make arrangments for hospital follow-up appointment. Contact information: [address]  [place] [address] [phone]         Jennye Boroughs, MD Follow up.   Specialty: Physical Medicine and Rehabilitation Why: As needed Contact information: [address] [place] [address] [phone]         Loel Dubonnet, NP. Go to.   Specialty: Cardiology Contact information: [place] [address] [phone]                 Signed: Bary Leriche 11/30/2022, 11:56 AM

## 2022-11-23 NOTE — H&P (Addendum)
Physical Medicine and Rehabilitation Admission H&P     CC: Debility secondary to STEMI s/p CABG   HPI: James Torres is 57 year old male who presented to the ED on 11/10/2022 with chest pain. Known history of coronary artery disease with prior NSTEMI and stent placement in 2016. No complaints of SOB or cough. Initial EKG concerning for possible acure ischemia. Elevated troponins. Cardiology consulted and treated for ACS. Medical history significant for seizure disorder on multiple anti-seizure medications. Echo 11/10/22: left ventricular ejection fraction, by estimation, is 45 to 50%. Underwent cardiac catheterization on 1/08 by Dr. Gwenlyn Found which demonstrated a discrete 99% stenosis of the proximal LAD. He felt he would be best served with a LIMA graft to his LAD. CT surgery was consulted. He underwent CABG on 1/09 with Dr. [name]. Transferred to ICU on ventilator  and PCCM consulted. He self-extubated on 1/10. Was able to come of BiPAP on 1/11. Lovenox for DVT prophy. Developed migraine headache of which is has a history. Fitting with CPAP mask and doing well. Tolerating diet. Required HF oxygen use and Lasix continued for diuresis. Developed purulent sputum and chills and started empiric antibiotics 1/14. A1c 6.1%. Semglee and SSI given. Transferred out of ICU on 1/16. H and H stable. The patient requires inpatient physical medicine and rehabilitation evaluations and treatment secondary to dysfunction due to STEMI, CAD s/p CABG.   PMH significant for seizure disorder and is status post gamma knife therapy for a benign epidermoid right cyst in 2019.   Review of Systems  Constitutional:  Negative for chills and fever.  HENT:  Negative for hearing loss and tinnitus.   Eyes:  Positive for photophobia.  Respiratory:  Negative for cough and hemoptysis.   Cardiovascular:  Positive for chest pain.  Gastrointestinal:  Negative for nausea and vomiting.  Genitourinary:  Negative for dysuria.  Musculoskeletal:   Positive for joint pain. Negative for myalgias.  Skin:  Negative for rash.  Neurological:  Negative for dizziness and headaches. Sensory change: headaches. Psychiatric/Behavioral:  Negative for depression and suicidal ideas.         Past Medical History:  Diagnosis Date   Anxiety     Brain tumor ([place])     CAD (coronary artery disease)      a. 03/2015 NSTEMI: LM nl, LAD 50ost, 100p (2.75x38 Synergy DES), 75d, RI nl, LCX minor irregs, OM1 min irregs, RCA 50ost, EF 35-45%.   Depression     GERD (gastroesophageal reflux disease)     Hyperlipidemia     Ischemic cardiomyopathy      a. 03/2015 EF 35-45% by LV gram @ time of NSTEMI;  b. 03/2015 Echo: EF 50-55%, sev HK to DK of dist an, apical, and infap walls.Gr 1 DD.   Kidney stone     Metabolic syndrome 3/66/2947   Migraines     Morbid obesity ([place])     Pre-diabetes     Seizures ([place])      a. Has auras and metalic taste in mouth.  Last one 06/2014- last a few seconds, has one every couple weeks, Dr Mare Loan is neurologist and is aware.    Sleep apnea 2009    a. On CPAP.   Tobacco abuse           Past Surgical History:  Procedure Laterality Date   BRAIN SURGERY   2004    Crainotomy for tumor   CARDIAC CATHETERIZATION N/A 03/21/2015    Procedure: Left Heart Cath and Coronary Angiography;  Surgeon: Sherren Mocha, MD;  Location: [facility];  Service: Cardiovascular;  Laterality: N/A;   CORONARY ARTERY BYPASS GRAFT N/A 11/13/2022    Procedure: OFF PUMP CORONARY ARTERY BYPASS GRAFTING (CABG) X ONE BYPASS USING LEFT INTERNAL MAMMARY ARTERY.;  Surgeon: Lajuana Matte, MD;  Location: [facility];  Service: Open Heart Surgery;  Laterality: N/A;   KNEE ARTHROSCOPY Right 2005 approx    cartliage   LEFT HEART CATH AND CORONARY ANGIOGRAPHY N/A 11/12/2022    Procedure: LEFT HEART CATH AND CORONARY ANGIOGRAPHY;  Surgeon: Lorretta Harp, MD;  Location: [facility];  Service: Cardiovascular;  Laterality: N/A;   TEE WITHOUT  CARDIOVERSION N/A 11/13/2022    Procedure: TRANSESOPHAGEAL ECHOCARDIOGRAM (TEE);  Surgeon: Lajuana Matte, MD;  Location: [facility];  Service: Open Heart Surgery;  Laterality: N/A;   ULNAR NERVE TRANSPOSITION Left 06/25/2014    Procedure: LEFT ULNAR NEUROPLASTY AT ELBOW;  Surgeon: Jolyn Nap, MD;  Location: [facility];  Service: Orthopedics;  Laterality: Left;   WISDOM TOOTH EXTRACTION             Family History  Problem Relation Age of Onset   Healthy Mother     Heart disease Father     Stroke Father     Healthy Sister     Healthy Sister     Heart attack Neg Hx      Social History:  reports that he has been smoking cigarettes. He has a 10.00 pack-year smoking history. He has never used smokeless tobacco. He reports that he does not drink alcohol and does not use drugs. Allergies:      Allergies  Allergen Reactions   Zithromax [Azithromycin] Hives          Medications Prior to Admission  Medication Sig Dispense Refill   acetaminophen (TYLENOL) 500 MG tablet Take 1,000 mg by mouth every 6 (six) hours as needed for mild pain.       aspirin EC 81 MG EC tablet Take 1 tablet (81 mg total) by mouth daily.       CALCIUM CARBONATE-VITAMIN D PO Take 1 tablet by mouth daily with breakfast.       carvedilol (COREG) 6.25 MG tablet Take 1 tablet by mouth in the morning and at bedtime. (Patient taking differently: Take 6.25 mg by mouth 2 (two) times daily with a meal.) 180 tablet 3   Cholecalciferol (VITAMIN D) 125 MCG (5000 UT) CAPS Take 5,000 Units by mouth daily.       citalopram (CELEXA) 40 MG tablet Take 40 mg by mouth every morning.       clonazePAM (KLONOPIN) 0.5 MG tablet Take 0.25-0.5 mg by mouth 2 (two) times daily.       Fremanezumab-vfrm (AJOVY) 225 MG/1.5ML SOAJ Inject 1 Dose into the skin every 30 (thirty) days.       ibuprofen (ADVIL) 600 MG tablet Take 600 mg by mouth 2 (two) times daily as needed for mild pain.       lamoTRIgine (LAMICTAL) 100 MG tablet Take 200 mg by mouth in  the morning and at bedtime.       levETIRAcetam (KEPPRA XR) 500 MG 24 hr tablet Take 1,000 mg by mouth 2 (two) times daily.       lisinopril (ZESTRIL) 2.5 MG tablet Take 1 tablet (2.5 mg total) by mouth daily. 90 tablet 2   Melatonin 5 MG CHEW Chew 15-20 mg by mouth at bedtime.       nitroGLYCERIN (  NITROSTAT) 0.4 MG SL tablet Place 1 tablet (0.4 mg total) under the tongue every 5 (five) minutes as needed for chest pain (x 3 pills daily). 25 tablet 6   oxyCODONE-acetaminophen (ROXICET) 5-325 MG per tablet Take 1-2 tablets by mouth every 4 (four) hours as needed for severe pain. 40 tablet 0   pantoprazole (PROTONIX) 40 MG tablet Take 2 tablets (80 mg total) by mouth daily. 180 tablet 3   perampanel (FYCOMPA) 8 MG tablet Take 1 tablet by mouth daily.       rosuvastatin (CRESTOR) 40 MG tablet Take 1 tablet (40 mg total) by mouth daily. 90 tablet 3   Ubrogepant (UBRELVY) 100 MG TABS Take 1-2 tablets by mouth daily as needed (HEADACHE).       [DISCONTINUED] famotidine (PEPCID) 20 MG tablet TAKE 1 TABLET BY MOUTH EVERY DAY (Patient taking differently: Take 20 mg by mouth at bedtime.) 90 tablet 2          Home: Home Living Family/patient expects to be discharged to:: Private residence Living Arrangements: Spouse/significant other Available Help at Discharge: Family, Available 24 hours/day Type of Home: House Home Access: Stairs to enter [employer] of Steps: 3 Entrance Stairs-Rails: Left (wall on R) Home Layout: One level Bathroom Shower/Tub: Chiropodist: Handicapped height Home Equipment: None   Functional History: Prior Function Prior Level of Function : Needs assist Mobility Comments: walks without AD, fall 3 weeks ago, near falls ADLs Comments: assisted for LB bathing and dressing, s/o does IADLs. Pt mostly sponge bathes at sink   Functional Status:  Mobility: Bed Mobility Overal bed mobility: Needs Assistance Bed Mobility: Rolling, Sidelying to  Sit Rolling: Min assist Sidelying to sit: Min assist Supine to sit: Supervision Sit to sidelying: Mod assist General bed mobility comments: cues for technique, assist to raise trunk and for LEs back into bed Transfers Overall transfer level: Needs assistance Equipment used: Rolling walker (2 wheels) Transfers: Sit to/from Stand Sit to Stand: Min assist, Min guard General transfer comment: level of assist varies according to height of surface, use of momentum Ambulation/Gait Ambulation/Gait assistance: Min guard, Min assist Gait Distance (Feet): 30 Feet (+ 90' + 104') Assistive device: Rolling walker (2 wheels) Gait Pattern/deviations: Step-through pattern, Decreased stride length, Trunk flexed, Decreased weight shift to left General Gait Details: pt expressing potential for L knee instability, tennis shoes donned. slow, guarded gait with RW and min guard for balance; pt requiring 3x seated rest break secondary to fatigue/SOB, then progressive LLE instability with minA for stability Gait velocity: Decreased Gait velocity interpretation: <1.31 ft/sec, indicative of household ambulator   ADL: ADL Overall ADL's : Needs assistance/impaired Eating/Feeding: Set up, Sitting Grooming: Set up, Sitting, Wash/dry hands Upper Body Bathing: Moderate assistance, Sitting Lower Body Bathing: Total assistance, Sit to/from stand, +2 for safety/equipment Upper Body Dressing : Sitting, Maximal assistance Lower Body Dressing: Total assistance, +2 for safety/equipment, Sit to/from stand Toilet Transfer: Minimal assistance, Ambulation, BSC/3in1, Rolling walker (2 wheels) Toileting- Clothing Manipulation and Hygiene: Maximal assistance, Sit to/from stand Toileting - Clothing Manipulation Details (indicate cue type and reason): peri care in standing Functional mobility during ADLs: Minimal assistance, Rolling walker (2 wheels) General ADL Comments: cues for sternal cues. unsteady upon standing    Cognition: Cognition Overall Cognitive Status: Within Functional Limits for tasks assessed Orientation Level: Oriented X4 Cognition Arousal/Alertness: Awake/alert Behavior During Therapy: WFL for tasks assessed/performed Overall Cognitive Status: Within Functional Limits for tasks assessed General Comments: looking to s/o for answers, questionable memory  Physical Exam: Blood pressure 114/66, pulse 68, temperature 98.8 F (37.1 C), temperature source Oral, resp. rate 18, height 6' (1.829 m), weight (!) 165.3 kg, SpO2 93 %. Physical Exam Constitutional:      General: He is not in acute distress.    Appearance: He is obese.  HENT:     Head: Normocephalic.     Right Ear: External ear normal.     Left Ear: External ear normal.     Nose: Nose normal.     Mouth/Throat:     Mouth: Mucous membranes are moist.  Eyes:     Extraocular Movements: Extraocular movements intact.     Pupils: Pupils are equal, round, and reactive to light.  Cardiovascular:     Rate and Rhythm: Normal rate and regular rhythm.     Heart sounds: No murmur heard.    No gallop.  Pulmonary:     Effort: Pulmonary effort is normal. No respiratory distress.     Breath sounds: Normal breath sounds. No wheezing.  Abdominal:     General: Bowel sounds are normal. There is no distension.     Tenderness: There is no abdominal tenderness.  Musculoskeletal:        General: No swelling. Normal range of motion.     Cervical back: Normal range of motion.     Comments: Trace LE edema  Skin:    General: Skin is warm and dry.     Comments: Chest incision CDI. Pressure mark on bridge of nose from cpap mask  Neurological:     Mental Status: He is alert.     Comments: Alert and oriented x 3. Normal insight and awareness. Intact Memory. Normal language and speech. Cranial nerve exam unremarkable. UE motor 4+ to 5/5. LE 4/5 prox to 5/5 distal. Sensory exam normal for light touch and pain in all 4 limbs. No limb ataxia or  cerebellar signs. No abnormal tone appreciated.    Psychiatric:        Mood and Affect: Mood normal.        Behavior: Behavior normal.        Lab Results Last 48 Hours        Results for orders placed or performed during the hospital encounter of 11/10/22 (from the past 48 hour(s))  Glucose, capillary     Status: Abnormal    Collection Time: 11/21/22  3:50 PM  Result Value Ref Range    Glucose-Capillary 121 (H) 70 - 99 mg/dL      Comment: Glucose reference range applies only to samples taken after fasting for at least 8 hours.    Comment 1 Notify RN    Glucose, capillary     Status: Abnormal    Collection Time: 11/21/22  9:15 PM  Result Value Ref Range    Glucose-Capillary 131 (H) 70 - 99 mg/dL      Comment: Glucose reference range applies only to samples taken after fasting for at least 8 hours.    Comment 1 Notify RN      Comment 2 Document in Chart    Basic metabolic panel     Status: Abnormal    Collection Time: 11/22/22  1:22 AM  Result Value Ref Range    Sodium 128 (L) 135 - 145 mmol/L    Potassium 3.3 (L) 3.5 - 5.1 mmol/L    Chloride 90 (L) 98 - 111 mmol/L    CO2 29 22 - 32 mmol/L    Glucose, Bld 148 (H) 70 -  99 mg/dL      Comment: Glucose reference range applies only to samples taken after fasting for at least 8 hours.    BUN 22 (H) 6 - 20 mg/dL    Creatinine, Ser 1.03 0.61 - 1.24 mg/dL    Calcium 9.2 8.9 - 10.3 mg/dL    GFR, Estimated >60 >60 mL/min      Comment: (NOTE) Calculated using the CKD-EPI Creatinine Equation (2021)      Anion gap 9 5 - 15      Comment: Performed at [facility] [address], [place], [place] [address]  Glucose, capillary     Status: Abnormal    Collection Time: 11/22/22  6:20 AM  Result Value Ref Range    Glucose-Capillary 132 (H) 70 - 99 mg/dL      Comment: Glucose reference range applies only to samples taken after fasting for at least 8 hours.    Comment 1 Notify RN      Comment 2 Document in Chart    Glucose, capillary      Status: Abnormal    Collection Time: 11/22/22  9:24 PM  Result Value Ref Range    Glucose-Capillary 138 (H) 70 - 99 mg/dL      Comment: Glucose reference range applies only to samples taken after fasting for at least 8 hours.    Comment 1 Notify RN      Comment 2 Document in Chart    Basic metabolic panel     Status: Abnormal    Collection Time: 11/23/22  2:21 AM  Result Value Ref Range    Sodium 134 (L) 135 - 145 mmol/L    Potassium 4.4 3.5 - 5.1 mmol/L    Chloride 95 (L) 98 - 111 mmol/L    CO2 28 22 - 32 mmol/L    Glucose, Bld 127 (H) 70 - 99 mg/dL      Comment: Glucose reference range applies only to samples taken after fasting for at least 8 hours.    BUN 17 6 - 20 mg/dL    Creatinine, Ser 1.03 0.61 - 1.24 mg/dL    Calcium 9.2 8.9 - 10.3 mg/dL    GFR, Estimated >60 >60 mL/min      Comment: (NOTE) Calculated using the CKD-EPI Creatinine Equation (2021)      Anion gap 11 5 - 15      Comment: Performed at [facility] [address] [place], [place] [address]  Glucose, capillary     Status: Abnormal    Collection Time: 11/23/22  6:14 AM  Result Value Ref Range    Glucose-Capillary 149 (H) 70 - 99 mg/dL      Comment: Glucose reference range applies only to samples taken after fasting for at least 8 hours.    Comment 1 Notify RN      Comment 2 Document in Chart        Imaging Results (Last 48 hours)  No results found.         Blood pressure 114/66, pulse 68, temperature 98.8 F (37.1 C), temperature source Oral, resp. rate 18, height 6' (1.829 m), weight (!) 165.3 kg, SpO2 93 %.   Medical Problem List and Plan: 1. Functional deficits secondary to debility after MI/CABG and associated medical complications             -patient may  shower             -ELOS/Goals: 10-12 days, supervision goals with  PT, OT 2.  Antithrombotics: -DVT/anticoagulation:  Pharmaceutical: Lovenox             -antiplatelet therapy: Plavix and aspirin 81 mg daily   3. Pain Management:  Tylenol, oxycodone   4. Mood/Behavior/Sleep: LCSW to evaluate and provide emotional support             -anxiety disorder: Continue Celexa 40 mg and Klonopin 0.5 mg BID             -antipsychotic agents: n/a   5. Neuropsych/cognition: This patient is capable of making decisions on his own behalf.   6. Skin/Wound Care: Routine skin care checks              7. Fluids/Electrolytes/Nutrition: routine Is and Os and follow-up chemistries   9: GERD: continue Protonix 80 mg    10: Migraine headache: worse since hospitalization -at home uses tylenol or Roxicet 5/325 depending upon severity -uses Ubrelvy as needed as well with middling results.              -Ajovy monthly   11: CAD/ICM/STEMI: s/p CABG times one vessel 1/09, POD #10             -continue sternal precautions   12: OSA on CPAP             -continue HS with oxygen 13: Volume overload: continue Lasix 40 mg daily; follow-up BMP             -daily weights             -Lower extremity edema improved 14: Pneumonia, possible: finished antibiotics, no fever or cough             -O2 via [place] prn, FV   15: Class 3 obesity: BMI = 49.42   16: Seizure disorder:             -Klonopin 0.25-0.5 mg BID             -Lamictal 200 mg q AM and q HS             -Keppra 100 mg BID             -Fycompa 8 mg daily   17: Hyperlipidemia: Crestor 40 mg daily   18: Tobacco use: cessation counseling   19: Hypertension: monitor BP TID and prn             -continue metoprolol 25 mg BID             -home lisinopril not restarted    20: Left hip pain/DJD: surgery pending weight loss; follow-up outpatient   21: Pre-diabetes: no meds       Barbie Banner, PA-C 11/23/2022  I have personally performed a face to face diagnostic evaluation of this patient and formulated the key components of the plan.  Additionally, I have personally reviewed laboratory data, imaging studies, as well as relevant notes and concur with the physician assistant's  documentation above.  The patient's status has not changed from the original H&P.  Any changes in documentation from the acute care chart have been noted above.  Meredith Staggers, MD, [facility]

## 2022-11-23 NOTE — Progress Notes (Addendum)
[address]       [place] [address]             [phone]        10 Days Post-Op Procedure(s) (LRB): OFF PUMP CORONARY ARTERY BYPASS GRAFTING (CABG) X ONE BYPASS USING LEFT INTERNAL MAMMARY ARTERY. (N/A) TRANSESOPHAGEAL ECHOCARDIOGRAM (TEE) (N/A)  Subjective: Patient states only thing that helps his migraines are Percocet.  Objective: Vital signs in last 24 hours: Temp:  [97.7 F (36.5 C)-98.8 F (37.1 C)] 98.8 F (37.1 C) (01/19 0356) Pulse Rate:  [69-85] 69 (01/19 0356) Cardiac Rhythm: Normal sinus rhythm (01/18 2108) Resp:  [15-18] 18 (01/19 0356) BP: (95-127)/(63-81) 110/73 (01/19 0356) SpO2:  [90 %-97 %] 97 % (01/19 0356) Weight:  [165.3 kg] 165.3 kg (01/19 0356)  Pre op weight 164.7 kg Current Weight  11/23/22 (!) 165.3 kg      Intake/Output from previous day: 01/18 0701 - 01/19 0700 In: 600 [P.O.:600] Out: 2350 [Urine:2350]   Physical Exam:  Cardiovascular: RRR, Pulmonary:Clear to auscultation bilaterally Abdomen: Soft, non tender, bowel sounds present. Extremities: Mild bilateral lower extremity edema but has decreased over the last few days Wound: Clean and dry.  No erythema or signs of infection.  Lab Results: CBC: Recent Labs    11/21/22 0148  WBC 10.1  HGB 14.1  HCT 41.5  PLT 236    BMET:  Recent Labs    11/22/22 0122 11/23/22 0221  NA 128* 134*  K 3.3* 4.4  CL 90* 95*  CO2 29 28  GLUCOSE 148* 127*  BUN 22* 17  CREATININE 1.03 1.03  CALCIUM 9.2 9.2     PT/INR:  Lab Results  Component Value Date   INR 1.2 11/13/2022   INR 0.9 11/10/2022   ABG:  INR: Will add last result for INR, ABG once components are confirmed Will add last 4 CBG results once components are confirmed  Assessment/Plan: CV-S/p NSTEMI. On Lopressor 25 mg bid, Plavix 75 mg daily, baby ec asa daily. Pulmonary-He was placed on bi pap over night. He has a history of OSA-on CPAP. On room air this am. Encourage incentive spirometer  and flutter valve Volume overload-on Lasix 40 mg PO daily 4. ID-on Cefepime for possible PNA 5. History of seizures-continue Keppra XR, Fycompa, and Lamictal. Per discussion with patient, Lamictal to be given bid. Also, he has taken Klonopin 0.5 mg bid for years (not PRN) so will change as well 6.  Hyponatremia-sodium this am up to 134. Likely related to previous IV diuresis  7. ID-on Cefepime for possible early PNA. Last dose today. Likely will not need oral antibiotic. No fever, WBC normal, and no PNA seen on CXR, and sputum culture result pending 8. Percocet PRN migraines as per patient, the only medication that helps 9. Hope to go to [facility] 6:58 AM

## 2022-11-23 NOTE — Progress Notes (Signed)
Seen pt to go over education but pt is on CPAP. Pt request that I come back later. Pt does have [facility] book and has been referred to CRP at [facility].

## 2022-11-23 NOTE — TOC Transition Note (Signed)
Transition of Care (TOC) - CM/SW Discharge Note Marvetta Gibbons RN, BSN Transitions of Care Unit 4E- RN Case Manager See Treatment Team for direct phone #   Patient Details  Name: RAYNE LOISEAU MRN: [id number] Date of Birth: 1965/12/30  Transition of Care [facility]) CM/SW Contact:  Dawayne Patricia, RN Phone Number: 11/23/2022, 2:51 PM   Clinical Narrative:    Notified by [facility] rehab liaison that pt [facility].  Pt has been cleared for transition to [facility].  HH and DME needs will be address by [facility] rehab staff prior to transition home.   No further TOC needs noted.    Final next level of care: IP Rehab Facility Barriers to Discharge: No Barriers Identified   Patient Goals and CMS Choice CMS Medicare.gov Compare Post Acute Care list provided to:: Patient    Discharge Placement            [facility] re[facility]          Patient and family notified of of transfer: 11/23/22  Discharge Plan and Services Additional resources added to the After Visit Summary for     Discharge Planning Services: CM Consult Post Acute Care Choice: IP Rehab            DME Agency: NA         HH Agency: NA        Social Determinants of Health ([place]) Interventions SDOH Screenings   Tobacco Use: High Risk (11/14/2022)     Readmission Risk Interventions    11/23/2022    2:51 PM  Readmission Risk Prevention Plan  Post Dischage Appt Not Complete  Appt Comments pt going to INPT rehab  Medication Screening Complete  Transportation Screening Complete

## 2022-11-23 NOTE — Progress Notes (Signed)
James Staggers, MD  Physician Physical Medicine and Rehabilitation   PMR Pre-admission    Signed   Date of Service: 11/17/2022 11:42 AM  Related encounter: ED to Hosp-Admission (Discharged) from 11/10/2022 in [facility] 4E CV SURGICAL PROGRESSIVE CARE   Signed      Show:Clear all '[x]'$ Written'[x]'$ Templated'[x]'$ Copied  Added by: '[x]'$ James Gong, RN'[x]'$ James Torres, James Mutton, RN'[x]'$ James Staggers, MD  '[]'$ Hover for details PMR Admission Coordinator Pre-Admission Assessment   Patient: James Torres is an 57 y.o., male MRN: [id number] DOB: 27-Jul-1966 Height: 6' (182.9 cm) Weight: (!) 165.3 kg   Insurance Information HMO:     PPO: yes     PCP:      IPA:      80/20:      OTHER:  PRIMARY: [employer] Medicare Choice      Policy#: [id number]      Subscriber: self CM Name: James Torres     Phone#: [phone] ext [phone]     Fax#: [phone] Pre-Cert#: [id number]  approved for 7 days with f/u with James [facility] at [place] ext [phone] fax [phone]  Employer:  Benefits:  Phone #: [phone]     Name: 1/16 Eff. Date: 11/05/18     Deduct: $500      Out of Pocket Max: $8850 CIR: $325 co pay per day days 1 until 6      SNF: no co pay per day days 1 until 20; $203 co pay per day days 21 until 100 Outpatient: $25 per visit     Co-Pay: visits per medical neccesity Home Health: 100%      Co-Pay: visits per medical neccesity DME: 80%     Co-Pay: 20% Providers: in network  SECONDARY:       Policy#:      Phone#:    Development worker, community:       Phone#:    The Engineer, petroleum" for patients in Inpatient Rehabilitation Facilities with attached "Privacy Act [place] Records" was provided and verbally reviewed with: Patient   Emergency Contact Information Contact Information       Name Relation Home Work Mobile    [place] Significant other     [phone]    James Torres     [phone]         Current Medical History  Patient Admitting  Diagnosis: Debility, NSTEMI, CABG X 1   History of Present Illness: A 57 year old gentleman with a past history of coronary artery disease having undergone PTCI of the LAD in 2016.  This was after presenting with myocardial infarction.  He has a history of ischemic cardiomyopathy with EF of 30 to 35% and subsequent recovery EF of 45 to 50%.  He also has a past history of dyslipidemia, hypertension, and status post gamma knife therapy for a benign epidermoid right cyst in 2019.  He has a seizure disorder and is managed with 3 anti seizure medications.  [facility] was brought to the emergency room at [facility] by EMS on 11/10/2022 after onset of chest pain at home.  He took 2 nitroglycerin sublingually with relief of symptoms.  In the emergency room, EKG showed mild ST segment depressions inferiorly and nonspecific ST-T wave changes inferior leads.  Initial troponin was 177 and later rose to a peak of 4400.  Chest x-ray showed some minor haziness in the left lateral lung base but was otherwise unremarkable. Chest pain had resolved in the emergency room. He was treated with standard therapy for acute non-ST  elevation myocardial infarction including aspirin, beta-blocker, statin, and IV heparin he was admitted to the hospital.  Left heart catheterization earlier today demonstrated a discrete 99% stenosis of the proximal LAD.  The LAD stent has some irregularities with maximal  20% stenosis.  The CFX and RCA had no occlusive disease.  Dr. Gwenlyn Torres did not feel this very proximal LAD stenosis occurring after the bifurcation of the left main coronary was suitable for PCI. Dr. Gwenlyn Torres asked that [facility] be considered for coronary bypass grafting with left internal mammary artery to the left anterior descending coronary artery.  Patient underwent CABG X1 on 11/13/22 by Dr. Melodie Torres.   Postoperative course complicated with possible bilateral multifocal PNA. Using CPAP overnight and [facility] during the day. Continue  diuresis with Lasix and continue IV Cefepime. Has chronic migraines and has affected his functional abilities on acute hospital.   Patient's medical record from [facility] has been reviewed by the rehabilitation admission coordinator and physician.   Past Medical History      Past Medical History:  Diagnosis Date   Anxiety     Brain tumor ([place])     CAD (coronary artery disease)      a. 03/2015 NSTEMI: LM nl, LAD 50ost, 100p (2.75x38 Synergy DES), 75d, RI nl, LCX minor irregs, OM1 min irregs, RCA 50ost, EF 35-45%.   Depression     GERD (gastroesophageal reflux disease)     Hyperlipidemia     Ischemic cardiomyopathy      a. 03/2015 EF 35-45% by LV gram @ time of NSTEMI;  b. 03/2015 Echo: EF 50-55%, sev HK to DK of dist an, apical, and infap walls.Gr 1 DD.   Kidney stone     Metabolic syndrome 12/17/9415   Migraines     Morbid obesity ([place])     Pre-diabetes     Seizures ([place])      a. Has auras and metalic taste in mouth.  Last one 06/2014- last a few seconds, has one every couple weeks, Dr James Torres is neurologist and is aware.    Sleep apnea 2009    a. On CPAP.   Tobacco abuse      Has the patient had major surgery during 100 days prior to admission? Yes   Family History   family history includes Healthy in his mother, sister, and sister; Heart disease in his father; Stroke in his father.   Current Medications   Current Facility-Administered Medications:    0.9 %  sodium chloride infusion, , Intravenous, Continuous, James Torres, James R, PA-C, Last Rate: 20 mL/hr at 11/20/22 2202, New Bag at 11/20/22 2202   0.9 %  sodium chloride infusion, 250 mL, Intravenous, PRN, James [facility], James Larsson, PA-C   acetaminophen (TYLENOL) tablet 650 mg, 650 mg, Oral, Q6H PRN, James Kindle, MD, 650 mg at 11/20/22 2154   aspirin EC tablet 81 mg, 81 mg, Oral, Daily, 81 mg at 11/23/22 1141 **OR** aspirin chewable tablet 81 mg, 81 mg, Per Tube, Daily, James [facility], James M, PA-C   bisacodyl (DULCOLAX) EC  tablet 10 mg, 10 mg, Oral, Daily, 10 mg at 11/22/22 1002 **OR** bisacodyl (DULCOLAX) suppository 10 mg, 10 mg, Rectal, Daily, [facility], James R, PA-C   Chlorhexidine Gluconate Cloth 2 % PADS 6 each, 6 each, Topical, Daily, Lightfoot, Harrell O, MD, 6 each at 11/21/22 0715   citalopram (CELEXA) tablet 40 mg, 40 mg, Oral, q morning, [facility], James R, PA-C, 40 mg at 11/23/22 1141   clonazePAM (KLONOPIN) tablet 0.5  mg, 0.5 mg, Oral, BID, Lars Pinks M, PA-C, 0.5 mg at 11/23/22 1140   clopidogrel (PLAVIX) tablet 75 mg, 75 mg, Oral, Daily, James [facility], James Larsson, PA-C, 75 mg at 11/23/22 1141   dextromethorphan-guaiFENesin (MUCINEX DM) 30-600 MG per 12 hr tablet 1 tablet, 1 tablet, Oral, BID PRN, James [facility], Bailey C, PA-C   docusate sodium (COLACE) capsule 200 mg, 200 mg, Oral, Daily, James [facility], James R, PA-C, 200 mg at 11/22/22 1002   enoxaparin (LOVENOX) injection 40 mg, 40 mg, Subcutaneous, Q24H, Agarwala, Ravi, MD, 40 mg at 11/21/22 1629   feeding supplement (ENSURE ENLIVE / ENSURE PLUS) liquid 237 mL, 237 mL, Oral, TID BM, Lightfoot, Harrell O, MD, 237 mL at 11/23/22 1143   furosemide (LASIX) tablet 40 mg, 40 mg, Oral, Daily, [facility], Bailey C, PA-C, 40 mg at 11/23/22 1141   lactated ringers infusion, , Intravenous, Continuous, James [facility], James R, PA-C   lamoTRIgine (LAMICTAL) tablet 200 mg, 200 mg, Oral, BID, Lars Pinks M, PA-C, 200 mg at 11/23/22 1142   [DISCONTINUED] levETIRAcetam (KEPPRA) IVPB 1000 mg/100 mL premix, 1,000 mg, Intravenous, Q12H, Stopped at 11/15/22 2113 **OR** levETIRAcetam (KEPPRA XR) 24 hr tablet 1,000 mg, 1,000 mg, Oral, Q12H, Lightfoot, Harrell O, MD, 1,000 mg at 11/23/22 1141   metoprolol tartrate (LOPRESSOR) injection 2.5-5 mg, 2.5-5 mg, Intravenous, Q2H PRN, James Torres, James R, PA-C   metoprolol tartrate (LOPRESSOR) tablet 25 mg, 25 mg, Oral, BID, Einar Grad, RPH, 25 mg at 11/23/22 1142   ondansetron (ZOFRAN) injection 4 mg, 4 mg, Intravenous, Q6H PRN, James Torres, James R,  PA-C, 4 mg at 11/15/22 0092   Oral care mouth rinse, 15 mL, Mouth Rinse, PRN, Lightfoot, Harrell O, MD   oxyCODONE-acetaminophen (PERCOCET/ROXICET) 5-325 MG per tablet 1 tablet, 1 tablet, Oral, Q6H PRN, 1 tablet at 11/23/22 1144 **AND** oxyCODONE (Oxy IR/ROXICODONE) immediate release tablet 2.5 mg, 2.5 mg, Oral, Q6H PRN, Lightfoot, Harrell O, MD, 2.5 mg at 11/23/22 1144   pantoprazole (PROTONIX) EC tablet 80 mg, 80 mg, Oral, Daily, James [facility], James R, PA-C, 80 mg at 11/23/22 1140   perampanel (FYCOMPA) tablet 8 mg, 8 mg, Oral, QHS, James [facility], James R, PA-C, 8 mg at 11/22/22 2107   polyethylene glycol (MIRALAX / GLYCOLAX) packet 17 g, 17 g, Oral, BID, Bowser, Grace E, NP, 17 g at 11/21/22 2257   rosuvastatin (CRESTOR) tablet 40 mg, 40 mg, Oral, QHS, James [facility], James R, PA-C, 40 mg at 11/22/22 2107   sodium chloride flush (NS) 0.9 % injection 3 mL, 3 mL, Intravenous, Q12H, James Torres, Bailey C, PA-C, 3 mL at 11/21/22 2313   sodium chloride flush (NS) 0.9 % injection 3 mL, 3 mL, Intravenous, PRN, James Torres, Bailey C, PA-C, 3 mL at 11/23/22 1147   traMADol (ULTRAM) tablet 50-100 mg, 50-100 mg, Oral, Q4H PRN, James Torres, James R, PA-C, 100 mg at 11/20/22 0735   Patients Current Diet:  Diet Order                  Diet Heart Room service appropriate? Yes; Fluid consistency: Thin  Diet effective now                       Precautions / Restrictions Precautions Precautions: Fall, Sternal, Other (comment) (seizures) Precaution Booklet Issued: Yes (comment) Precaution Comments: h/o partial petit mal seizures (per chart, pt typically knows when they are about to occur) Restrictions Weight Bearing Restrictions: Yes (Sternal precautions) RUE Weight Bearing: Partial weight bearing RUE Partial Weight Bearing Percentage or Pounds: sternal precautions (  sternal precautions) LUE Weight Bearing: Partial weight bearing LUE Partial Weight Bearing Percentage or Pounds: sternal precautions Other Position/Activity  Restrictions: sternal precautions    Has the patient had 2 or more falls or a fall with injury in the past year? Yes   Prior Activity Level Household: Stays home.  Went out about 3 times a month.   Prior Functional Level Self Care: Did the patient need help bathing, dressing, using the toilet or eating? Independent   Indoor Mobility: Did the patient need assistance with walking from room to room (with or without device)? Independent   Stairs: Did the patient need assistance with internal or external stairs (with or without device)? Independent   Functional Cognition: Did the patient need help planning regular tasks such as shopping or remembering to take medications? Independent   Patient Information Are you of Hispanic, Latino/a,or Spanish origin?: A. No, not of Hispanic, Latino/a, or Spanish origin What is your race?: A. White Do you need or want an interpreter to communicate with a doctor or health care staff?: 0. No   Patient's Response To:  Health Literacy and Transportation Is the patient able to respond to health literacy and transportation needs?: Yes Health Literacy - How often do you need to have someone help you when you read instructions, pamphlets, or other written material from your doctor or pharmacy?: Always In the past 12 months, has lack of transportation kept you from medical appointments or from getting medications?: No In the past 12 months, has lack of transportation kept you from meetings, work, or from getting things needed for daily living?: No   Development worker, international aid / [place] Devices/Equipment: CPAP Home Equipment: None   Prior Device Use: Indicate devices/aids used by the patient prior to current illness, exacerbation or injury? None of the above   Current Functional Level Cognition   Overall Cognitive Status: Within Functional Limits for tasks assessed Orientation Level: Oriented X4 General Comments: looking to s/o for answers,  questionable memory    Extremity Assessment (includes Sensation/Coordination)   Upper Extremity Assessment: Overall WFL for tasks assessed (within precautions)  Lower Extremity Assessment: Defer to PT evaluation     ADLs   Overall ADL's : Needs assistance/impaired Eating/Feeding: Set up, Sitting Grooming: Set up, Sitting, Wash/dry hands Upper Body Bathing: Moderate assistance, Sitting Lower Body Bathing: Total assistance, Sit to/from stand, +2 for safety/equipment Upper Body Dressing : Sitting, Maximal assistance Lower Body Dressing: Total assistance, +2 for safety/equipment, Sit to/from stand Toilet Transfer: Minimal assistance, Ambulation, BSC/3in1, Rolling walker (2 wheels) Toileting- Clothing Manipulation and Hygiene: Maximal assistance, Sit to/from stand Toileting - Clothing Manipulation Details (indicate cue type and reason): peri care in standing Functional mobility during ADLs: Minimal assistance, Rolling walker (2 wheels) General ADL Comments: cues for sternal cues. unsteady upon standing     Mobility   Overal bed mobility: Needs Assistance Bed Mobility: Rolling, Sidelying to Sit Rolling: Min assist Sidelying to sit: Min assist Supine to sit: Supervision Sit to sidelying: Mod assist General bed mobility comments: cues for technique, assist to raise trunk and for LEs back into bed     Transfers   Overall transfer level: Needs assistance Equipment used: Rolling walker (2 wheels) Transfers: Sit to/from Stand Sit to Stand: Min assist, Min guard General transfer comment: level of assist varies according to height of surface, use of momentum     Ambulation / Gait / Stairs / Emergency planning/management officer   Ambulation/Gait Ambulation/Gait assistance: Min guard, Herbalist (  Feet): 30 Feet (+ 90' + 104') Assistive device: Rolling walker (2 wheels) Gait Pattern/deviations: Step-through pattern, Decreased stride length, Trunk flexed, Decreased weight shift to left General  Gait Details: pt expressing potential for L knee instability, tennis shoes donned. slow, guarded gait with RW and min guard for balance; pt requiring 3x seated rest break secondary to fatigue/SOB, then progressive LLE instability with minA for stability Gait velocity: Decreased Gait velocity interpretation: <1.31 ft/sec, indicative of household ambulator     Posture / Balance Balance Overall balance assessment: Needs assistance Sitting-balance support: No upper extremity supported, Feet supported Sitting balance-Leahy Scale: Good Standing balance support: Bilateral upper extremity supported, Single extremity supported Standing balance-Leahy Scale: Poor Standing balance comment: reliant on UE support     Special needs/care consideration Home CPAP Bariatric Chronic migraine issues Seizure precautions    Previous Home Environment  Living Arrangements: Spouse/significant other Available Help at Discharge: Family, Available 24 hours/day Type of Home: House Home Layout: One level Home Access: Stairs to enter Entrance Stairs-Rails: Left (wall on Torres) Entrance Stairs-Number of Steps: 3 Bathroom Shower/Tub: Chiropodist: Handicapped height Home Care Services: No   Discharge Living Setting Plans for Discharge Living Setting: Patient's home, House, Lives with (comment) (Lives with GF/fiance.) Type of Home at Discharge: House Discharge Home Layout: One level Discharge Home Access: Stairs to enter Entrance Stairs-Rails: Left Entrance Stairs-Number of Steps: 3 Discharge Bathroom Shower/Tub: Tub/shower unit, Curtain Discharge Bathroom Toilet: Standard Does the patient have any problems obtaining your medications?: No   Social/Family/Support Systems Patient Roles: Other (Comment) (Lives with GF/fiance) Contact Information: Dudley Major - significant other Anticipated Caregiver: Robin Anticipated Caregiver's Contact Information: Dewayne Hatch [phone] Ability/Limitations of Caregiver: GF works from home and can assist. Caregiver Availability: 24/7 Discharge Plan Discussed with Primary Caregiver: Yes Is Caregiver In Agreement with Plan?: Yes Does Caregiver/Family have Issues with Lodging/Transportation while Pt is in Rehab?: No   Goals Patient/Family Goal for Rehab: PT/OT supervision goals Expected length of stay: 10-12 days Pt/Family Agrees to Admission and willing to participate: Yes Program Orientation Provided & Reviewed with Pt/Caregiver Including Roles  & Responsibilities: Yes   Decrease burden of Care through IP rehab admission: N/A   Possible need for SNF placement upon discharge: Not anticipated   Patient Condition: [facility], spoken with CM, and patient and spouse. I met with patient at the bedside for inpatient rehabilitation assessment.  Patient will benefit from ongoing PT and OT, can actively participate in 3 hours of therapy a day 5 days of the week, and can make measurable gains during the admission.  Patient will also benefit from the coordinated team approach during an Inpatient Acute Rehabilitation admission.  The patient will receive intensive therapy as well as Rehabilitation physician, nursing, social worker, and care management interventions.  Due to bladder management, bowel management, safety, skin/wound care, disease management, medication administration, pain management, and patient education the patient requires 24 hour a day rehabilitation nursing.  The patient is currently mod assist overall with mobility and basic ADLs.  Discharge setting and therapy post discharge at home with home health is anticipated.  Patient has agreed to participate in the Acute Inpatient Rehabilitation Program and will admit today.   Preadmission Screen Completed By:  Cleatrice Burke, 11/23/2022 12:29 PM ______________________________________________________________________    Discussed status with Dr. Naaman Plummer on 11/23/22 at 76 and received approval for admission today.   Admission Coordinator:  Cleatrice Burke, RN,  time 1230 Date 11/23/22    Assessment/Plan: Diagnosis: debility after MI/CABG/multiple medical Does the need for close, 24 hr/day Medical supervision in concert with the patient's rehab needs make it unreasonable for this patient to be served in a less intensive setting? Yes Co-Morbidities requiring supervision/potential complications: HTN, morbid obesity, PNA, OSA on CPAP, sz, depression with anxiety Due to bladder management, bowel management, safety, skin/wound care, disease management, medication administration, pain management, and patient education, does the patient require 24 hr/day rehab nursing? Yes Does the patient require coordinated care of a physician, rehab nurse, PT, OT to address physical and functional deficits in the context of the above medical diagnosis(es)? Yes Addressing deficits in the following areas: balance, endurance, locomotion, strength, transferring, bowel/bladder control, bathing, dressing, feeding, grooming, toileting, and psychosocial support Can the patient actively participate in an intensive therapy program of at least 3 hrs of therapy 5 days a week? Yes The potential for patient to make measurable gains while on inpatient rehab is excellent Anticipated functional outcomes upon discharge from inpatient rehab: supervision PT, supervision OT, n/a SLP Estimated rehab length of stay to reach the above functional goals is: 10-12 days Anticipated discharge destination: Home 10. Overall Rehab/Functional Prognosis: excellent     MD Signature: James Staggers, MD, [place] Director Rehabilitation Services 11/23/2022          Revision History

## 2022-11-23 NOTE — Progress Notes (Incomplete)
Inpatient Rehabilitation Admission Medication Review by a Pharmacist  A complete drug regimen review was completed for this patient to identify any potential clinically significant medication issues.  High Risk Drug Classes Is patient taking? Indication by Medication  Antipsychotic {Receiving?:26196} prochlorperazine - PRN nausea/vomiting  Anticoagulant {Receiving?:26196} enoxaparin - VTE ppx  Antibiotic {Receiving?:26196}   Opioid {Receiving?:26196} Percocet - PRN pain   Antiplatelet {Receiving?:26196} Aspirin, clopidogrel - CAD  Hypoglycemics/insulin {Yes or No?:26198}   Vasoactive Medication {Receiving?:26196} furosemide - edema metoprolol - HTN  Chemotherapy {Receiving Chemo?:26197}   Other {Yes or No?:26198} citalopram - mood perampanel, clonazepam, lamotrigine, kevetiracetam - seizure ppx pantoprazole - GERD ppx Rosuvastatin - HLD     Type of Medication Issue Identified Description of Issue Recommendation(s)  Drug Interaction(s) (clinically significant)     Duplicate Therapy     Allergy     No Medication Administration End Date     Incorrect Dose     Additional Drug Therapy Needed     Significant med changes from prior encounter (inform family/care partners about these prior to discharge). Melodie Bouillon non-formulary (not reordered during admission) Communicate medication changes with patient/family at discharge  Other       Clinically significant medication issues were identified that warrant physician communication and completion of prescribed/recommended actions by midnight of the next day:  {Yes or No?:26198}  Name of provider notified for urgent issues identified: ***   Provider Method of Notification: ***    Pharmacist comments: ***   Time spent performing this drug regimen review (minutes): 20   Thank you for allowing pharmacy to be a part of this patient's care.  ***

## 2022-11-23 NOTE — Progress Notes (Signed)
Inpatient Rehabilitation Admissions Coordinator   I have CIR bed to admit him today. I met at bedside with patient and he is in agreement. He has taken Percocet today for his migraine. I will alert acute team and TOC and make the arrangements.  Danne Baxter, RN, MSN Rehab Admissions Coordinator [phone] 11/23/2022 12:25 PM

## 2022-11-23 NOTE — Discharge Instructions (Addendum)
Inpatient Rehab Discharge Instructions  [facility] Discharge date and time:  11/30/2022  Activities/Precautions/ Functional Status: Activity: no lifting, driving, or strenuous exercise until cleared by MD Diet: cardiac diet Continue Sternal precautions.  Wound Care: keep wound clean and dry. Contact Dr. Tera Mater if you develop any problems with your incision/wound--redness, swelling, increase in pain, drainage or if you develop fever or chills.    Functional status:  ___ No restrictions     ___ Walk up steps independently ___ 24/7 supervision/assistance   ___ Walk up steps with assistance _X__ Intermittent supervision/assistance  ___ Bathe/dress independently ___ Walk with walker     ___ Bathe/dress with assistance ___ Walk Independently    ___ Shower independently ___ Walk with assistance    __X_ Shower with assistance _X__ No alcohol  ___ Return to work/school ___N/A_____   Special Instructions:  No pushing, pulling or picking items over 5 lbs. Follow up with Dr. Tera Mater for input on restrictions.  2.  Need to use CPAP when sleeping or  napping.   COMMUNITY REFERRALS UPON DISCHARGE:    Home Health:   PT & OT                  Agency: [facility]    Phone:  [phone]  Medical Equipment/Items Ordered: BARIATRIC ROLLATOR-WILL GET TUB EQUIPMENT ON OWN DUE TO NOT COVERED                                                 Agency/Supplier:[facility]  [phone]      My questions have been answered and I understand these instructions. I will adhere to these goals and the provided educational materials after my discharge from the hospital.  Patient/Caregiver Signature _______________________________ Date __________  Clinician Signature _______________________________________ Date __________  Please bring this form and your medication list with you to all your follow-up doctor's appointments.

## 2022-11-23 NOTE — Progress Notes (Signed)
Physical Therapy Treatment Patient Details Name: James Torres MRN: [id number] DOB: [birth year] Today's Date: 11/23/2022   History of Present Illness Pt is 57 y.o. male admitted 11/10/22 with chest pain; workup for NSTEMI. S/p CABGx1 on 1/9; self-extubated 1/10. PMH includes CAD, PCI, morbid obesity, benign brain tumor, seizure, L hip arthritis (pt reports need for THA).    PT Comments    Pt making good progress with mobility and demonstrated incr ambulation distance today. Used bariatric rollator today. Will have to continue to determine best assistive device on AIR. Continue to recommend AIR and pt is scheduled to transfer there today for further rehab.    Recommendations for follow up therapy are one component of a multi-disciplinary discharge planning process, led by the attending physician.  Recommendations may be updated based on patient status, additional functional criteria and insurance authorization.  Follow Up Recommendations  Acute inpatient rehab (3hours/day)     Assistance Recommended at Discharge Frequent or constant Supervision/Assistance  Patient can return home with the following A little help with walking and/or transfers;A little help with bathing/dressing/bathroom;Help with stairs or ramp for entrance;Assistance with cooking/housework   Equipment Recommendations  Rolling walker (2 wheels);Rollator (4 wheels) (walker vs rollator)    Recommendations for Other Services       Precautions / Restrictions Precautions Precautions: Fall;Sternal;Other (comment) (seizures) Precaution Comments: h/o partial petit mal seizures (per chart, pt typically knows when they are about to occur)     Mobility  Bed Mobility Overal bed mobility: Needs Assistance Bed Mobility: Rolling, Sidelying to Sit Rolling: Min assist Sidelying to sit: Min assist       General bed mobility comments: Assist to bring shoulders over and elevate trunk into sitting.    Transfers Overall transfer  level: Needs assistance Equipment used: Rollator (4 wheels) (bariatric) Transfers: Sit to/from Stand Sit to Stand: Min guard, From elevated surface           General transfer comment: Assist for safety from elevated bed and verbal cues for hands to knees    Ambulation/Gait Ambulation/Gait assistance: Min assist Gait Distance (Feet): 150 Feet Assistive device: Rollator (4 wheels) Gait Pattern/deviations: Step-through pattern, Decreased stride length, Trunk flexed, Wide base of support Gait velocity: decr Gait velocity interpretation: <1.31 ft/sec, indicative of household ambulator   General Gait Details: Assist for balance and support.   Stairs             Wheelchair Mobility    Modified Rankin (Stroke Patients Only)       Balance Overall balance assessment: Needs assistance Sitting-balance support: No upper extremity supported, Feet supported Sitting balance-Leahy Scale: Good     Standing balance support: Bilateral upper extremity supported Standing balance-Leahy Scale: Poor Standing balance comment: reliant on UE support                            Cognition Arousal/Alertness: Awake/alert Behavior During Therapy: WFL for tasks assessed/performed Overall Cognitive Status: Within Functional Limits for tasks assessed                                          Exercises      General Comments General comments (skin integrity, edema, etc.): VSS on RA      Pertinent Vitals/Pain Pain Assessment Pain Assessment: Faces Faces Pain Scale: Hurts even more Pain Location: head Pain Descriptors /  Indicators: Aching Pain Intervention(s): Monitored during session, Premedicated before session    Home Living                          Prior Function            PT Goals (current goals can now be found in the care plan section) Progress towards PT goals: Progressing toward goals    Frequency    Min 3X/week      PT  Plan Current plan remains appropriate    Co-evaluation              AM-PAC PT "6 Clicks" Mobility   Outcome Measure  Help needed turning from your back to your side while in a flat bed without using bedrails?: A Little Help needed moving from lying on your back to sitting on the side of a flat bed without using bedrails?: A Little Help needed moving to and from a bed to a chair (including a wheelchair)?: A Little Help needed standing up from a chair using your arms (e.g., wheelchair or bedside chair)?: A Little Help needed to walk in hospital room?: A Little Help needed climbing 3-5 steps with a railing? : Total 6 Click Score: 16    End of Session Equipment Utilized During Treatment: Gait belt Activity Tolerance: Patient tolerated treatment well Patient left: in chair;with call bell/phone within reach;with chair alarm set Nurse Communication: Mobility status PT Visit Diagnosis: Other abnormalities of gait and mobility (R26.89);Unsteadiness on feet (R26.81);History of falling (Z91.81)     Time: 2831-5176 PT Time Calculation (min) (ACUTE ONLY): 18 min  Charges:  $Gait Training: 8-22 mins                     [facility] 11/23/2022, 2:04 PM

## 2022-11-24 DIAGNOSIS — K5901 Slow transit constipation: Secondary | ICD-10-CM | POA: Diagnosis not present

## 2022-11-24 DIAGNOSIS — R5381 Other malaise: Secondary | ICD-10-CM | POA: Diagnosis not present

## 2022-11-24 DIAGNOSIS — G4700 Insomnia, unspecified: Secondary | ICD-10-CM

## 2022-11-24 LAB — CBC WITH DIFFERENTIAL/PLATELET
Abs Immature Granulocytes: 0.13 K/uL — ABNORMAL HIGH (ref 0.00–0.07)
Basophils Absolute: 0.1 K/uL (ref 0.0–0.1)
Basophils Relative: 1 %
Eosinophils Absolute: 0.1 K/uL (ref 0.0–0.5)
Eosinophils Relative: 2 %
HCT: 39.8 % (ref 39.0–52.0)
Hemoglobin: 13.7 g/dL (ref 13.0–17.0)
Immature Granulocytes: 1 %
Lymphocytes Relative: 20 %
Lymphs Abs: 1.9 K/uL (ref 0.7–4.0)
MCH: 31.4 pg (ref 26.0–34.0)
MCHC: 34.4 g/dL (ref 30.0–36.0)
MCV: 91.1 fL (ref 80.0–100.0)
Monocytes Absolute: 0.7 K/uL (ref 0.1–1.0)
Monocytes Relative: 8 %
Neutro Abs: 6.4 K/uL (ref 1.7–7.7)
Neutrophils Relative %: 68 %
Platelets: 266 K/uL (ref 150–400)
RBC: 4.37 MIL/uL (ref 4.22–5.81)
RDW: 14.1 % (ref 11.5–15.5)
WBC: 9.4 K/uL (ref 4.0–10.5)
nRBC: 0 % (ref 0.0–0.2)

## 2022-11-24 LAB — COMPREHENSIVE METABOLIC PANEL
ALT: 48 U/L — ABNORMAL HIGH (ref 0–44)
AST: 49 U/L — ABNORMAL HIGH (ref 15–41)
Albumin: 2.9 g/dL — ABNORMAL LOW (ref 3.5–5.0)
Alkaline Phosphatase: 102 U/L (ref 38–126)
Anion gap: 9 (ref 5–15)
BUN: 14 mg/dL (ref 6–20)
CO2: 28 mmol/L (ref 22–32)
Calcium: 9.1 mg/dL (ref 8.9–10.3)
Chloride: 95 mmol/L — ABNORMAL LOW (ref 98–111)
Creatinine, Ser: 1.03 mg/dL (ref 0.61–1.24)
GFR, Estimated: >60 mL/min (ref >60)
Glucose, Bld: 134 mg/dL — ABNORMAL HIGH (ref 70–99)
Potassium: 4.4 mmol/L (ref 3.5–5.1)
Sodium: 132 mmol/L — ABNORMAL LOW (ref 135–145)
Total Bilirubin: 0.2 mg/dL — ABNORMAL LOW (ref 0.3–1.2)
Total Protein: 7.3 g/dL (ref 6.5–8.1)

## 2022-11-24 LAB — MAGNESIUM: Magnesium: 2.3 mg/dL (ref 1.7–2.4)

## 2022-11-24 LAB — GLUCOSE, CAPILLARY
Glucose-Capillary: 135 mg/dL — ABNORMAL HIGH (ref 70–99)
Glucose-Capillary: 121 mg/dL — ABNORMAL HIGH (ref 70–99)
Glucose-Capillary: 95 mg/dL (ref 70–99)
Glucose-Capillary: 132 mg/dL — ABNORMAL HIGH (ref 70–99)

## 2022-11-24 MED ORDER — POLYETHYLENE GLYCOL 3350 17 G PO PACK
17.0000 g | PACK | Freq: Two times a day (BID) | ORAL | Status: DC
  Administered 2022-11-24 – 2022-11-30 (×10): 17 g via ORAL
  Filled 2022-11-24 (×13): qty 1

## 2022-11-24 MED ORDER — MELATONIN 5 MG PO TABS: 5.0000 mg | ORAL_TABLET | Freq: Every evening | ORAL | Status: DC | PRN

## 2022-11-24 NOTE — Evaluation (Signed)
Physical Therapy Assessment and Plan  Patient Details  Name: AYCE PIETRZYK MRN: [id number] Date of Birth: 1966/08/25  PT Diagnosis: Difficulty walking, Muscle weakness, and Pain in joint Rehab Potential: Good ELOS: 7-10 days   Today's Date: 11/25/2022 PT Individual Time:  -       Hospital Problem: Principal Problem:   Debility   Past Medical History:  Past Medical History:  Diagnosis Date   Anxiety    Brain tumor ([place])    CAD (coronary artery disease)    a. 03/2015 NSTEMI: LM nl, LAD 50ost, 100p (2.75x38 Synergy DES), 75d, RI nl, LCX minor irregs, OM1 min irregs, RCA 50ost, EF 35-45%.   Depression    GERD (gastroesophageal reflux disease)    Hyperlipidemia    Ischemic cardiomyopathy    a. 03/2015 EF 35-45% by LV gram @ time of NSTEMI;  b. 03/2015 Echo: EF 50-55%, sev HK to DK of dist an, apical, and infap walls.Gr 1 DD.   Kidney stone    Metabolic syndrome 7/61/9509   Migraines    Morbid obesity ([place])    Pre-diabetes    Seizures ([place])    a. Has auras and metalic taste in mouth.  Last one 06/2014- last a few seconds, has one every couple weeks, Dr Mare Loan is neurologist and is aware.    Sleep apnea 2009   a. On CPAP.   Tobacco abuse    Past Surgical History:  Past Surgical History:  Procedure Laterality Date   BRAIN SURGERY  2004   Crainotomy for tumor   CARDIAC CATHETERIZATION N/A 03/21/2015   Procedure: Left Heart Cath and Coronary Angiography;  Surgeon: Sherren Mocha, MD;  Location: [facility];  Service: Cardiovascular;  Laterality: N/A;   CORONARY ARTERY BYPASS GRAFT N/A 11/13/2022   Procedure: OFF PUMP CORONARY ARTERY BYPASS GRAFTING (CABG) X ONE BYPASS USING LEFT INTERNAL MAMMARY ARTERY.;  Surgeon: Lajuana Matte, MD;  Location: [facility];  Service: Open Heart Surgery;  Laterality: N/A;   KNEE ARTHROSCOPY Right 2005 approx   cartliage   LEFT HEART CATH AND CORONARY ANGIOGRAPHY N/A 11/12/2022   Procedure: LEFT HEART CATH AND CORONARY ANGIOGRAPHY;   Surgeon: Lorretta Harp, MD;  Location: [facility];  Service: Cardiovascular;  Laterality: N/A;   TEE WITHOUT CARDIOVERSION N/A 11/13/2022   Procedure: TRANSESOPHAGEAL ECHOCARDIOGRAM (TEE);  Surgeon: Lajuana Matte, MD;  Location: [facility];  Service: Open Heart Surgery;  Laterality: N/A;   ULNAR NERVE TRANSPOSITION Left 06/25/2014   Procedure: LEFT ULNAR NEUROPLASTY AT ELBOW;  Surgeon: Jolyn Nap, MD;  Location: [facility];  Service: Orthopedics;  Laterality: Left;   WISDOM TOOTH EXTRACTION      Assessment & Plan Clinical Impression: Patient is a 57 y.o. male who presented to the ED on 11/10/2022 with chest pain. Known history of coronary artery disease with prior NSTEMI and stent placement in 2016. No complaints of SOB or cough. Initial EKG concerning for possible acure ischemia. Elevated troponins. Cardiology consulted and treated for ACS. Medical history significant for seizure disorder on multiple anti-seizure medications. Echo 11/10/22: left ventricular ejection fraction, by estimation, is 45 to 50%. Underwent cardiac catheterization on 1/08 by Dr. Gwenlyn Found which demonstrated a discrete 99% stenosis of the proximal LAD. He felt he would be best served with a LIMA graft to his LAD. CT surgery was consulted. He underwent CABG on 1/09 with Dr. [name]. Transferred to ICU on ventilator  and PCCM consulted. He self-extubated on 1/10. Was able to  come of BiPAP on 1/11. Lovenox for DVT prophy. Developed migraine headache of which is has a history. Fitting with CPAP mask and doing well. Tolerating diet. Required HF oxygen use and Lasix continued for diuresis. Developed purulent sputum and chills and started empiric antibiotics 1/14. A1c 6.1%. Semglee and SSI given. Transferred out of ICU on 1/16. H and H stable. The patient requires inpatient physical medicine and rehabilitation evaluations and treatment secondary to dysfunction due to STEMI, CAD s/p CABG.   PMH significant for seizure disorder and  is status post gamma knife therapy for a benign epidermoid right cyst in 2019.  Patient transferred to CIR on 11/23/2022 .   Patient currently requires min assist with mobility secondary to muscle weakness, decreased cardiorespiratoy endurance, and decreased balance strategies.  Prior to hospitalization, patient was modified independent  with mobility and lived with Significant other in a House home.  Home access is 3Stairs to enter.  Patient will benefit from skilled PT intervention to maximize safe functional mobility, minimize fall risk, and decrease caregiver burden for planned discharge home with 24 hour supervision.  Anticipate patient will  benefit from cardiac rehab as well as OPPT  at discharge.  PT - End of Session Activity Tolerance: Tolerates 10 - 20 min activity with multiple rests;Tolerates 30+ min activity with multiple rests Endurance Deficit: Yes Endurance Deficit Description: just weaned off O2; needs frequent rest breaks; reports feeling more SOB with activity dyspena 2/4 PT Assessment Rehab Potential (ACUTE/IP ONLY): Good PT Barriers to Discharge: [place] home environment;Home environment access/layout;Wound Care;Insurance for SNF coverage;Weight;Nutrition means PT Patient demonstrates impairments in the following area(s): Balance;Edema;Endurance;Motor;Nutrition;Safety;Skin Integrity PT Transfers Functional Problem(s): Bed Mobility;Bed to Chair;Car;Furniture PT Locomotion Functional Problem(s): Stairs PT Plan PT Intensity: Minimum of 1-2 x/day ,45 to 90 minutes PT Frequency: 5 out of 7 days PT Duration Estimated Length of Stay: 7-10 days PT Treatment/Interventions: Ambulation/gait training;Community reintegration;DME/adaptive equipment instruction;Neuromuscular re-education;Psychosocial support;Stair training;UE/LE Strength taining/ROM;Balance/vestibular training;Discharge planning;Functional electrical stimulation;Pain management;Skin care/wound management;Therapeutic  Activities;UE/LE Coordination activities;Disease management/prevention;Functional mobility training;Patient/family education;Therapeutic Exercise;Visual/perceptual remediation/compensation PT Transfers Anticipated Outcome(s): Mod Ind PT Locomotion Anticipated Outcome(s): Supervision PT Recommendation Recommendations for Other Services: Therapeutic Recreation consult Therapeutic Recreation Interventions: Stress management Follow Up Recommendations: Home health PT;24 hour supervision/assistance Patient destination: Home Equipment Recommended: To be determined   PT Evaluation Precautions/Restrictions Precautions Precautions: Fall;Sternal;Other (comment) Precaution Comments: h/o partial petit mal seizures (per chart, pt typically knows when they are about to occur) General   Vital SignsTherapy Vitals Temp: 98.4 F (36.9 C) Temp Source: Oral Pulse Rate: 73 Resp: 16 BP: (!) 100/54 Patient Position (if appropriate): Lying Oxygen Therapy SpO2: 96 % O2 Device: Room Air Pain Pain Assessment Pain Scale: 0-10 Pain Score: 0-No pain Pain Interference Pain Interference Pain Effect on Sleep: 1. Rarely or not at all Pain Interference with Therapy Activities: 1. Rarely or not at all Pain Interference with Day-to-Day Activities: 1. Rarely or not at all Home Living/Prior [place] Available Help at Discharge: Family;Available 24 hours/day Type of Home: House Home Access: Stairs to enter [employer] of Steps: 3 Entrance Stairs-Rails: Left (wall to R) Home Layout: One level Bathroom Shower/Tub: Chiropodist: Handicapped height  Lives With: Significant other Prior Function Level of Independence: Independent with basic ADLs;Independent with homemaking with ambulation;Independent with gait;Requires assistive device for independence;Independent with transfers  Able to Take Stairs?: Yes Driving: No (d/t hx of seizures) Vision/Perception  Vision -  History Ability to See in Adequate Light: 0 Adequate Perception Perception: Within Functional Limits  Praxis Praxis: Intact  Cognition Overall Cognitive Status: Within Functional Limits for tasks assessed Arousal/Alertness: Awake/alert Orientation Level: Oriented X4 Attention: Focused;Sustained;Selective Focused Attention: Appears intact Sustained Attention: Appears intact Selective Attention: Appears intact Memory: Appears intact Awareness: Appears intact Problem Solving: Appears intact Safety/Judgment: Appears intact Sensation Sensation Light Touch: Appears Intact Hot/Cold: Appears Intact Proprioception: Appears Intact Coordination Gross Motor Movements are Fluid and Coordinated: Yes Fine Motor Movements are Fluid and Coordinated: Yes Motor  Motor Motor: Within Functional Limits Motor - Skilled Clinical Observations: generalized weakness and fatigue; tentatively steps to LLE d/t potential buckle d/t severe hip OA.   Trunk/Postural Assessment  Cervical Assessment Cervical Assessment: Within Functional Limits Thoracic Assessment Thoracic Assessment: Within Functional Limits Lumbar Assessment Lumbar Assessment: Within Functional Limits Postural Control Postural Control: Within Functional Limits  Balance Balance Balance Assessed: Yes Dynamic Sitting Balance Dynamic Sitting - Balance Support: Feet supported Dynamic Sitting - Level of Assistance: 5: Stand by assistance Static Standing Balance Static Standing - Balance Support: During functional activity;Bilateral upper extremity supported Static Standing - Level of Assistance: 5: Stand by assistance Dynamic Standing Balance Dynamic Standing - Balance Support: No upper extremity supported Dynamic Standing - Level of Assistance: Other (comment) (CGA) Extremity Assessment  RUE Assessment RUE Assessment: Within Functional Limits LUE Assessment LUE Assessment: Within Functional Limits RLE Assessment RLE Assessment:  Within Functional Limits General Strength Comments: grossly 4+/ 5 prox to distal LLE Assessment LLE Assessment: Exceptions to [facility] General Strength Comments: grossly 4- to 4/ 5 at hip, [facility] for knee and ankle mobility  Care Tool Care Tool Bed Mobility Roll left and right activity   Roll left and right assist level: Supervision/Verbal cueing    Sit to lying activity   Sit to lying assist level: Minimal Assistance - Patient > 75%    Lying to sitting on side of bed activity   Lying to sitting on side of bed assist level: the ability to move from lying on the back to sitting on the side of the bed with no back support.: Minimal Assistance - Patient > 75%     Care Tool Transfers Sit to stand transfer   Sit to stand assist level: Minimal Assistance - Patient > 75%    Chair/bed transfer   Chair/bed transfer assist level: Minimal Assistance - Patient > 75%     Toilet transfer   Assist Level: Minimal Assistance - Patient > 75%    Car transfer   Car transfer assist level: Minimal Assistance - Patient > 75%      Care Tool Locomotion Ambulation   Assist level: Contact Guard/Touching assist Assistive device: Walker-rolling Max distance: 135 ft  Walk 10 feet activity   Assist level: Contact Guard/Touching assist Assistive device: No Device   Walk 50 feet with 2 turns activity   Assist level: Contact Guard/Touching assist Assistive device: Walker-rolling  Walk 150 feet activity   Assist level: Contact Guard/Touching assist Assistive device: Walker-rolling  Walk 10 feet on uneven surfaces activity Walk 10 feet on uneven surfaces activity did not occur: Safety/medical concerns      Stairs Stair activity did not occur: Safety/medical concerns        Walk up/down 1 step activity Walk up/down 1 step or curb (drop down) activity did not occur: Safety/medical concerns      Walk up/down 4 steps activity Walk up/down 4 steps activity did not occur: Safety/medical concerns      Walk  up/down 12 steps activity Walk up/down 12 steps activity did not occur:  Safety/medical concerns      Pick up small objects from floor   Pick up small object from the floor assist level: Moderate Assistance - Patient 50 - 74%    Wheelchair Is the patient using a wheelchair?: Yes Type of Wheelchair: Manual   Wheelchair assist level: Dependent - Patient 0% Max wheelchair distance: 200 ft  Wheel 50 feet with 2 turns activity   Assist Level: Dependent - Patient 0%  Wheel 150 feet activity   Assist Level: Dependent - Patient 0%    Refer to Care Plan for Long Term Goals  SHORT TERM GOAL WEEK 1 PT Short Term Goal 1 (Week 1): STG = LTG d/t ELOS  Recommendations for other services: Therapeutic Recreation  Kitchen group and Stress management  Skilled Therapeutic Intervention Mobility Bed Mobility Bed Mobility: Rolling Left;Left Sidelying to Sit;Sit to Sidelying Left;Scooting to [facility] Rolling Left: Supervision/Verbal cueing Left Sidelying to Sit: Minimal Assistance - Patient >75% Sit to Sidelying Left: Minimal Assistance - Patient > 75% Scooting to HOB: Moderate Assistance - Patient 50-74% Transfers Transfers: Sit to Stand;Stand to Sit;Stand Pivot Transfers Sit to Stand: Contact Guard/Touching assist Stand to Sit: Contact Guard/Touching assist Stand Pivot Transfers: Contact Guard/Touching assist Transfer (Assistive device): Rolling walker Locomotion  Gait Ambulation: Yes Gait Assistance: Contact Guard/Touching assist Gait Distance (Feet): 135 Feet Assistive device: Rolling walker Gait Gait: Yes Gait Pattern: Decreased stance time - left;Decreased hip/knee flexion - left;Decreased dorsiflexion - right;Wide base of support Gait velocity: decreased Stairs / Additional Locomotion Stairs: No Wheelchair Mobility Wheelchair Mobility: No (contraindicated d/t sternal precautions)  Skilled Intervention: PT Evaluation completed; see above for results. PT educated patient in roles of PT  vs OT, PT POC, rehab potential, rehab goals, and discharge recommendations along with recommendation for follow-up rehabilitation services. Individual treatment initiated:  Patient supine in bed upon PT arrival. Patient alert and agreeable to PT session. No pain complaint during session.  Acquired bari RW, 22x18" w/c and bari rollator for use during rehab stay. Pt will require either RW or rollator on current d/c as well as following future L THA surgery.   Therapeutic Activity: Discussed precautions and pt able to relate correctly as well as demonstrate good technique for all mobility.  Bed Mobility: Patient performed supine to/from sit using log roll technique to L and demos difficulty with pushing to upright seated position to R . Requires minA to complete in lateral push to sit upright. May require at least elevated HOB on d/c with decision to be made for wedge pillow vs hospital bed dependent on breathing as well as progress in bed mobility.  Provided verbal cues for technique throughout. Transfers: Patient performed sit <> stand from elevated bed surface with arms crossed over chest and 3x rock prior to effort. Completes with light CGA for forward lean. Provided visual demonstration for hip hinge vs spinal flexion. Also demonstrated hand and foot positioning for improved lean and unweight from seat. Improved technique throughout session with decreased need for momentum build in rocking.   Stand pivot performed with CGA/ supervision with attn to L LE d/t pt-related buckle from hip OA.   Car transfer simulated at mat table with box on step stool to mimic footwell of car. Pt requires CGA to produce lateral scoot for repositioning in middle of "seat" with pivot into "car". Supervision to pivot out of car.   Gait Training:  Patient ambulated 135' x1/ 120' x1 using bari RW with CGA. Ambulated with WBOS, reduced time on LLE and  decreased hip/ knee flexion. Provided vc/tc for conscious hold to LLE  during stance phase.  Pt is fatigued at end of session. Stand pivot initiated with one rock forward to rise to stand. Reaches bed with close supervision. Patient supine in bed at end of session with brakes locked, bed alarm set, and all needs within reach. Oriented to time and time of next session for eval with OT.    Discharge Criteria: Patient will be discharged from PT if patient refuses treatment 3 consecutive times without medical reason, if treatment goals not met, if there is a change in medical status, if patient makes no progress towards goals or if patient is discharged from hospital.  The above assessment, treatment plan, treatment alternatives and goals were discussed and mutually agreed upon: by patient  Alger Simons PT, DPT, CSRS 11/24/2022, 1:35 PM

## 2022-11-24 NOTE — Plan of Care (Signed)
  Problem: RH Balance Goal: LTG Patient will maintain dynamic standing with ADLs (OT) Description: LTG:  Patient will maintain dynamic standing balance with assist during activities of daily living (OT)  Flowsheets (Taken 11/24/2022 1441) LTG: Pt will maintain dynamic standing balance during ADLs with: Independent with assistive device   Problem: Sit to Stand Goal: LTG:  Patient will perform sit to stand in prep for activites of daily living with assistance level (OT) Description: LTG:  Patient will perform sit to stand in prep for activites of daily living with assistance level (OT) Flowsheets (Taken 11/24/2022 1441) LTG: PT will perform sit to stand in prep for activites of daily living with assistance level: Independent with assistive device   Problem: RH Bathing Goal: LTG Patient will bathe all body parts with assist levels (OT) Description: LTG: Patient will bathe all body parts with assist levels (OT) Flowsheets (Taken 11/24/2022 1441) LTG: Pt will perform bathing with assistance level/cueing: Minimal Assistance - Patient > 75%   Problem: RH Dressing Goal: LTG Patient will perform upper body dressing (OT) Description: LTG Patient will perform upper body dressing with assist, with/without cues (OT). Flowsheets (Taken 11/24/2022 1441) LTG: Pt will perform upper body dressing with assistance level of: Independent with assistive device Goal: LTG Patient will perform lower body dressing w/assist (OT) Description: LTG: Patient will perform lower body dressing with assist, with/without cues in positioning using equipment (OT) Flowsheets (Taken 11/24/2022 1441) LTG: Pt will perform lower body dressing with assistance level of: Minimal Assistance - Patient > 75%   Problem: RH Toileting Goal: LTG Patient will perform toileting task (3/3 steps) with assistance level (OT) Description: LTG: Patient will perform toileting task (3/3 steps) with assistance level (OT)  Flowsheets (Taken 11/24/2022  1441) LTG: Pt will perform toileting task (3/3 steps) with assistance level: Independent with assistive device   Problem: RH Toilet Transfers Goal: LTG Patient will perform toilet transfers w/assist (OT) Description: LTG: Patient will perform toilet transfers with assist, with/without cues using equipment (OT) Flowsheets (Taken 11/24/2022 1441) LTG: Pt will perform toilet transfers with assistance level of: Independent with assistive device   Problem: RH Tub/Shower Transfers Goal: LTG Patient will perform tub/shower transfers w/assist (OT) Description: LTG: Patient will perform tub/shower transfers with assist, with/without cues using equipment (OT) Flowsheets (Taken 11/24/2022 1441) LTG: Pt will perform tub/shower stall transfers with assistance level of: (pt would like to try using a tub bench for safety in shower) Supervision/Verbal cueing LTG: Pt will perform tub/shower transfers from: Tub/shower combination

## 2022-11-24 NOTE — Progress Notes (Signed)
Physical Therapy Session Note  Patient Details  Name: James Torres MRN: [id number] Date of Birth: 1966/01/01  Today's Date: 11/24/2022 PT Individual Time: 1430-1545 PT Individual Time Calculation (min): 75 min   Short Term Goals: Week 1:  PT Short Term Goal 1 (Week 1): STG = LTG d/t ELOS  Skilled Therapeutic Interventions/Progress Updates:  Patient greeted supine in bed with spouse present and agreeable to PT treatment session. Therapist and spouse discussed home set-up, discharge planning, DME needs, sternal precautions (move in the tube), etc at length in order to prepare for a safe discharge home. Spouse is to take photos of the bathroom and entry stairs in order to prepare for discharge. Patient transitioned from supine to sitting EOB with Supv and use of bed rail with appropriate adherence to sternal precautions. While sitting EOB, patient doffed gown and donned tshirt and then doffed hospital socks and donned socks and shoes with ModA. Patient attempted to stand from EOB at its lowest height with use of HDRW and SBA, however unable to stand upon first or second attempt. Patient educated extensively regarding proper hand placement, increased anterior weight shift due to his height and rocking forward x3 in order to gain momentum for standing. Patient was successful on third stand with good carryover of education. Patient gait trained to/from his room and day room gym (>150') with HDRW and SBA/Supv for safety. Patient demonstrated heel-toe pattern with appropriate UE use and no LOB noted. Patient ascended/descended x4 steps with B HR (one step with L HR in order to simulate home environment) and CGA for safety- Patient utilized a step-to pattern leading with R LE while ascending and L LE while descending. Patient required an extended seated rest break after gait trial and stair mobility secondary to fatigue. Once back in his room, patient requested to use the bathroom- patient ambulated into/out  of the bathroom with HDRW and Supv. Patient with continent episode of voiding (see flowsheet) and was able to manage pants independently. Patient required x2 attempts to stand from low toilet height, however with VC for increased anterior weight shift and 3 forward rocks to gain momentum patient was successful. Patient left sitting EOB in room with spouse present, call bell within reach and all needs met.    Therapy Documentation Precautions:  Precautions Precautions: Fall, Sternal, Other (comment) Precaution Comments: h/o partial petit mal seizures (per chart, pt typically knows when they are about to occur) Restrictions Weight Bearing Restrictions:  (sternal precaution)   Therapy/Group: Individual Therapy  Deari Sessler 11/24/2022, 3:09 PM

## 2022-11-24 NOTE — Progress Notes (Signed)
PROGRESS NOTE   Subjective/Complaints: Pain doing ok. Slept poorly which has been ongoing since he came to the hospital, just from not being at home and having to sleep on his back. At home would take '10mg'$  melatonin usually, so would like this ordered if able. Hasn't had a BM in 2 days, was previously receiving miralax BID in the hospital. Otherwise has no additional complaints or concerns. Does get chronic migraines, not having one now though.    ROS: +migraines (chronic). +insomnia. +constipation. Denies fevers, chills, CP, SOB, abd pain, N/V/D, new/worsening numbness, tingling, focal weakness, or any other complaints at this time.   Objective:   No results found. No results for input(s): "WBC", "HGB", "HCT", "PLT" in the last 72 hours. Recent Labs    11/22/22 0122 11/23/22 0221  NA 128* 134*  K 3.3* 4.4  CL 90* 95*  CO2 29 28  GLUCOSE 148* 127*  BUN 22* 17  CREATININE 1.03 1.03  CALCIUM 9.2 9.2    Intake/Output Summary (Last 24 hours) at 11/24/2022 3267 Last data filed at 11/24/2022 0406 Gross per 24 hour  Intake 480 ml  Output 1100 ml  Net -620 ml        Physical Exam: Vital Signs Blood pressure 110/70, pulse 71, temperature 97.9 F (36.6 C), temperature source Oral, resp. rate 18, height 6' (1.829 m), weight (!) 164.1 kg, SpO2 97 %.  Constitutional:      General: He is not in acute distress.    Appearance: He is obese.  HENT: [place]/AT, MMM Eyes: EOMI, PERRL Cardiovascular: RRR, nl s1/s2, no m/r/g Pulmonary: CTAB, no w/r/r, normal effort, no distress Abdominal: soft, obese but nondistended, nonTTP, no r/g/r, +BS throughout Musculoskeletal:        General: No swelling. Normal range of motion.     Cervical back: Normal range of motion.     Comments: Trace LE edema  Skin:    General: Skin is warm and dry.     Comments: Chest incision CDI. Pressure mark on bridge of nose from cpap mask--not present this  morning during rounds  Neurological:     Mental Status: He is alert.     Comments: Alert and oriented x 3. Normal insight and awareness. Intact Memory. Normal language and speech. Cranial nerve exam unremarkable. UE motor 4+ to 5/5. LE 4/5 prox to 5/5 distal. Sensory exam normal for light touch and pain in all 4 limbs. No limb ataxia or cerebellar signs. No abnormal tone appreciated.    Psychiatric:        Mood and Affect: Mood normal.        Behavior: Behavior normal.    Assessment/Plan: 1. Functional deficits which require 3+ hours per day of interdisciplinary therapy in a comprehensive inpatient rehab setting. Physiatrist is providing close team supervision and 24 hour management of active medical problems listed below. Physiatrist and rehab team continue to assess barriers to discharge/monitor patient progress toward functional and medical goals  Care Tool:  Bathing              Bathing assist       Upper Body Dressing/Undressing Upper body dressing  Upper body assist      Lower Body Dressing/Undressing Lower body dressing            Lower body assist       Toileting Toileting    Toileting assist       Transfers Chair/bed transfer  Transfers assist           Locomotion Ambulation   Ambulation assist              Walk 10 feet activity   Assist           Walk 50 feet activity   Assist           Walk 150 feet activity   Assist           Walk 10 feet on uneven surface  activity   Assist           Wheelchair     Assist               Wheelchair 50 feet with 2 turns activity    Assist            Wheelchair 150 feet activity     Assist          Blood pressure 110/70, pulse 71, temperature 97.9 F (36.6 C), temperature source Oral, resp. rate 18, height 6' (1.829 m), weight (!) 164.1 kg, SpO2 97 %.  Medical Problem List and Plan: 1. Functional deficits secondary to  debility after MI/CABG and associated medical complications             -patient may  shower             -ELOS/Goals: 10-12 days, supervision goals with PT, OT 2.  Antithrombotics: -DVT/anticoagulation:  Pharmaceutical: Lovenox '40mg'$  QD             -antiplatelet therapy: Plavix '75mg'$  QD and aspirin 81 mg daily   3. Pain Management: Tylenol and oxycodone PRN   4. Mood/Behavior/Sleep: LCSW to evaluate and provide emotional support             -anxiety disorder: Continue Celexa 40 mg and Klonopin 0.5 mg BID             -antipsychotic agents: n/a  -11/24/22 ordered Melatonin 5-'10mg'$  QHS PRN for sleep/insomnia   5. Neuropsych/cognition: This patient is capable of making decisions on his own behalf.   6. Skin/Wound Care: Routine skin care checks              7. Fluids/Electrolytes/Nutrition: routine Is and Os and follow-up chemistries (weekly BMP starting 11/26/22) -11/24/22 Na 132, Cl 95 fairly stable; Albumin low 2.9, AST 49/ALT 48 mildly elevated--likely transient transaminitis vs fatty liver-- consider recheck of LFTs next week   9: GERD: continue Protonix 80 mg    10: Migraine headache: worse since hospitalization -at home uses tylenol or Roxicet 5/325 depending upon severity -uses Ubrelvy as needed as well with middling results--not on formulary per patient             -Ajovy monthly  -11/24/22 currently ok, has tylenol/roxicodone/percocet PRNs   11: CAD/ICM/STEMI: s/p CABG times one vessel 11/13/22, POD #11             -continue sternal precautions   12: OSA on CPAP             -continue HS with oxygen  13: Volume overload: continue Lasix 40 mg daily; follow-up BMP             -  daily weights             -Lower extremity edema improved -11/24/22 mild hyponatremia/hypochloremia but stable (Na 132, Cl 95); monitor on weekly labs -11/24/22 weights stable/improving, continue to monitor Filed Weights   11/23/22 1603 11/24/22 0500  Weight: (!) 165.1 kg (!) 164.1 kg    14: Pneumonia,  possible: finished antibiotics, no fever or cough             -O2 via [place] prn, FV   15: Class 3 obesity: BMI = 49.42, diet counseling would be beneficial   16: Seizure disorder:             -Klonopin 0.25-0.5 mg BID             -Lamictal 200 mg q AM and q HS             -Keppra XR 1000 mg BID             -Fycompa 8 mg daily   17: Hyperlipidemia: Crestor 40 mg daily   18: Tobacco use: cessation counseling   19: Hypertension: monitor BP TID and prn             -continue metoprolol 25 mg BID             -home lisinopril not restarted   -11/24/22 BPs soft/normal, continue holding lisinopril, monitor Vitals:   11/23/22 1603 11/23/22 1950 11/24/22 0406  BP: (!) 107/51 (!) 104/56 110/70      20: Left hip pain/DJD: surgery pending weight loss; follow-up outpatient   21: Pre-diabetes: Hgb A1C 6.1 on 11/10/22, no meds currently  22: Constipation: current regimen Colace '200mg'$  QD, MoM PRN, Sorbitol PRN, Fleet PRN  -11/24/22 LBM 2 days ago, ordered Miralax 17g BID, monitor for effect  LOS: 1 days A FACE TO [place] 11/24/2022, 8:22 AM

## 2022-11-24 NOTE — Evaluation (Signed)
Occupational Therapy Assessment and Plan  Patient Details  Name: James Torres MRN: [id number] Date of Birth: Dec 02, 1965  OT Diagnosis: muscle weakness (generalized) Rehab Potential: Rehab Potential (ACUTE ONLY): Good ELOS: ~7-10 days   Today's Date: 11/24/2022 OT Individual Time: 1104-1200 OT Individual Time Calculation (min): 56 min     Hospital Problem: Principal Problem:   Debility   Past Medical History:  Past Medical History:  Diagnosis Date   Anxiety    Brain tumor ([place])    CAD (coronary artery disease)    a. 03/2015 NSTEMI: LM nl, LAD 50ost, 100p (2.75x38 Synergy DES), 75d, RI nl, LCX minor irregs, OM1 min irregs, RCA 50ost, EF 35-45%.   Depression    GERD (gastroesophageal reflux disease)    Hyperlipidemia    Ischemic cardiomyopathy    a. 03/2015 EF 35-45% by LV gram @ time of NSTEMI;  b. 03/2015 Echo: EF 50-55%, sev HK to DK of dist an, apical, and infap walls.Gr 1 DD.   Kidney stone    Metabolic syndrome 02/11/8118   Migraines    Morbid obesity ([place])    Pre-diabetes    Seizures ([place])    a. Has auras and metalic taste in mouth.  Last one 06/2014- last a few seconds, has one every couple weeks, Dr Mare Loan is neurologist and is aware.    Sleep apnea 2009   a. On CPAP.   Tobacco abuse    Past Surgical History:  Past Surgical History:  Procedure Laterality Date   BRAIN SURGERY  2004   Crainotomy for tumor   CARDIAC CATHETERIZATION N/A 03/21/2015   Procedure: Left Heart Cath and Coronary Angiography;  Surgeon: Sherren Mocha, MD;  Location: [facility];  Service: Cardiovascular;  Laterality: N/A;   CORONARY ARTERY BYPASS GRAFT N/A 11/13/2022   Procedure: OFF PUMP CORONARY ARTERY BYPASS GRAFTING (CABG) X ONE BYPASS USING LEFT INTERNAL MAMMARY ARTERY.;  Surgeon: Lajuana Matte, MD;  Location: [facility];  Service: Open Heart Surgery;  Laterality: N/A;   KNEE ARTHROSCOPY Right 2005 approx   cartliage   LEFT HEART CATH AND CORONARY ANGIOGRAPHY N/A 11/12/2022    Procedure: LEFT HEART CATH AND CORONARY ANGIOGRAPHY;  Surgeon: Lorretta Harp, MD;  Location: [facility];  Service: Cardiovascular;  Laterality: N/A;   TEE WITHOUT CARDIOVERSION N/A 11/13/2022   Procedure: TRANSESOPHAGEAL ECHOCARDIOGRAM (TEE);  Surgeon: Lajuana Matte, MD;  Location: [facility];  Service: Open Heart Surgery;  Laterality: N/A;   ULNAR NERVE TRANSPOSITION Left 06/25/2014   Procedure: LEFT ULNAR NEUROPLASTY AT ELBOW;  Surgeon: Jolyn Nap, MD;  Location: [facility];  Service: Orthopedics;  Laterality: Left;   WISDOM TOOTH EXTRACTION      Assessment & Plan Clinical Impression: Patient is a 57 y.o. year old male who presented to the ED on 11/10/2022 with chest pain. Known history of coronary artery disease with prior NSTEMI and stent placement in 2016. No complaints of SOB or cough. Initial EKG concerning for possible acure ischemia. Elevated troponins. Cardiology consulted and treated for ACS. Medical history significant for seizure disorder on multiple anti-seizure medications. Echo 11/10/22: left ventricular ejection fraction, by estimation, is 45 to 50%. Underwent cardiac catheterization on 1/08 by Dr. Gwenlyn Found which demonstrated a discrete 99% stenosis of the proximal LAD. He felt he would be best served with a LIMA graft to his LAD. CT surgery was consulted. He underwent CABG on 1/09 with Dr. [name]. Transferred to ICU on ventilator  and PCCM consulted. He self-extubated  on 1/10. Was able to come of BiPAP on 1/11. Lovenox for DVT prophy. Developed migraine headache of which is has a history. Fitting with CPAP mask and doing well. Tolerating diet. Required HF oxygen use and Lasix continued for diuresis. Developed purulent sputum and chills and started empiric antibiotics 1/14. A1c 6.1%. Semglee and SSI given. Transferred out of ICU on 1/16. H and H stable. The patient requires inpatient physical medicine and rehabilitation evaluations and treatment secondary to dysfunction due to  STEMI, CAD s/p CABG.   PMH significant for seizure disorder and is status post gamma knife therapy for a benign epidermoid right cyst in 2019.Patient transferred to CIR on 11/23/2022 .    Patient currently requires mod with basic self-care skills and min A for basic mobility secondary to muscle weakness, decreased cardiorespiratoy endurance, and decreased standing balance and decreased balance strategies.  Prior to hospitalization, patient could complete ADL with  mod I to supervison  .  Patient will benefit from skilled intervention to decrease level of assist with basic self-care skills and increase independence with basic self-care skills prior to discharge home with care partner.  Anticipate patient will require intermittent supervision and follow up home health.  OT - End of Session Activity Tolerance: Tolerates 10 - 20 min activity with multiple rests Endurance Deficit: Yes Endurance Deficit Description: just weaned off O2; needs frequent rest breaks; reports feeling more SOB with activity dyspena 2/4 OT Assessment Rehab Potential (ACUTE ONLY): Good OT Patient demonstrates impairments in the following area(s): Balance;Endurance;Motor OT Basic ADL's Functional Problem(s): Bathing;Dressing;Toileting OT Transfers Functional Problem(s): Toilet;Tub/Shower OT Additional Impairment(s): None OT Plan OT Intensity: Minimum of 1-2 x/day, 45 to 90 minutes OT Frequency: 5 out of 7 days OT Duration/Estimated Length of Stay: ~7-10 days OT Treatment/Interventions: Balance/vestibular training;Disease mangement/prevention;Neuromuscular re-education;Self Care/advanced ADL retraining;Therapeutic Exercise;DME/adaptive equipment instruction;Pain management;Skin care/wound managment;UE/LE Strength taining/ROM;Community reintegration;Patient/family education;UE/LE Coordination activities;Discharge planning;Functional mobility training;Psychosocial support;Therapeutic Activities OT Self Feeding Anticipated  Outcome(s): n/a OT Basic Self-Care Anticipated Outcome(s): supervision OT Toileting Anticipated Outcome(s): mod I OT Bathroom Transfers Anticipated Outcome(s): mod I OT Recommendation Patient destination: Home Follow Up Recommendations: Home health OT Equipment Recommended: Tub/shower bench Equipment Details: bari - white rubbermaid one   OT Evaluation Precautions/Restrictions  Precautions Precautions: Fall;Sternal;Other (comment) Precaution Comments: h/o partial petit mal seizures (per chart, pt typically knows when they are about to occur) General Chart Reviewed: Yes Family/Caregiver Present: No Vital Signs   Pain Pain Assessment Pain Scale: 0-10 Pain Score: 0-No pain Home Living/Prior Functioning Home Living Available Help at Discharge: Family, Available 24 hours/day Type of Home: House Home Access: Stairs to enter Technical brewer of Steps: 3 Home Layout: One level Bathroom Shower/Tub: Chiropodist: Handicapped height  Lives With: Significant other Vision Baseline Vision/History: 1 Wears glasses Ability to See in Adequate Light: 0 Adequate Patient Visual Report: No change from baseline Vision Assessment?: No apparent visual deficits Perception  Perception: Within Functional Limits Praxis Praxis: Intact Cognition Cognition Overall Cognitive Status: Within Functional Limits for tasks assessed Arousal/Alertness: Awake/alert Orientation Level: Person;Place;Situation Person: Oriented Place: Oriented Situation: Oriented Memory: Appears intact Attention: Focused;Sustained;Selective Focused Attention: Appears intact Sustained Attention: Appears intact Selective Attention: Appears intact Awareness: Appears intact Problem Solving: Appears intact Safety/Judgment: Appears intact Brief Interview for Mental Status (BIMS) Repetition of Three Words (First Attempt): 3 Temporal Orientation: Year: Correct Temporal Orientation: Month: Accurate  within 5 days Temporal Orientation: Day: Correct Recall: "Sock": Yes, no cue required Recall: "Blue": Yes, no cue required Recall: "Bed": Yes, after cueing ("  a piece of furniture") BIMS Summary Score: 14 Sensation Sensation Light Touch: Appears Intact Hot/Cold: Appears Intact Proprioception: Appears Intact Coordination Gross Motor Movements are Fluid and Coordinated: Yes Fine Motor Movements are Fluid and Coordinated: Yes Motor  Motor Motor: Within Functional Limits Motor - Skilled Clinical Observations: generalized weakness and fatigue  Trunk/Postural Assessment  Cervical Assessment Cervical Assessment: Within Functional Limits Thoracic Assessment Thoracic Assessment: Within Functional Limits (forward flexed) Lumbar Assessment Lumbar Assessment: Within Functional Limits Postural Control Postural Control: Within Functional Limits  Balance Balance Balance Assessed: Yes Dynamic Sitting Balance Dynamic Sitting - Level of Assistance: 5: Stand by assistance Static Standing Balance Static Standing - Level of Assistance: 5: Stand by assistance Dynamic Standing Balance Dynamic Standing - Balance Support: No upper extremity supported Dynamic Standing - Level of Assistance: 4: Min assist Extremity/Trunk Assessment RUE Assessment RUE Assessment: Within Functional Limits LUE Assessment LUE Assessment: Within Functional Limits  Care Tool Care Tool Self Care Eating   Eating Assist Level: Independent    Oral Care    Oral Care Assist Level: Set up assist    Bathing   Body parts bathed by patient: Right arm;Left arm;Chest;Right upper leg;Left upper leg;Face Body parts bathed by helper: Abdomen;Front perineal area;Buttocks;Right lower leg;Left lower leg   Assist Level: Moderate Assistance - Patient 50 - 74%    Upper Body Dressing(including orthotics)   What is the patient wearing?: [place] only   Assist Level: Supervision/Verbal cueing    Lower Body Dressing  (excluding footwear)   What is the patient wearing?: Underwear/pull up;Pants Assist for lower body dressing: Minimal Assistance - Patient > 75%    Putting on/Taking off footwear   What is the patient wearing?: Non-skid slipper socks Assist for footwear: Total Assistance - Patient < 25%       Care Tool Toileting Toileting activity Toileting Activity did not occur (Clothing management and hygiene only): N/A (no void or bm)       Care Tool Bed Mobility Roll left and right activity        Sit to lying activity        Lying to sitting on side of bed activity   Lying to sitting on side of bed assist level: the ability to move from lying on the back to sitting on the side of the bed with no back support.: Contact Guard/Touching assist     Care Tool Transfers Sit to stand transfer   Sit to stand assist level: Minimal Assistance - Patient > 75%    Chair/bed transfer   Chair/bed transfer assist level: Minimal Assistance - Patient > 75%     Toilet transfer   Assist Level: Minimal Assistance - Patient > 75%     Care Tool Cognition  Expression of Ideas and Wants Expression of Ideas and Wants: 4. Without difficulty (complex and basic) - expresses complex messages without difficulty and with speech that is clear and easy to understand  Understanding Verbal and Non-Verbal Content Understanding Verbal and Non-Verbal Content: 4. Understands (complex and basic) - clear comprehension without cues or repetitions   Memory/Recall Ability Memory/Recall Ability : Current season;Location of own room;Staff names and faces   Refer to Care Plan for [place] 1 OT Short Term Goal 1 (Week 1): LTG=STG Supervision / setup with ADLs and mod I for transfers  Recommendations for other services: None    Skilled Therapeutic Intervention Pt received in the bed when arrived. Ot eval initiated with OT goals,  purpose and role discussed. Pt able to get to EOB maintaining his  sternal precautions without cues with contact guard/ steadying A. Pt performed sit to stand from EOB at height of his one at home with supervision. Contact guard to ambulate to the bathroom to the elevated tub bench to shower. Pt reports after he already doff clothes that he likes to shower but at home he has to stand cause he doesn't have a seat he can use. Pt also reports sometimes has a seizure in the shower. He is unclear why this happens in there sometimes.  Discussed the option of a tub bench (wide rubbermaid one - is the one we used today). Pt reports he needs help for LB at home. Pt donned underwear and pants with min A and socks with total A. Gown for [employer] today. Pt ambulated out of the bari recliner to rest with LEs elevated. TP reports fatigue with activity compared to before sx.  Left resting in the chair in prep for lunch    ADL ADL Grooming: Setup Where Assessed-Grooming: Chair Upper Body Bathing: Minimal assistance Where Assessed-Upper Body Bathing: Shower Lower Body Bathing: Maximal assistance Where Assessed-Lower Body Bathing: Shower Upper Body Dressing: Setup Where Assessed-Upper Body Dressing: Chair Lower Body Dressing: Minimal assistance Where Assessed-Lower Body Dressing: Chair Walk-In Shower Transfer: Curator Method: Heritage manager: Civil engineer, contracting with Manufacturing systems engineer Sit to Stand: Contact Guard/Touching assist Stand to Sit: Contact Guard/Touching assist   Discharge Criteria: Patient will be discharged from OT if patient refuses treatment 3 consecutive times without medical reason, if treatment goals not met, if there is a change in medical status, if patient makes no progress towards goals or if patient is discharged from hospital.  The above assessment, treatment plan, treatment alternatives and goals were discussed and mutually agreed upon: by patient  Nicoletta Ba 11/24/2022, 12:31 PM

## 2022-11-24 NOTE — Progress Notes (Signed)
Pt has home CPAP at bedside and already on it upon my arrival. No assistance needed at this time.

## 2022-11-24 NOTE — Progress Notes (Signed)
Inpatient Rehabilitation Admission Medication Review by a Pharmacist  A complete drug regimen review was completed for this patient to identify any potential clinically significant medication issues.  High Risk Drug Classes Is patient taking? Indication by Medication  Antipsychotic Yes prochlorperazine - PRN nausea/vomiting  Anticoagulant Yes enoxaparin - VTE ppx  Antibiotic No   Opioid Yes Percocet - PRN pain   Antiplatelet Yes Aspirin, clopidogrel - CAD  Hypoglycemics/insulin No   Vasoactive Medication Yes furosemide - edema metoprolol - HTN  Chemotherapy No   Other Yes citalopram - mood perampanel, clonazepam, lamotrigine, kevetiracetam - seizure ppx pantoprazole - GERD ppx Rosuvastatin - HLD     Type of Medication Issue Identified Description of Issue Recommendation(s)  Drug Interaction(s) (clinically significant)     Duplicate Therapy     Allergy     No Medication Administration End Date     Incorrect Dose     Additional Drug Therapy Needed     Significant med changes from prior encounter (inform family/care partners about these prior to discharge). Melodie Bouillon non-formulary (not reordered during admission) Communicate medication changes with patient/family at discharge  Other       Clinically significant medication issues were identified that warrant physician communication and completion of prescribed/recommended actions by midnight of the next day:  No   Time spent performing this drug regimen review (minutes): 20   Thank you for allowing pharmacy to be a part of this patient's care.  Francena Hanly, PharmD Pharmacy Resident  11/24/2022 1:08 PM

## 2022-11-25 DIAGNOSIS — G4700 Insomnia, unspecified: Secondary | ICD-10-CM | POA: Diagnosis not present

## 2022-11-25 DIAGNOSIS — R5381 Other malaise: Secondary | ICD-10-CM | POA: Diagnosis not present

## 2022-11-25 DIAGNOSIS — K5901 Slow transit constipation: Secondary | ICD-10-CM | POA: Diagnosis not present

## 2022-11-25 MED ORDER — ENOXAPARIN SODIUM 80 MG/0.8ML IJ SOSY
80.0000 mg | PREFILLED_SYRINGE | INTRAMUSCULAR | Status: DC
  Administered 2022-11-25 – 2022-11-29 (×5): 80 mg via SUBCUTANEOUS
  Filled 2022-11-25 (×6): qty 0.8

## 2022-11-25 NOTE — Plan of Care (Signed)
  Problem: RH Balance Goal: LTG Patient will maintain dynamic standing balance (PT) Description: LTG:  Patient will maintain dynamic standing balance with assistance during mobility activities (PT) Flowsheets (Taken 11/24/2022 1327) LTG: Pt will maintain dynamic standing balance during mobility activities with:: Independent with assistive device    Problem: Sit to Stand Goal: LTG:  Patient will perform sit to stand with assistance level (PT) Description: LTG:  Patient will perform sit to stand with assistance level (PT) Flowsheets (Taken 11/24/2022 1327) LTG: PT will perform sit to stand in preparation for functional mobility with assistance level: Independent with assistive device   Problem: RH Bed Mobility Goal: LTG Patient will perform bed mobility with assist (PT) Description: LTG: Patient will perform bed mobility with assistance, with/without cues (PT). Flowsheets (Taken 11/24/2022 1327) LTG: Pt will perform bed mobility with assistance level of: Contact Guard/Touching assist   Problem: RH Bed to Chair Transfers Goal: LTG Patient will perform bed/chair transfers w/assist (PT) Description: LTG: Patient will perform bed to chair transfers with assistance (PT). Flowsheets (Taken 11/24/2022 1327) LTG: Pt will perform Bed to Chair Transfers with assistance level: Supervision/Verbal cueing   Problem: RH Car Transfers Goal: LTG Patient will perform car transfers with assist (PT) Description: LTG: Patient will perform car transfers with assistance (PT). Flowsheets (Taken 11/24/2022 1327) LTG: Pt will perform car transfers with assist:: Supervision/Verbal cueing   Problem: RH Furniture Transfers Goal: LTG Patient will perform furniture transfers w/assist (OT/PT) Description: LTG: Patient will perform furniture transfers  with assistance (OT/PT). Flowsheets (Taken 11/24/2022 1327) LTG: Pt will perform furniture transfers with assist:: Supervision/Verbal cueing   Problem: RH  Ambulation Goal: LTG Patient will ambulate in home environment (PT) Description: LTG: Patient will ambulate in home environment, # of feet with assistance (PT). Flowsheets (Taken 11/24/2022 1327) LTG: Pt will ambulate in home environ  assist needed:: Independent with assistive device LTG: Ambulation distance in home environment: up to 50 ft sing LRAD Goal: LTG Patient will ambulate in community environment (PT) Description: LTG: Patient will ambulate in community environment, # of feet with assistance (PT). Flowsheets (Taken 11/24/2022 1327) LTG: Pt will ambulate in community environ  assist needed:: Supervision/Verbal cueing LTG: Ambulation distance in community environment: at least 200 ft using LRAD   Problem: RH Stairs Goal: LTG Patient will ambulate up and down stairs w/assist (PT) Description: LTG: Patient will ambulate up and down # of stairs with assistance (PT) Flowsheets (Taken 11/24/2022 1327) LTG: Pt will ambulate up/down stairs assist needed:: Supervision/Verbal cueing LTG: Pt will  ambulate up and down number of stairs: at least 3 steps with LHR to ascend as per home environment in order to enter/ exit home

## 2022-11-25 NOTE — Progress Notes (Signed)
Pt has a home CPAP set up and ready at bedside. No assistance needed at this time.

## 2022-11-25 NOTE — Progress Notes (Signed)
During rounds with day shift nurse this pt and his wife at bedside reported that he had a "silent seizure". Pt was alert and oriented with no symptoms but complaining of a headache. On call provider Dr. Letta Pate was called and informed. See vitals per MAR. Prn Oxy given for severe headache. Pt in bed resting, call bell in reach, no further concerns at this time.

## 2022-11-25 NOTE — Progress Notes (Signed)
PROGRESS NOTE   Subjective/Complaints: Slept poorly again but didn't ask for his Melatonin-- reminded him he can ask for this. Had a BM yesterday, pain doing ok, denies any current complaints.    ROS: +migraines (chronic). +insomnia. +constipation-improved. Denies fevers, chills, CP, SOB, abd pain, N/V/D, new/worsening numbness, tingling, focal weakness, or any other complaints at this time.   Objective:   No results found. Recent Labs    11/24/22 0949  WBC 9.4  HGB 13.7  HCT 39.8  PLT 266   Recent Labs    11/23/22 0221 11/24/22 0949  NA 134* 132*  K 4.4 4.4  CL 95* 95*  CO2 28 28  GLUCOSE 127* 134*  BUN 17 14  CREATININE 1.03 1.03  CALCIUM 9.2 9.1     Intake/Output Summary (Last 24 hours) at 11/25/2022 2395 Last data filed at 11/24/2022 1804 Gross per 24 hour  Intake 1680 ml  Output --  Net 1680 ml         Physical Exam: Vital Signs Blood pressure 108/63, pulse 65, temperature 98.1 F (36.7 C), temperature source Oral, resp. rate 18, height 6' (1.829 m), weight (!) 164 kg, SpO2 98 %.  Constitutional:      General: He is not in acute distress.    Appearance: He is obese.  HENT: [place]/AT, MMM Eyes: EOMI, PERRL Cardiovascular: RRR, nl s1/s2, no m/r/g Pulmonary: CTAB, no w/r/r, normal effort, no distress Abdominal: soft, obese but nondistended, nonTTP, no r/g/r, +BS throughout Musculoskeletal:        General: No swelling. Normal range of motion.     Cervical back: Normal range of motion.     Comments: Trace LE edema  Skin:    General: Skin is warm and dry.     Comments: Chest incision CDI. Pressure mark on bridge of nose from cpap mask Neurological:     Mental Status: He is alert.     Comments: Alert and oriented x 3. Normal insight and awareness. Intact Memory. Normal language and speech. Cranial nerve exam unremarkable. UE motor 4+ to 5/5. LE 4/5 prox to 5/5 distal. Sensory exam normal for light  touch and pain in all 4 limbs. No limb ataxia or cerebellar signs. No abnormal tone appreciated.    Psychiatric:        Mood and Affect: Mood normal.        Behavior: Behavior normal.    Assessment/Plan: 1. Functional deficits which require 3+ hours per day of interdisciplinary therapy in a comprehensive inpatient rehab setting. Physiatrist is providing close team supervision and 24 hour management of active medical problems listed below. Physiatrist and rehab team continue to assess barriers to discharge/monitor patient progress toward functional and medical goals  Care Tool:  Bathing    Body parts bathed by patient: Right arm, Left arm, Chest, Right upper leg, Left upper leg, Face   Body parts bathed by helper: Abdomen, Front perineal area, Buttocks, Right lower leg, Left lower leg     Bathing assist Assist Level: Moderate Assistance - Patient 50 - 74%     Upper Body Dressing/Undressing Upper body dressing   What is the patient wearing?: [place] only  Upper body assist Assist Level: Supervision/Verbal cueing    Lower Body Dressing/Undressing Lower body dressing      What is the patient wearing?: Underwear/pull up, Pants     Lower body assist Assist for lower body dressing: Minimal Assistance - Patient > 75%     Toileting Toileting Toileting Activity did not occur (Clothing management and hygiene only): N/A (no void or bm)  Toileting assist       Transfers Chair/bed transfer  Transfers assist     Chair/bed transfer assist level: Minimal Assistance - Patient > 75%     Locomotion Ambulation   Ambulation assist      Assist level: Contact Guard/Touching assist Assistive device: Walker-rolling Max distance: 135 ft   Walk 10 feet activity   Assist     Assist level: Contact Guard/Touching assist Assistive device: No Device   Walk 50 feet activity   Assist    Assist level: Contact Guard/Touching assist Assistive device: Walker-rolling     Walk 150 feet activity   Assist    Assist level: Contact Guard/Touching assist Assistive device: Walker-rolling    Walk 10 feet on uneven surface  activity   Assist Walk 10 feet on uneven surfaces activity did not occur: Safety/medical concerns         Wheelchair     Assist Is the patient using a wheelchair?: Yes Type of Wheelchair: Manual    Wheelchair assist level: Dependent - Patient 0% Max wheelchair distance: 200 ft    Wheelchair 50 feet with 2 turns activity    Assist        Assist Level: Dependent - Patient 0%   Wheelchair 150 feet activity     Assist      Assist Level: Dependent - Patient 0%   Blood pressure 108/63, pulse 65, temperature 98.1 F (36.7 C), temperature source Oral, resp. rate 18, height 6' (1.829 m), weight (!) 164 kg, SpO2 98 %.  Medical Problem List and Plan: 1. Functional deficits secondary to debility after MI/CABG and associated medical complications             -patient may  shower             -ELOS/Goals: 10-12 days, supervision goals with PT, OT 2.  Antithrombotics: -DVT/anticoagulation:  Pharmaceutical: Lovenox '40mg'$  QD             -antiplatelet therapy: Plavix '75mg'$  QD and aspirin 81 mg daily   3. Pain Management: Tylenol and oxycodone PRN   4. Mood/Behavior/Sleep: LCSW to evaluate and provide emotional support             -anxiety disorder: Continue Celexa 40 mg and Klonopin 0.5 mg BID             -antipsychotic agents: n/a -11/24/22 ordered Melatonin 5-'10mg'$  QHS PRN for sleep/insomnia--reminded him he can ask for this   5. Neuropsych/cognition: This patient is capable of making decisions on his own behalf.   6. Skin/Wound Care: Routine skin care checks              7. Fluids/Electrolytes/Nutrition: routine Is and Os and follow-up chemistries (weekly BMP starting 11/26/22) -11/24/22 Na 132, Cl 95 fairly stable; Albumin low 2.9, AST 49/ALT 48 mildly elevated--likely transient transaminitis vs fatty liver--  consider recheck of LFTs this week   9: GERD: continue Protonix 80 mg    10: Migraine headache: worse since hospitalization -at home uses tylenol or Roxicet 5/325 depending upon severity -uses Ubrelvy as needed as well with  middling results--not on formulary per patient             -Ajovy monthly  -11/24/22 currently ok, has tylenol/roxicodone/percocet PRNs   11: CAD/ICM/STEMI: s/p CABG times one vessel 11/13/22, POD #12             -continue sternal precautions   12: OSA on CPAP             -continue HS with oxygen  13: Volume overload: continue Lasix 40 mg daily; follow-up BMP             -daily weights             -Lower extremity edema improved -11/24/22 mild hyponatremia/hypochloremia but stable (Na 132, Cl 95); monitor on weekly labs -11/25/22 weights stable/improving, continue to monitor Filed Weights   11/23/22 1603 11/24/22 0500 11/25/22 0529  Weight: (!) 165.1 kg (!) 164.1 kg (!) 164 kg    14: Pneumonia, possible: finished antibiotics, no fever or cough             -O2 via [place] prn, FV   15: Class 3 obesity: BMI = 49.42, diet counseling would be beneficial   16: Seizure disorder:             -Klonopin 0.25-0.5 mg BID             -Lamictal 200 mg q AM and q HS             -Keppra XR 1000 mg BID             -Fycompa 8 mg daily   17: Hyperlipidemia: Crestor 40 mg daily   18: Tobacco use: cessation counseling   19: Hypertension: monitor BP TID and prn             -continue metoprolol 25 mg BID             -home lisinopril not restarted   -11/25/22 BPs soft/normal, continue holding lisinopril, monitor Vitals:   11/23/22 1603 11/23/22 1950 11/24/22 0406 11/24/22 1329  BP: (!) 107/51 (!) 104/56 110/70 (!) 100/54   11/24/22 1958 11/25/22 0340  BP: 113/60 108/63      20: Left hip pain/DJD: surgery pending weight loss; follow-up outpatient   21: Pre-diabetes: Hgb A1C 6.1 on 11/10/22, no meds currently  22: Constipation: current regimen Colace '200mg'$  QD, MoM PRN,  Sorbitol PRN, Fleet PRN  -11/24/22 LBM 2 days ago, ordered Miralax 17g BID, monitor for effect  -11/25/22 BM yesterday, continue current regimen  LOS: 2 days A FACE TO [place] 11/25/2022, 7:02 AM

## 2022-11-26 DIAGNOSIS — R600 Localized edema: Secondary | ICD-10-CM | POA: Diagnosis not present

## 2022-11-26 DIAGNOSIS — R5381 Other malaise: Secondary | ICD-10-CM | POA: Diagnosis not present

## 2022-11-26 DIAGNOSIS — I10 Essential (primary) hypertension: Secondary | ICD-10-CM

## 2022-11-26 LAB — CBC
HCT: 40.9 % (ref 39.0–52.0)
Hemoglobin: 13.7 g/dL (ref 13.0–17.0)
MCH: 31.2 pg (ref 26.0–34.0)
MCHC: 33.5 g/dL (ref 30.0–36.0)
MCV: 93.2 fL (ref 80.0–100.0)
Platelets: 287 10*3/uL (ref 150–400)
RBC: 4.39 MIL/uL (ref 4.22–5.81)
RDW: 14.3 % (ref 11.5–15.5)
WBC: 8.6 10*3/uL (ref 4.0–10.5)
nRBC: 0 % (ref 0.0–0.2)

## 2022-11-26 LAB — BASIC METABOLIC PANEL
Anion gap: 7 (ref 5–15)
BUN: 10 mg/dL (ref 6–20)
CO2: 28 mmol/L (ref 22–32)
Calcium: 9 mg/dL (ref 8.9–10.3)
Chloride: 97 mmol/L — ABNORMAL LOW (ref 98–111)
Creatinine, Ser: 1.19 mg/dL (ref 0.61–1.24)
GFR, Estimated: >60 mL/min (ref >60)
Glucose, Bld: 122 mg/dL — ABNORMAL HIGH (ref 70–99)
Potassium: 4.1 mmol/L (ref 3.5–5.1)
Sodium: 132 mmol/L — ABNORMAL LOW (ref 135–145)

## 2022-11-26 NOTE — Progress Notes (Signed)
Physical Therapy Session Note  Patient Details  Name: James Torres MRN: [id number] Date of Birth: Jul 04, 1966  Today's Date: 11/26/2022 PT Individual Time: 0800-0856 PT Individual Time Calculation (min): 56 min   Short Term Goals: Week 1:  PT Short Term Goal 1 (Week 1): STG = LTG d/t ELOS  Skilled Therapeutic Interventions/Progress Updates:      Pt resting in bed to start, has his CPAP on. Awakens to voice and is agreeable to PT tx. Needs ++ time to fully waken, reports 9/10 headache, reports chronic migraines. RN made aware who provided medication during therapy session.   Bed mobility completed with supervision with HOB elevated, use of bed rails - cues for sternal px and pt compliant throughout. Good sitting balance at EOB without LOB and proper trunk control. Pt reporting need to void. Sit<>stand to bari RW with supervision and ambulates with SBA and RW into bathroom - min cues for safety while navigating threshold and while sitting to low toilet. Pt continent of bladder void while sitting. Pt needing light minA for powering to rise from low sitting toilet - may benefit from commode to elevate. Returned to bed and patient completed LB and UB dressing with setupA but needing a few rest breaks (fatigue) and extra time. CGA for standing balance while he pulls pants over hips in unsupported standing.  Stand<>pivot transfer with CGA and RW to w/c - cues for safety approach and sternal px.  Transported in w/c to main rehab gym for energy conservation. Provided him with sunglassess to reduce photophobia contributing to his migraine. Pt appreciative.   Gait training in rehab hallways, ~294f, with CGA and RW (CGA for holding pants from falling). Distance limited by fatigue, no significant gait deficits other than decreased speed and mild lateral instability with turning.  Stair training using 6inch steps and 2 hand rails. Step-to pattern while forward facing. CGA for ascent and close SBA for  descent. No knee buckling or LOB observed, cautious and careful negotiation.  Returned to his room and assisted to recliner with ambulatory transfer using RW with supervision. Concluded session seated in recliner with call bell in lap and all personal items within reach. MD entering room at end of session.     Therapy Documentation Precautions:  Precautions Precautions: Fall, Sternal, Other (comment) Precaution Comments: h/o partial petit mal seizures (per chart, pt typically knows when they are about to occur) Restrictions Weight Bearing Restrictions:  (sternal precaution) RUE Weight Bearing: Partial weight bearing LUE Weight Bearing: Partial weight bearing General:    Therapy/Group: Individual Therapy  Jamiaya Bina P Audria Takeshita PT 11/26/2022, 7:26 AM

## 2022-11-26 NOTE — Progress Notes (Signed)
Pt self administers home CPAP. 

## 2022-11-26 NOTE — Progress Notes (Signed)
Inpatient Rehabilitation  Patient information reviewed and entered into eRehab system by Menachem Urbanek M. Drexler Maland, M.A., CCC/SLP, PPS Coordinator.  Information including medical coding, functional ability and quality indicators will be reviewed and updated through discharge.    

## 2022-11-26 NOTE — Progress Notes (Signed)
Occupational Therapy Session Note  Patient Details  Name: James Torres MRN: [id number] Date of Birth: Jul 16, 1966  Session 1 Today's Date: 11/26/2022 OT Individual Time: 1035-1130 OT Individual Time Calculation (min): 55 min    Session 2 Today's Date: 11/26/2022 OT Individual Time: 8299-3716 OT Individual Time Calculation (min): 43 min    Short Term Goals: Week 1:  OT Short Term Goal 1 (Week 1): LTG=STG Supervision / setup with ADLs and mod I for transfers  Skilled Therapeutic Interventions/Progress Updates:    Session 1 Pt sitting in recliner with migraine still present, adjusted environment to low stimuli and kept voice volume low to assist with pain management. He was agreeable to take shower. Discussed home set up and shower at length, pt providing pictures and home measurements. Provided recommendations on shower chair options. He completed sit > stand from recliner with good adherence to sternal precautions with CGA. Functional mobility into the bathroom with the RW with CGA. Toileting tasks completed with CGA overall. He transferred into the shower with CGA and completed all UB bathing with (S) seated. He completed LB bathing with min A to wash under abdomen to wash anterior peri areas. He transferred back to the recliner with CGA using the RW. He donned a shirt with (S), pants with (S). Assist required to don powder and dry under abdomen. He donned socks and shoes with (S). He then completed 100 ft of functional mobility to the therapy gym with CGA using the RW. Skilled monitoring of vitals throughout session to ensure hemodynamic and cardiorespiratory stability- all VSS. SpO2 >95 each time. He returned to his room and was left sitting up with all needs met.    Session 2 Pt received sitting in the recliner with no c/o pain, agreeable to OT session. He completed 120 ft of functional mobility to the therapy gym with CGA. He completed 3x sit <> stands from the EOM with CGA overall-  holding a 2 lb medicine ball at his chest level. He required an extended rest break between each set. He completed standing level hip abduction, hip adduction, and hip extension 2x10 repetitions with a seated rest break between. Intended carryover for posterior chain strengthening to improve upright posture during ADL transfers. He returned to his room, 100 ft of functional mobility with the RW at (S) level. He was left sitting up in the recliner with all needs met.    Therapy Documentation Precautions:  Precautions Precautions: Fall, Sternal, Other (comment) Precaution Comments: h/o partial petit mal seizures (per chart, pt typically knows when they are about to occur) Restrictions Weight Bearing Restrictions:  (sternal precaution) RUE Weight Bearing: Partial weight bearing LUE Weight Bearing: Partial weight bearing  Therapy/Group: Individual Therapy  [name] 11/26/2022, 6:22 AM

## 2022-11-26 NOTE — IPOC Note (Signed)
Overall Plan of Care [facility]) Patient Details Name: James Torres MRN: [id number] DOB: April 20, 1966  Admitting Diagnosis: Debility  Hospital Problems: Principal Problem:   Debility     Functional Problem List: Nursing Bowel, Medication Management, Pain, Endurance, Skin Integrity  PT Balance, Edema, Endurance, Motor, Nutrition, Safety, Skin Integrity  OT Balance, Endurance, Motor  SLP    TR         Basic ADL's: OT Bathing, Dressing, Toileting     Advanced  ADL's: OT       Transfers: PT Bed Mobility, Bed to Chair, Car, Manufacturing systems engineer, Metallurgist: PT Stairs     Additional Impairments: OT None  SLP        TR      Anticipated Outcomes Item Anticipated Outcome  Self Feeding n/a  Swallowing      Basic self-care  supervision  Toileting  mod I   Bathroom Transfers mod I  Bowel/Bladder  manage bowel w mod I assist and bladder w toileting  Transfers  Mod Ind  Locomotion  Supervision  Communication     Cognition     Pain  < 4 with prns  Safety/Judgment  manage w cues   Therapy Plan: PT Intensity: Minimum of 1-2 x/day ,45 to 90 minutes PT Frequency: 5 out of 7 days PT Duration Estimated Length of Stay: 7-10 days OT Intensity: Minimum of 1-2 x/day, 45 to 90 minutes OT Frequency: 5 out of 7 days OT Duration/Estimated Length of Stay: ~7-10 days     Team Interventions: Nursing Interventions Disease Management/Prevention, Patient/Family Education, Pain Management, Medication Management, Discharge Planning, Skin Care/Wound Management  PT interventions Ambulation/gait training, Community reintegration, DME/adaptive equipment instruction, Neuromuscular re-education, Psychosocial support, Stair training, UE/LE Strength taining/ROM, Training and development officer, Discharge planning, Functional electrical stimulation, Pain management, Skin care/wound management, Therapeutic Activities, UE/LE Coordination activities, Disease management/prevention,  Functional mobility training, Patient/family education, Therapeutic Exercise, Visual/perceptual remediation/compensation  OT Interventions Balance/vestibular training, Disease mangement/prevention, Neuromuscular re-education, Self Care/advanced ADL retraining, Therapeutic Exercise, DME/adaptive equipment instruction, Pain management, Skin care/wound managment, UE/LE Strength taining/ROM, Community reintegration, Barrister's clerk education, UE/LE Coordination activities, Discharge planning, Functional mobility training, Psychosocial support, Therapeutic Activities  SLP Interventions    TR Interventions    SW/CM Interventions Discharge Planning, Psychosocial Support, Patient/Family Education   Barriers to Discharge MD  Medical stability and Home enviroment access/loayout  Nursing Decreased caregiver support, Home environment access/layout 1 level  3ste left rail with S.O.;walks without AD, fall 3 weeks ago, near falls , assisted for LB bathing and dressing, s/o does IADLs. Pt mostly sponge bathes at sink  PT Inaccessible home environment, Home environment access/layout, Wound Care, Insurance for SNF coverage, Weight, Nutrition means    OT      SLP      SW Insurance for SNF coverage     Team Discharge Planning: Destination: PT-Home ,OT- Home , SLP-  Projected Follow-up: PT-Home health PT, 24 hour supervision/assistance, OT-  Home health OT, SLP-  Projected Equipment Needs: PT-To be determined, OT- Tub/shower bench, SLP-  Equipment Details: PT- , OT-bari - white rubbermaid one Patient/family involved in discharge planning: PT- Patient,  OT-Patient, SLP-   MD ELOS: 10-12 Medical Rehab Prognosis:  Excellent Assessment: The patient has been admitted for CIR therapies with the diagnosis of debility after MI/CABG and associated medical complications . The team will be addressing functional mobility, strength, stamina, balance, safety, adaptive techniques and equipment, self-care, bowel and bladder  mgt, patient and caregiver education. Goals have  been set at supervision. Anticipated discharge destination is home.        See Team Conference Notes for weekly updates to the plan of care

## 2022-11-26 NOTE — Progress Notes (Signed)
Patient ID: James Torres, male   DOB: 1966-02-09, 57 y.o.   MRN: [id number] Met with the patient to review current situation, rehab process, team conference and plan of care. Discussed secondary risks including cardiomyopathy, CAD, HTN, prediabetes, diet modification recommendations and medications. Patient noted several medications he was not familiar with; Lovenox, Lopressor and asking about going home on insulin since he was not on insulin PTA; currently on semglee. Chest incision healing well, little sore. Bigger issue is migraine; uses Tylenol EX (3) or percocet PTA. Continue to follow along to address educational needs to facilitate preparation for discharge. James Torres

## 2022-11-26 NOTE — Progress Notes (Signed)
Physical Therapy Session Note  Patient Details  Name: [name] MRN: [id number] Date of Birth: 02-Aug-1966  Today's Date: 11/26/2022 PT Individual Time: 1445-1530 PT Individual Time Calculation (min): 45 min   Short Term Goals: Week 1:  PT Short Term Goal 1 (Week 1): STG = LTG d/t ELOS  Skilled Therapeutic Interventions/Progress Updates:      Therapy Documentation Precautions:  Precautions Precautions: Fall, Sternal, Other (comment) Precaution Comments: h/o partial petit mal seizures (per chart, pt typically knows when they are about to occur) Restrictions Weight Bearing Restrictions:  (sternal precaution) RUE Weight Bearing: Partial weight bearing LUE Weight Bearing: Partial weight bearing  Pt agreeable to PT session with emphasis on LE strength training. Pt with unrated left hip pain, pre-medicated and provided with rest breaks for relief.   Pt ambulated ~150 ft with HDRW close (S) to main gym with verbal cues to reduce RW grip. Pt performed following exercises supine to target gluteal muscles:   -standard gluteal bridges 3 x 10   -green theraband resisted gluteal bridges 3 x 10 (glute medius)  Pt ambulated to room and left seated in recliner at bedside with all needs in reach.      Therapy/Group: Individual Therapy  Verl Dicker Verl Dicker PT, DPT  11/26/2022, 7:57 AM

## 2022-11-26 NOTE — Progress Notes (Signed)
PROGRESS NOTE   Subjective/Complaints: Reports migraines under control with current medications. No new concerns today.    ROS: +migraines (chronic). +insomnia. +constipation-improved. Denies fevers, chills, CP, SOB, abd pain, N/V/D, vision changes, new/worsening numbness, tingling, focal weakness, or any other complaints at this time.   Objective:   No results found. Recent Labs    11/24/22 0949 11/26/22 0530  WBC 9.4 8.6  HGB 13.7 13.7  HCT 39.8 40.9  PLT 266 287    Recent Labs    11/24/22 0949 11/26/22 0530  NA 132* 132*  K 4.4 4.1  CL 95* 97*  CO2 28 28  GLUCOSE 134* 122*  BUN 14 10  CREATININE 1.03 1.19  CALCIUM 9.1 9.0     Intake/Output Summary (Last 24 hours) at 11/26/2022 0806 Last data filed at 11/26/2022 3329 Gross per 24 hour  Intake --  Output 1750 ml  Net -1750 ml         Physical Exam: Vital Signs Blood pressure 122/69, pulse 70, temperature 98.3 F (36.8 C), temperature source Oral, resp. rate 18, height 6' (1.829 m), weight (!) 165.7 kg, SpO2 100 %.  Constitutional:      General: He is not in acute distress.    Appearance: He is obese.  HENT: [place]/AT, MMM Eyes: conjugate gaze Cardiovascular: RRR, nl s1/s2, no m/r/g Pulmonary: CTAB, no w/r/r, normal effort, no distress Abdominal: soft, obese but nondistended, nonTTP, no r/g/r, +BS throughout Musculoskeletal:        General: No swelling. Normal range of motion.     Cervical back: Normal range of motion.     Comments: Trace LE edema  Skin:    General: Skin is warm and dry.     Comments: Chest incision CDI.  Neurological:     Mental Status: He is alert.     Comments: Alert and oriented x 3. Normal insight and awareness. Intact Memory. Normal language and speech. Cranial nerve exam unremarkable. UE motor 4+ to 5/5. LE 4/5 prox to 5/5 distal. Sensory exam normal for light touch and pain in all 4 limbs. No limb ataxia or cerebellar  signs. No abnormal tone appreciated.    Psychiatric:        Mood and Affect: Mood normal.        Behavior: Behavior normal.    Assessment/Plan: 1. Functional deficits which require 3+ hours per day of interdisciplinary therapy in a comprehensive inpatient rehab setting. Physiatrist is providing close team supervision and 24 hour management of active medical problems listed below. Physiatrist and rehab team continue to assess barriers to discharge/monitor patient progress toward functional and medical goals  Care Tool:  Bathing    Body parts bathed by patient: Right arm, Left arm, Chest, Right upper leg, Left upper leg, Face   Body parts bathed by helper: Abdomen, Front perineal area, Buttocks, Right lower leg, Left lower leg     Bathing assist Assist Level: Moderate Assistance - Patient 50 - 74%     Upper Body Dressing/Undressing Upper body dressing   What is the patient wearing?: Hospital gown only    Upper body assist Assist Level: Supervision/Verbal cueing    Lower Body Dressing/Undressing Lower body  dressing      What is the patient wearing?: Underwear/pull up, Pants     Lower body assist Assist for lower body dressing: Minimal Assistance - Patient > 75%     Toileting Toileting Toileting Activity did not occur Landscape architect and hygiene only): N/A (no void or bm)  Toileting assist Assist for toileting: Minimal Assistance - Patient > 75%     Transfers Chair/bed transfer  Transfers assist     Chair/bed transfer assist level: Minimal Assistance - Patient > 75%     Locomotion Ambulation   Ambulation assist      Assist level: Contact Guard/Touching assist Assistive device: Walker-rolling Max distance: 135 ft   Walk 10 feet activity   Assist     Assist level: Contact Guard/Touching assist Assistive device: No Device   Walk 50 feet activity   Assist    Assist level: Contact Guard/Touching assist Assistive device: Walker-rolling     Walk 150 feet activity   Assist    Assist level: Contact Guard/Touching assist Assistive device: Walker-rolling    Walk 10 feet on uneven surface  activity   Assist Walk 10 feet on uneven surfaces activity did not occur: Safety/medical concerns         Wheelchair     Assist Is the patient using a wheelchair?: Yes Type of Wheelchair: Manual    Wheelchair assist level: Dependent - Patient 0% Max wheelchair distance: 200 ft    Wheelchair 50 feet with 2 turns activity    Assist        Assist Level: Dependent - Patient 0%   Wheelchair 150 feet activity     Assist      Assist Level: Dependent - Patient 0%   Blood pressure 122/69, pulse 70, temperature 98.3 F (36.8 C), temperature source Oral, resp. rate 18, height 6' (1.829 m), weight (!) 165.7 kg, SpO2 100 %.  Medical Problem List and Plan: 1. Functional deficits secondary to debility after MI/CABG and associated medical complications             -patient may  shower             -ELOS/Goals: 10-12 days, supervision goals with PT, OT 2.  Antithrombotics: -DVT/anticoagulation:  Pharmaceutical: Lovenox '40mg'$  QD             -antiplatelet therapy: Plavix '75mg'$  QD and aspirin 81 mg daily   3. Pain Management: Tylenol and oxycodone PRN   4. Mood/Behavior/Sleep: LCSW to evaluate and provide emotional support             -anxiety disorder: Continue Celexa 40 mg and Klonopin 0.5 mg BID             -antipsychotic agents: n/a -11/24/22 ordered Melatonin 5-'10mg'$  QHS PRN for sleep/insomnia--reminded him he can ask for this   5. Neuropsych/cognition: This patient is capable of making decisions on his own behalf.   6. Skin/Wound Care: Routine skin care checks              7. Fluids/Electrolytes/Nutrition: routine Is and Os and follow-up chemistries (weekly BMP starting 11/26/22) -11/24/22 Na 132, Cl 95 fairly stable; Albumin low 2.9, AST 49/ALT 48 mildly elevated--likely transient transaminitis vs fatty  liver-- consider recheck of LFTs this week   9: GERD: continue Protonix 80 mg    10: Migraine headache: worse since hospitalization -at home uses tylenol or Roxicet 5/325 depending upon severity -uses Ubrelvy as needed as well with middling results--not on formulary per patient             -  Ajovy monthly  -11/24/22 currently ok, has tylenol/roxicodone/percocet PRNs   11: CAD/ICM/STEMI: s/p CABG times one vessel 11/13/22, POD #12             -continue sternal precautions   12: OSA on CPAP             -continue HS with oxygen  13: Volume overload: continue Lasix 40 mg daily; follow-up BMP             -daily weights             -Lower extremity edema improved -11/24/22 mild hyponatremia/hypochloremia but stable (Na 132, Cl 95); monitor on weekly labs -1/22 weight appears stable, continue to monitor Filed Weights   11/24/22 0500 11/25/22 0529 11/26/22 0431  Weight: (!) 164.1 kg (!) 164 kg (!) 165.7 kg    14: Pneumonia, possible: finished antibiotics, no fever or cough             -O2 via [place] prn, FV   15: Class 3 obesity: BMI = 49.42, diet counseling would be beneficial   16: Seizure disorder:             -Klonopin 0.25-0.5 mg BID             -Lamictal 200 mg q AM and q HS             -Keppra XR 1000 mg BID             -Fycompa 8 mg daily   17: Hyperlipidemia: Crestor 40 mg daily   18: Tobacco use: cessation counseling   19: Hypertension: monitor BP TID and prn             -continue metoprolol 25 mg BID             -home lisinopril not restarted   -BP soft but stable, continue to monitor Vitals:   11/23/22 1603 11/23/22 1950 11/24/22 0406 11/24/22 1329  BP: (!) 107/51 (!) 104/56 110/70 (!) 100/54   11/24/22 1958 11/25/22 0340 11/25/22 1956 11/26/22 0304  BP: 113/60 108/63 (!) 103/38 122/69      20: Left hip pain/DJD: surgery pending weight loss; follow-up outpatient   21: Pre-diabetes: Hgb A1C 6.1 on 11/10/22, no meds currently  22: Constipation: current regimen Colace  '200mg'$  QD, MoM PRN, Sorbitol PRN, Fleet PRN  -11/24/22 LBM 2 days ago, ordered Miralax 17g BID, monitor for effect  -11/25/22 BM yesterday, continue current regimen  LOS: 3 days A FACE TO FACE EVALUATION WAS PERFORMED  James Torres 11/26/2022, 8:06 AM

## 2022-11-26 NOTE — Progress Notes (Signed)
Inpatient [facility] Individual Statement of Services  Patient Name:  James Torres  Date:  11/26/2022  Welcome to the [facility].  Our goal is to provide you with an individualized program based on your diagnosis and situation, designed to meet your specific needs.  With this comprehensive rehabilitation program, you will be expected to participate in at least 3 hours of rehabilitation therapies Monday-Friday, with modified therapy programming on the weekends.  Your rehabilitation program will include the following services:  Physical Therapy (PT), Occupational Therapy (OT), 24 hour per day rehabilitation nursing, Care Coordinator, Rehabilitation Medicine, Nutrition Services, and Pharmacy Services  Weekly team conferences will be held on Wednesday to discuss your progress.  Your Inpatient Rehabilitation Care Coordinator will talk with you frequently to get your input and to update you on team discussions.  Team conferences with you and your family in attendance may also be held.  Expected length of stay: 7-10 days  Overall anticipated outcome: Independent with device  Depending on your progress and recovery, your program may change. Your Inpatient Rehabilitation Care Coordinator will coordinate services and will keep you informed of any changes. Your Inpatient Rehabilitation Care Coordinator's name and contact numbers are listed  below.  The following services may also be recommended but are not provided by the [facility] will be made to provide these services after discharge if needed.  Arrangements include referral to agencies that provide these services.  Your insurance has been verified to be:  [employer] Your primary doctor is:  Lona Kettle  Pertinent information will be shared with your doctor and your insurance  company.  Inpatient Rehabilitation Care Coordinator:  Ovidio Kin, [place] or Emilia Beck  Information discussed with and copy given to patient by: Elease Hashimoto, 11/26/2022, 10:26 AM

## 2022-11-26 NOTE — Progress Notes (Signed)
Inpatient Rehabilitation Care Coordinator Assessment and Plan Patient Details  Name: James Torres MRN: [id number] Date of Birth: 10-01-1966  Today's Date: 11/26/2022  Hospital Problems: Principal Problem:   Debility  Past Medical History:  Past Medical History:  Diagnosis Date   Anxiety    Brain tumor ([place])    CAD (coronary artery disease)    a. 03/2015 NSTEMI: LM nl, LAD 50ost, 100p (2.75x38 Synergy DES), 75d, RI nl, LCX minor irregs, OM1 min irregs, RCA 50ost, EF 35-45%.   Depression    GERD (gastroesophageal reflux disease)    Hyperlipidemia    Ischemic cardiomyopathy    a. 03/2015 EF 35-45% by LV gram @ time of NSTEMI;  b. 03/2015 Echo: EF 50-55%, sev HK to DK of dist an, apical, and infap walls.Gr 1 DD.   Kidney stone    Metabolic syndrome 2/87/8676   Migraines    Morbid obesity ([place])    Pre-diabetes    Seizures ([place])    a. Has auras and metalic taste in mouth.  Last one 06/2014- last a few seconds, has one every couple weeks, Dr Mare Loan is neurologist and is aware.    Sleep apnea 2009   a. On CPAP.   Tobacco abuse    Past Surgical History:  Past Surgical History:  Procedure Laterality Date   BRAIN SURGERY  2004   Crainotomy for tumor   CARDIAC CATHETERIZATION N/A 03/21/2015   Procedure: Left Heart Cath and Coronary Angiography;  Surgeon: Sherren Mocha, MD;  Location: [facility];  Service: Cardiovascular;  Laterality: N/A;   CORONARY ARTERY BYPASS GRAFT N/A 11/13/2022   Procedure: OFF PUMP CORONARY ARTERY BYPASS GRAFTING (CABG) X ONE BYPASS USING LEFT INTERNAL MAMMARY ARTERY.;  Surgeon: Lajuana Matte, MD;  Location: [facility];  Service: Open Heart Surgery;  Laterality: N/A;   KNEE ARTHROSCOPY Right 2005 approx   cartliage   LEFT HEART CATH AND CORONARY ANGIOGRAPHY N/A 11/12/2022   Procedure: LEFT HEART CATH AND CORONARY ANGIOGRAPHY;  Surgeon: Lorretta Harp, MD;  Location: [facility];  Service: Cardiovascular;  Laterality: N/A;   TEE WITHOUT  CARDIOVERSION N/A 11/13/2022   Procedure: TRANSESOPHAGEAL ECHOCARDIOGRAM (TEE);  Surgeon: Lajuana Matte, MD;  Location: [facility];  Service: Open Heart Surgery;  Laterality: N/A;   ULNAR NERVE TRANSPOSITION Left 06/25/2014   Procedure: LEFT ULNAR NEUROPLASTY AT ELBOW;  Surgeon: Jolyn Nap, MD;  Location: [facility];  Service: Orthopedics;  Laterality: Left;   WISDOM TOOTH EXTRACTION     Social History:  reports that he has been smoking cigarettes. He has a 10.00 pack-year smoking history. He has never used smokeless tobacco. He reports that he does not drink alcohol and does not use drugs.  Family / Support Systems Marital Status: Single Patient Roles: Partner Spouse/Significant Other: Shirlean Mylar [phone] Other Supports: Donna-sister [phone] Anticipated Caregiver: Shirlean Mylar Ability/Limitations of Caregiver: Shirlean Mylar works from home and can assist him if needed Caregiver Availability: 24/7 Family Dynamics: Close with girlfreind and sister who are very supportive of him. He is hopeful he will get able to take care of himself and not have to bother his girlfriend while she is working  Social History Preferred language: English Religion: Lutheran Cultural Background: No issues Education: [place] - How often do you need to have someone help you when you read instructions, pamphlets, or other written material from your doctor or pharmacy?: Always Writes: Yes Employment Status: Disabled Date Retired/Disabled/Unemployed: 2006 Legal History/Current Legal Issues: No issues Guardian/Conservator:  None-according to MD pt is capable of making his won decisons while here   Abuse/Neglect Abuse/Neglect Assessment Can Be Completed: Yes Physical Abuse: Denies Verbal Abuse: Denies Sexual Abuse: Denies Exploitation of patient/patient's resources: Denies Self-Neglect: Denies  Patient response to: Social Isolation - How often do you feel lonely or isolated from those around you?:  Rarely  Emotional Status Pt's affect, behavior and adjustment status: Pt is motivated to do well and recover from his CABG and get back to his mod/i level. He will push himself to do well here and get as independent as he can be before he leave here. Recent Psychosocial Issues: other health issues Psychiatric History: No history but may benefit from seeing neuro-psych while here for support Substance Abuse History: No issues  Patient / Family Perceptions, Expectations & Goals Pt/Family understanding of illness & functional limitations: Pt is able to explain his condition and surgery he had while here. He is feeling better and ready to work and get up and move. He does talk with the MD when rounding and feels aware of plan moving forward. Premorbid pt/family roles/activities: boyfreind, retiree, friend, brother, etc Anticipated changes in roles/activities/participation: resume Pt/family expectations/goals: Pt states: " I want to be as independent like I was before I came here. "  [employer]: None Premorbid Home Care/DME Agencies: None Transportation available at discharge: Self and girlfriend Is the patient able to respond to transportation needs?: Yes In the past 12 months, has lack of transportation kept you from medical appointments or from getting medications?: No In the past 12 months, has lack of transportation kept you from meetings, work, or from getting things needed for daily living?: No Resource referrals recommended: Neuropsychology  Discharge Planning Living Arrangements: Spouse/significant other Support Systems: Spouse/significant other, Other relatives, Friends/neighbors Type of Residence: Private residence Insurance Resources: Multimedia programmer (specify) ([employer] Medicare) Financial Resources: SSD, Family Support Financial Screen Referred: No Living Expenses: Education officer, community Management: Patient, Significant Other Does the patient have any problems  obtaining your medications?: No Home Management: self and girlfreind Patient/Family Preliminary Plans: Return home with Shirlean Mylar who does work from home and can assist if needed. Pt is hoping to be independent when leave here. Aware team conference Wed will update him then. Care Coordinator Barriers to Discharge: Insurance for SNF coverage Care Coordinator Anticipated Follow Up Needs: HH/OP  Clinical Impression Pleasant gentleman who is motivated to do well here and regain his independence before he leaves here. Aware team conference Wed and will work on discharge needs.  Elease Hashimoto 11/26/2022, 10:25 AM

## 2022-11-27 DIAGNOSIS — K5903 Drug induced constipation: Secondary | ICD-10-CM | POA: Diagnosis not present

## 2022-11-27 DIAGNOSIS — G43709 Chronic migraine without aura, not intractable, without status migrainosus: Secondary | ICD-10-CM

## 2022-11-27 DIAGNOSIS — R5381 Other malaise: Secondary | ICD-10-CM | POA: Diagnosis not present

## 2022-11-27 DIAGNOSIS — R600 Localized edema: Secondary | ICD-10-CM | POA: Diagnosis not present

## 2022-11-27 DIAGNOSIS — R7989 Other specified abnormal findings of blood chemistry: Secondary | ICD-10-CM

## 2022-11-27 DIAGNOSIS — I10 Essential (primary) hypertension: Secondary | ICD-10-CM | POA: Diagnosis not present

## 2022-11-27 MED ORDER — METOPROLOL TARTRATE 12.5 MG HALF TABLET
12.5000 mg | ORAL_TABLET | Freq: Two times a day (BID) | ORAL | Status: DC
  Administered 2022-11-27 – 2022-11-30 (×6): 12.5 mg via ORAL
  Filled 2022-11-27 (×6): qty 1

## 2022-11-27 NOTE — Progress Notes (Signed)
Physical Therapy Session Note  Patient Details  Name: James Torres MRN: [id number] Date of Birth: [birth year]  Today's Date: 11/27/2022 PT Individual Time: 0917-0950 PT Individual Time Calculation (min): 33 min   Short Term Goals: Week 1:  PT Short Term Goal 1 (Week 1): STG = LTG d/t ELOS  Skilled Therapeutic Interventions/Progress Updates: Pt presented in bed sleeping but easily aroused and agreeable to therapy. Pt c/o migraine 10/10, pt premedicated therefore session remained in room with lights dimmed and pt wearing sunglasses for light sensitivity. Performed supine to sit with light minA for truncal support to maintain sternal precautions. Performed Sit to stand x 5 without AD for BLE strengthening and general conditioning. Pt noted to place hands on BLE and demonstrated good balance in standing without use of RW nearby. Pt then participated in ambulation in room with RW and light CGA (for holding pants up) ~151f. Pt intermittently requiring increased time due to RW hitting recliner but did not demonstrate any significant issues with safety awareness. Once pt returned to EOB pt participated in ankle pumps, LAQ, and seated hip flexion 2 x 10. Pt then returned to supine with supervision and use of bed features and was able to reposition to comfort. Pt left in bed at end of session with bed alarm on, call bell within reach and needs met.      Therapy Documentation Precautions:  Precautions Precautions: Fall, Sternal, Other (comment) Precaution Comments: h/o partial petit mal seizures (per chart, pt typically knows when they are about to occur) Restrictions Weight Bearing Restrictions:  (sternal precaution) RUE Weight Bearing: Partial weight bearing LUE Weight Bearing: Partial weight bearing General: PT Amount of Missed Time (min): 15 Minutes PT Missed Treatment Reason: Pain Vital Signs: Therapy Vitals Temp: 98.5 F (36.9 C) Temp Source: Oral Pulse Rate: 72 Resp: 18 BP:  102/60 Patient Position (if appropriate): Lying Oxygen Therapy SpO2: 97 % O2 Device: CPAP Pain:   Mobility:   Locomotion :    Trunk/Postural Assessment :    Balance:   Exercises:   Other Treatments:      Therapy/Group: Individual Therapy  [name] 11/27/2022, 4:31 PM

## 2022-11-27 NOTE — Progress Notes (Signed)
Occupational Therapy Session Note  Patient Details  Name: James Torres MRN: [id number] Date of Birth: December 21, 1965  Today's Date: 11/27/2022 OT Individual Time: 1305-1410 OT Individual Time Calculation (min): 65 min    Short Term Goals: Week 1:  OT Short Term Goal 1 (Week 1): LTG=STG Supervision / setup with ADLs and mod I for transfers  Skilled Therapeutic Interventions/Progress Updates:    Pt received sitting EOB reporting migraines all day limiting his participation in therapy but feeling a bit better now. Pt completed sit > stand from EOB with (S) with the RW (S) ambulatory transfer into the bathroom for shower transfer with CGA. He transferred to the TTB with CGA with min cueing for technique. He completed all UB bathing seated with set up assist. Close (S) for standing balance with UE support on the grab bar. Min A to thoroughly reach under abdomen to wash peri areas. Also trialed LH sponge and pt able to be more independent in bathing. He transferred back to the recliner following shower with CGA using the RW. (S) to don gown and socks. Lengthy discussion re home shower accessibility with OT providing tape measure and measuring shower chair options. Pt left supine with all needs met, all needs met.   Therapy Documentation Precautions:  Precautions Precautions: Fall, Sternal, Other (comment) Precaution Comments: h/o partial petit mal seizures (per chart, pt typically knows when they are about to occur) Restrictions Weight Bearing Restrictions:  (sternal precaution) RUE Weight Bearing: Partial weight bearing LUE Weight Bearing: Partial weight bearing  Therapy/Group: Individual Therapy  [name] 11/27/2022, 6:25 AM

## 2022-11-27 NOTE — Progress Notes (Signed)
Patient has home CPAP at bedside. 

## 2022-11-27 NOTE — Progress Notes (Signed)
Occupational Therapy Session Note  Patient Details  Name: James Torres MRN: [id number] Date of Birth: 09-01-1966  Today's Date: 11/27/2022 OT Individual Time:60 min Missed Therapy due to migraine   OT arrived for scheduled visit this am. Pt reported migraine and in pain with eyes shut and all lights off. Confirmed with [name] from nursing. Will re attempt visit if able.   Barnabas Lister 11/27/2022, 7:35 AM

## 2022-11-27 NOTE — Progress Notes (Signed)
Physical Therapy Session Note  Patient Details  Name: James Torres MRN: [id number] Date of Birth: June 11, 1966  Today's Date: 11/27/2022 PT Individual Time: 1018-1100 PT Individual Time Calculation (min): 42 min   Short Term Goals: Week 1:  PT Short Term Goal 1 (Week 1): STG = LTG d/t ELOS  Skilled Therapeutic Interventions/Progress Updates:    Chart reviewed and pt agreeable to therapy. Pt received semi-reclined in bed with c/o pain in head as migraine that was not quantified. Session focused on safety awareness, home set up discussions, amb training, and balance training to promote successful and independent home access. Pt initiated session with discussion about sleep positions and advice from PT on techniques to avoid stomach sleeping. Pt then transferred to EOB using CGA + bed features. Pt then completed donning of shoes with set up assistance. Pt amb around room with CGA + BRW as pt wanted t oavoid OOR 2/2 migraine. Pt completed 7 mins of walking in room without rest break. Pt then completed series of balance exercises including neutral stance, narrow stance, neutral stance + eyes closed, and tandem stance all for 2x30sec. Pt noted to required CGA in general, but to need MinA when weight predominantly on LLE. Session education at end of session reviewed discharge procedures. At end of session, pt was left semi-reclined in bed with alarm engaged, nurse call bell and all needs in reach.     Therapy Documentation Precautions:  Precautions Precautions: Fall, Sternal, Other (comment) Precaution Comments: h/o partial petit mal seizures (per chart, pt typically knows when they are about to occur) Restrictions Weight Bearing Restrictions:  (sternal precaution) RUE Weight Bearing: Partial weight bearing LUE Weight Bearing: Partial weight bearing    Therapy/Group: Individual Therapy  Marquette Old, PT, DPT 11/27/2022, 3:43 PM

## 2022-11-27 NOTE — Progress Notes (Signed)
PROGRESS NOTE   Subjective/Complaints: Reports he feels constipated this AM. He said he agreed to take his miralax this Am but would like to hold off on additional medications for now.  Reports chest wall pain is well controlled.    ROS: +migraines (chronic). +insomnia. +constipation. Denies fevers, chills, CP, SOB, abd pain, N/V/D, cough, vision changes, new/worsening numbness, tingling, focal weakness, or any other complaints at this time.   Objective:   No results found. Recent Labs    11/24/22 0949 11/26/22 0530  WBC 9.4 8.6  HGB 13.7 13.7  HCT 39.8 40.9  PLT 266 287    Recent Labs    11/24/22 0949 11/26/22 0530  NA 132* 132*  K 4.4 4.1  CL 95* 97*  CO2 28 28  GLUCOSE 134* 122*  BUN 14 10  CREATININE 1.03 1.19  CALCIUM 9.1 9.0     Intake/Output Summary (Last 24 hours) at 11/27/2022 0819 Last data filed at 11/27/2022 0445 Gross per 24 hour  Intake 832 ml  Output 725 ml  Net 107 ml         Physical Exam: Vital Signs Blood pressure (!) 101/52, pulse 65, temperature 98.8 F (37.1 C), resp. rate 15, height 6' (1.829 m), weight (!) 167.3 kg, SpO2 98 %.  Constitutional:      General: He is not in acute distress.    Appearance: He is obese.  HENT: [place]/AT, MMM Eyes: conjugate gaze Cardiovascular: RRR, nl s1/s2, no m/r/g Pulmonary: CTAB, no w/r/r, normal effort, no distress Abdominal: soft, obese but nondistended, nonTTP, no r/g/r, +BS throughout Musculoskeletal:        General: No swelling. Normal range of motion.     Cervical back: Normal range of motion.     Comments: Trace LE edema  Skin:    General: Skin is warm and dry.     Comments: Chest incision CDI.  Neurological:     Mental Status: He is alert.     Comments: Alert and awake. Follows commands. Normal insight and awareness. Intact Memory. Normal language and speech. Cranial nerve exam unremarkable. UE motor 4+ to 5/5. LE 4/5 prox to 5/5  distal. Sensory exam normal for light touch and pain in all 4 limbs. No limb ataxia or cerebellar signs. No abnormal tone appreciated.    Psychiatric:        Mood and Affect: Mood normal.        Behavior: Behavior normal.  pleasant   Assessment/Plan: 1. Functional deficits which require 3+ hours per day of interdisciplinary therapy in a comprehensive inpatient rehab setting. Physiatrist is providing close team supervision and 24 hour management of active medical problems listed below. Physiatrist and rehab team continue to assess barriers to discharge/monitor patient progress toward functional and medical goals  Care Tool:  Bathing    Body parts bathed by patient: Right arm, Left arm, Chest, Right upper leg, Left upper leg, Face   Body parts bathed by helper: Abdomen, Front perineal area, Buttocks, Right lower leg, Left lower leg     Bathing assist Assist Level: Moderate Assistance - Patient 50 - 74%     Upper Body Dressing/Undressing Upper body dressing   What  is the patient wearing?: Hospital gown only    Upper body assist Assist Level: Supervision/Verbal cueing    Lower Body Dressing/Undressing Lower body dressing      What is the patient wearing?: Underwear/pull up, Pants     Lower body assist Assist for lower body dressing: Minimal Assistance - Patient > 75%     Toileting Toileting Toileting Activity did not occur (Clothing management and hygiene only): N/A (no void or bm)  Toileting assist Assist for toileting: Minimal Assistance - Patient > 75%     Transfers Chair/bed transfer  Transfers assist     Chair/bed transfer assist level: Minimal Assistance - Patient > 75%     Locomotion Ambulation   Ambulation assist      Assist level: Contact Guard/Touching assist Assistive device: Walker-rolling Max distance: 135 ft   Walk 10 feet activity   Assist     Assist level: Contact Guard/Touching assist Assistive device: No Device   Walk 50 feet  activity   Assist    Assist level: Contact Guard/Touching assist Assistive device: Walker-rolling    Walk 150 feet activity   Assist    Assist level: Contact Guard/Touching assist Assistive device: Walker-rolling    Walk 10 feet on uneven surface  activity   Assist Walk 10 feet on uneven surfaces activity did not occur: Safety/medical concerns         Wheelchair     Assist Is the patient using a wheelchair?: Yes Type of Wheelchair: Manual    Wheelchair assist level: Dependent - Patient 0% Max wheelchair distance: 200 ft    Wheelchair 50 feet with 2 turns activity    Assist        Assist Level: Dependent - Patient 0%   Wheelchair 150 feet activity     Assist      Assist Level: Dependent - Patient 0%   Blood pressure (!) 101/52, pulse 65, temperature 98.8 F (37.1 C), resp. rate 15, height 6' (1.829 m), weight (!) 167.3 kg, SpO2 98 %.  Medical Problem List and Plan: 1. Functional deficits secondary to debility after MI/CABG and associated medical complications             -patient may  shower             -ELOS/Goals: 10-12 days, supervision goals with PT, OT  -Team conference tomorrow   2.  Antithrombotics: -DVT/anticoagulation:  Pharmaceutical: Lovenox '40mg'$  QD             -antiplatelet therapy: Plavix '75mg'$  QD and aspirin 81 mg daily   3. Pain Management: Tylenol and oxycodone PRN   4. Mood/Behavior/Sleep: LCSW to evaluate and provide emotional support             -anxiety disorder: Continue Celexa 40 mg and Klonopin 0.5 mg BID             -antipsychotic agents: n/a -11/24/22 ordered Melatonin 5-'10mg'$  QHS PRN for sleep/insomnia--reminded him he can ask for this   5. Neuropsych/cognition: This patient is capable of making decisions on his own behalf.   6. Skin/Wound Care: Routine skin care checks              7. Fluids/Electrolytes/Nutrition: routine Is and Os and follow-up chemistries (weekly BMP starting 11/26/22) -11/24/22 Na 132,  Cl 95 fairly stable; Albumin low 2.9, AST 49/ALT 48 mildly elevated--likely transient transaminitis vs fatty liver-- consider recheck of LFTs this week  -Recheck Thursday ordered   9: GERD: continue Protonix 80 mg  10: Migraine headache: worse since hospitalization -at home uses tylenol or Roxicet 5/325 depending upon severity -uses Ubrelvy as needed as well with middling results--not on formulary per patient             -Ajovy monthly  -1/23 reports APAP, Oxycodone 2.'5mg'$ , percocet 5 helping in pain, he says he uses this combination at home   11: CAD/ICM/STEMI: s/p CABG times one vessel 11/13/22, POD #12             -continue sternal precautions   12: OSA on CPAP             -continue HS with oxygen  13: Volume overload: continue Lasix 40 mg daily; follow-up BMP             -daily weights             -Lower extremity edema improved -11/24/22 mild hyponatremia/hypochloremia but stable (Na 132, Cl 95); monitor on weekly labs -1/23 weight a little up today, continue to monitor closely, continue lasix Filed Weights   11/25/22 0529 11/26/22 0431 11/27/22 0451  Weight: (!) 164 kg (!) 165.7 kg (!) 167.3 kg    14: Pneumonia, possible: finished antibiotics, no fever or cough             -O2 via [place] prn, FV   15: Class 3 obesity: BMI = 49.42, diet counseling would be beneficial   16: Seizure disorder:             -Klonopin 0.25-0.5 mg BID             -Lamictal 200 mg q AM and q HS             -Keppra XR 1000 mg BID             -Fycompa 8 mg daily   17: Hyperlipidemia: Crestor 40 mg daily   18: Tobacco use: cessation counseling   19: Hypertension: monitor BP TID and prn             -continue metoprolol 25 mg BID             -home lisinopril not restarted   -BP continues to be soft, decrease metoprolol dose to 12.'5mg'$  BID Vitals:   11/23/22 1603 11/23/22 1950 11/24/22 0406 11/24/22 1329  BP: (!) 107/51 (!) 104/56 110/70 (!) 100/54   11/24/22 1958 11/25/22 0340 11/25/22 1956  11/26/22 0304  BP: 113/60 108/63 (!) 103/38 122/69   11/26/22 1320 11/26/22 2159 11/26/22 2159 11/27/22 0451  BP: 122/68 (!) 106/52 (!) 106/52 (!) 101/52      20: Left hip pain/DJD: surgery pending weight loss; follow-up outpatient   21: Pre-diabetes: Hgb A1C 6.1 on 11/10/22, no meds currently  22: Constipation: current regimen Colace '200mg'$  QD, MoM PRN, Sorbitol PRN, Fleet PRN  -11/24/22 LBM 2 days ago, ordered Miralax 17g BID, monitor for effect  -11/25/22 BM yesterday, continue current regimen  -1/22 Pt to try miralax first, discussed sorbitol PRN available  LOS: 4 days A FACE TO FACE EVALUATION WAS PERFORMED  Jennye Boroughs 11/27/2022, 8:19 AM

## 2022-11-28 DIAGNOSIS — K5903 Drug induced constipation: Secondary | ICD-10-CM | POA: Diagnosis not present

## 2022-11-28 DIAGNOSIS — I10 Essential (primary) hypertension: Secondary | ICD-10-CM | POA: Diagnosis not present

## 2022-11-28 DIAGNOSIS — R5381 Other malaise: Secondary | ICD-10-CM | POA: Diagnosis not present

## 2022-11-28 DIAGNOSIS — G43709 Chronic migraine without aura, not intractable, without status migrainosus: Secondary | ICD-10-CM | POA: Diagnosis not present

## 2022-11-28 MED FILL — Sodium Chloride IV Soln 0.9%: INTRAVENOUS | Qty: 3000 | Status: AC

## 2022-11-28 MED FILL — Heparin Sodium (Porcine) Inj 1000 Unit/ML: INTRAMUSCULAR | Qty: 30 | Status: AC

## 2022-11-28 MED FILL — Lidocaine HCl Local Preservative Free (PF) Inj 2%: INTRAMUSCULAR | Qty: 14 | Status: AC

## 2022-11-28 MED FILL — Heparin Sodium (Porcine) Inj 1000 Unit/ML: Qty: 1000 | Status: AC

## 2022-11-28 MED FILL — Potassium Chloride Inj 2 mEq/ML: INTRAVENOUS | Qty: 40 | Status: AC

## 2022-11-28 MED FILL — Cefazolin Sodium-Dextrose IV Solution 2 GM/100ML-4%: INTRAVENOUS | Qty: 100 | Status: AC

## 2022-11-28 NOTE — Progress Notes (Signed)
Recreational Therapy Session Note  Patient Details  Name: James Torres MRN: [id number] Date of Birth: [birth year] Today's Date: 11/28/2022  No c/o Pt participated in animal assisted activity seated EOM with supervison.  Pt appreciative of thei visit.  [place] 11/28/2022, 1:27 PM

## 2022-11-28 NOTE — Progress Notes (Addendum)
PROGRESS NOTE   Subjective/Complaints: Reports he had a small BM this AM. Would would like to continue miralax for now, he does not want different laxative at this time.     ROS: +migraines (chronic). +insomnia. +constipation. Denies fevers, chills, CP, SOB, abd pain, N/V/D,  new/worsening numbness, dizziness, focal weakness, or any other complaints at this time.   Objective:   No results found. Recent Labs    11/26/22 0530  WBC 8.6  HGB 13.7  HCT 40.9  PLT 287    Recent Labs    11/26/22 0530  NA 132*  K 4.1  CL 97*  CO2 28  GLUCOSE 122*  BUN 10  CREATININE 1.19  CALCIUM 9.0     Intake/Output Summary (Last 24 hours) at 11/28/2022 0849 Last data filed at 11/28/2022 5465 Gross per 24 hour  Intake 712 ml  Output 800 ml  Net -88 ml         Physical Exam: Vital Signs Blood pressure 117/67, pulse 65, temperature 98.9 F (37.2 C), temperature source Oral, resp. rate 18, height 6' (1.829 m), weight (!) 167.7 kg, SpO2 99 %.  Constitutional:      General: He is not in acute distress. Sitting edge of bed    Appearance: He is obese.  HENT: [place]/AT, MMM Eyes: conjugate gaze Cardiovascular: RRR, nl s1/s2, no m/r/g Pulmonary: CTAB, no w/r/r, normal effort, no distress Abdominal: soft, obese but nondistended, nonTTP, no r/g/r, +BS throughout Musculoskeletal:        General: No swelling. Normal range of motion.     Cervical back: Normal range of motion.     Comments: Trace LE edema  Skin:    General: Skin is warm and dry.     Comments: Chest incision CDI.  Neurological:     Mental Status: He is alert.     Comments: Alert and awake. Follows commands. Normal insight and awareness. Intact Memory. Normal language and speech. Cranial nerve exam unremarkable. UE motor 4+ to 5/5. LE 4/5 prox to 5/5 distal. Sensory exam normal for light touch and pain in all 4 limbs. No limb ataxia or cerebellar signs. No abnormal tone  appreciated.    Psychiatric:        Mood and Affect: Mood normal.        Behavior: Behavior normal.  pleasant   Assessment/Plan: 1. Functional deficits which require 3+ hours per day of interdisciplinary therapy in a comprehensive inpatient rehab setting. Physiatrist is providing close team supervision and 24 hour management of active medical problems listed below. Physiatrist and rehab team continue to assess barriers to discharge/monitor patient progress toward functional and medical goals  Care Tool:  Bathing    Body parts bathed by patient: Right arm, Left arm, Chest, Right upper leg, Left upper leg, Face   Body parts bathed by helper: Abdomen, Front perineal area, Buttocks, Right lower leg, Left lower leg     Bathing assist Assist Level: Moderate Assistance - Patient 50 - 74%     Upper Body Dressing/Undressing Upper body dressing   What is the patient wearing?: Hospital gown only    Upper body assist Assist Level: Supervision/Verbal cueing    Lower  Body Dressing/Undressing Lower body dressing      What is the patient wearing?: Underwear/pull up, Pants     Lower body assist Assist for lower body dressing: Minimal Assistance - Patient > 75%     Toileting Toileting Toileting Activity did not occur (Clothing management and hygiene only): N/A (no void or bm)  Toileting assist Assist for toileting: Minimal Assistance - Patient > 75%     Transfers Chair/bed transfer  Transfers assist     Chair/bed transfer assist level: Minimal Assistance - Patient > 75%     Locomotion Ambulation   Ambulation assist      Assist level: Contact Guard/Touching assist Assistive device: Walker-rolling Max distance: 135 ft   Walk 10 feet activity   Assist     Assist level: Contact Guard/Touching assist Assistive device: No Device   Walk 50 feet activity   Assist    Assist level: Contact Guard/Touching assist Assistive device: Walker-rolling    Walk 150 feet  activity   Assist    Assist level: Contact Guard/Touching assist Assistive device: Walker-rolling    Walk 10 feet on uneven surface  activity   Assist Walk 10 feet on uneven surfaces activity did not occur: Safety/medical concerns         Wheelchair     Assist Is the patient using a wheelchair?: Yes Type of Wheelchair: Manual    Wheelchair assist level: Dependent - Patient 0% Max wheelchair distance: 200 ft    Wheelchair 50 feet with 2 turns activity    Assist        Assist Level: Dependent - Patient 0%   Wheelchair 150 feet activity     Assist      Assist Level: Dependent - Patient 0%   Blood pressure 117/67, pulse 65, temperature 98.9 F (37.2 C), temperature source Oral, resp. rate 18, height 6' (1.829 m), weight (!) 167.7 kg, SpO2 99 %.  Medical Problem List and Plan: 1. Functional deficits secondary to debility after MI/CABG and associated medical complications             -patient may  shower             -ELOS/Goals: 10-12 days, supervision goals with PT, OT  -Team conference today please see physician documentation under team conference tab, met with team  to discuss problems,progress, and goals. Formulized individual treatment plan based on medical history, underlying problem and comorbidities.     2.  Antithrombotics: -DVT/anticoagulation:  Pharmaceutical: Lovenox '40mg'$  QD             -antiplatelet therapy: Plavix '75mg'$  QD and aspirin 81 mg daily   3. Pain Management: Tylenol and oxycodone PRN   4. Mood/Behavior/Sleep: LCSW to evaluate and provide emotional support             -anxiety disorder: Continue Celexa 40 mg and Klonopin 0.5 mg BID             -antipsychotic agents: n/a -11/24/22 ordered Melatonin 5-'10mg'$  QHS PRN for sleep/insomnia--reminded him he can ask for this   5. Neuropsych/cognition: This patient is capable of making decisions on his own behalf.   6. Skin/Wound Care: Routine skin care checks              7.  Fluids/Electrolytes/Nutrition: routine Is and Os and follow-up chemistries (weekly BMP starting 11/26/22) -11/24/22 Na 132, Cl 95 fairly stable; Albumin low 2.9, AST 49/ALT 48 mildly elevated--likely transient transaminitis vs fatty liver-- consider recheck of LFTs this week  -  Recheck tomorrow   9: GERD: continue Protonix 80 mg    10: Migraine headache: worse since hospitalization -at home uses tylenol or Roxicet 5/325 depending upon severity -uses Ubrelvy as needed as well with middling results--not on formulary per patient             -Ajovy monthly  -1/23 reports APAP, Oxycodone 2.'5mg'$ , percocet 5 helping in pain, he says he uses this combination at home  Reports HA is at baseline, medications helping   11: CAD/ICM/STEMI: s/p CABG times one vessel 11/13/22, POD #12             -continue sternal precautions   12: OSA on CPAP             -continue HS with oxygen  -Complaint with CPAP  13: Volume overload: continue Lasix 40 mg daily; follow-up BMP             -daily weights             -Lower extremity edema improved -11/24/22 mild hyponatremia/hypochloremia but stable (Na 132, Cl 95); monitor on weekly labs -1/24 recheck wt toda Filed Weights   11/26/22 0431 11/27/22 0451 11/28/22 0437  Weight: (!) 165.7 kg (!) 167.3 kg (!) 167.7 kg    14: Pneumonia, possible: finished antibiotics, no fever or cough             -O2 via [place] prn, FV   15: Class 3 obesity: BMI = 49.42, diet counseling would be beneficial   16: Seizure disorder:             -Klonopin 0.25-0.5 mg BID             -Lamictal 200 mg q AM and q HS             -Keppra XR 1000 mg BID             -Fycompa 8 mg daily   17: Hyperlipidemia: Crestor 40 mg daily   18: Tobacco use: cessation counseling   19: Hypertension: monitor BP TID and prn             -continue metoprolol 25 mg BID             -home lisinopril not restarted   -BP continues to be soft, decrease metoprolol dose to 12.'5mg'$  BID  1/24 monitor response to  medication change Vitals:   11/24/22 1329 11/24/22 1958 11/25/22 0340 11/25/22 1956  BP: (!) 100/54 113/60 108/63 (!) 103/38   11/26/22 0304 11/26/22 1320 11/26/22 2159 11/26/22 2159  BP: 122/69 122/68 (!) 106/52 (!) 106/52   11/27/22 0451 11/27/22 1455 11/27/22 1959 11/28/22 0437  BP: (!) 101/52 102/60 (!) 111/55 117/67      20: Left hip pain/DJD: surgery pending weight loss; follow-up outpatient   21: Pre-diabetes: Hgb A1C 6.1 on 11/10/22, no meds currently  22: Constipation: current regimen Colace '200mg'$  QD, MoM PRN, Sorbitol PRN, Fleet PRN  -11/24/22 LBM 2 days ago, ordered Miralax 17g BID, monitor for effect  -11/25/22 BM yesterday, continue current regimen  -1/22 Pt to try miralax first, discussed sorbitol PRN available  -1/24 small BM today, Advised him prn sorbitol availab.e  LOS: 5 days A FACE TO FACE EVALUATION WAS PERFORMED  Jennye Boroughs 11/28/2022, 8:49 AM

## 2022-11-28 NOTE — Progress Notes (Addendum)
Physical Therapy Session Note  Patient Details  Name: James Torres MRN: [id number] Date of Birth: 1966/02/19  Today's Date: 11/28/2022 PT Individual Time: 1100-1200 PT Individual Time Calculation (min): 60 min   Short Term Goals: Week 1:  PT Short Term Goal 1 (Week 1): STG = LTG d/t ELOS  Skilled Therapeutic Interventions/Progress Updates: Pt presents sitting in recliner and agreeable to therapy.  Discussed sternal precautions w/ him and need for improved posture for scar formation to sternum, while still maintaining precautions.  Pt states pain 0/10 but at conclusion, 3/10 to knees.  Pt transfers w/ CGA sit to stand and cues for breathing technique.  Pt amb 150' to main gym w/ close supervision and verbal cues for posture and walker maintenance.  Pt performed 2 x 5 sit to stand from 20" mat table and then 2 x 5 w/ Airex cushion .  Occasional min A required w/ continued cues for sequencing, especially leaning forward and for breathing.  Mat table was raised to 23" for improved [place] w/ sit <> stand transfers as fatigues.  Pt performed standing horseshoe toss to Left, 1 set reaching w/ L hand and throwing w/ right, 1 set reaching w/ R hand for trunk rotation and then 2 sets to R in same order w/ L hand throwing.  Pt requires only CGA standing on Airex cushion.  Pt amb x 180' w/ RW and close supervision/CGA w/ noted fatigue to B knees.  Pt amb back to room w/ same and remained sitting in recliner, all needs in reach.     Therapy Documentation Precautions:  Precautions Precautions: Fall, Sternal, Other (comment) Precaution Comments: h/o partial petit mal seizures (per chart, pt typically knows when they are about to occur) Restrictions Weight Bearing Restrictions:  (sternal precaution) RUE Weight Bearing: Partial weight bearing LUE Weight Bearing: Partial weight bearing General:   Vital Signs:   Pain:3/10 w/ activity, knees. Pain Assessment Pain Scale: 0-10 Pain Score: 0-No  pain    Therapy/Group: Individual Therapy  Ladoris Gene 11/28/2022, 12:07 PM

## 2022-11-28 NOTE — Patient Care Conference (Signed)
Inpatient RehabilitationTeam Conference and Plan of Care Update Date: 11/28/2022   Time: 12:27 PM    Patient Name: James Torres      Medical Record Number: [id number]  Date of Birth: 03-28-1966 Sex: Male         Room/Bed: 4M07C/4M07C-01 Payor Info: Payor: [employer] MEDICARE / Plan: [employer] MEDICARE CHOICE PPO / Product Type: *No Product type* /    Admit Date/Time:  11/23/2022  4:17 PM  Primary Diagnosis:  [facility] Problems: Principal Problem:   Debility    Expected Discharge Date: Expected Discharge Date: 11/30/22  Team Members Present: Physician leading conference: Dr. Jennye Boroughs Social Worker Present: Ovidio Kin, LCSW Nurse Present: Dorien Chihuahua, RN PT Present: Terence Lux, PT OT Present: Laverle Hobby, OT PPS Coordinator present : Gunnar Fusi, SLP     Current Status/Progress Goal Weekly Team Focus  Bowel/Bladder   Continent of B/B   Remain continent   Assist with toileting as needed    Swallow/Nutrition/ Hydration               ADL's   (S) LB dressing, min A LB bathing, (S) UB ADLs, (S) transfers with RW   mod I overall   ADLs, endurance, balance, transfers, home safety    Mobility   CGA to supervision bed mobility with use of bed features,minA without bed features, superivison transfers, CGA gait with RW up to 248f, CGA stairs   supervision overall  BLE strengthening L>R, endurance, gait, d/c planning    Communication                Safety/Cognition/ Behavioral Observations               Pain   No c/o pain at this time   Remain pain free   Assess Qshift and prn    Skin   Surgical incision, remainder of skin intact   Promote healing and prevention of infection  Assess Qshift and prn      Discharge Planning:  Home with girlfreind who works from home and can assist. Pt doing well in therapies and hopeful to reach mod/i level   Team Discussion: Patient with chronic migraines. Constipation addressed, incision looks  good. Continue daily weights with lasix.   Patient on target to meet rehab goals: yes, currently needs supervision for ADLs and min assist for lower body bathing. Needs supervision for transfers and ambulation and complete steps with one railing. Goals for discharge set for mod I overall.  *See Care Plan and progress notes for long and short-term goals.   Revisions to Treatment Plan:  N/a   Teaching Needs: Safety, sternal precautions, medications, dietary modification, transfers, etc.  Current Barriers to Discharge: Decreased caregiver support and Home enviroment access/layout  Possible Resolutions to Barriers: Family education 11/29/22 HH follow up services DME: BA - RW, TTB     Medical Summary Current Status: debility, lfts elevated, edema in legs, HA, constipation, elevated FLTS, HTN  Barriers to Discharge: Electrolyte abnormality;Medical stability  Barriers to Discharge Comments: debility, lfts elevated, edema in legs, HA, constipation, elevated FLTS, HTN Possible Resolutions to [employer]: laxative, monitor weight, monitor incision, continue pain medications, decreased metoprolol   Continued Need for Acute Rehabilitation Level of Care: The patient requires daily medical management by a physician with specialized training in physical medicine and rehabilitation for the following reasons: Direction of a multidisciplinary physical rehabilitation program to maximize functional independence : Yes Medical management of patient stability for increased activity during participation  in an intensive rehabilitation regime.: Yes Analysis of laboratory values and/or radiology reports with any subsequent need for medication adjustment and/or medical intervention. : Yes   I attest that I was present, lead the team conference, and concur with the assessment and plan of the team.   Dorien Chihuahua B 11/28/2022, 2:10 PM

## 2022-11-28 NOTE — Progress Notes (Signed)
Patient placed himself of cpap machine and sleeping.

## 2022-11-28 NOTE — Progress Notes (Signed)
Physical Therapy Session Note  Patient Details  Name: James Torres MRN: [id number] Date of Birth: 1966/05/26  Today's Date: 11/28/2022 PT Individual Time: 1405-1520 PT Individual Time Calculation (min): 75 min   Short Term Goals: Week 1:  PT Short Term Goal 1 (Week 1): STG = LTG d/t ELOS  Skilled Therapeutic Interventions/Progress Updates:  presented in recliner agreeable to therapy. Pt states unrated pain, controlled throughout session. Discussed with pt current disposition for d/c with pt pleased with progress and discussed any current concerns to d/c. Pt voicing only one concern regarding bed mobility (fiancee more concerned if she has to provide assistance to BLE). Pt agreeable to go to ADL apt and trial bed mobility. Extensive discussion prior to leaving room regarding use of wedge or to obtain bed rail to limit mobility in bed (pt is a side or stomach sleeper). Also discussed initially sleeping in recliner for a few days and then testing sleeping on bed. Advised fine to sleep on recliner initially then progress to bed and see which option works best for pt as all are ok and feasible. Pt also stating has requested to receive rollator vs RW. Pt aware if has hip replacement rollator will not be safe and will need to obtain RW on own. Pt verbalized understanding. Pt then performed stand from recliner with supervision and began ambulating with rollator. PTA and pt noted that rollator too low for pt therefore pt returned to sitting and PTA adjusted rollator. Pt then ambulated to ADL apt with CGA fading to supervision with pt demonstrating good safety with rollator. In ADL apt pt was able to safely park rollator against wall while PTA set up bed. Pt noted that bed was an in between height of pt's and fiancee's bed. Pt then ambulated to bed and was able to transfer to supine with supervision. Pt required increased effort but was able to return to sitting in same manner. PTA then discussed use of gait  belt in way of leg lifter. Pt was able to perform also with supervision. Pt pleasantly surprised with ability to perform bed mobilty on standard bed. Pt then with some questions regarding workplace set up at home. Trialed set up with rollator and with standard chair. Pt was able to stand from standard chair with supervision with PTA explaining that he may require assistance for chair placement but would suggest using dining room chair vs office chair. Pt then participated in 6MWT with pt able to ambulate ~67mn prior to requiring seated rest. Pt ambulated a total 3162fwith rollator and supervision. Pt then ambulated back to room requiring x 2 seated rest breaks. Pt requesting to use bathroom and performed ambulatory transfer to toilet with supervision with supervision toilet transfers. Pt left at toilet (for BM) verbalizing understanding to use call bell once completed and Kayla NT notified of pt's disposition.      Therapy Documentation Precautions:  Precautions Precautions: Fall, Sternal, Other (comment) Precaution Comments: h/o partial petit mal seizures (per chart, pt typically knows when they are about to occur) Restrictions Weight Bearing Restrictions:  (sternal precaution) RUE Weight Bearing: Partial weight bearing LUE Weight Bearing: Partial weight bearing General:   Vital Signs: Therapy Vitals Temp: 98.4 F (36.9 C) Temp Source: Oral Pulse Rate: 75 Resp: 17 BP: 119/70 Patient Position (if appropriate): Sitting Oxygen Therapy SpO2: 98 % O2 Device: Room Air Pain:   Mobility:   Locomotion :    Trunk/Postural Assessment :    Balance:   Exercises:  Other Treatments:      Therapy/Group: Individual Therapy  Mazie Fencl 11/28/2022, 3:41 PM

## 2022-11-28 NOTE — Progress Notes (Signed)
Occupational Therapy Session Note  Patient Details  Name: James Torres MRN: [id number] Date of Birth: 1966/10/17  Today's Date: 11/28/2022 OT Individual Time: 5809-9833 OT Individual Time Calculation (min): 71 min    Short Term Goals: Week 1:  OT Short Term Goal 1 (Week 1): LTG=STG Supervision / setup with ADLs and mod I for transfers  Skilled Therapeutic Interventions/Progress Updates:    Pt received EOB with no c/o pain, agreeable to shower. He completed functional mobiltiy into the bathroom with (S) using the RW and completed transfer to TTB with (S). He was provided a LH sponge and bench turned out to facilitate washing under abdomen by leaning posteriorly. He completed UB bathing with (S) overall. Continued discussion re shower chair to use at home. Pt was able to thoroughly wash peri areas and under abdomen in leaned back method with the TTB with (S). He transferred back to the recliner and donned UB/LB clothing with (S). He completed functional mobility to the ADL apt with (S) with the RW, 100 ft. He completed furniture transfer to a rocking recliner- required CGA overall with it rocking. He completed a tub transfer using a TTB and was able to demonstrate competency with the small space he'll have to use it in at home. He returned to his room and was left sitting in the recliner with all needs met.   Therapy Documentation Precautions:  Precautions Precautions: Fall, Sternal, Other (comment) Precaution Comments: h/o partial petit mal seizures (per chart, pt typically knows when they are about to occur) Restrictions Weight Bearing Restrictions:  (sternal precaution) RUE Weight Bearing: Partial weight bearing LUE Weight Bearing: Partial weight bearing   Therapy/Group: Individual Therapy  [name] 11/28/2022, 6:38 AM

## 2022-11-28 NOTE — Progress Notes (Signed)
Patient ID: James Torres, male   DOB: 1965-12-04, 57 y.o.   MRN: [id number]  Met with pt and he had girlfriend on speaker phone to update both regarding team conference mod/I level goals and discharge date 1/26. Girlfriend had many questions and offered for her to come in education tomorrow. Have scheduled for tomorrow at 10-12. Will let team know, he wants to try the rollator. Have messaged Rosita-PT to ask to try in session today. Will work on discharge needs. Hopefully once education completed questions will be addressed.

## 2022-11-29 ENCOUNTER — Other Ambulatory Visit (HOSPITAL_COMMUNITY): Payer: Self-pay

## 2022-11-29 DIAGNOSIS — R7989 Other specified abnormal findings of blood chemistry: Secondary | ICD-10-CM

## 2022-11-29 LAB — COMPREHENSIVE METABOLIC PANEL
ALT: 28 U/L (ref 0–44)
AST: 27 U/L (ref 15–41)
Albumin: 3.1 g/dL — ABNORMAL LOW (ref 3.5–5.0)
Alkaline Phosphatase: 82 U/L (ref 38–126)
Anion gap: 10 (ref 5–15)
BUN: 7 mg/dL (ref 6–20)
CO2: 26 mmol/L (ref 22–32)
Calcium: 9.2 mg/dL (ref 8.9–10.3)
Chloride: 102 mmol/L (ref 98–111)
Creatinine, Ser: 1.04 mg/dL (ref 0.61–1.24)
GFR, Estimated: >60 mL/min (ref >60)
Glucose, Bld: 116 mg/dL — ABNORMAL HIGH (ref 70–99)
Potassium: 4.4 mmol/L (ref 3.5–5.1)
Sodium: 138 mmol/L (ref 135–145)
Total Bilirubin: 0.4 mg/dL (ref 0.3–1.2)
Total Protein: 6.8 g/dL (ref 6.5–8.1)

## 2022-11-29 MED ORDER — ROSUVASTATIN CALCIUM 40 MG PO TABS
40.0000 mg | ORAL_TABLET | Freq: Every day | ORAL | 0 refills | Status: DC
  Filled 2022-11-29: qty 30, 30d supply, fill #0

## 2022-11-29 MED ORDER — OXYCODONE-ACETAMINOPHEN 5-325 MG PO TABS
1.0000 | ORAL_TABLET | Freq: Three times a day (TID) | ORAL | 0 refills | Status: DC | PRN
  Filled 2022-11-29: qty 21, 7d supply, fill #0

## 2022-11-29 MED ORDER — CLOPIDOGREL BISULFATE 75 MG PO TABS
75.0000 mg | ORAL_TABLET | Freq: Every day | ORAL | 0 refills | Status: DC
  Filled 2022-11-29: qty 30, 30d supply, fill #0

## 2022-11-29 MED ORDER — METOPROLOL TARTRATE 25 MG PO TABS
12.5000 mg | ORAL_TABLET | Freq: Two times a day (BID) | ORAL | 0 refills | Status: DC
  Filled 2022-11-29: qty 30, 30d supply, fill #0

## 2022-11-29 MED ORDER — ASPIRIN 81 MG PO TBEC
81.0000 mg | DELAYED_RELEASE_TABLET | Freq: Every day | ORAL | 12 refills | Status: AC
  Filled 2022-11-29: qty 30, 30d supply, fill #0

## 2022-11-29 MED ORDER — DOCUSATE SODIUM 100 MG PO CAPS
200.0000 mg | ORAL_CAPSULE | Freq: Every day | ORAL | 0 refills | Status: AC
  Filled 2022-11-29: qty 60, 30d supply, fill #0

## 2022-11-29 MED ORDER — FUROSEMIDE 40 MG PO TABS
40.0000 mg | ORAL_TABLET | Freq: Every day | ORAL | 0 refills | Status: DC
  Filled 2022-11-29: qty 30, 30d supply, fill #0

## 2022-11-29 MED ORDER — PANTOPRAZOLE SODIUM 40 MG PO TBEC
80.0000 mg | DELAYED_RELEASE_TABLET | Freq: Every day | ORAL | 0 refills | Status: DC
  Filled 2022-11-29: qty 30, 15d supply, fill #0

## 2022-11-29 MED ORDER — ACETAMINOPHEN 325 MG PO TABS: 325.0000 mg | ORAL_TABLET | ORAL | Status: DC | PRN

## 2022-11-29 MED ORDER — POLYETHYLENE GLYCOL 3350 17 GM/SCOOP PO POWD
17.0000 g | Freq: Two times a day (BID) | ORAL | 0 refills | Status: DC
  Filled 2022-11-29: qty 238, 7d supply, fill #0

## 2022-11-29 NOTE — Progress Notes (Signed)
Physical Therapy Session Note  Patient Details  Name: James Torres MRN: [id number] Date of Birth: 1966/03/03  Today's Date: 11/29/2022 PT Individual Time: 1345-1433 PT Individual Time Calculation (min): 48 min   Short Term Goals: Week 1:  PT Short Term Goal 1 (Week 1): STG = LTG d/t ELOS  Skilled Therapeutic Interventions/Progress Updates:    Chart reviewed and pt agreeable to therapy. Pt received seated EOB with no c/o pain. Session focused on review of mobility and home set up for d/c and ambulation endurance to promote community access. Pt initiated session with transfer to [facility] using ModI + rollator. Pt then completed 12 stairs with supervision + B hand rails. Pt returned to room and discussed home set up with PT for safety in the home with rollator. Pt then amb >15 mins using ModI + rollator. Pt demonstrated safe set up of rollator for rest break. At end of session, pt was left seated in recliner with alarm engaged, nurse call bell and all needs in reach.     Therapy Documentation Precautions:  Precautions Precautions: Fall, Sternal, Other (comment) Precaution Comments: h/o partial petit mal seizures (per chart, pt typically knows when they are about to occur) Restrictions Weight Bearing Restrictions: No RUE Weight Bearing: Partial weight bearing RUE Partial Weight Bearing Percentage or Pounds: sternal precautions LUE Weight Bearing: Partial weight bearing LUE Partial Weight Bearing Percentage or Pounds: sternal precautions Other Position/Activity Restrictions: sternal precautions    Therapy/Group: Individual Therapy  Marquette Old, PT, DPT 11/29/2022, 2:44 PM

## 2022-11-29 NOTE — Progress Notes (Signed)
Patient ID: James Torres, male   DOB: [birth year], 57 y.o.   MRN: [id number]  Met with pt missed girlfriend when here for family education. He reports it went well and both feel prepared for discharge tomorrow. His rollator was delivered to his room and he plans to get tub equipment on his own since not covered. Have set up home health and explained how will work once home. He feels ready to go home tomorrow.

## 2022-11-29 NOTE — Progress Notes (Addendum)
PROGRESS NOTE   Subjective/Complaints: Reports he had a seizure earlier today. He has these a few times a month intermittently. He reports he will first get a metallic taste, then he will stare off and his eyes may close for a short time. He feels well now and has no new concerns.  DC scheduled for tomorrow.    ROS: +migraines (chronic). +insomnia. +constipation. Denies fevers, chills, CP, SOB, abd pain, N/V/D,  new/worsening numbness, dizziness, focal weakness, or any other complaints at this time.  Constipation- improved after BM yesterday Objective:   No results found. No results for input(s): "WBC", "HGB", "HCT", "PLT" in the last 72 hours.  Recent Labs    11/29/22 0514  NA 138  K 4.4  CL 102  CO2 26  GLUCOSE 116*  BUN 7  CREATININE 1.04  CALCIUM 9.2     Intake/Output Summary (Last 24 hours) at 11/29/2022 0831 Last data filed at 11/29/2022 0748 Gross per 24 hour  Intake 777 ml  Output 550 ml  Net 227 ml         Physical Exam: Vital Signs Blood pressure 114/70, pulse 62, temperature 98.1 F (36.7 C), resp. rate 16, height 6' (1.829 m), weight (!) 167.7 kg, SpO2 97 %.  Constitutional:      General: He is not in acute distress. Lying in bed    Appearance: He is obese.  HENT: [place]/AT, MMM Eyes: conjugate gaze Cardiovascular: RRR, nl s1/s2, no m/r/g Pulmonary: CTAB, no w/r/r, normal effort, no distress Abdominal: soft, obese but nondistended, nonTTP, no r/g/r, +BS throughout Musculoskeletal:        General: No swelling. Normal range of motion.     Cervical back: Normal range of motion.     Comments: Trace LE edema  Skin:    General: Skin is warm and dry. No breakdown noted    Comments: Chest incision CDI.  Neurological:     Mental Status: He is alert.     Comments: Alert and awake. Follows commands. Normal insight and awareness. Intact Memory. Normal language and speech. Cranial nerve exam unremarkable.  UE motor 4+ to 5/5. LE 4/5 prox to 5/5 distal. Sensory exam normal for light touch and pain in all 4 limbs. No limb ataxia or cerebellar signs. No abnormal tone appreciated.    Psychiatric:        Mood and Affect: Mood normal.        Behavior: Behavior normal.  pleasant   Assessment/Plan: 1. Functional deficits which require 3+ hours per day of interdisciplinary therapy in a comprehensive inpatient rehab setting. Physiatrist is providing close team supervision and 24 hour management of active medical problems listed below. Physiatrist and rehab team continue to assess barriers to discharge/monitor patient progress toward functional and medical goals  Care Tool:  Bathing    Body parts bathed by patient: Right arm, Left arm, Chest, Right upper leg, Left upper leg, Face   Body parts bathed by helper: Abdomen, Front perineal area, Buttocks, Right lower leg, Left lower leg     Bathing assist Assist Level: Moderate Assistance - Patient 50 - 74%     Upper Body Dressing/Undressing Upper body dressing   What  is the patient wearing?: Hospital gown only    Upper body assist Assist Level: Supervision/Verbal cueing    Lower Body Dressing/Undressing Lower body dressing      What is the patient wearing?: Underwear/pull up, Pants     Lower body assist Assist for lower body dressing: Minimal Assistance - Patient > 75%     Toileting Toileting Toileting Activity did not occur (Clothing management and hygiene only): N/A (no void or bm)  Toileting assist Assist for toileting: Minimal Assistance - Patient > 75%     Transfers Chair/bed transfer  Transfers assist     Chair/bed transfer assist level: Supervision/Verbal cueing     Locomotion Ambulation   Ambulation assist      Assist level: Supervision/Verbal cueing Assistive device: Rollator Max distance: 394f   Walk 10 feet activity   Assist     Assist level: Supervision/Verbal cueing Assistive device: Rollator    Walk 50 feet activity   Assist    Assist level: Supervision/Verbal cueing Assistive device: Rollator    Walk 150 feet activity   Assist    Assist level: Supervision/Verbal cueing Assistive device: Rollator    Walk 10 feet on uneven surface  activity   Assist Walk 10 feet on uneven surfaces activity did not occur: Safety/medical concerns         Wheelchair     Assist Is the patient using a wheelchair?: No Type of Wheelchair: Manual    Wheelchair assist level: Dependent - Patient 0% Max wheelchair distance: 200 ft    Wheelchair 50 feet with 2 turns activity    Assist        Assist Level: Dependent - Patient 0%   Wheelchair 150 feet activity     Assist      Assist Level: Dependent - Patient 0%   Blood pressure 114/70, pulse 62, temperature 98.1 F (36.7 C), resp. rate 16, height 6' (1.829 m), weight (!) 167.7 kg, SpO2 97 %.  Medical Problem List and Plan: 1. Functional deficits secondary to debility after MI/CABG and associated medical complications             -patient may  shower             -ELOS/Goals: 1/26, supervision goals with PT, OT  -DC tomorrow    2.  Antithrombotics: -DVT/anticoagulation:  Pharmaceutical: Lovenox '40mg'$  QD             -antiplatelet therapy: Plavix '75mg'$  QD and aspirin 81 mg daily   3. Pain Management: Tylenol and oxycodone PRN   4. Mood/Behavior/Sleep: LCSW to evaluate and provide emotional support             -anxiety disorder: Continue Celexa 40 mg and Klonopin 0.5 mg BID             -antipsychotic agents: n/a -11/24/22 ordered Melatonin 5-'10mg'$  QHS PRN for sleep/insomnia--reminded him he can ask for this   5. Neuropsych/cognition: This patient is capable of making decisions on his own behalf.   6. Skin/Wound Care: Routine skin care checks              7. Fluids/Electrolytes/Nutrition: routine Is and Os and follow-up chemistries (weekly BMP starting 11/26/22) -11/24/22 Na 132, Cl 95 fairly stable;  Albumin low 2.9, AST 49/ALT 48 mildly elevated--likely transient transaminitis vs fatty liver-- consider recheck of LFTs this week  -LFTs improved   9: GERD: continue Protonix 80 mg    10: Migraine headache: worse since hospitalization -at home uses tylenol or  Roxicet 5/325 depending upon severity -uses Ubrelvy as needed as well with middling results--not on formulary per patient             -Ajovy monthly  -1/23 reports APAP, Oxycodone 2.'5mg'$ , percocet 5 helping in pain, he says he uses this combination at home  Reports HA is at baseline, medications helping   11: CAD/ICM/STEMI: s/p CABG times one vessel 11/13/22, POD #12             -continue sternal precautions   12: OSA on CPAP             -continue HS with oxygen  -Complaint with CPAP  13: Volume overload: continue Lasix 40 mg daily; follow-up BMP             -daily weights             -Lower extremity edema improved -11/24/22 mild hyponatremia/hypochloremia but stable (Na 132, Cl 95); monitor on weekly labs Wt stable, continue to monitor Filed Weights   11/27/22 0451 11/28/22 0437 11/29/22 0500  Weight: (!) 167.3 kg (!) 167.7 kg (!) 167.7 kg    14: Pneumonia, possible: finished antibiotics, no fever or cough             -O2 via [place] prn, FV   15: Class 3 obesity: BMI = 49.42, diet counseling would be beneficial   16: Seizure disorder:             -Klonopin 0.25-0.5 mg BID             -Lamictal 200 mg q AM and q HS             -Keppra XR 1000 mg BID             -Fycompa 8 mg daily  -1/25 reports brief seizure today, continue antiepileptic medications and f/u with neurology as outpatient   17: Hyperlipidemia: Crestor 40 mg daily   18: Tobacco use: cessation counseling   19: Hypertension: monitor BP TID and prn             -continue metoprolol 25 mg BID             -home lisinopril not restarted   -BP continues to be soft, decrease metoprolol dose to 12.'5mg'$  BID  1/25 BP well controlled, continue current  medications Vitals:   11/25/22 1956 11/26/22 0304 11/26/22 1320 11/26/22 2159  BP: (!) 103/38 122/69 122/68 (!) 106/52   11/26/22 2159 11/27/22 0451 11/27/22 1455 11/27/22 1959  BP: (!) 106/52 (!) 101/52 102/60 (!) 111/55   11/28/22 0437 11/28/22 1342 11/28/22 1939 11/29/22 0421  BP: 117/67 119/70 107/66 114/70      20: Left hip pain/DJD: surgery pending weight loss; follow-up outpatient   21: Pre-diabetes: Hgb A1C 6.1 on 11/10/22, no meds currently  22: Constipation: current regimen Colace '200mg'$  QD, MoM PRN, Sorbitol PRN, Fleet PRN  -11/24/22 LBM 2 days ago, ordered Miralax 17g BID, monitor for effect  -11/25/22 BM yesterday, continue current regimen  -1/22 Pt to try miralax first, discussed sorbitol PRN available  -1/25 improved after BM yesterday  LOS: 6 days A FACE TO FACE EVALUATION WAS PERFORMED  Jennye Boroughs 11/29/2022, 8:31 AM

## 2022-11-29 NOTE — Progress Notes (Signed)
Patient reports he did have a seizure this morning. Patient stable, vitals WNL. Notified MD, MD made face to face visit.    Yehuda Mao, LPN

## 2022-11-29 NOTE — Progress Notes (Signed)
Occupational Therapy Session Note  Patient Details  Name: James Torres MRN: [id number] Date of Birth: [birth year]  Today's Date: 11/29/2022 OT Individual Time:- 1445-1530  Short Term Goals: Week 1:  OT Short Term Goal 1 (Week 1): LTG=STG Supervision / setup with ADLs and mod I for transfers  Skilled Therapeutic Interventions/Progress Updates:   Pt seen for final OT session at [facility]. Pt agreeable to all tasks and open to completing shower retraining with focus on energy conservation and balance progression. Pt was able to use rollator to access shower stall and transfer to and from TTB in stall with mod I. Doffed all clothing with unilateral grab bar support as needed. Sat for most of shower with use of LH sponge with brief standing for peri/buttocks hygiene and drying all with mod I. Pt amb to EOB to re-dress and was able to complete within functioanl time with mod I. HR remained at baseline and RR no >18-20. Pt settled back into recliner and was able to verbalize all sternal precautions and safety measures especially with rollator brake mngt. Pt left with chair pad alarm active, needs and nurse call button in reach. No further skilled OT at [facility] needed.     Therapy Documentation Precautions:  Precautions Precautions: Fall, Sternal, Other (comment) Precaution Comments: h/o partial petit mal seizures (per chart, pt typically knows when they are about to occur) Restrictions Weight Bearing Restrictions: No RUE Weight Bearing: Partial weight bearing LUE Weight Bearing: Partial weight bearing    Therapy/Group: Individual Therapy  Barnabas Lister 11/29/2022, 7:52 AM

## 2022-11-29 NOTE — Progress Notes (Signed)
Physical Therapy Session Note  Patient Details  Name: James Torres MRN: [id number] Date of Birth: 10/08/1966  Today's Date: 11/29/2022 PT Individual Time: 1108-1205 PT Individual Time Calculation (min): 57 min   Short Term Goals: Week 1:  PT Short Term Goal 1 (Week 1): STG = LTG d/t ELOS  Skilled Therapeutic Interventions/Progress Updates: Pt presented sitting EOB with fiancee present agreeable to therapy. During session pt indicated had seizure just prior to PT session with oncoming migraine beginning. Rest breaks, low light environmental changes and rest provided during session. Session focused on family education with fiancee. Discussed pt currently at goal level (supervision to mod I), fiancee voiced some concerns regarding car transfers and bed mobility. Per fiancee have purchased wedges (for both HOB and laterally), discussed pt is able to get in/out bed at lower and higher bed levels and was able to demonstrate in previous session use of gait belt (leg strap) if too fatigued to place legs onto bed. Also discussed pt is able to get up from lower surfaces including standard chairs, recliners, and lower beds. Pt then stood from lowered bed and ambulated with supervision fading to mod I to ortho gym and car simulator. Simulator then elevated to 4 runner level however after discussion pt has more hip limitations in 4 runner than [place]. Simulator then lowered to [place] height and pt was able to perform with supervision and increased time. Pt was able to stand from 19in car height without assist. Due to increasing HA pt ambulated back to room mod I with rollator and participated in ambulation on uneven surfaces and picking up objects at supervision to mod I level respectively. Pt and fiancee pleased with progress and all questions/concerns answered to satisfaction. Pt left sitting EOB at end of session with current needs met and nsg present to administer meds.      Therapy Documentation Precautions:   Precautions Precautions: Fall, Sternal, Other (comment) Precaution Comments: h/o partial petit mal seizures (per chart, pt typically knows when they are about to occur) Restrictions Weight Bearing Restrictions: No RUE Weight Bearing: Partial weight bearing RUE Partial Weight Bearing Percentage or Pounds: sternal precautions LUE Weight Bearing: Partial weight bearing LUE Partial Weight Bearing Percentage or Pounds: sternal precautions Other Position/Activity Restrictions: sternal precautions General:   Vital Signs:   Pain: Pain Assessment Pain Score: 4  Pain Type: Chronic pain Pain Location: Head Pain Descriptors / Indicators: Headache Mobility: Bed Mobility Bed Mobility: Rolling Left;Left Sidelying to Sit;Sit to Sidelying Left;Scooting to [facility] Rolling Left: Independent Left Sidelying to Sit: Independent Sit to Sidelying Left: Independent Scooting to HOB: Independent Transfers Transfers: Sit to Stand;Stand to [employer] Transfers Sit to Stand: Independent with assistive device Stand to Sit: Independent with assistive device Stand Pivot Transfers: Independent with assistive device Transfer (Assistive device): Rollator Locomotion : Gait Ambulation: Yes Gait Assistance: Supervision/Verbal cueing Gait Distance (Feet): 150 Feet Assistive device: Rollator Gait Gait: Yes  Trunk/Postural Assessment : Cervical Assessment Cervical Assessment: Within Functional Limits Thoracic Assessment Thoracic Assessment: Within Functional Limits Lumbar Assessment Lumbar Assessment: Within Functional Limits Postural Control Postural Control: Within Functional Limits  Balance: Balance Balance Assessed: Yes Dynamic Sitting Balance Dynamic Sitting - Balance Support: Feet supported Dynamic Sitting - Level of Assistance: 6: Modified independent (Device/Increase time) Static Standing Balance Static Standing - Balance Support: During functional activity;Bilateral upper extremity  supported Static Standing - Level of Assistance: 6: Modified independent (Device/Increase time) Dynamic Standing Balance Dynamic Standing - Balance Support: No upper extremity supported;Bilateral upper extremity supported  Dynamic Standing - Level of Assistance: 6: Modified independent (Device/Increase time) Exercises:   Other Treatments:      Therapy/Group: Individual Therapy  Tawonna Esquer 11/29/2022, 1:02 PM

## 2022-11-29 NOTE — Progress Notes (Addendum)
Physical Therapy Discharge Summary  Patient Details  Name: James Torres MRN: [id number] Date of Birth: Apr 03, 1966  Date of Discharge from PT service:November 29, 2022  Today's Date: 11/29/2022    Patient has met 9 of 9 long term goals due to improved activity tolerance, increased strength, and decreased pain.  Patient to discharge at an ambulatory Mod I level for transfers with rollator and  Supervision for ambulation with rollator and stair navigation.   Patient's care partner is independent to provide the necessary physical assistance at discharge.  Reasons goals not met: N/A all goals met  Recommendation:  Patient will benefit from ongoing skilled PT services in home health setting to continue to advance safe functional mobility progressing to prior independence with ambulation, address ongoing impairments in balance, ambulation endurance, activity tolerance, and minimize fall risk.  Equipment: Rollator  Reasons for discharge: treatment goals met and discharge from hospital  Patient/family agrees with progress made and goals achieved: Yes  PT Discharge Precautions/Restrictions Precautions Precautions: Fall;Sternal;Other (comment) Precaution Comments: h/o partial petit mal seizures (per chart, pt typically knows when they are about to occur) Restrictions Weight Bearing Restrictions: No RUE Weight Bearing: Partial weight bearing RUE Partial Weight Bearing Percentage or Pounds: sternal precautions LUE Weight Bearing: Partial weight bearing LUE Partial Weight Bearing Percentage or Pounds: sternal precautions Other Position/Activity Restrictions: sternal precautions Vital Signs   Pain Pain Assessment Pain Score: 4  Pain Type: Chronic pain Pain Location: Head Pain Descriptors / Indicators: Headache Pain Interference Pain Interference Pain Effect on Sleep: 2. Occasionally Pain Interference with Therapy Activities: 2. Occasionally Pain Interference with Day-to-Day  Activities: 2. Occasionally Vision/Perception  Vision - History Ability to See in Adequate Light: 0 Adequate Perception Perception: Within Functional Limits Praxis Praxis: Intact  Cognition Overall Cognitive Status: Within Functional Limits for tasks assessed Arousal/Alertness: Awake/alert Orientation Level: Oriented X4 Year: 2024 Month: January Day of Week: Correct Sensation Sensation Light Touch: Appears Intact Hot/Cold: Appears Intact Proprioception: Appears Intact Coordination Gross Motor Movements are Fluid and Coordinated: Yes Fine Motor Movements are Fluid and Coordinated: Yes Motor  Motor Motor: Within Functional Limits  Mobility Bed Mobility Bed Mobility: Rolling Left;Left Sidelying to Sit;Sit to Sidelying Left;Scooting to [facility] Rolling Left: Independent Left Sidelying to Sit: Independent Sit to Sidelying Left: Independent Scooting to HOB: Independent Transfers Transfers: Sit to Stand;Stand to [employer] Transfers Sit to Stand: Independent with assistive device Stand to Sit: Independent with assistive device Stand Pivot Transfers: Independent with assistive device Transfer (Assistive device): Rollator Locomotion  Gait Ambulation: Yes Gait Assistance: Supervision/Verbal cueing Gait Distance (Feet): 150 Feet Assistive device: Rollator Gait Gait: Yes Pick up small object from the floor assist level: Supervision/Verbal cueing  Trunk/Postural Assessment  Cervical Assessment Cervical Assessment: Within Functional Limits Thoracic Assessment Thoracic Assessment: Within Functional Limits Lumbar Assessment Lumbar Assessment: Within Functional Limits Postural Control Postural Control: Within Functional Limits  Balance Balance Balance Assessed: Yes Dynamic Sitting Balance Dynamic Sitting - Balance Support: Feet supported Dynamic Sitting - Level of Assistance: 6: Modified independent (Device/Increase time) Static Standing Balance Static Standing -  Balance Support: During functional activity;Bilateral upper extremity supported Static Standing - Level of Assistance: 6: Modified independent (Device/Increase time) Dynamic Standing Balance Dynamic Standing - Balance Support: No upper extremity supported;Bilateral upper extremity supported Dynamic Standing - Level of Assistance: 6: Modified independent (Device/Increase time) Extremity Assessment  RUE Assessment RUE Assessment: Within Functional Limits LUE Assessment LUE Assessment: Within Functional Limits RLE Assessment RLE Assessment: Within Functional Limits General Strength Comments: grossly 4+/  5 prox to distal LLE Assessment LLE Assessment: Exceptions to [facility] General Strength Comments: grossly 4/5 proximal to distal   Marquette Old 11/29/2022, 2:08 PM

## 2022-11-29 NOTE — Progress Notes (Signed)
Occupational Therapy Session Note  Patient Details  Name: James Torres MRN: [id number] Date of Birth: [birth year]  Today's Date: 11/29/2022 OT Individual Time: 0930-1030 OT Individual Time Calculation (min): 60 min    Short Term Goals: Week 1:  OT Short Term Goal 1 (Week 1): LTG=STG Supervision / setup with ADLs and mod I for transfers  Skilled Therapeutic Interventions/Progress Updates:    Family education session completed with pt and his fiance James Torres. Verbal education provided re fall risk reduction, energy conservation strategies, home carryover of transfer training, ADLs, and IADLs. Demonstration and hands on training completed for pt performance of UB/LB bathing and dressing at mod I level, toileting hygiene and transfers, and shower transfers. Discussed equipment needs and worked with pt and James Torres on looking through options on [place] and then sent email with links. He completed 100 ft throughout session at mod I level using the rollator. He also completed bed transfer in the apt with normal bed and using gait belt to manage BLE. He was able to complete this with mod I. He was left sitting EOB with all needs met.    Therapy Documentation Precautions:  Precautions Precautions: Fall, Sternal, Other (comment) Precaution Comments: h/o partial petit mal seizures (per chart, pt typically knows when they are about to occur) Restrictions Weight Bearing Restrictions: No RUE Weight Bearing: Partial weight bearing LUE Weight Bearing: Partial weight bearing  Therapy/Group: Individual Therapy  [name] 11/29/2022, 8:58 AM

## 2022-11-29 NOTE — Progress Notes (Signed)
Occupational Therapy Discharge Summary  Patient Details  Name: James Torres MRN: [id number] Date of Birth: February 02, 1966  Date of Discharge from OT service:November 29, 2022    Patient has met 8 of 8 long term goals due to improved activity tolerance, improved balance, postural control, ability to compensate for deficits, and improved coordination.  Patient to discharge at overall Modified Independent level.  Patient's care partner is independent to provide the necessary physical assistance at discharge.  James Torres has made excellent progress in CIR and quickly progressed to a mod I level with mobility and ADLs. He will benefit from continued OT services via [facility] to continue challenging higher level dynamic balance, endurance, and engagement in IADLs.   Recommendation:  Patient will benefit from ongoing skilled OT services in home health setting to continue to advance functional skills in the area of BADL and iADL.  Equipment: TTB recommended  Reasons for discharge: treatment goals met  Patient/family agrees with progress made and goals achieved: Yes  OT Discharge Precautions/Restrictions  Precautions Precautions: Fall;Sternal;Other (comment) Precaution Comments: h/o partial petit mal seizures (per chart, pt typically knows when they are about to occur) Restrictions Weight Bearing Restrictions: No General   Vital Signs Therapy Vitals Temp: 98.1 F (36.7 C) Pulse Rate: 62 Resp: 16 BP: 114/70 Patient Position (if appropriate): Lying Oxygen Therapy SpO2: 97 % O2 Device: Room Air Pain   ADL ADL Eating: Independent Where Assessed-Eating: Chair Grooming: Modified independent Where Assessed-Grooming: Standing at sink Upper Body Bathing: Modified independent Where Assessed-Upper Body Bathing: Shower Lower Body Bathing: Modified independent Where Assessed-Lower Body Bathing: Shower Upper Body Dressing: Modified independent (Device) Where Assessed-Upper Body Dressing: Edge of  bed Lower Body Dressing: Modified independent Where Assessed-Lower Body Dressing: Edge of bed Toileting: Modified independent Where Assessed-Toileting: Glass blower/designer: Diplomatic Services operational officer Method: Human resources officer: Modified independent Clinical cytogeneticist Method: Optometrist: Facilities manager: Curator Method: Heritage manager: Civil engineer, contracting with back, Gaffer Baseline Vision/History: 1 Wears glasses Patient Visual Report: No change from baseline Vision Assessment?: No apparent visual deficits Perception  Perception: Within Functional Limits Praxis Praxis: Intact Cognition Cognition Overall Cognitive Status: Within Functional Limits for tasks assessed Arousal/Alertness: Awake/alert Orientation Level: Person;Place;Situation Person: Oriented Place: Oriented Situation: Oriented Memory: Appears intact Selective Attention: Appears intact Awareness: Appears intact Problem Solving: Appears intact Safety/Judgment: Appears intact Brief Interview for Mental Status (BIMS) Repetition of Three Words (First Attempt): 3 Temporal Orientation: Year: Correct Temporal Orientation: Month: Accurate within 5 days Temporal Orientation: Day: Correct Recall: "Sock": Yes, no cue required Recall: "Blue": Yes, no cue required Recall: "Bed": Yes, no cue required BIMS Summary Score: 15 Sensation Sensation Light Touch: Appears Intact Hot/Cold: Appears Intact Proprioception: Appears Intact Coordination Gross Motor Movements are Fluid and Coordinated: Yes Fine Motor Movements are Fluid and Coordinated: Yes Motor  Motor Motor: Within Functional Limits Mobility  Bed Mobility Bed Mobility: Rolling Left;Left Sidelying to Sit;Sit to Sidelying Left;Scooting to [facility] Rolling Left: Independent Left Sidelying to Sit: Independent Sit to Sidelying Left:  Independent Scooting to HOB: Independent Transfers Sit to Stand: Independent with assistive device Stand to Sit: Independent with assistive device  Trunk/Postural Assessment  Cervical Assessment Cervical Assessment: Within Functional Limits Thoracic Assessment Thoracic Assessment: Within Functional Limits Lumbar Assessment Lumbar Assessment: Within Functional Limits Postural Control Postural Control: Within Functional Limits  Balance Balance Balance Assessed: Yes Dynamic Sitting Balance Dynamic Sitting - Balance Support: Feet supported Dynamic Sitting - Level  of Assistance: 6: Modified independent (Device/Increase time) Static Standing Balance Static Standing - Balance Support: During functional activity;Bilateral upper extremity supported Static Standing - Level of Assistance: 6: Modified independent (Device/Increase time) Dynamic Standing Balance Dynamic Standing - Balance Support: No upper extremity supported;Bilateral upper extremity supported Dynamic Standing - Level of Assistance: 6: Modified independent (Device/Increase time) Extremity/Trunk Assessment RUE Assessment RUE Assessment: Within Functional Limits LUE Assessment LUE Assessment: Within Functional Limits   [name] 11/29/2022, 7:36 AM

## 2022-11-30 ENCOUNTER — Ambulatory Visit (INDEPENDENT_AMBULATORY_CARE_PROVIDER_SITE_OTHER): Payer: Self-pay | Admitting: Thoracic Surgery (Cardiothoracic Vascular Surgery)

## 2022-11-30 ENCOUNTER — Telehealth (HOSPITAL_COMMUNITY): Payer: Self-pay

## 2022-11-30 DIAGNOSIS — K5900 Constipation, unspecified: Secondary | ICD-10-CM

## 2022-11-30 DIAGNOSIS — Z951 Presence of aortocoronary bypass graft: Secondary | ICD-10-CM

## 2022-11-30 NOTE — Telephone Encounter (Signed)
Pt insurance is active and benefits verified through [employer] $25.00, DED $500.00/$0.00 met, out of pocket $8,850.00/$0.00 met, co-insurance 0%. No pre-authorization required. Passport, 11/30/22 @ 4:00PM, WUJ#[id number]   How many CR sessions are covered? (36 sessions for TCR, 72 sessions for ICR)72 Is this a lifetime maximum or an annual maximum? Lifetime Has the member used any of these services to date? No Is there a time limit (weeks/months) on start of program and/or program completion? No     Will contact patient to see if he is interested in the Cardiac Rehab Program. If interested, patient will need to complete follow up appt. Once completed, patient will be contacted for scheduling upon review by the RN Navigator.

## 2022-11-30 NOTE — Progress Notes (Signed)
Inpatient Rehabilitation Care Coordinator Discharge Note   Patient Details  Name: James Torres MRN: [id number] Date of Birth: May 07, 1966   Discharge location: HOME WITH GIRLFRIEND WHO WORKS FROM HOME  Length of Stay: 7 DAYS  Discharge activity level: MOD/I-SUPERVISION LEVEL  Home/community participation: ACTIVE  Patient response LY:HTMBPJ Literacy - How often do you need to have someone help you when you read instructions, pamphlets, or other written material from your doctor or pharmacy?: Always  Patient response PE:TKKOEC Isolation - How often do you feel lonely or isolated from those around you?: Rarely  Services provided included: MD, RD, PT, OT, RN, CM, TR, Pharmacy, SW  Financial Services:  Charity fundraiser Utilized: Environmental education officer MEDICARE  Choices offered to/list presented to: PT  Follow-up services arranged:  Home Health, DME, Patient/Family has no preference for HH/DME agencies [facility]-PT [facility]    DME : [facility]    Patient response to transportation need: Is the patient able to respond to transportation needs?: Yes In the past 12 months, has lack of transportation kept you from medical appointments or from getting medications?: No In the past 12 months, has lack of transportation kept you from meetings, work, or from getting things needed for daily living?: No    Comments (or additional information): PT FEELS READY FOR DISCHARGE, GIRLFRIEND WAS IN YESTERDAY FOR HANDS ON EDUCATION. BOTH FEEL COMFORTABLE REGARDING CARE NEEDS.  Patient/Family verbalized understanding of follow-up arrangements:  Yes  Individual responsible for coordination of the follow-up plan: ROBIN-GIRLFRIEND [phone]  Confirmed correct DME delivered: Elease Hashimoto 11/30/2022    Chi Woodham, Gardiner Rhyme

## 2022-11-30 NOTE — Progress Notes (Signed)
Inpatient Rehabilitation Discharge Medication Review by a Pharmacist  A complete drug regimen review was completed for this patient to identify any potential clinically significant medication issues.  High Risk Drug Classes Is patient taking? Indication by Medication  Antipsychotic No   Anticoagulant No   Antibiotic No   Opioid Yes Percocet - PRN pain  Antiplatelet Yes Aspirin, clopidogrel - PCI/NSTEMI  Hypoglycemics/insulin No   Vasoactive Medication Yes Furosemide - edema, CHF  Metoprolol - HTN  Chemotherapy No   Other Yes Docusate, Miralax - PRN constipation  Ajovy - migraine ppx Citalopram, clonazepam - mood Famotidine, pantoprazole - GERD ppx Levetiracetam, lamictal, perampanel - seizure ppx Rosuvastatin - HLD     Type of Medication Issue Identified Description of Issue Recommendation(s)  Drug Interaction(s) (clinically significant)     Duplicate Therapy     Allergy     No Medication Administration End Date     Incorrect Dose     Additional Drug Therapy Needed     Significant med changes from prior encounter (inform family/care partners about these prior to discharge). Carvedilol changed to metoprolol  Ibuprofen, lisinopril, ubrelvy, nitroglycerin stopped at discharge Communicate medication changes with patient/family at discharge  Other       Clinically significant medication issues were identified that warrant physician communication and completion of prescribed/recommended actions by midnight of the next day:  No  Pharmacist comments: n/a   Time spent performing this drug regimen review (minutes): 20   Thank you for allowing pharmacy to be a part of this patient's care.  Ardyth Harps, PharmD Clinical Pharmacist

## 2022-11-30 NOTE — Progress Notes (Signed)
[address]       [place] [address]             [phone]       Patient: Home Provider: Office Consent for Telemedicine visit obtained.  Today's visit was completed via a real-time telehealth (see specific modality noted below). The patient/authorized person provided oral consent at the time of the visit to engage in a telemedicine encounter with the present provider at [facility]. The patient/authorized person was informed of the potential benefits, limitations, and risks of telemedicine. The patient/authorized person expressed understanding that the laws that protect confidentiality also apply to telemedicine. The patient/authorized person acknowledged understanding that telemedicine does not provide emergency services and that he or she would need to call 911 or proceed to the nearest hospital for help if such a need arose.   Total time spent in the clinical discussion 10 minutes.  Telehealth Modality: Phone visit (audio only)  I had a telephone visit with  James Torres who is s/p CABG.  He was discharged from inpatient rehab today.  Overall doing well.  Pain is minimal.  Ambulating well. Vitals have been stable.  James Torres will see [place] back in 1 month with a chest x-ray for cardiac rehab clearance.  [name] Bary Leriche

## 2022-11-30 NOTE — Progress Notes (Signed)
PROGRESS NOTE   Subjective/Complaints: No new concerns this AM. He is looking forward to going home. His wife is more reassured about his abilities after seeing him in therapy yesterday.    ROS: +migraines - had severe migraine yesterday but this has improved. +insomnia. +constipation. Denies fevers, chills, CP, SOB, abd pain, N/V/D,  new/worsening numbness, dizziness, focal weakness, or any other complaints at this time.  Constipation- improved Objective:   No results found. No results for input(s): "WBC", "HGB", "HCT", "PLT" in the last 72 hours.  Recent Labs    11/29/22 0514  NA 138  K 4.4  CL 102  CO2 26  GLUCOSE 116*  BUN 7  CREATININE 1.04  CALCIUM 9.2     Intake/Output Summary (Last 24 hours) at 11/30/2022 0831 Last data filed at 11/29/2022 1852 Gross per 24 hour  Intake 240 ml  Output --  Net 240 ml         Physical Exam: Vital Signs Blood pressure 105/61, pulse 64, temperature 98.5 F (36.9 C), temperature source Oral, resp. rate 18, height 6' (1.829 m), weight (!) 151.2 kg, SpO2 100 %.  Constitutional:      General: He is not in acute distress. Lying in bed    Appearance: He is obese.  HENT: [place]/AT, MMM Eyes: conjugate gaze, PERRLA Cardiovascular: RRR, nl s1/s2, no m/r/g Pulmonary: CTAB, no w/r/r, normal effort, no distress Abdominal: soft, obese but nondistended, nonTTP, no r/g/r, +BS throughout Musculoskeletal:        General: No joint swelling noted. Normal range of motion.     Cervical back: Normal range of motion.     Comments: Trace LE edema  Skin:    General: Skin is warm and dry. No breakdown noted    Comments: Chest incision CDI.  Neurological:     Mental Status: He is alert.     Comments: Alert and awake. Follows commands. Normal insight and awareness. Intact Memory. Normal language and speech. Cranial nerve exam unremarkable. UE motor 4+ to 5/5. LE 4/5 prox to 5/5 distal. Sensory  exam normal for light touch and pain in all 4 limbs. No limb ataxia or cerebellar signs. No abnormal tone appreciated.    Psychiatric:        Mood and Affect: Mood normal.        Behavior: Behavior normal.  pleasant   Assessment/Plan: 1. Functional deficits which require 3+ hours per day of interdisciplinary therapy in a comprehensive inpatient rehab setting. Physiatrist is providing close team supervision and 24 hour management of active medical problems listed below. Physiatrist and rehab team continue to assess barriers to discharge/monitor patient progress toward functional and medical goals  Care Tool:  Bathing    Body parts bathed by patient: Right arm, Left arm, Chest, Right upper leg, Left upper leg, Face, Abdomen, Front perineal area, Buttocks, Right lower leg, Left lower leg   Body parts bathed by helper: Abdomen, Front perineal area, Buttocks, Right lower leg, Left lower leg     Bathing assist Assist Level: Independent with assistive device Assistive Device Comment: LH sponge on TTB   Upper Body Dressing/Undressing Upper body dressing   What is the patient wearing?:  Pull over shirt    Upper body assist Assist Level: Independent with assistive device    Lower Body Dressing/Undressing Lower body dressing      What is the patient wearing?: Underwear/pull up, Pants     Lower body assist Assist for lower body dressing: Independent with assitive device     Toileting Toileting Toileting Activity did not occur (Clothing management and hygiene only): N/A (no void or bm)  Toileting assist Assist for toileting: Independent with assistive device     Transfers Chair/bed transfer  Transfers assist     Chair/bed transfer assist level: Independent with assistive device Chair/bed transfer assistive device: Museum/gallery exhibitions officer assist      Assist level: Supervision/Verbal cueing Assistive device: Rollator Max distance: 126f   Walk 10  feet activity   Assist     Assist level: Supervision/Verbal cueing Assistive device: Rollator   Walk 50 feet activity   Assist    Assist level: Supervision/Verbal cueing Assistive device: Rollator    Walk 150 feet activity   Assist    Assist level: Supervision/Verbal cueing Assistive device: Rollator    Walk 10 feet on uneven surface  activity   Assist Walk 10 feet on uneven surfaces activity did not occur: Safety/medical concerns   Assist level: Supervision/Verbal cueing Assistive device: Rollator   Wheelchair     Assist Is the patient using a wheelchair?: No Type of Wheelchair: Manual    Wheelchair assist level: Dependent - Patient 0% Max wheelchair distance: 200 ft    Wheelchair 50 feet with 2 turns activity    Assist        Assist Level: Dependent - Patient 0%   Wheelchair 150 feet activity     Assist      Assist Level: Dependent - Patient 0%   Blood pressure 105/61, pulse 64, temperature 98.5 F (36.9 C), temperature source Oral, resp. rate 18, height 6' (1.829 m), weight (!) 151.2 kg, SpO2 100 %.  Medical Problem List and Plan: 1. Functional deficits secondary to debility after MI/CABG and associated medical complications             -patient may  shower             -ELOS/Goals: 1/26, supervision goals with PT, OT  -DC today    2.  Antithrombotics: -DVT/anticoagulation:  Pharmaceutical: Lovenox '40mg'$  QD             -antiplatelet therapy: Plavix '75mg'$  QD and aspirin 81 mg daily   3. Pain Management: Tylenol and oxycodone PRN   4. Mood/Behavior/Sleep: LCSW to evaluate and provide emotional support             -anxiety disorder: Continue Celexa 40 mg and Klonopin 0.5 mg BID             -antipsychotic agents: n/a -11/24/22 ordered Melatonin 5-'10mg'$  QHS PRN for sleep/insomnia--reminded him he can ask for this   5. Neuropsych/cognition: This patient is capable of making decisions on his own behalf.   6. Skin/Wound Care:  Routine skin care checks              7. Fluids/Electrolytes/Nutrition: routine Is and Os and follow-up chemistries (weekly BMP starting 11/26/22) -11/24/22 Na 132, Cl 95 fairly stable; Albumin low 2.9, AST 49/ALT 48 mildly elevated--likely transient transaminitis vs fatty liver-- consider recheck of LFTs this week  -LFTs improved   9: GERD: continue Protonix 80 mg    10: Migraine headache: worse since hospitalization -  at home uses tylenol or Roxicet 5/325 depending upon severity -uses Ubrelvy as needed as well with middling results--not on formulary per patient             -Ajovy monthly  -1/23 reports APAP, Oxycodone 2.'5mg'$ , percocet 5 helping in pain, he says he uses this combination at home  Improved today   11: CAD/ICM/STEMI: s/p CABG times one vessel 11/13/22, POD #12             -continue sternal precautions   12: OSA on CPAP             -continue HS with oxygen  -Complaint with CPAP  13: Volume overload: continue Lasix 40 mg daily; follow-up BMP             -daily weights             -Lower extremity edema improved -11/24/22 mild hyponatremia/hypochloremia but stable (Na 132, Cl 95); monitor on weekly labs Wt stable, continue to monitor -11/30/22 overall stable, suspect last weight was inaccurate  Filed Weights   11/28/22 0437 11/29/22 0500 11/30/22 0542  Weight: (!) 167.7 kg (!) 167.7 kg (!) 151.2 kg    14: Pneumonia, possible: finished antibiotics, no fever or cough             -O2 via [place] prn, FV   15: Class 3 obesity: BMI = 49.42, diet counseling would be beneficial   16: Seizure disorder:             -Klonopin 0.25-0.5 mg BID             -Lamictal 200 mg q AM and q HS             -Keppra XR 1000 mg BID             -Fycompa 8 mg daily  -1/25 reports brief seizure today, continue antiepileptic medications and f/u with neurology as outpatient   - No further seizures today, continue to f/u with neurology as outpatient  17: Hyperlipidemia: Crestor 40 mg daily   18:  Tobacco use: cessation counseling   19: Hypertension: monitor BP TID and prn             -continue metoprolol 25 mg BID             -home lisinopril not restarted   -BP continues to be soft, decrease metoprolol dose to 12.'5mg'$  BID  1/26 well controlled Vitals:   11/26/22 2159 11/27/22 0451 11/27/22 1455 11/27/22 1959  BP: (!) 106/52 (!) 101/52 102/60 (!) 111/55   11/28/22 0437 11/28/22 1342 11/28/22 1939 11/29/22 0421  BP: 117/67 119/70 107/66 114/70   11/29/22 0849 11/29/22 1442 11/29/22 1957 11/30/22 0542  BP: 110/64 109/65 107/65 105/61      20: Left hip pain/DJD: surgery pending weight loss; follow-up outpatient   21: Pre-diabetes: Hgb A1C 6.1 on 11/10/22, no meds currently  22: Constipation: current regimen Colace '200mg'$  QD, MoM PRN, Sorbitol PRN, Fleet PRN  -11/24/22 LBM 2 days ago, ordered Miralax 17g BID, monitor for effect  -11/25/22 BM yesterday, continue current regimen  -1/22 Pt to try miralax first, discussed sorbitol PRN available  -1/26 LBM yesterday, constipation improved  LOS: 7 days A FACE TO FACE EVALUATION WAS PERFORMED  Jennye Boroughs 11/30/2022, 8:31 AM

## 2022-12-04 DIAGNOSIS — G4733 Obstructive sleep apnea (adult) (pediatric): Secondary | ICD-10-CM | POA: Diagnosis not present

## 2022-12-04 DIAGNOSIS — Z951 Presence of aortocoronary bypass graft: Secondary | ICD-10-CM | POA: Diagnosis not present

## 2022-12-04 DIAGNOSIS — I5032 Chronic diastolic (congestive) heart failure: Secondary | ICD-10-CM | POA: Diagnosis not present

## 2022-12-04 DIAGNOSIS — E785 Hyperlipidemia, unspecified: Secondary | ICD-10-CM | POA: Diagnosis not present

## 2022-12-04 DIAGNOSIS — K219 Gastro-esophageal reflux disease without esophagitis: Secondary | ICD-10-CM | POA: Diagnosis not present

## 2022-12-04 DIAGNOSIS — K5900 Constipation, unspecified: Secondary | ICD-10-CM | POA: Diagnosis not present

## 2022-12-04 DIAGNOSIS — G40909 Epilepsy, unspecified, not intractable, without status epilepticus: Secondary | ICD-10-CM | POA: Diagnosis not present

## 2022-12-04 DIAGNOSIS — Z48812 Encounter for surgical aftercare following surgery on the circulatory system: Secondary | ICD-10-CM | POA: Diagnosis not present

## 2022-12-04 DIAGNOSIS — I110 Hypertensive heart disease with heart failure: Secondary | ICD-10-CM | POA: Diagnosis not present

## 2022-12-09 ENCOUNTER — Encounter (HOSPITAL_BASED_OUTPATIENT_CLINIC_OR_DEPARTMENT_OTHER): Payer: Self-pay | Admitting: Family

## 2022-12-09 NOTE — Progress Notes (Signed)
Cardiology Office Note:    Date:  12/10/2022   ID:  James Torres, DOB 09/10/1966, MRN [id number]  PCP:  Lawerance Cruel, [place] [facility] Cardiologist:  Buford Dresser, MD     Referring MD: Lawerance Cruel, MD   Chief Complaint  Patient presents with   follow up CABG    Seen for Dr. Harrell Gave    History of Present Illness:    James Torres is a 57 y.o. male with a hx of CAD (NSTEMI s/p PCI and stenting of the circumflex with a 38 mm long Synergy DES 2016; CABG x 1 LIMA to LAD 11/13/2022), HTN, HLD, ICM (03/2015 EF 35-45% with recovered EF of 50-55% following PCI -> subsequent chronic diastolic CHF), carotid artery disease, GERD, OSA on CPAP, s/p resection of epidermoid brain tumor, seizure d/o.  Presented to the ED on 11/10/22 with chest pain he took 2 nitroglycerin at home with relief of symptoms.  In the ED EKG showed mild ST segment depression in inferior leads and nonspecific ST T wave changes in the inferior leads.  Initial troponin 177 > 4400.  He was taken for a left heart cath which demonstrated 99% stenosis of the proximal LAD, not felt to be suitable for PCI.  He underwent CABG x 1, LIMA to LAD.  He was volume overloaded requiring diuresis, CPAP, HFNC 15 LPM, DC home with PT OT was initially recommended, however he was slow to progress and eventually the decision was made for inpatient rehab at [facility].  He was admitted to inpatient rehab on 11/23/2022 to 11/30/2022 and was eventually discharged home.  He had a follow-up telemedicine visit with [facility] physician Dr. [name] on 11/30/2022, he was doing well, pain was minimal.  He will follow-up with our office in a month for chest x-ray and cardiac rehab clearance.  He presents today accompanied by his fiance for his post hospitalization follow up. He is doing well, however he is fatigued. His hospitalization was complicated by debilitating migraines. He has continued to struggle with migraines, which has  prohibited him from returning to his basic activity level. He is requiring a rollator for ambulation, and will have home PT/OT twice a week that will start this week. He is sleeping in his recliner secondary to his sternotomy pain, using his CPAP for both naps and sleep at night. His fiance checks his BP and weight daily. We discussed cardiac rehab in the future after he sees [facility] again on 01/01/23, he advised that d/t his seizure d/o he cannot drive and it would be very difficult for his fiance to take him. He is also struggling with smoking cessation, he has not smoked since 11/10/22 and did not have any notable cravings during his admission, however since returning home the urge has been very strong. We discussed Chantix (this caused vivid dreams), Wellbutrin, nicotine patches as well as having Health Coach reach out for support. For now, he wants to continue to avoid smoking on his own accord. He denies chest pain, palpitations, dyspnea, pnd, orthopnea, n, v, dizziness, syncope, edema, weight gain, or early satiety. He does have some SOB after prolonged walking, this seems to be related to deconditioning.    Past Medical History:  Diagnosis Date   Anxiety    Brain tumor ([place])    CAD (coronary artery disease)    a. 03/2015 NSTEMI: LM nl, LAD 50ost, 100p (2.75x38 Synergy DES), 75d, RI nl, LCX minor irregs, OM1 min irregs, RCA  50ost, EF 35-45%.   Carotid artery disease ([place]) 11/2022   R - mild; L moderate   Depression    GERD (gastroesophageal reflux disease)    Hyperlipidemia    Ischemic cardiomyopathy    a. 03/2015 EF 35-45% by LV gram @ time of NSTEMI;  b. 03/2015 Echo: EF 50-55%, sev HK to DK of dist an, apical, and infap walls.Gr 1 DD.   Kidney stone    Metabolic syndrome 27/51/7001   Migraines    Morbid obesity (HCC)    Pre-diabetes    S/P CABG x 1 11/2022   LIMA > LAD   Seizures ([place])    a. Has auras and metalic taste in mouth.  Last one 06/2014- last a few seconds, has one every couple  weeks, Dr Mare Loan is neurologist and is aware.    Sleep apnea 11/06/2007   a. On CPAP.   Tobacco abuse     Past Surgical History:  Procedure Laterality Date   BRAIN SURGERY  2004   Crainotomy for tumor   CARDIAC CATHETERIZATION N/A 03/21/2015   Procedure: Left Heart Cath and Coronary Angiography;  Surgeon: Sherren Mocha, MD;  Location: [facility];  Service: Cardiovascular;  Laterality: N/A;   CORONARY ARTERY BYPASS GRAFT N/A 11/13/2022   Procedure: OFF PUMP CORONARY ARTERY BYPASS GRAFTING (CABG) X ONE BYPASS USING LEFT INTERNAL MAMMARY ARTERY.;  Surgeon: Lajuana Matte, MD;  Location: [facility];  Service: Open Heart Surgery;  Laterality: N/A;   KNEE ARTHROSCOPY Right 2005 approx   cartliage   LEFT HEART CATH AND CORONARY ANGIOGRAPHY N/A 11/12/2022   Procedure: LEFT HEART CATH AND CORONARY ANGIOGRAPHY;  Surgeon: Lorretta Harp, MD;  Location: [facility];  Service: Cardiovascular;  Laterality: N/A;   TEE WITHOUT CARDIOVERSION N/A 11/13/2022   Procedure: TRANSESOPHAGEAL ECHOCARDIOGRAM (TEE);  Surgeon: Lajuana Matte, MD;  Location: [facility];  Service: Open Heart Surgery;  Laterality: N/A;   ULNAR NERVE TRANSPOSITION Left 06/25/2014   Procedure: LEFT ULNAR NEUROPLASTY AT ELBOW;  Surgeon: Jolyn Nap, MD;  Location: [facility];  Service: Orthopedics;  Laterality: Left;   WISDOM TOOTH EXTRACTION      Current Medications: Current Meds  Medication Sig   acetaminophen (TYLENOL) 325 MG tablet Take 1-2 tablets (325-650 mg total) by mouth every 4 (four) hours as needed for mild pain.   aspirin EC 81 MG tablet Take 1 tablet (81 mg total) by mouth daily.   citalopram (CELEXA) 40 MG tablet Take 40 mg by mouth every morning.   clonazePAM (KLONOPIN) 0.5 MG tablet Take 0.25-0.5 mg by mouth 2 (two) times daily.   clopidogrel (PLAVIX) 75 MG tablet Take 1 tablet (75 mg total) by mouth daily.   docusate sodium (COLACE) 100 MG capsule Take 2 capsules (200 mg total) by mouth daily.    ezetimibe (ZETIA) 10 MG tablet Take 1 tablet (10 mg total) by mouth daily.   famotidine (PEPCID) 20 MG tablet Take 1 tablet (20 mg total) by mouth at bedtime.   Fremanezumab-vfrm (AJOVY) 225 MG/1.5ML SOAJ Inject 1 Dose into the skin every 30 (thirty) days.   lamoTRIgine (LAMICTAL) 100 MG tablet Take 200 mg by mouth in the morning and at bedtime.   levETIRAcetam (KEPPRA XR) 500 MG 24 hr tablet Take 1,000 mg by mouth 2 (two) times daily.   Melatonin 5 MG CHEW Chew 15-20 mg by mouth at bedtime.   oxyCODONE-acetaminophen (PERCOCET/ROXICET) 5-325 MG tablet Take 1 tablet by mouth every 8 (eight)  hours as needed for moderate pain (migraine headache).   pantoprazole (PROTONIX) 40 MG tablet Take 2 tablets (80 mg total) by mouth daily.   perampanel (FYCOMPA) 8 MG tablet Take 1 tablet by mouth daily.   polyethylene glycol powder (GLYCOLAX/MIRALAX) 17 GM/SCOOP powder Dissolve 1 capful (17 g) in water and take by mouth 2 (two) times daily   rosuvastatin (CRESTOR) 40 MG tablet Take 1 tablet (40 mg total) by mouth daily.   [DISCONTINUED] furosemide (LASIX) 40 MG tablet Take 1 tablet (40 mg total) by mouth daily.   [DISCONTINUED] metoprolol tartrate (LOPRESSOR) 25 MG tablet Take 0.5 tablets (12.5 mg total) by mouth 2 (two) times daily.     Allergies:   Zithromax [azithromycin] and Aspartame   Social History   Socioeconomic History   Marital status: Single    Spouse name: Not on file   Number of children: Not on file   Years of education: Not on file   Highest education level: Not on file  Occupational History   Not on file  Tobacco Use   Smoking status: Every Day    Packs/day: 0.50    Years: 20.00    Total pack years: 10.00    Types: Cigarettes   Smokeless tobacco: Never  Vaping Use   Vaping Use: Never used  Substance and Sexual Activity   Alcohol use: No    Alcohol/week: 0.0 standard drinks of alcohol   Drug use: No   Sexual activity: Not Currently  Other Topics Concern   Not on file   Social History Narrative   Not on file   Social Determinants of Health   Financial Resource Strain: Not on file  Food Insecurity: Not on file  Transportation Needs: Not on file  Physical Activity: Not on file  Stress: Not on file  Social Connections: Not on file     Family History: The patient's family history includes Healthy in his mother, sister, and sister; Heart disease in his father; Stroke in his father. There is no history of Heart attack.  ROS:   Please see the history of present illness.    All other systems reviewed and are negative.  EKGs/Labs/Other Studies Reviewed:    The following studies were reviewed today:  LEFT HEART CATH AND CORONARY ANGIOGRAPHY      Ost LAD to Prox LAD lesion is 99% stenosed.   Mid LAD lesion is 20% stenosed.   Ost RCA to Prox RCA lesion is 50% stenosed.  11/13/22 echo TEE (intraoperative) -EF 45 to 50%, trivial pericardial effusion in the anterior right ventricle, MR is mild, trivial TR  11/12/22 carotid ultrasound -right ICA consistent with 1 to 39% stenosis.  Left ICA are consistent with 45 to 59% stenosis.  EKG:  EKG is ordered today.  The ekg ordered today demonstrates normal sinus rhythm, heart rate 70 bpm, abnormal EKG, consistent with previous tracing.  Recent Labs: 11/10/2022: TSH 2.001 11/24/2022: Magnesium 2.3 11/26/2022: Hemoglobin 13.7; Platelets 287 11/29/2022: ALT 28; BUN 7; Creatinine, Ser 1.04; Potassium 4.4; Sodium 138  Recent Lipid Panel    Component Value Date/Time   CHOL 136 11/10/2022 0832   CHOL 129 09/02/2017 1006   TRIG 120 11/10/2022 0832   HDL 41 11/10/2022 0832   HDL 39 (L) 09/02/2017 1006   CHOLHDL 3.3 11/10/2022 0832   VLDL 24 11/10/2022 0832   LDLCALC 71 11/10/2022 0832   LDLCALC 66 09/02/2017 1006     Risk Assessment/Calculations:  Physical Exam:    VS:  BP 118/66   Pulse 70   Ht 6' (1.829 m)   Wt (!) 371 lb (168.3 kg)   BMI 50.32 kg/m     Wt Readings from Last 3  Encounters:  12/10/22 (!) 371 lb (168.3 kg)  11/30/22 (!) 333 lb 5.4 oz (151.2 kg)  11/23/22 (!) 364 lb 6.7 oz (165.3 kg)     GEN:  Well nourished, well developed in no acute distress HEENT: Normal NECK: No JVD; No carotid bruits LYMPHATICS: No lymphadenopathy CARDIAC: RRR, no murmurs, rubs, gallops RESPIRATORY:  Clear to auscultation without rales, wheezing or rhonchi  ABDOMEN: Soft, non-tender, non-distended MUSCULOSKELETAL:  trace pedal edema; No deformity  SKIN: Warm and dry, healing sternotomy and chest tube site -- edges approximated without erythema, edema, or tenderness NEUROLOGIC:  Alert and oriented x 3 PSYCHIATRIC:  Normal affect   ASSESSMENT:    1. S/P CABG x 1   2. Coronary artery disease involving native coronary artery of native heart without angina pectoris   3. Chronic diastolic heart failure ([place])   4. Mixed hyperlipidemia   5. Essential hypertension   6. Smoking trying to quit    PLAN:    In order of problems listed above:  Coronary artery disease/s/p CABG x 1 - Denies CP or acute decompensation. On GDMT ASA, plavix, metoprolol, rosuvastatin. Discussed cardiac rehab, this will be cleared by [facility], however he feels that transportation will be prohibitive. Home health PT/OT to start this week. Check CMC, BMET today.  Chronic diastolic HF - EF 11-94%, NYHA class II, euvolemic, on GDMT metoprolol, lasix  HTN - BP today 118/66, well controlled, continue metoprolol HLD - LDL 71 on 11/10/22 goal 55 or less, on rosuvastatin 40 mg daily, start Zetia 10 mg daily, repeat FLP in 6 weeks Smoking cessation - stopped smoking on 11/10/22, no urges until he recently d/c'd home. Discussed Wellbutrin, Chantix, nicotine patches as well as Health Coaching, ultimately for now he wants to try to avoid smoking on his own accord.   Disposition - check CBC, BMET today. Start Zetia. Return in 6 weeks for FLP. Return in 3 months with Dr. Harrell Gave.      Cardiac Rehabilitation  Eligibility Assessment            Medication Adjustments/Labs and Tests Ordered: Current medicines are reviewed at length with the patient today.  Concerns regarding medicines are outlined above.  Orders Placed This Encounter  Procedures   Basic metabolic panel   CBC With Differential   Lipid panel   EKG 12-Lead   Meds ordered this encounter  Medications   ezetimibe (ZETIA) 10 MG tablet    Sig: Take 1 tablet (10 mg total) by mouth daily.    Dispense:  90 tablet    Refill:  3   furosemide (LASIX) 40 MG tablet    Sig: Take 1 tablet (40 mg total) by mouth daily.    Dispense:  90 tablet    Refill:  3   metoprolol tartrate (LOPRESSOR) 25 MG tablet    Sig: Take 0.5 tablets (12.5 mg total) by mouth 2 (two) times daily.    Dispense:  90 tablet    Refill:  3    Patient Instructions  Medication Instructions:  Your physician has recommended you make the following change in your medication:   Start: Zetia '10mg'$  daily   We have refilled your Lasix and Metoprolol today!   *If you need a refill on your cardiac  medications before your next appointment, please call your pharmacy*   Lab Work: Your physician recommends that you return for lab work today- BMP, CBC   Please return for Lab work in 6 weeks for fasting Lipid Panel. You may come to the...   [facility] (3rd floor) [address] [place], [place] [address]  Open: 8am-Noon and 1pm-4:30pm  Please ring the doorbell on the small table when you exit the elevator and the Lab Tech will come get you  [place] [facility] [address] [place], [place], [place] [address] Open: 8am-1pm, then 2pm-4:30pm   [place]- Please see attached locations sheet stapled to your lab work with address and hours.   If you have labs (blood work) drawn today and your tests are completely normal, you will receive your results only by: [place] (if you have MyChart) OR A paper copy in the  mail If you have any lab test that is abnormal or we need to change your treatment, we will call you to review the results.  Follow-Up: At [facility], you and your health needs are our priority.  As part of our continuing mission to provide you with exceptional heart care, we have created designated Provider Care Teams.  These Care Teams include your primary Cardiologist (physician) and Advanced Practice Providers (APPs -  Physician Assistants and Nurse Practitioners) who all work together to provide you with the care you need, when you need it.  We recommend signing up for the patient portal called "MyChart".  Sign up information is provided on this After Visit Summary.  MyChart is used to connect with patients for Virtual Visits (Telemedicine).  Patients are able to view lab/test results, encounter notes, upcoming appointments, etc.  Non-urgent messages can be sent to your provider as well.   To learn more about what you can do with MyChart, go to [email or web address].    Your next appointment:   3 month(s)  Provider:   Buford Dresser, MD    Other Instructions Managing the Challenge of Quitting Smoking Quitting smoking is a physical and mental challenge. You may have cravings, withdrawal symptoms, and temptation to smoke. Before quitting, work with your health care provider to make a plan that can help you manage quitting. Making a plan before you quit may keep you from smoking when you have the urge to smoke while trying to quit. How to manage lifestyle changes Managing stress Stress can make you want to smoke, and wanting to smoke may cause stress. It is important to find ways to manage your stress. You could try some of the following: Practice relaxation techniques. Breathe slowly and deeply, in through your nose and out through your mouth. Listen to music. Soak in a bath or take a shower. Imagine a peaceful place or vacation. Get some support. Talk with family or  friends about your stress. Join a support group. Talk with a counselor or therapist. Get some physical activity. Go for a walk, run, or bike ride. Play a favorite sport. Practice yoga.  Medicines Talk with your health care provider about medicines that might help you deal with cravings and make quitting easier for you. Relationships Social situations can be difficult when you are quitting smoking. To manage this, you can: Avoid parties and other social situations where people might be smoking. Avoid alcohol. Leave right away if you have the urge to smoke. Explain to your family and friends that you are quitting smoking. Ask for  support and let them know you might be a bit grumpy. Plan activities where smoking is not an option. General instructions Be aware that many people gain weight after they quit smoking. However, not everyone does. To keep from gaining weight, have a plan in place before you quit, and stick to the plan after you quit. Your plan should include: Eating healthy snacks. When you have a craving, it may help to: Eat popcorn, or try carrots, celery, or other cut vegetables. Chew sugar-free gum. Changing how you eat. Eat small portion sizes at meals. Eat 4-6 small meals throughout the day instead of 1-2 large meals a day. Be mindful when you eat. You should avoid watching television or doing other things that might distract you as you eat. Exercising regularly. Make time to exercise each day. If you do not have time for a long workout, do short bouts of exercise for 5-10 minutes several times a day. Do some form of strengthening exercise, such as weight lifting. Do some exercise that gets your heart beating and causes you to breathe deeply, such as walking fast, running, swimming, or biking. This is very important. Drinking plenty of water or other low-calorie or no-calorie drinks. Drink enough fluid to keep your urine pale yellow.  How to recognize withdrawal  symptoms Your body and mind may experience discomfort as you try to get used to not having nicotine in your system. These effects are called withdrawal symptoms. They may include: Feeling hungrier than normal. Having trouble concentrating. Feeling irritable or restless. Having trouble sleeping. Feeling depressed. Craving a cigarette. These symptoms may surprise you, but they are normal to have when quitting smoking. To manage withdrawal symptoms: Avoid places, people, and activities that trigger your cravings. Remember why you want to quit. Get plenty of sleep. Avoid coffee and other drinks that contain caffeine. These may worsen some of your symptoms. How to manage cravings Come up with a plan for how to deal with your cravings. The plan should include the following: A definition of the specific situation you want to deal with. An activity or action you will take to replace smoking. A clear idea for how this action will help. The name of someone who could help you with this. Cravings usually last for 5-10 minutes. Consider taking the following actions to help you with your plan to deal with cravings: Keep your mouth busy. Chew sugar-free gum. Suck on hard candies or a straw. Brush your teeth. Keep your hands and body busy. Change to a different activity right away. Squeeze or play with a ball. Do an activity or a hobby, such as making bead jewelry, practicing needlepoint, or working with wood. Mix up your normal routine. Take a short exercise break. Go for a quick walk, or run up and down stairs. Focus on doing something kind or helpful for someone else. Call a friend or family member to talk during a craving. Join a support group. Contact a quitline. Where to find support To get help or find a support group: Call the [facility]'s Smoking Quitline: 1-800-QUIT-NOW (301)511-0926) Text QUIT to SmokefreeTXT: 469629 Where to find more information Visit these websites to  find more information on quitting smoking: U.S. Department of Health and Human Services: www.smokefree.gov American Lung Association: www.freedomfromsmoking.org Centers for Disease Control and Prevention (CDC): http://www.wolf.info/ American Heart Association: www.heart.org Contact a health care provider if: You want to change your plan for quitting. The medicines you are taking are not helping. Your eating feels out  of control or you cannot sleep. You feel depressed or become very anxious. Summary Quitting smoking is a physical and mental challenge. You will face cravings, withdrawal symptoms, and temptation to smoke again. Preparation can help you as you go through these challenges. Try different techniques to manage stress, handle social situations, and prevent weight gain. You can deal with cravings by keeping your mouth busy (such as by chewing gum), keeping your hands and body busy, calling family or friends, or contacting a quitline for people who want to quit smoking. You can deal with withdrawal symptoms by avoiding places where people smoke, getting plenty of rest, and avoiding drinks that contain caffeine. This information is not intended to replace advice given to you by your health care provider. Make sure you discuss any questions you have with your health care provider. Document Revised: 10/13/2021 Document Reviewed: 10/13/2021 Elsevier Patient Education  2023 [place], Trudi Ida, NP  12/10/2022 11:00 AM    [place]

## 2022-12-10 ENCOUNTER — Encounter (HOSPITAL_BASED_OUTPATIENT_CLINIC_OR_DEPARTMENT_OTHER): Payer: Self-pay | Admitting: Cardiology

## 2022-12-10 ENCOUNTER — Ambulatory Visit (HOSPITAL_BASED_OUTPATIENT_CLINIC_OR_DEPARTMENT_OTHER): Payer: Medicare PPO | Admitting: Cardiology

## 2022-12-10 VITALS — BP 118/66 | HR 70 | Ht 72.0 in | Wt 371.0 lb

## 2022-12-10 DIAGNOSIS — Z951 Presence of aortocoronary bypass graft: Secondary | ICD-10-CM | POA: Diagnosis not present

## 2022-12-10 DIAGNOSIS — I2510 Atherosclerotic heart disease of native coronary artery without angina pectoris: Secondary | ICD-10-CM

## 2022-12-10 DIAGNOSIS — I10 Essential (primary) hypertension: Secondary | ICD-10-CM

## 2022-12-10 DIAGNOSIS — Z720 Tobacco use: Secondary | ICD-10-CM | POA: Diagnosis not present

## 2022-12-10 DIAGNOSIS — I5032 Chronic diastolic (congestive) heart failure: Secondary | ICD-10-CM | POA: Diagnosis not present

## 2022-12-10 DIAGNOSIS — E782 Mixed hyperlipidemia: Secondary | ICD-10-CM

## 2022-12-10 LAB — CBC WITH DIFFERENTIAL
Basophils Absolute: 0.1 x10E3/uL (ref 0.0–0.2)
Basos: 1 %
EOS (ABSOLUTE): 0.3 x10E3/uL (ref 0.0–0.4)
Eos: 5 %
Hematocrit: 42 % (ref 37.5–51.0)
Hemoglobin: 13.9 g/dL (ref 13.0–17.7)
Immature Grans (Abs): 0 x10E3/uL (ref 0.0–0.1)
Immature Granulocytes: 0 %
Lymphocytes Absolute: 2.1 x10E3/uL (ref 0.7–3.1)
Lymphs: 35 %
MCH: 31.2 pg (ref 26.6–33.0)
MCHC: 33.1 g/dL (ref 31.5–35.7)
MCV: 94 fL (ref 79–97)
Monocytes Absolute: 0.4 x10E3/uL (ref 0.1–0.9)
Monocytes: 7 %
Neutrophils Absolute: 3.2 x10E3/uL (ref 1.4–7.0)
Neutrophils: 52 %
RBC: 4.45 x10E6/uL (ref 4.14–5.80)
RDW: 13.6 % (ref 11.6–15.4)
WBC: 6.1 x10E3/uL (ref 3.4–10.8)

## 2022-12-10 LAB — BASIC METABOLIC PANEL WITH GFR
BUN/Creatinine Ratio: 9 (ref 9–20)
BUN: 9 mg/dL (ref 6–24)
CO2: 24 mmol/L (ref 20–29)
Calcium: 9.4 mg/dL (ref 8.7–10.2)
Chloride: 103 mmol/L (ref 96–106)
Creatinine, Ser: 1.05 mg/dL (ref 0.76–1.27)
Glucose: 102 mg/dL — ABNORMAL HIGH (ref 70–99)
Potassium: 4.2 mmol/L (ref 3.5–5.2)
Sodium: 142 mmol/L (ref 134–144)
eGFR: 82 mL/min/1.73

## 2022-12-10 MED ORDER — METOPROLOL TARTRATE 25 MG PO TABS: 12.5000 mg | ORAL_TABLET | Freq: Two times a day (BID) | ORAL | 3 refills | Status: DC

## 2022-12-10 MED ORDER — FUROSEMIDE 40 MG PO TABS: 40.0000 mg | ORAL_TABLET | Freq: Every day | ORAL | 3 refills | Status: DC

## 2022-12-10 MED ORDER — EZETIMIBE 10 MG PO TABS: 10.0000 mg | ORAL_TABLET | Freq: Every day | ORAL | 3 refills | Status: DC

## 2022-12-10 NOTE — Patient Instructions (Addendum)
Medication Instructions:  Your physician has recommended you make the following change in your medication:   Start: Zetia '10mg'$  daily   We have refilled your Lasix and Metoprolol today!   *If you need a refill on your cardiac medications before your next appointment, please call your pharmacy*   Lab Work: Your physician recommends that you return for lab work today- BMP, CBC   Please return for Lab work in 6 weeks for fasting Lipid Panel. You may come to the...   [facility] (3rd floor) [address] [place], [place] [address]  Open: 8am-Noon and 1pm-4:30pm  Please ring the doorbell on the small table when you exit the elevator and the Lab Tech will come get you  [place] [facility] [address] [place], [place], [place] [address] Open: 8am-1pm, then 2pm-4:30pm   [place]- Please see attached locations sheet stapled to your lab work with address and hours.   If you have labs (blood work) drawn today and your tests are completely normal, you will receive your results only by: [place] (if you have MyChart) OR A paper copy in the mail If you have any lab test that is abnormal or we need to change your treatment, we will call you to review the results.  Follow-Up: At [facility], you and your health needs are our priority.  As part of our continuing mission to provide you with exceptional heart care, we have created designated Provider Care Teams.  These Care Teams include your primary Cardiologist (physician) and Advanced Practice Providers (APPs -  Physician Assistants and Nurse Practitioners) who all work together to provide you with the care you need, when you need it.  We recommend signing up for the patient portal called "MyChart".  Sign up information is provided on this After Visit Summary.  MyChart is used to connect with patients for Virtual Visits (Telemedicine).  Patients are able to view lab/test results,  encounter notes, upcoming appointments, etc.  Non-urgent messages can be sent to your provider as well.   To learn more about what you can do with MyChart, go to [email or web address].    Your next appointment:   3 month(s)  Provider:   Buford Dresser, MD    Other Instructions Managing the Challenge of Quitting Smoking Quitting smoking is a physical and mental challenge. You may have cravings, withdrawal symptoms, and temptation to smoke. Before quitting, work with your health care provider to make a plan that can help you manage quitting. Making a plan before you quit may keep you from smoking when you have the urge to smoke while trying to quit. How to manage lifestyle changes Managing stress Stress can make you want to smoke, and wanting to smoke may cause stress. It is important to find ways to manage your stress. You could try some of the following: Practice relaxation techniques. Breathe slowly and deeply, in through your nose and out through your mouth. Listen to music. Soak in a bath or take a shower. Imagine a peaceful place or vacation. Get some support. Talk with family or friends about your stress. Join a support group. Talk with a counselor or therapist. Get some physical activity. Go for a walk, run, or bike ride. Play a favorite sport. Practice yoga.  Medicines Talk with your health care provider about medicines that might help you deal with cravings and make quitting easier for you. Relationships Social situations can be difficult when you are quitting smoking. To  manage this, you can: Avoid parties and other social situations where people might be smoking. Avoid alcohol. Leave right away if you have the urge to smoke. Explain to your family and friends that you are quitting smoking. Ask for support and let them know you might be a bit grumpy. Plan activities where smoking is not an option. General instructions Be aware that many people gain weight  after they quit smoking. However, not everyone does. To keep from gaining weight, have a plan in place before you quit, and stick to the plan after you quit. Your plan should include: Eating healthy snacks. When you have a craving, it may help to: Eat popcorn, or try carrots, celery, or other cut vegetables. Chew sugar-free gum. Changing how you eat. Eat small portion sizes at meals. Eat 4-6 small meals throughout the day instead of 1-2 large meals a day. Be mindful when you eat. You should avoid watching television or doing other things that might distract you as you eat. Exercising regularly. Make time to exercise each day. If you do not have time for a long workout, do short bouts of exercise for 5-10 minutes several times a day. Do some form of strengthening exercise, such as weight lifting. Do some exercise that gets your heart beating and causes you to breathe deeply, such as walking fast, running, swimming, or biking. This is very important. Drinking plenty of water or other low-calorie or no-calorie drinks. Drink enough fluid to keep your urine pale yellow.  How to recognize withdrawal symptoms Your body and mind may experience discomfort as you try to get used to not having nicotine in your system. These effects are called withdrawal symptoms. They may include: Feeling hungrier than normal. Having trouble concentrating. Feeling irritable or restless. Having trouble sleeping. Feeling depressed. Craving a cigarette. These symptoms may surprise you, but they are normal to have when quitting smoking. To manage withdrawal symptoms: Avoid places, people, and activities that trigger your cravings. Remember why you want to quit. Get plenty of sleep. Avoid coffee and other drinks that contain caffeine. These may worsen some of your symptoms. How to manage cravings Come up with a plan for how to deal with your cravings. The plan should include the following: A definition of the specific  situation you want to deal with. An activity or action you will take to replace smoking. A clear idea for how this action will help. The name of someone who could help you with this. Cravings usually last for 5-10 minutes. Consider taking the following actions to help you with your plan to deal with cravings: Keep your mouth busy. Chew sugar-free gum. Suck on hard candies or a straw. Brush your teeth. Keep your hands and body busy. Change to a different activity right away. Squeeze or play with a ball. Do an activity or a hobby, such as making bead jewelry, practicing needlepoint, or working with wood. Mix up your normal routine. Take a short exercise break. Go for a quick walk, or run up and down stairs. Focus on doing something kind or helpful for someone else. Call a friend or family member to talk during a craving. Join a support group. Contact a quitline. Where to find support To get help or find a support group: Call the [facility]'s Smoking Quitline: 1-800-QUIT-NOW 604-843-7699) Text QUIT to SmokefreeTXT: 885027 Where to find more information Visit these websites to find more information on quitting smoking: U.S. Department of Health and Human Services: www.smokefree.gov American Lung  Association: www.freedomfromsmoking.org Centers for Disease Control and Prevention (CDC): http://www.wolf.info/ American Heart Association: www.heart.org Contact a health care provider if: You want to change your plan for quitting. The medicines you are taking are not helping. Your eating feels out of control or you cannot sleep. You feel depressed or become very anxious. Summary Quitting smoking is a physical and mental challenge. You will face cravings, withdrawal symptoms, and temptation to smoke again. Preparation can help you as you go through these challenges. Try different techniques to manage stress, handle social situations, and prevent weight gain. You can deal with cravings by  keeping your mouth busy (such as by chewing gum), keeping your hands and body busy, calling family or friends, or contacting a quitline for people who want to quit smoking. You can deal with withdrawal symptoms by avoiding places where people smoke, getting plenty of rest, and avoiding drinks that contain caffeine. This information is not intended to replace advice given to you by your health care provider. Make sure you discuss any questions you have with your health care provider. Document Revised: 10/13/2021 Document Reviewed: 10/13/2021 Elsevier Patient Education  [place].

## 2022-12-13 ENCOUNTER — Telehealth: Payer: Self-pay

## 2022-12-13 NOTE — Telephone Encounter (Signed)
Mike,PTA from [facility] called stating that he has been unable to reach patient and he will have 2 missed PT visits.

## 2022-12-26 DIAGNOSIS — Z951 Presence of aortocoronary bypass graft: Secondary | ICD-10-CM | POA: Diagnosis not present

## 2022-12-26 DIAGNOSIS — Z48812 Encounter for surgical aftercare following surgery on the circulatory system: Secondary | ICD-10-CM | POA: Diagnosis not present

## 2022-12-26 DIAGNOSIS — G4733 Obstructive sleep apnea (adult) (pediatric): Secondary | ICD-10-CM | POA: Diagnosis not present

## 2022-12-26 DIAGNOSIS — K5900 Constipation, unspecified: Secondary | ICD-10-CM | POA: Diagnosis not present

## 2022-12-26 DIAGNOSIS — G40909 Epilepsy, unspecified, not intractable, without status epilepticus: Secondary | ICD-10-CM | POA: Diagnosis not present

## 2022-12-26 DIAGNOSIS — E785 Hyperlipidemia, unspecified: Secondary | ICD-10-CM | POA: Diagnosis not present

## 2022-12-26 DIAGNOSIS — I5032 Chronic diastolic (congestive) heart failure: Secondary | ICD-10-CM | POA: Diagnosis not present

## 2022-12-26 DIAGNOSIS — K219 Gastro-esophageal reflux disease without esophagitis: Secondary | ICD-10-CM | POA: Diagnosis not present

## 2022-12-26 DIAGNOSIS — I110 Hypertensive heart disease with heart failure: Secondary | ICD-10-CM | POA: Diagnosis not present

## 2022-12-28 ENCOUNTER — Telehealth: Payer: Self-pay | Admitting: Cardiology

## 2022-12-28 ENCOUNTER — Telehealth: Payer: Self-pay | Admitting: Interventional Cardiology

## 2022-12-28 MED ORDER — CLOPIDOGREL BISULFATE 75 MG PO TABS: 75.0000 mg | ORAL_TABLET | Freq: Every day | ORAL | 1 refills | Status: DC

## 2022-12-28 NOTE — Telephone Encounter (Signed)
[facility] is returning call to talk with nurse again in regards to this patient and his urine output.

## 2022-12-28 NOTE — Telephone Encounter (Signed)
Spoke to Shirlean Mylar [facility]) she was a little concerned that the additional dose of lasix will not be enough medication to decrease pt. Swelling. However, when I spoke to the patient he said his swelling has decrease some and he denies any other symptoms. Robin expressed concerns pertaining to pt urinary output, he only voided twice today. Pt stated he decreased his fluid intake and feels fine. Advised patient if develop any new or worsening symptoms immediately   call 911 or go to the nearest ER. Patient voiced understanding.

## 2022-12-28 NOTE — Telephone Encounter (Signed)
*  STAT* If patient is at the pharmacy, call can be transferred to refill team.   1. Which medications need to be refilled? (please list name of each medication and dose if known) clopidogrel (PLAVIX) 75 MG tablet   2. Which pharmacy/location (including street and city if local pharmacy) is medication to be sent to?  [id number] [place], [place] - [id number]    3. Do they need a 30 day or 90 day supply? [address]

## 2022-12-28 NOTE — Telephone Encounter (Signed)
Plavix 75 mg daily was sent to pts pharmacy as requested.

## 2022-12-28 NOTE — Telephone Encounter (Signed)
Pt c/o swelling: STAT is pt has developed SOB within 24 hours  How much weight have you gained and in what time span?  Patient's s/o states the patient gained 5 lbs since Tuesday, 2/20  If swelling, where is the swelling located?  Feet and right leg  Are you currently taking a fluid pill?  Unsure   Are you currently SOB?  No   Do you have a log of your daily weights (if so, list)?  2/20: 374.44 lbs 2/21: 377.2 lbs 2/23: 379.4 lbs  Have you gained 3 pounds in a day or 5 pounds in a week?    Have you traveled recently?  No

## 2022-12-28 NOTE — Telephone Encounter (Signed)
Returned call to patient's fiance, Robin (OK per DPR).  Shirlean Mylar reports patient has had a weight gain of 5 lbs over the past 4 days. Denies any SOB. Good urine output, though not getting up to urinate at night like he usually does. Shirlean Mylar reports swelling in feet and right leg.  Shirlean Mylar states patient has had [employer], [employer] and [employer] over the past week. Advised Shirlean Mylar and patient to go back to making their heart healthy meals at home to avoid excess salt in diet as this can lead to fluid retention. Per protocol: patient may take extra dose of Lasix '40mg'$  today and resume regular dose tomorrow.   Advised Robin and patient that if symptoms get worse over the weekend to call our on-call provider for advisement. Otherwise, if no improvement over the weekend callback on Monday 12/31/22.  Robin and patient verbalized understanding.  Will forward to Venia Carbon, NP and Dr. Harrell Gave to review and advise.

## 2022-12-28 NOTE — Progress Notes (Deleted)
[address]       [place] [address]             [phone]    HPI: This is a 57 year old male with a past medical history of MI and coronary artery disease (s/p PTCI of the LAD in 2016), ischemic cardiomyopathy with EF of 30 to 35% (and subsequent recovery EF of 45 to 50%), dyslipidemia, hypertension, status post gamma knife therapy for a benign epidermoid right cyst in 2019 and  seizure disorder and is managed with 3 antiseizure medications who was found to have a NSTEMI and single vessel coronary artery disease. He underwent a CABG x 1 (LIMA to LAD off pump) by Dr. [name] on 11/13/2022. He was discharged to CIR on 01/19 and then discharged to home on 01/26. He denies chest pain or shortness of breath.  Current Outpatient Medications  Medication Sig Dispense Refill   acetaminophen (TYLENOL) 325 MG tablet Take 1-2 tablets (325-650 mg total) by mouth every 4 (four) hours as needed for mild pain.     aspirin EC 81 MG tablet Take 1 tablet (81 mg total) by mouth daily. 30 tablet 12   citalopram (CELEXA) 40 MG tablet Take 40 mg by mouth every morning.     clonazePAM (KLONOPIN) 0.5 MG tablet Take 0.25-0.5 mg by mouth 2 (two) times daily.     clopidogrel (PLAVIX) 75 MG tablet Take 1 tablet (75 mg total) by mouth daily. 30 tablet 0   docusate sodium (COLACE) 100 MG capsule Take 2 capsules (200 mg total) by mouth daily. 60 capsule 0   ezetimibe (ZETIA) 10 MG tablet Take 1 tablet (10 mg total) by mouth daily. 90 tablet 3   famotidine (PEPCID) 20 MG tablet Take 1 tablet (20 mg total) by mouth at bedtime.     Fremanezumab-vfrm (AJOVY) 225 MG/1.5ML SOAJ Inject 1 Dose into the skin every 30 (thirty) days.     furosemide (LASIX) 40 MG tablet Take 1 tablet (40 mg total) by mouth daily. 90 tablet 3   lamoTRIgine (LAMICTAL) 100 MG tablet Take 200 mg by mouth in the morning and at bedtime.     levETIRAcetam (KEPPRA XR) 500 MG 24 hr tablet Take 1,000 mg by mouth 2 (two) times  daily.     Melatonin 5 MG CHEW Chew 15-20 mg by mouth at bedtime.     metoprolol tartrate (LOPRESSOR) 25 MG tablet Take 0.5 tablets (12.5 mg total) by mouth 2 (two) times daily. 90 tablet 3   oxyCODONE-acetaminophen (PERCOCET/ROXICET) 5-325 MG tablet Take 1 tablet by mouth every 8 (eight) hours as needed for moderate pain (migraine headache). 21 tablet 0   pantoprazole (PROTONIX) 40 MG tablet Take 2 tablets (80 mg total) by mouth daily. 30 tablet 0   perampanel (FYCOMPA) 8 MG tablet Take 1 tablet by mouth daily.     polyethylene glycol powder (GLYCOLAX/MIRALAX) 17 GM/SCOOP powder Dissolve 1 capful (17 g) in water and take by mouth 2 (two) times daily 238 g 0   rosuvastatin (CRESTOR) 40 MG tablet Take 1 tablet (40 mg total) by mouth daily. 30 tablet 0  Vital Signs:   Physical Exam: CV- Pulmonary- Abdomen- Extremities- Wound-  Diagnostic Tests: ***  Impression and Plan: Overall, Mr. Giacona is recovering well from coronary artery bypass grafting surgery. You are encouraged to enroll and participate in the outpatient cardiac rehab program beginning as soon as practical. You may return to driving an automobile as long as  you are no longer requiring oral narcotic pain relievers during the daytime.  It would be wise to start driving only short distances during the daylight and gradually increase from there as you feel comfortable. Continue to avoid any heavy lifting or strenuous use of your arms or shoulders for at least a total of three months from the time of surgery.  After three months you may gradually increase how much you lift or otherwise use your arms or chest as tolerated, with limits based upon whether or not activities lead to the return of significant discomfort.     Nani Skillern, PA-C [facility] [phone]

## 2022-12-30 DIAGNOSIS — Z951 Presence of aortocoronary bypass graft: Secondary | ICD-10-CM | POA: Diagnosis not present

## 2022-12-30 DIAGNOSIS — I5032 Chronic diastolic (congestive) heart failure: Secondary | ICD-10-CM | POA: Diagnosis not present

## 2022-12-30 DIAGNOSIS — R561 Post traumatic seizures: Secondary | ICD-10-CM | POA: Diagnosis not present

## 2022-12-31 ENCOUNTER — Other Ambulatory Visit: Payer: Self-pay | Admitting: Thoracic Surgery (Cardiothoracic Vascular Surgery)

## 2022-12-31 DIAGNOSIS — Z951 Presence of aortocoronary bypass graft: Secondary | ICD-10-CM

## 2022-12-31 NOTE — Telephone Encounter (Signed)
Continue to have his restrict his fluid to < 2 liters/day. He needs to try to adhere to a low salt diet < 2 grams/day. Continue to weigh daily, can take an extra dose of lasix if needed for wt gain of 3 lbs/night or 5 lbs/week.  Thank you,

## 2022-12-31 NOTE — Telephone Encounter (Signed)
This patient was just recently seen by Candis Schatz will forward to her. In general recommend daily weights, watching for worsening swelling/shortness of breath.

## 2022-12-31 NOTE — Telephone Encounter (Signed)
Addressed by phone encounter.   Trudi Ida, NP

## 2023-01-01 ENCOUNTER — Ambulatory Visit: Payer: Medicare PPO

## 2023-01-01 NOTE — Telephone Encounter (Signed)
Left message to call the clinic.

## 2023-01-02 DIAGNOSIS — K5900 Constipation, unspecified: Secondary | ICD-10-CM | POA: Diagnosis not present

## 2023-01-02 DIAGNOSIS — G40909 Epilepsy, unspecified, not intractable, without status epilepticus: Secondary | ICD-10-CM | POA: Diagnosis not present

## 2023-01-02 DIAGNOSIS — K219 Gastro-esophageal reflux disease without esophagitis: Secondary | ICD-10-CM | POA: Diagnosis not present

## 2023-01-02 DIAGNOSIS — Z48812 Encounter for surgical aftercare following surgery on the circulatory system: Secondary | ICD-10-CM | POA: Diagnosis not present

## 2023-01-02 DIAGNOSIS — I5032 Chronic diastolic (congestive) heart failure: Secondary | ICD-10-CM | POA: Diagnosis not present

## 2023-01-02 DIAGNOSIS — E785 Hyperlipidemia, unspecified: Secondary | ICD-10-CM | POA: Diagnosis not present

## 2023-01-02 DIAGNOSIS — Z951 Presence of aortocoronary bypass graft: Secondary | ICD-10-CM | POA: Diagnosis not present

## 2023-01-02 DIAGNOSIS — G4733 Obstructive sleep apnea (adult) (pediatric): Secondary | ICD-10-CM | POA: Diagnosis not present

## 2023-01-02 DIAGNOSIS — I110 Hypertensive heart disease with heart failure: Secondary | ICD-10-CM | POA: Diagnosis not present

## 2023-01-07 ENCOUNTER — Other Ambulatory Visit: Payer: Self-pay | Admitting: Nurse Practitioner

## 2023-01-11 DIAGNOSIS — R7303 Prediabetes: Secondary | ICD-10-CM | POA: Diagnosis not present

## 2023-01-11 DIAGNOSIS — G43909 Migraine, unspecified, not intractable, without status migrainosus: Secondary | ICD-10-CM | POA: Diagnosis not present

## 2023-01-11 DIAGNOSIS — I2510 Atherosclerotic heart disease of native coronary artery without angina pectoris: Secondary | ICD-10-CM | POA: Diagnosis not present

## 2023-01-11 DIAGNOSIS — G40909 Epilepsy, unspecified, not intractable, without status epilepticus: Secondary | ICD-10-CM | POA: Diagnosis not present

## 2023-01-11 DIAGNOSIS — Z6841 Body Mass Index (BMI) 40.0 and over, adult: Secondary | ICD-10-CM | POA: Diagnosis not present

## 2023-01-15 NOTE — Patient Instructions (Signed)
You are encouraged to enroll and participate in the outpatient cardiac rehab program beginning as soon as practical.   Patient is counseled regarding the importance of long term risk factor modification as they pertain to the presence of ischemic heart disease including avoiding the use of all tobacco products, dietary modifications and medical therapy for diabetes, cholesterol and lipid management, and regular exercise.     Make every effort to maintain a "heart-healthy" lifestyle with regular physical exercise and adherence to a low-fat, low-carbohydrate diet.  Continue to seek regular follow-up appointments with your primary care physician and/or cardiologist.   You may return to driving an automobile as long as you are no longer requiring oral narcotic pain relievers during the daytime.  It would be wise to start driving only short distances during the daylight and gradually increase from there as you feel comfortable.   You may continue to gradually increase your physical activity as tolerated.  Refrain from any heavy lifting or strenuous use of your arms and shoulders until at least 8 weeks from the time of your surgery, and avoid activities that cause increased pain in your chest on the side of your surgical incision.  Otherwise you may continue to increase activities without any particular limitations.  Increase the intensity and duration of physical activity gradually.

## 2023-01-15 NOTE — Progress Notes (Unsigned)
      [address]       [place] [address]             [phone]    HPI: Patient returns for routine postoperative follow-up having undergone CABG x 1 on 11/13/2022. The patient's early postoperative recovery while in the hospital was notable for respiratory issues.  He was empirically treated for possible pneumonia.  He was deconditioned and inpatient rehabilitation was recommended.  He was discharged to rehab on 11/23/2022.  Since hospital discharge the patient reports ***.   Current Outpatient Medications  Medication Sig Dispense Refill   acetaminophen (TYLENOL) 325 MG tablet Take 1-2 tablets (325-650 mg total) by mouth every 4 (four) hours as needed for mild pain.     aspirin EC 81 MG tablet Take 1 tablet (81 mg total) by mouth daily. 30 tablet 12   citalopram (CELEXA) 40 MG tablet Take 40 mg by mouth every morning.     clonazePAM (KLONOPIN) 0.5 MG tablet Take 0.25-0.5 mg by mouth 2 (two) times daily.     clopidogrel (PLAVIX) 75 MG tablet Take 1 tablet (75 mg total) by mouth daily. 30 tablet 1   docusate sodium (COLACE) 100 MG capsule Take 2 capsules (200 mg total) by mouth daily. 60 capsule 0   ezetimibe (ZETIA) 10 MG tablet Take 1 tablet (10 mg total) by mouth daily. 90 tablet 3   famotidine (PEPCID) 20 MG tablet Take 1 tablet (20 mg total) by mouth at bedtime.     Fremanezumab-vfrm (AJOVY) 225 MG/1.5ML SOAJ Inject 1 Dose into the skin every 30 (thirty) days.     furosemide (LASIX) 40 MG tablet Take 1 tablet (40 mg total) by mouth daily. 90 tablet 3   lamoTRIgine (LAMICTAL) 100 MG tablet Take 200 mg by mouth in the morning and at bedtime.     levETIRAcetam (KEPPRA XR) 500 MG 24 hr tablet Take 1,000 mg by mouth 2 (two) times daily.     Melatonin 5 MG CHEW Chew 15-20 mg by mouth at bedtime.     metoprolol tartrate (LOPRESSOR) 25 MG tablet Take 0.5 tablets (12.5 mg total) by mouth 2 (two) times daily. 90 tablet 3   oxyCODONE-acetaminophen (PERCOCET/ROXICET) 5-325 MG  tablet Take 1 tablet by mouth every 8 (eight) hours as needed for moderate pain (migraine headache). 21 tablet 0   pantoprazole (PROTONIX) 40 MG tablet Take 2 tablets (80 mg total) by mouth daily. 30 tablet 0   perampanel (FYCOMPA) 8 MG tablet Take 1 tablet by mouth daily.     polyethylene glycol powder (GLYCOLAX/MIRALAX) 17 GM/SCOOP powder Dissolve 1 capful (17 g) in water and take by mouth 2 (two) times daily 238 g 0   rosuvastatin (CRESTOR) 40 MG tablet Take 1 tablet (40 mg total) by mouth daily. 30 tablet 0   No current facility-administered medications for this visit.    Physical Exam: ***  Diagnostic Tests: ***   A/P:  S/P CABG x 1 Cardiac Rehabilitation- you are encouraged to enroll if you wish to participate Activity-   Ellwood Handler, PA-C [facility] [phone]

## 2023-01-16 ENCOUNTER — Ambulatory Visit
Admission: RE | Admit: 2023-01-16 | Discharge: 2023-01-16 | Disposition: A | Discharge status: Home / Self Care | Payer: Medicare PPO | Source: Ambulatory Visit | Attending: Thoracic Surgery (Cardiothoracic Vascular Surgery) | Admitting: Thoracic Surgery (Cardiothoracic Vascular Surgery)

## 2023-01-16 ENCOUNTER — Ambulatory Visit (INDEPENDENT_AMBULATORY_CARE_PROVIDER_SITE_OTHER): Payer: Self-pay | Admitting: Physician Assistant

## 2023-01-16 VITALS — BP 111/68 | HR 80 | Resp 20 | Ht 72.0 in | Wt 370.0 lb

## 2023-01-16 DIAGNOSIS — I25810 Atherosclerosis of coronary artery bypass graft(s) without angina pectoris: Secondary | ICD-10-CM | POA: Diagnosis not present

## 2023-01-16 DIAGNOSIS — Z951 Presence of aortocoronary bypass graft: Secondary | ICD-10-CM

## 2023-01-16 DIAGNOSIS — Z955 Presence of coronary angioplasty implant and graft: Secondary | ICD-10-CM | POA: Diagnosis not present

## 2023-01-19 DIAGNOSIS — G4733 Obstructive sleep apnea (adult) (pediatric): Secondary | ICD-10-CM | POA: Diagnosis not present

## 2023-01-21 DIAGNOSIS — I5032 Chronic diastolic (congestive) heart failure: Secondary | ICD-10-CM | POA: Diagnosis not present

## 2023-01-21 DIAGNOSIS — I2510 Atherosclerotic heart disease of native coronary artery without angina pectoris: Secondary | ICD-10-CM | POA: Diagnosis not present

## 2023-01-21 DIAGNOSIS — I10 Essential (primary) hypertension: Secondary | ICD-10-CM | POA: Diagnosis not present

## 2023-01-21 DIAGNOSIS — Z951 Presence of aortocoronary bypass graft: Secondary | ICD-10-CM | POA: Diagnosis not present

## 2023-01-21 DIAGNOSIS — E782 Mixed hyperlipidemia: Secondary | ICD-10-CM | POA: Diagnosis not present

## 2023-01-22 LAB — LIPID PANEL
Chol/HDL Ratio: 2.4 ratio (ref 0.0–5.0)
Cholesterol, Total: 107 mg/dL (ref 100–199)
HDL: 44 mg/dL
LDL Chol Calc (NIH): 40 mg/dL (ref 0–99)
Triglycerides: 130 mg/dL (ref 0–149)
VLDL Cholesterol Cal: 23 mg/dL (ref 5–40)

## 2023-01-28 DIAGNOSIS — Z951 Presence of aortocoronary bypass graft: Secondary | ICD-10-CM | POA: Diagnosis not present

## 2023-01-28 DIAGNOSIS — I5032 Chronic diastolic (congestive) heart failure: Secondary | ICD-10-CM | POA: Diagnosis not present

## 2023-01-28 DIAGNOSIS — R561 Post traumatic seizures: Secondary | ICD-10-CM | POA: Diagnosis not present

## 2023-02-03 ENCOUNTER — Other Ambulatory Visit: Payer: Self-pay | Admitting: Nurse Practitioner

## 2023-02-09 ENCOUNTER — Other Ambulatory Visit: Payer: Self-pay | Admitting: Nurse Practitioner

## 2023-02-25 ENCOUNTER — Other Ambulatory Visit: Payer: Self-pay | Admitting: Cardiology

## 2023-02-25 NOTE — Telephone Encounter (Signed)
Rx to pharmacy

## 2023-02-28 DIAGNOSIS — Z951 Presence of aortocoronary bypass graft: Secondary | ICD-10-CM | POA: Diagnosis not present

## 2023-02-28 DIAGNOSIS — R561 Post traumatic seizures: Secondary | ICD-10-CM | POA: Diagnosis not present

## 2023-02-28 DIAGNOSIS — I5032 Chronic diastolic (congestive) heart failure: Secondary | ICD-10-CM | POA: Diagnosis not present

## 2023-03-11 ENCOUNTER — Ambulatory Visit (HOSPITAL_BASED_OUTPATIENT_CLINIC_OR_DEPARTMENT_OTHER): Payer: Medicare PPO | Admitting: Cardiology

## 2023-03-11 ENCOUNTER — Telehealth (HOSPITAL_BASED_OUTPATIENT_CLINIC_OR_DEPARTMENT_OTHER): Payer: Self-pay | Admitting: Cardiology

## 2023-03-11 ENCOUNTER — Encounter (HOSPITAL_BASED_OUTPATIENT_CLINIC_OR_DEPARTMENT_OTHER): Payer: Self-pay

## 2023-03-11 NOTE — Telephone Encounter (Signed)
Left message for patient to call and reschedule the 03/11/23 4:00 pm appointment with Dr. Ronelle Nigh not in office

## 2023-03-28 DIAGNOSIS — I2510 Atherosclerotic heart disease of native coronary artery without angina pectoris: Secondary | ICD-10-CM | POA: Diagnosis not present

## 2023-03-28 DIAGNOSIS — E559 Vitamin D deficiency, unspecified: Secondary | ICD-10-CM | POA: Diagnosis not present

## 2023-03-28 DIAGNOSIS — Z79899 Other long term (current) drug therapy: Secondary | ICD-10-CM | POA: Diagnosis not present

## 2023-03-30 DIAGNOSIS — R561 Post traumatic seizures: Secondary | ICD-10-CM | POA: Diagnosis not present

## 2023-03-30 DIAGNOSIS — I5032 Chronic diastolic (congestive) heart failure: Secondary | ICD-10-CM | POA: Diagnosis not present

## 2023-03-30 DIAGNOSIS — Z951 Presence of aortocoronary bypass graft: Secondary | ICD-10-CM | POA: Diagnosis not present

## 2023-04-04 DIAGNOSIS — Z0000 Encounter for general adult medical examination without abnormal findings: Secondary | ICD-10-CM | POA: Diagnosis not present

## 2023-04-04 DIAGNOSIS — F324 Major depressive disorder, single episode, in partial remission: Secondary | ICD-10-CM | POA: Diagnosis not present

## 2023-04-04 DIAGNOSIS — I5032 Chronic diastolic (congestive) heart failure: Secondary | ICD-10-CM | POA: Diagnosis not present

## 2023-04-04 DIAGNOSIS — R7303 Prediabetes: Secondary | ICD-10-CM | POA: Diagnosis not present

## 2023-04-04 DIAGNOSIS — G40909 Epilepsy, unspecified, not intractable, without status epilepticus: Secondary | ICD-10-CM | POA: Diagnosis not present

## 2023-04-04 DIAGNOSIS — G43909 Migraine, unspecified, not intractable, without status migrainosus: Secondary | ICD-10-CM | POA: Diagnosis not present

## 2023-04-04 DIAGNOSIS — D496 Neoplasm of unspecified behavior of brain: Secondary | ICD-10-CM | POA: Diagnosis not present

## 2023-04-04 DIAGNOSIS — F411 Generalized anxiety disorder: Secondary | ICD-10-CM | POA: Diagnosis not present

## 2023-04-08 MED ORDER — NITROGLYCERIN 0.4 MG SL SUBL: 0.4000 mg | SUBLINGUAL_TABLET | SUBLINGUAL | 7 refills | Status: AC | PRN

## 2023-04-21 ENCOUNTER — Other Ambulatory Visit: Payer: Self-pay | Admitting: Cardiology

## 2023-04-22 ENCOUNTER — Encounter (HOSPITAL_BASED_OUTPATIENT_CLINIC_OR_DEPARTMENT_OTHER): Payer: Self-pay

## 2023-04-30 ENCOUNTER — Inpatient Hospital Stay (HOSPITAL_COMMUNITY): Admission: RE | Admit: 2023-04-30 | Payer: Medicare PPO | Source: Ambulatory Visit

## 2023-04-30 DIAGNOSIS — Z951 Presence of aortocoronary bypass graft: Secondary | ICD-10-CM | POA: Diagnosis not present

## 2023-04-30 DIAGNOSIS — I5032 Chronic diastolic (congestive) heart failure: Secondary | ICD-10-CM | POA: Diagnosis not present

## 2023-04-30 DIAGNOSIS — R561 Post traumatic seizures: Secondary | ICD-10-CM | POA: Diagnosis not present

## 2023-05-06 ENCOUNTER — Telehealth (HOSPITAL_BASED_OUTPATIENT_CLINIC_OR_DEPARTMENT_OTHER): Payer: Self-pay

## 2023-05-06 ENCOUNTER — Ambulatory Visit (HOSPITAL_BASED_OUTPATIENT_CLINIC_OR_DEPARTMENT_OTHER): Payer: Medicare PPO | Admitting: Cardiology

## 2023-05-06 ENCOUNTER — Encounter (HOSPITAL_BASED_OUTPATIENT_CLINIC_OR_DEPARTMENT_OTHER): Payer: Self-pay | Admitting: Cardiology

## 2023-05-06 ENCOUNTER — Other Ambulatory Visit (HOSPITAL_BASED_OUTPATIENT_CLINIC_OR_DEPARTMENT_OTHER): Payer: Self-pay

## 2023-05-06 VITALS — BP 100/62 | HR 75 | Ht 72.0 in | Wt 364.0 lb

## 2023-05-06 DIAGNOSIS — G4733 Obstructive sleep apnea (adult) (pediatric): Secondary | ICD-10-CM | POA: Diagnosis not present

## 2023-05-06 DIAGNOSIS — I2510 Atherosclerotic heart disease of native coronary artery without angina pectoris: Secondary | ICD-10-CM | POA: Diagnosis not present

## 2023-05-06 DIAGNOSIS — Z951 Presence of aortocoronary bypass graft: Secondary | ICD-10-CM | POA: Diagnosis not present

## 2023-05-06 DIAGNOSIS — F17210 Nicotine dependence, cigarettes, uncomplicated: Secondary | ICD-10-CM

## 2023-05-06 DIAGNOSIS — Z716 Tobacco abuse counseling: Secondary | ICD-10-CM

## 2023-05-06 DIAGNOSIS — E782 Mixed hyperlipidemia: Secondary | ICD-10-CM

## 2023-05-06 DIAGNOSIS — I255 Ischemic cardiomyopathy: Secondary | ICD-10-CM | POA: Diagnosis not present

## 2023-05-06 DIAGNOSIS — I252 Old myocardial infarction: Secondary | ICD-10-CM | POA: Diagnosis not present

## 2023-05-06 MED ORDER — SEMAGLUTIDE-WEIGHT MANAGEMENT 0.5 MG/0.5ML ~~LOC~~ SOAJ
0.5000 mg | SUBCUTANEOUS | 0 refills | Status: AC
  Filled 2023-05-06 – 2023-06-15 (×2): qty 2, 28d supply, fill #0

## 2023-05-06 MED ORDER — SEMAGLUTIDE-WEIGHT MANAGEMENT 0.25 MG/0.5ML ~~LOC~~ SOAJ
0.2500 mg | SUBCUTANEOUS | 0 refills | Status: AC
  Filled 2023-05-06: qty 2, 28d supply, fill #0

## 2023-05-06 MED ORDER — SEMAGLUTIDE-WEIGHT MANAGEMENT 1 MG/0.5ML ~~LOC~~ SOAJ
1.0000 mg | SUBCUTANEOUS | 0 refills | Status: AC
  Filled 2023-05-06 – 2023-07-09 (×2): qty 2, 28d supply, fill #0

## 2023-05-06 MED ORDER — SEMAGLUTIDE-WEIGHT MANAGEMENT 1.7 MG/0.75ML ~~LOC~~ SOAJ
1.7000 mg | SUBCUTANEOUS | 0 refills | Status: AC
  Filled 2023-05-06 – 2023-08-06 (×2): qty 3, 28d supply, fill #0

## 2023-05-06 MED ORDER — SEMAGLUTIDE-WEIGHT MANAGEMENT 2.4 MG/0.75ML ~~LOC~~ SOAJ
2.4000 mg | SUBCUTANEOUS | 9 refills | Status: DC
  Filled 2023-05-06: qty 3, fill #0
  Filled 2023-09-04: qty 3, 28d supply, fill #0
  Filled 2023-10-03: qty 3, 28d supply, fill #1
  Filled 2023-10-27: qty 3, 28d supply, fill #2
  Filled 2023-11-26: qty 3, 28d supply, fill #3
  Filled 2023-12-26: qty 3, 28d supply, fill #4
  Filled 2024-01-20: qty 3, 28d supply, fill #5
  Filled 2024-02-17: qty 3, 28d supply, fill #6
  Filled 2024-03-16: qty 3, 28d supply, fill #7
  Filled 2024-04-06: qty 3, 28d supply, fill #8

## 2023-05-06 NOTE — Telephone Encounter (Signed)
Patient started on wergovy today for cardiac indications, will likely need prior auth

## 2023-05-06 NOTE — Patient Instructions (Signed)
Medication Instructions:  Your physician has recommended you make the following change in your medication:   Start: [id number]  0.25mg  weekly for 4 weeks  0.5mg  weekly x4 weeks  1.0mg  weekly x4 weeks  1.7mg  weekly x4 weeks 2.4mg  weekly   *If you need a refill on your cardiac medications before your next appointment, please call your pharmacy*  Follow-Up: At [facility], you and your health needs are our priority.  As part of our continuing mission to provide you with exceptional heart care, we have created designated Provider Care Teams.  These Care Teams include your primary Cardiologist (physician) and Advanced Practice Providers (APPs -  Physician Assistants and Nurse Practitioners) who all work together to provide you with the care you need, when you need it.  We recommend signing up for the patient portal called "MyChart".  Sign up information is provided on this After Visit Summary.  MyChart is used to connect with patients for Virtual Visits (Telemedicine).  Patients are able to view lab/test results, encounter notes, upcoming appointments, etc.  Non-urgent messages can be sent to your provider as well.   To learn more about what you can do with MyChart, go to [email or web address].    Your next appointment:   3 months with Dr. Cristal Deer or Gillian Shields, NP   Other Instructions Try hot packs, lidocaine patches, and stretches (gentle) to see if that helps with the chest tenderness/discomfort.

## 2023-05-06 NOTE — Progress Notes (Signed)
Cardiology Office Note:  .    Date:  05/06/2023  ID:  [name], DOB 04-19-1966, MRN [id number] PCP: Daisy Floro, MD  [facility] Providers Cardiologist:  Jodelle Red, MD     History of Present Illness: .    James Torres is a 57 y.o. male with a hx of CAD (NSTEMI s/p PCI and stenting of the circumflex with a 38 mm long Synergy DES 2016; CABG x 1 LIMA to LAD 11/13/2022), ICM (03/2015 EF 35-45% with recovered EF of 50-55% following PCI -> subsequent chronic diastolic CHF), carotid artery disease, hypertension, hyperlipidemia, GERD, OSA on CPAP, s/p resection of epidermoid brain tumor, here for follow-up.   Cardiac history: He had presented to the ED on 11/10/22 with chest pain, relieved after taking 2 nitroglycerin at home. At the ER, EKG showed mild ST segment depression in inferior leads and nonspecific ST T wave changes in the inferior leads.  Initial troponin 177 > 4400.  He underwent heart catheterization which demonstrated 99% stenosis of the proximal LAD, not felt to be suitable for PCI.  He underwent CABG x 1, LIMA to LAD.  He was volume overloaded requiring diuresis, CPAP, HFNC 15 LPM. Discharged home with PT and OT was initially recommended, however he was slow to progress and eventually the decision was made for inpatient rehab at [facility]. He was admitted to inpatient rehab on 11/23/2022 to 11/30/2022 and was eventually discharged home.   Most recently seen in cardiology by Wallis Bamberg, NP 12/10/2022 where he was struggling with fatigue and migraines. Also had been struggling with smoking cessation. Hadn't smoked since 11/10/22 but noted worsening cravings since returning home. Complained of some shortness of breath with prolonged walking. Lipids 11/10/22 showed LDL 71 on rosuvastatin 40 mg daily. He was started on Zetia 10 mg daily.  Today, he is accompanied by his significant other. He complains of ongoing chest pain/soreness in his left and right chest/pectoral  regions. Examination today was consistent with the presence of several muscle knots. He notes that raising his arms and/or massaging the affected regions may alleviate some of the pain. Recently he has noticed that while sitting and typing on the laptop, or in other scrunched up positions his chest pain is exacerbated. Of note, he states that he can always tell that his current chest pain is more superficial, feels more like a strained muscle. No deep chest pain since he was in the hospital.  While in the hospital he didn't crave any cigarettes. After 3 weeks at home he started smoking again, although not as much as before. Currently he is down between 1/2 pack and 3/4 pack a day. He has been wanting to quit. Separates the cigarettes from the pack to try and limit how much he is able to smoke. He also wonders that there may be an aspect of smoking out of boredom. He has started to get up and walk or go outside more frequently. Previously intolerant of Chantix.  He affirms that he is prediabetic. He is interested in pursuing weight loss, which we discussed including alternatives such as Ozempic. He notes that he is still using a walker due to his leg giving out; he needs a hip replacement. Follows with [employer]. For pain management he has been using voltaren.  On seizure medications, has neurologist at [facility] Recently added extra strength acetaminophen for chronic migraines. Previously took [place], which was discontinued while he was in the hospital. In the past he had  tried Botox, which he will be restarting soon. Additionally he takes Percocet for his migraines when everything else is ineffective.  He denies any palpitations, shortness of breath, peripheral edema, lightheadedness, syncope, orthopnea, or PND.  ROS:  Please see the history of present illness. ROS otherwise negative except as noted.  (+) Chest pain/soreness (+) Arthralgias (+) Chronic migraines  Studies Reviewed: [name]        ECG not ordered today  Physical Exam:    VS:  BP 100/62   Pulse 75   Ht 6' (1.829 m)   Wt (!) 364 lb (165.1 kg)   SpO2 95%   BMI 49.37 kg/m    Wt Readings from Last 3 Encounters:  05/06/23 (!) 364 lb (165.1 kg)  01/16/23 (!) 370 lb (167.8 kg)  12/10/22 (!) 371 lb (168.3 kg)    GEN: Well nourished, well developed in no acute distress HEENT: Normal, moist mucous membranes NECK: No JVD CARDIAC: regular rhythm, normal S1 and S2, no rubs or gallops. No murmur. VASCULAR: Radial and DP pulses 2+ bilaterally. No carotid bruits RESPIRATORY:  Clear to auscultation without rales, wheezing or rhonchi  ABDOMEN: Soft, non-tender, non-distended MUSCULOSKELETAL:  Ambulates independently. Muscle knots of chest with tenderness to palpation. SKIN: Warm and dry, no edema NEUROLOGIC:  Alert and oriented x 3. No focal neuro deficits noted. PSYCHIATRIC:  Normal affect   ASSESSMENT AND PLAN: .    CAD with prior PCI, s/p CABG x1 11/2022 History of NSTEMI Mixed hyperlipidemia Prior ischemic cardiomyopathy with history of recovered EF -echo with anterior wall motion worse than prior, EF 45-50% prior to CABG -continue metoprolol -continue aspirin, clopidogrel (presented with NSTEMI) -continue rosuvastatin, last LDL 52 -would benefit from [facility] for secondary prevention of ASCVD, discussed at length today. He is amenable, comfortable with self injection   Ischemic cardiomyopathy:  -EF 45-50% prior to CABG -on metoprolol -no BP room for additional medications at this time  Tobacco abuse counseling: The patient was counseled on tobacco cessation today for 4 minutes.  Counseling included reviewing the risks of smoking tobacco products, how it impacts the patient's current medical diagnoses and different strategies for quitting.  Pharmacotherapy to aid in tobacco cessation was not prescribed today.   Obesity:  -BMI 49, would benefit from weight loss. If he tolerates Wegovy for secondary  prevention, weight loss may be added bonus  Dispo: Follow-up in 3 months, or sooner as needed.  Total time of encounter: 43 minutes total time of encounter, including 32 minutes spent in face-to-face patient care. This time includes coordination of care and counseling regarding above conditions. Remainder of non-face-to-face time involved reviewing chart documents/testing relevant to the patient encounter and documentation in the medical record.  Jodelle Red, MD, PhD, [facility]  [facility]    I,Mathew Stumpf,acting as a scribe for Jodelle Red, MD.,have documented all relevant documentation on the behalf of Jodelle Red, MD,as directed by  Jodelle Red, MD while in the presence of Jodelle Red, MD.  I, Jodelle Red, MD, have reviewed all documentation for this visit. The documentation on 05/06/23 for the exam, diagnosis, procedures, and orders are all accurate and complete.   Signed, Jodelle Red, MD

## 2023-05-08 ENCOUNTER — Telehealth: Payer: Self-pay

## 2023-05-08 ENCOUNTER — Other Ambulatory Visit (HOSPITAL_BASED_OUTPATIENT_CLINIC_OR_DEPARTMENT_OTHER): Payer: Self-pay

## 2023-05-08 ENCOUNTER — Other Ambulatory Visit (HOSPITAL_COMMUNITY): Payer: Self-pay

## 2023-05-08 ENCOUNTER — Encounter (HOSPITAL_BASED_OUTPATIENT_CLINIC_OR_DEPARTMENT_OTHER): Payer: Self-pay

## 2023-05-08 NOTE — Telephone Encounter (Signed)
PA initiated, please see separate encounter for updates on determination. (I will route you back in once a decision has been made)  Dontasia Miranda, CPhT Pharmacy Patient Advocate Specialist Direct Number: [phone] Fax: [phone]  

## 2023-05-08 NOTE — Telephone Encounter (Signed)
Pharmacy Patient Advocate Encounter   Received notification from [facility] that prior authorization for [facility] is needed.    PA submitted on 05/08/23 Key [id number] Status is pending  Haze Rushing, CPhT Pharmacy Patient Advocate Specialist Direct Number: [phone] Fax: [phone]

## 2023-05-10 NOTE — Telephone Encounter (Signed)
Pharmacy Patient Advocate Encounter  Prior Authorization for [facility] has been approved.    Effective dates: UNTIL 11/05/23  Haze Rushing, CPhT Pharmacy Patient Advocate Specialist Direct Number: [phone] Fax: [phone]

## 2023-05-19 ENCOUNTER — Encounter (HOSPITAL_BASED_OUTPATIENT_CLINIC_OR_DEPARTMENT_OTHER): Payer: Self-pay

## 2023-05-19 ENCOUNTER — Other Ambulatory Visit (HOSPITAL_BASED_OUTPATIENT_CLINIC_OR_DEPARTMENT_OTHER): Payer: Self-pay

## 2023-05-20 ENCOUNTER — Other Ambulatory Visit: Payer: Self-pay | Admitting: Cardiology

## 2023-05-20 ENCOUNTER — Other Ambulatory Visit (HOSPITAL_BASED_OUTPATIENT_CLINIC_OR_DEPARTMENT_OTHER): Payer: Self-pay

## 2023-05-20 ENCOUNTER — Other Ambulatory Visit: Payer: Self-pay

## 2023-05-30 DIAGNOSIS — R561 Post traumatic seizures: Secondary | ICD-10-CM | POA: Diagnosis not present

## 2023-05-30 DIAGNOSIS — I5032 Chronic diastolic (congestive) heart failure: Secondary | ICD-10-CM | POA: Diagnosis not present

## 2023-05-30 DIAGNOSIS — Z951 Presence of aortocoronary bypass graft: Secondary | ICD-10-CM | POA: Diagnosis not present

## 2023-06-15 ENCOUNTER — Other Ambulatory Visit (HOSPITAL_BASED_OUTPATIENT_CLINIC_OR_DEPARTMENT_OTHER): Payer: Self-pay

## 2023-06-30 DIAGNOSIS — Z951 Presence of aortocoronary bypass graft: Secondary | ICD-10-CM | POA: Diagnosis not present

## 2023-06-30 DIAGNOSIS — I5032 Chronic diastolic (congestive) heart failure: Secondary | ICD-10-CM | POA: Diagnosis not present

## 2023-06-30 DIAGNOSIS — R561 Post traumatic seizures: Secondary | ICD-10-CM | POA: Diagnosis not present

## 2023-07-10 ENCOUNTER — Other Ambulatory Visit (HOSPITAL_BASED_OUTPATIENT_CLINIC_OR_DEPARTMENT_OTHER): Payer: Self-pay

## 2023-07-31 DIAGNOSIS — R561 Post traumatic seizures: Secondary | ICD-10-CM | POA: Diagnosis not present

## 2023-07-31 DIAGNOSIS — I5032 Chronic diastolic (congestive) heart failure: Secondary | ICD-10-CM | POA: Diagnosis not present

## 2023-07-31 DIAGNOSIS — Z951 Presence of aortocoronary bypass graft: Secondary | ICD-10-CM | POA: Diagnosis not present

## 2023-08-07 ENCOUNTER — Other Ambulatory Visit (HOSPITAL_BASED_OUTPATIENT_CLINIC_OR_DEPARTMENT_OTHER): Payer: Self-pay

## 2023-08-16 ENCOUNTER — Encounter (HOSPITAL_BASED_OUTPATIENT_CLINIC_OR_DEPARTMENT_OTHER): Payer: Self-pay | Admitting: Cardiology

## 2023-08-16 ENCOUNTER — Ambulatory Visit (HOSPITAL_BASED_OUTPATIENT_CLINIC_OR_DEPARTMENT_OTHER): Payer: Medicare PPO | Admitting: Cardiology

## 2023-08-16 VITALS — BP 94/64 | HR 93 | Ht 72.0 in | Wt 338.3 lb

## 2023-08-16 DIAGNOSIS — Z716 Tobacco abuse counseling: Secondary | ICD-10-CM | POA: Diagnosis not present

## 2023-08-16 DIAGNOSIS — I255 Ischemic cardiomyopathy: Secondary | ICD-10-CM

## 2023-08-16 DIAGNOSIS — I252 Old myocardial infarction: Secondary | ICD-10-CM

## 2023-08-16 DIAGNOSIS — Z951 Presence of aortocoronary bypass graft: Secondary | ICD-10-CM

## 2023-08-16 DIAGNOSIS — F17210 Nicotine dependence, cigarettes, uncomplicated: Secondary | ICD-10-CM | POA: Diagnosis not present

## 2023-08-16 DIAGNOSIS — Z720 Tobacco use: Secondary | ICD-10-CM | POA: Diagnosis not present

## 2023-08-16 DIAGNOSIS — I2510 Atherosclerotic heart disease of native coronary artery without angina pectoris: Secondary | ICD-10-CM

## 2023-08-16 DIAGNOSIS — E782 Mixed hyperlipidemia: Secondary | ICD-10-CM

## 2023-08-16 NOTE — Patient Instructions (Addendum)
Medication Instructions:  Your physician recommends that you continue on your current medications as directed. Please refer to the Current Medication list given to you today.  *If you need a refill on your cardiac medications before your next appointment, please call your pharmacy*  Lab Work: NONE  Testing/Procedures: NONE  Follow-Up: 11/20/2023 4:20 with Dr Linnell Fulling, [facility] will reach out to you for smoking cessation

## 2023-08-16 NOTE — Progress Notes (Signed)
Cardiology Office Note:  .    Date:  08/16/2023  ID:  James Torres, DOB [birth year], MRN [id number] PCP: Daisy Floro, MD  [facility] Providers Cardiologist:  Jodelle Red, MD     History of Present Illness: .    James Torres is a 57 y.o. male with a hx of CAD (NSTEMI s/p PCI and stenting of the circumflex with a 38 mm long Synergy DES 2016; CABG x 1 LIMA to LAD 11/13/2022), ICM (03/2015 EF 35-45% with recovered EF of 50-55% following PCI -> subsequent chronic diastolic CHF), carotid artery disease, hypertension, hyperlipidemia, GERD, OSA on CPAP, s/p resection of epidermoid brain tumor, here for follow-up.    Cardiac history: He had presented to the ED on 11/10/22 with chest pain, relieved after taking 2 nitroglycerin at home. At the ER, EKG showed mild ST segment depression in inferior leads and nonspecific ST T wave changes in the inferior leads.  Initial troponin 177 > 4400.  He underwent heart catheterization which demonstrated 99% stenosis of the proximal LAD, not felt to be suitable for PCI.  He underwent CABG x 1, LIMA to LAD.  He was volume overloaded requiring diuresis, CPAP, HFNC 15 LPM. Discharged home with PT and OT was initially recommended, however he was slow to progress and eventually the decision was made for inpatient rehab at [facility]. He was admitted to inpatient rehab on 11/23/2022 to 11/30/2022 and was eventually discharged home.   Seen in cardiology by Wallis Bamberg, NP 12/10/2022 where he was struggling with fatigue and migraines. Also had been struggling with smoking cessation. Hadn't smoked since 11/10/22 but noted worsening cravings since returning home. Complained of some shortness of breath with prolonged walking. Lipids 11/10/22 showed LDL 71 on rosuvastatin 40 mg daily. He was started on Zetia 10 mg daily.   At his visit 05/2023, he complained of ongoing chest pain/soreness in his left and right chest/pectoral regions. Examination today was consistent  with the presence of several muscle knots. No deep chest pain since he was in the hospital. After 3 weeks at home from the hospital he started smoking again, although not as much as before. Down between 1/2 pack and 3/4 pack a day. He had been wanting to quit. He was interested in pursuing weight loss, which we discussed including alternatives such as Ozempic. On seizure medications, has neurologist at [facility]. Recently added extra strength acetaminophen for chronic migraines. Previously took [place], which was discontinued while he was in the hospital. In the past he had tried Botox, which he planned to restart.   Today, he is accompanied by a family member. He states he has been feeling alright but continues to struggle with ongoing chest pain mostly localized along his CABG incision. The chest pain is worse in the mornings after waking up, and then persists throughout the day. Severity will vary. He also notes that the skin near his incision will sometimes appear puffy. Lately, he is mostly limited to sitting in the recliner couch. His physical activity is limited as multiple kinds of movement or positions will exacerbate his chest pain.  Additionally he complains of chronic daily migraines/headaches. His migraines are always localized to his left head, and his headaches are located more occipitally. For treatment he may take up to 3 extra strength tylenol at a time, and takes percocet as a last resort. Previously he received Botox injections which were working, but he has not followed up for this as he started concentrating on  his hip issues.   At times he has also noted some lightheadedness and nausea. Occasionally his nausea is severe and he is only able to stomach a small milkshake/protein shake. At other times he may feel very bloated. [name] has been helping to curb his appetite; he has cut back on late-night snacking.  Of note, he has resumed smoking about 14 cigarettes a day or 3/4 of a  pack.  He denies any palpitations, shortness of breath, peripheral edema, orthopnea, or PND. No melena or hematochezia.  ROS:  Please see the history of present illness. ROS otherwise negative except as noted.  (+) Chest pain near CABG incision. (+) Chronic migraines, headaches (+) Lightheadedness (+) Nausea (+) Abdominal bloating  Studies Reviewed: [name]           Physical Exam:    VS:  BP 94/64 (BP Location: Left Arm, Patient Position: Sitting, Cuff Size: Large)   Pulse 93   Ht 6' (1.829 m)   Wt (!) 338 lb 4.8 oz (153.5 kg)   SpO2 96%   BMI 45.88 kg/m    Wt Readings from Last 3 Encounters:  08/16/23 (!) 338 lb 4.8 oz (153.5 kg)  05/06/23 (!) 364 lb (165.1 kg)  01/16/23 (!) 370 lb (167.8 kg)    GEN: Well nourished, well developed in no acute distress HEENT: Normal, moist mucous membranes NECK: No JVD CARDIAC: regular rhythm, normal S1 and S2, no rubs or gallops. No murmur. VASCULAR: Radial and DP pulses 2+ bilaterally. No carotid bruits RESPIRATORY:  Clear to auscultation without rales, wheezing or rhonchi  ABDOMEN: Soft, non-tender, non-distended MUSCULOSKELETAL:  Ambulates independently SKIN: Warm and dry, no edema NEUROLOGIC:  Alert and oriented x 3. No focal neuro deficits noted. PSYCHIATRIC:  Normal affect   ASSESSMENT AND PLAN: .    CAD with prior PCI, s/p CABG x1 11/2022 History of NSTEMI Mixed hyperlipidemia Prior ischemic cardiomyopathy with history of recovered EF -echo with anterior wall motion worse than prior, EF 45-50% prior to CABG -continue metoprolol -continue aspirin, clopidogrel (presented with NSTEMI) until 11/2023 -continue rosuvastatin, last LDL 52 -Wegovy for secondary prevention of ASCVD -Post-surgical neurologic pain.  Encouraged to discuss with neurology.  Ischemic cardiomyopathy:  -EF 45-50% prior to CABG -on metoprolol -no BP room for additional medications at this time, BP low today but he is asymptomatic -have not rechecked post CABG,  discussed, would not change management right now but if BP remains low would recheck at follow up   Tobacco abuse counseling: The patient was counseled on tobacco cessation today for 4 minutes.  Counseling included reviewing the risks of smoking tobacco products, how it impacts the patient's current medical diagnoses and different strategies for quitting.  Pharmacotherapy to aid in tobacco cessation was not prescribed today. Refer to care guide for smoking cessation.   Obesity:  -BMI 49->45, on wegovy for secondary prevention with added benefit of weight loss  Dispo: Follow-up in 3 months, or sooner as needed.  I,Mathew Stumpf,acting as a Neurosurgeon for [employer], MD.,have documented all relevant documentation on the behalf of Jodelle Red, MD,as directed by  Jodelle Red, MD while in the presence of Jodelle Red, MD.  I, Jodelle Red, MD, have reviewed all documentation for this visit. The documentation on 10/06/23 for the exam, diagnosis, procedures, and orders are all accurate and complete.   Signed, Jodelle Red, MD

## 2023-08-19 ENCOUNTER — Telehealth: Payer: Self-pay

## 2023-08-19 DIAGNOSIS — Z0000 Encounter for general adult medical examination without abnormal findings: Secondary | ICD-10-CM

## 2023-08-19 NOTE — Telephone Encounter (Signed)
Called patient per referral for smoking cessation. Patient did not answer. Left message for patient to return call.      Renaee Munda, MS, ERHD, [facility]  Care Guide, Health & Wellness Coach [address] [place] [address] Telephone: [phone] Email: [email or web address]

## 2023-08-26 DIAGNOSIS — G4733 Obstructive sleep apnea (adult) (pediatric): Secondary | ICD-10-CM | POA: Diagnosis not present

## 2023-08-30 DIAGNOSIS — I5032 Chronic diastolic (congestive) heart failure: Secondary | ICD-10-CM | POA: Diagnosis not present

## 2023-08-30 DIAGNOSIS — R561 Post traumatic seizures: Secondary | ICD-10-CM | POA: Diagnosis not present

## 2023-08-30 DIAGNOSIS — Z951 Presence of aortocoronary bypass graft: Secondary | ICD-10-CM | POA: Diagnosis not present

## 2023-09-04 ENCOUNTER — Encounter (HOSPITAL_BASED_OUTPATIENT_CLINIC_OR_DEPARTMENT_OTHER): Payer: Self-pay

## 2023-09-05 ENCOUNTER — Other Ambulatory Visit (HOSPITAL_BASED_OUTPATIENT_CLINIC_OR_DEPARTMENT_OTHER): Payer: Self-pay

## 2023-09-11 ENCOUNTER — Telehealth: Payer: Self-pay

## 2023-09-11 DIAGNOSIS — Z0000 Encounter for general adult medical examination without abnormal findings: Secondary | ICD-10-CM

## 2023-09-11 NOTE — Telephone Encounter (Signed)
Called patient per health coaching referral from Dr. Cristal Deer for smoking cessation. Patient is interested in health coaching and is scheduled for his initial session over the phone for 11-11 at 2:00pm. Patient will be called at that time.    Renaee Munda, MS, ERHD, [facility]  Care Guide, Health & Wellness Coach [address], [address] [place] [address] Telephone: [phone] Email: [email or web address]

## 2023-09-16 ENCOUNTER — Telehealth: Payer: Self-pay

## 2023-09-16 ENCOUNTER — Ambulatory Visit: Payer: Medicare PPO

## 2023-09-16 DIAGNOSIS — Z0000 Encounter for general adult medical examination without abnormal findings: Secondary | ICD-10-CM

## 2023-09-16 NOTE — Telephone Encounter (Signed)
Called patient to reschedule cancelled missed health coaching session. Patient stated that he has a migraine and wasn't able to hold the session today. Patient has been rescheduled for 11/18 at 3:00pm. Patient will be called at that time.    Renaee Munda, MS, ERHD, [facility]  Care Guide, Health & Wellness Coach [address] [place] [address] Telephone: [phone] Email: [email or web address]

## 2023-09-23 ENCOUNTER — Ambulatory Visit: Payer: Medicare PPO

## 2023-09-23 ENCOUNTER — Telehealth: Payer: Self-pay | Admitting: Licensed Clinical Social Worker

## 2023-09-23 DIAGNOSIS — R634 Abnormal weight loss: Secondary | ICD-10-CM | POA: Diagnosis not present

## 2023-09-23 DIAGNOSIS — F411 Generalized anxiety disorder: Secondary | ICD-10-CM | POA: Diagnosis not present

## 2023-09-23 DIAGNOSIS — F324 Major depressive disorder, single episode, in partial remission: Secondary | ICD-10-CM | POA: Diagnosis not present

## 2023-09-23 DIAGNOSIS — Z23 Encounter for immunization: Secondary | ICD-10-CM | POA: Diagnosis not present

## 2023-09-23 DIAGNOSIS — G43909 Migraine, unspecified, not intractable, without status migrainosus: Secondary | ICD-10-CM | POA: Diagnosis not present

## 2023-09-23 DIAGNOSIS — M940 Chondrocostal junction syndrome [Tietze]: Secondary | ICD-10-CM | POA: Diagnosis not present

## 2023-09-23 DIAGNOSIS — Z6841 Body Mass Index (BMI) 40.0 and over, adult: Secondary | ICD-10-CM | POA: Diagnosis not present

## 2023-09-23 NOTE — Telephone Encounter (Signed)
CSW contacted patient to inform that Health Coaching appointment today will be cancelled. HC will contact patient to rescheduled. Message left. Lasandra Beech, LCSW, CCSW-MCS [phone]

## 2023-09-24 ENCOUNTER — Telehealth: Payer: Self-pay

## 2023-09-24 DIAGNOSIS — Z0000 Encounter for general adult medical examination without abnormal findings: Secondary | ICD-10-CM

## 2023-09-24 NOTE — Telephone Encounter (Signed)
Called patient to reschedule health coaching appointment from 11/18 due to me being out of the office. Patient did not answer. Left message for patient to return call.   Renaee Munda, MS, ERHD, [facility]  Care Guide, Health & Wellness Coach [address], [address] [place] [address] Telephone: [phone] Email: [email or web address]

## 2023-09-30 DIAGNOSIS — R561 Post traumatic seizures: Secondary | ICD-10-CM | POA: Diagnosis not present

## 2023-09-30 DIAGNOSIS — I5032 Chronic diastolic (congestive) heart failure: Secondary | ICD-10-CM | POA: Diagnosis not present

## 2023-09-30 DIAGNOSIS — Z951 Presence of aortocoronary bypass graft: Secondary | ICD-10-CM | POA: Diagnosis not present

## 2023-10-05 ENCOUNTER — Other Ambulatory Visit (HOSPITAL_BASED_OUTPATIENT_CLINIC_OR_DEPARTMENT_OTHER): Payer: Self-pay

## 2023-10-06 ENCOUNTER — Encounter (HOSPITAL_BASED_OUTPATIENT_CLINIC_OR_DEPARTMENT_OTHER): Payer: Self-pay | Admitting: Cardiology

## 2023-10-08 ENCOUNTER — Telehealth: Payer: Self-pay

## 2023-10-08 DIAGNOSIS — Z0000 Encounter for general adult medical examination without abnormal findings: Secondary | ICD-10-CM

## 2023-10-08 NOTE — Telephone Encounter (Signed)
Called patient to reschedule health coaching session on 11/18 due to being out of the office. Patient did not answer. Left message to return phone call to reschedule appointment.    Renaee Munda, MS, ERHD, [facility]  Care Guide, Health & Wellness Coach [address] [place] [address] Telephone: [phone] Email: [email or web address]

## 2023-10-24 ENCOUNTER — Other Ambulatory Visit: Payer: Self-pay | Admitting: Cardiology

## 2023-10-30 DIAGNOSIS — R561 Post traumatic seizures: Secondary | ICD-10-CM | POA: Diagnosis not present

## 2023-10-30 DIAGNOSIS — Z951 Presence of aortocoronary bypass graft: Secondary | ICD-10-CM | POA: Diagnosis not present

## 2023-10-30 DIAGNOSIS — I5032 Chronic diastolic (congestive) heart failure: Secondary | ICD-10-CM | POA: Diagnosis not present

## 2023-11-20 ENCOUNTER — Ambulatory Visit (HOSPITAL_BASED_OUTPATIENT_CLINIC_OR_DEPARTMENT_OTHER): Payer: Medicare PPO | Admitting: Cardiology

## 2023-11-25 ENCOUNTER — Telehealth (HOSPITAL_BASED_OUTPATIENT_CLINIC_OR_DEPARTMENT_OTHER): Payer: Self-pay | Admitting: Cardiology

## 2023-11-25 DIAGNOSIS — E782 Mixed hyperlipidemia: Secondary | ICD-10-CM

## 2023-11-25 NOTE — Telephone Encounter (Signed)
*  STAT* If patient is at the pharmacy, call can be transferred to refill team.   1. Which medications need to be refilled? (please list name of each medication and dose if known) Ezetimibe  2. Would you like to learn more about the convenience, safety, & potential cost savings by using the [facility]?     3. Are you open to using the [facility] (Type [facility].    4. Which pharmacy/location (including street and city if local pharmacy) is medication to be sent to?[facility] [address], [place], [place]   5. Do they need a 30 day or 90 day supply? 90 days and refills

## 2023-11-26 ENCOUNTER — Other Ambulatory Visit (HOSPITAL_BASED_OUTPATIENT_CLINIC_OR_DEPARTMENT_OTHER): Payer: Self-pay

## 2023-11-26 MED ORDER — EZETIMIBE 10 MG PO TABS: 10.0000 mg | ORAL_TABLET | Freq: Every day | ORAL | 0 refills | Status: DC

## 2023-11-26 NOTE — Telephone Encounter (Signed)
30 day Rx sent as has upcoming visit. Future refills can be provided at upcoming visit . MyChart message sent to make patient aware.   Alver Sorrow, NP

## 2023-11-30 DIAGNOSIS — I5032 Chronic diastolic (congestive) heart failure: Secondary | ICD-10-CM | POA: Diagnosis not present

## 2023-11-30 DIAGNOSIS — R561 Post traumatic seizures: Secondary | ICD-10-CM | POA: Diagnosis not present

## 2023-11-30 DIAGNOSIS — Z951 Presence of aortocoronary bypass graft: Secondary | ICD-10-CM | POA: Diagnosis not present

## 2023-12-02 ENCOUNTER — Other Ambulatory Visit (HOSPITAL_BASED_OUTPATIENT_CLINIC_OR_DEPARTMENT_OTHER): Payer: Self-pay

## 2023-12-02 ENCOUNTER — Ambulatory Visit (HOSPITAL_BASED_OUTPATIENT_CLINIC_OR_DEPARTMENT_OTHER): Payer: Medicare PPO | Admitting: Cardiology

## 2023-12-02 ENCOUNTER — Encounter (HOSPITAL_BASED_OUTPATIENT_CLINIC_OR_DEPARTMENT_OTHER): Payer: Self-pay | Admitting: Cardiology

## 2023-12-02 VITALS — BP 84/62 | HR 85 | Ht 72.0 in | Wt 303.8 lb

## 2023-12-02 DIAGNOSIS — Z951 Presence of aortocoronary bypass graft: Secondary | ICD-10-CM

## 2023-12-02 DIAGNOSIS — Z716 Tobacco abuse counseling: Secondary | ICD-10-CM

## 2023-12-02 DIAGNOSIS — I959 Hypotension, unspecified: Secondary | ICD-10-CM | POA: Diagnosis not present

## 2023-12-02 DIAGNOSIS — F17210 Nicotine dependence, cigarettes, uncomplicated: Secondary | ICD-10-CM

## 2023-12-02 DIAGNOSIS — E782 Mixed hyperlipidemia: Secondary | ICD-10-CM

## 2023-12-02 DIAGNOSIS — I2510 Atherosclerotic heart disease of native coronary artery without angina pectoris: Secondary | ICD-10-CM | POA: Diagnosis not present

## 2023-12-02 DIAGNOSIS — I252 Old myocardial infarction: Secondary | ICD-10-CM

## 2023-12-02 DIAGNOSIS — L7682 Other postprocedural complications of skin and subcutaneous tissue: Secondary | ICD-10-CM | POA: Diagnosis not present

## 2023-12-02 DIAGNOSIS — I255 Ischemic cardiomyopathy: Secondary | ICD-10-CM | POA: Diagnosis not present

## 2023-12-02 MED ORDER — EZETIMIBE 10 MG PO TABS: 10.0000 mg | ORAL_TABLET | Freq: Every day | ORAL | 3 refills | Status: DC

## 2023-12-02 NOTE — Progress Notes (Signed)
Cardiology Office Note:  .    Date:  12/02/2023  ID:  [name], DOB 06/01/1966, MRN [id number] PCP: James Floro, MD  [facility] Providers Cardiologist:  James Red, MD     History of Present Illness: .    James Torres is a 58 y.o. male with a hx of CAD (NSTEMI s/p PCI and stenting of the circumflex with a 38 mm long Synergy DES 2016; CABG x 1 LIMA to LAD 11/13/2022), ICM (03/2015 EF 35-45% with recovered EF of 50-55% following PCI -> subsequent chronic diastolic CHF), carotid artery disease, hypertension, hyperlipidemia, GERD, OSA on CPAP, s/p resection of epidermoid brain tumor, here for follow-up.    Cardiac history: He had presented to the ED on 11/10/22 with chest pain, relieved after taking 2 nitroglycerin at home. At the ER, EKG showed mild ST segment depression in inferior leads and nonspecific ST T wave changes in the inferior leads.  Initial troponin 177 > 4400.  He underwent heart catheterization which demonstrated 99% stenosis of the proximal LAD, not felt to be suitable for PCI.  He underwent CABG x 1, LIMA to LAD.  He was volume overloaded requiring diuresis, CPAP, HFNC 15 LPM. Discharged home with PT and OT was initially recommended, however he was slow to progress and eventually the decision was made for inpatient rehab at [facility]. He was admitted to inpatient rehab on 11/23/2022 to 11/30/2022 and was eventually discharged home.  Today: Continues to have sensitivity to touch across CABG scar, tenderness across bilateral upper ribs and collarbone. Hasn't seen his neurologist yet. Has been sleeping in a recliner as he is typically a stomach sleeper, hasn't slept this way since his surgery. Eases when he is moving around in the morning, then soreness worsens throughout the day. Trying topical lidocaine but has not been sticking/covering well, discussed topical cream/jelly. Also discussed capsacin, voltaren, how to use these. Uses heating pad as well with some  improvement. Had previously recommended discussing nerve treatment medications/options with his specialists, has not seen them yet.  Will need his hip replaced at some point in the future, though this is on hold.  Continues to have lightheadedness, no syncope. Blood pressure remains low. Migraines have been terrible. Pending follow up with both his migraine specialist and his seizure neurologist. Has been having intermittent seizures since surgery, stressed the importance of seeing his specialists to discuss management of this.  Still smoking, down to 12 cigarettes/day, working with James Torres our Control and instrumentation engineer.  Denies shortness of breath at rest or with normal exertion. No PND, orthopnea, LE edema or unexpected weight gain. No syncope or palpitations. ROS otherwise negative except as noted.   ROS:  Please see the history of present illness. ROS otherwise negative except as noted.   Studies Reviewed: [name]    EKG Interpretation Date/Time:  Monday December 02 2023 11:59:29 EST Ventricular Rate:  86 PR Interval:  188 QRS Duration:  76 QT Interval:  380 QTC Calculation: 454 R Axis:   41  Text Interpretation: Normal sinus rhythm Low voltage QRS Septal infarct (cited on or before 02-Dec-2023) Confirmed by James Torres [phone]) on 12/02/2023 12:38:07 PM      Physical Exam:    VS:  BP (!) 84/62   Pulse 85   Ht 6' (1.829 m)   Wt (!) 303 lb 12.8 oz (137.8 kg)   SpO2 96%   BMI 41.20 kg/m    Wt Readings from Last 3 Encounters:  12/02/23 (!) 303 lb  12.8 oz (137.8 kg)  08/16/23 (!) 338 lb 4.8 oz (153.5 kg)  05/06/23 (!) 364 lb (165.1 kg)    GEN: Well nourished, well developed in no acute distress HEENT: Normal, moist mucous membranes NECK: No JVD CARDIAC: regular rhythm, normal S1 and S2, no rubs or gallops. No murmur. Tender to palpation across chest, especially along sternal scar VASCULAR: Radial and DP pulses 2+ bilaterally. No carotid bruits RESPIRATORY:  Clear to auscultation without  rales, wheezing or rhonchi  ABDOMEN: Soft, non-tender, non-distended MUSCULOSKELETAL:  Ambulates independently SKIN: Warm and dry, no edema NEUROLOGIC:  Alert and oriented x 3. No focal neuro deficits noted. PSYCHIATRIC:  Normal affect   ASSESSMENT AND PLAN: .    Lightheadedness Hypotension -no syncope -appetite and thirst both low on Wegovy, discussed importance of hydration -was still taking furosemide daily, stopped today, this is likely contributing to dehydration -instructed on taking home BP, symptoms to watch for -if persists, may need to stop metoprolol. On no other potential hypotensive agents  CAD with prior PCI, s/p CABG x1 11/2022 History of NSTEMI Mixed hyperlipidemia Prior ischemic cardiomyopathy with history of recovered EF -echo with anterior wall motion worse than prior, EF 45-50% prior to CABG -continue metoprolol -was on aspirin, clopidogrel (presented with NSTEMI) until 11/2023. Has been 12 months, will stop clopidogrel and continue aspirin indefinitely -continue rosuvastatin and ezetimibe, last LDL 52 -Wegovy for secondary prevention of ASCVD -Post-surgical neurologic pain.  Encouraged to discuss with neurology. We discussed topical options today.  Ischemic cardiomyopathy Persistent hypotension -EF 45-50% prior to CABG -on metoprolol -no BP room for additional medications at this time, BP low today but he is asymptomatic -given that hypotension has persisted, will recheck echo to rule out potential cardiac etiologies   Tobacco abuse counseling: The patient was counseled on tobacco cessation today for 3 minutes.  Counseling included reviewing the risks of smoking tobacco products, how it impacts the patient's current medical diagnoses and different strategies for quitting.  Pharmacotherapy to aid in tobacco cessation was not prescribed today. Working with care guide for smoking cessation.   Obesity:  -BMI 49->45->41, on wegovy for secondary prevention with  added benefit of weight loss -has 92 lbs from prior to his admission for MI, down about 60-65 lbs since we started meds -appetite has been low, as had thirst. Discussed hydration as above  OSA on CPAP: -last followed by his neurologist Dr. Inez Catalina -has been having issues with his machine (through [employer]), needs an update sleep study -instructed that if he has issues getting things ordered/sorted out, to let us know and we will get him established with our sleep clinic  Dispo: Follow-up in 3 months, or sooner as needed.  Total time of encounter: I spent 64 minutes dedicated to the care of this patient on the date of this encounter to include pre-visit review of records, face-to-face time with the patient discussing conditions above, and clinical documentation with the electronic health record. We specifically spent time today discussing hypotension, medications, neurologic co-morbidities, chest pain (nerve and MSK/joint).   Signed, James Red, MD

## 2023-12-02 NOTE — Patient Instructions (Addendum)
Medication Instructions:  Your physician has recommended you make the following change in your medication:  STOP Plavix STOP Lasix; take ONLY if retaining fluid CONTINUE Asprin  Try either lidocaine or voltaren (don't mix) for the chest pain  *If you need a refill on your cardiac medications before your next appointment, please call your pharmacy*  Testing/Procedures: Your physician has requested that you have an echocardiogram. Echocardiography is a painless test that uses sound waves to create images of your heart. It provides your doctor with information about the size and shape of your heart and how well your heart's chambers and valves are working. This procedure takes approximately one hour. There are no restrictions for this procedure. Please do NOT wear cologne, perfume, aftershave, or lotions (deodorant is allowed). Please arrive 15 minutes prior to your appointment time.  Please note: We ask at that you not bring children with you during ultrasound (echo/ vascular) testing. Due to room size and safety concerns, children are not allowed in the ultrasound rooms during exams. Our front office staff cannot provide observation of children in our lobby area while testing is being conducted. An adult accompanying a patient to their appointment will only be allowed in the ultrasound room at the discretion of the ultrasound technician under special circumstances. We apologize for any inconvenience.  Follow-Up: At [facility], you and your health needs are our priority.  As part of our continuing mission to provide you with exceptional heart care, we have created designated Provider Care Teams.  These Care Teams include your primary Cardiologist (physician) and Advanced Practice Providers (APPs -  Physician Assistants and Nurse Practitioners) who all work together to provide you with the care you need, when you need it.  We recommend signing up for the patient portal called "MyChart".   Sign up information is provided on this After Visit Summary.  MyChart is used to connect with patients for Virtual Visits (Telemedicine).  Patients are able to view lab/test results, encounter notes, upcoming appointments, etc.  Non-urgent messages can be sent to your provider as well.   To learn more about what you can do with MyChart, go to [email or web address].    Your next appointment:   3 month(s)  Provider:   Jodelle Red, MD    Other Instructions Reach out to Dr. Inez Catalina regarding seizures, sleep study/CPAP, possible nerve medications that might help with the chest pain

## 2023-12-12 DIAGNOSIS — G4733 Obstructive sleep apnea (adult) (pediatric): Secondary | ICD-10-CM | POA: Diagnosis not present

## 2023-12-18 ENCOUNTER — Telehealth: Payer: Self-pay | Admitting: Cardiology

## 2023-12-18 NOTE — Telephone Encounter (Signed)
*  STAT* If patient is at the pharmacy, call can be transferred to refill team.   1. Which medications need to be refilled? (please list name of each medication and dose if known)   metoprolol tartrate (LOPRESSOR) 25 MG tablet   2. Which pharmacy/location (including street and city if local pharmacy) is medication to be sent to? [id number] [place], [place] - [address] Phone: [phone]  Fax: [phone]     3. Do they need a 30 day or 90 day supply? 90

## 2023-12-19 ENCOUNTER — Other Ambulatory Visit (HOSPITAL_BASED_OUTPATIENT_CLINIC_OR_DEPARTMENT_OTHER): Payer: Medicare PPO

## 2023-12-23 ENCOUNTER — Other Ambulatory Visit (HOSPITAL_BASED_OUTPATIENT_CLINIC_OR_DEPARTMENT_OTHER): Payer: Self-pay

## 2023-12-25 ENCOUNTER — Encounter (HOSPITAL_BASED_OUTPATIENT_CLINIC_OR_DEPARTMENT_OTHER): Payer: Self-pay

## 2023-12-26 ENCOUNTER — Other Ambulatory Visit (HOSPITAL_COMMUNITY): Payer: Self-pay

## 2023-12-26 ENCOUNTER — Telehealth (HOSPITAL_BASED_OUTPATIENT_CLINIC_OR_DEPARTMENT_OTHER): Payer: Self-pay | Admitting: *Deleted

## 2023-12-26 ENCOUNTER — Other Ambulatory Visit (HOSPITAL_BASED_OUTPATIENT_CLINIC_OR_DEPARTMENT_OTHER): Payer: Self-pay | Admitting: Cardiology

## 2023-12-26 ENCOUNTER — Other Ambulatory Visit (HOSPITAL_BASED_OUTPATIENT_CLINIC_OR_DEPARTMENT_OTHER): Payer: Self-pay

## 2023-12-26 ENCOUNTER — Telehealth: Payer: Self-pay | Admitting: Pharmacy Technician

## 2023-12-26 NOTE — Telephone Encounter (Signed)
 Received notification from [facility] that Prior Authorization for wegovy has been APPROVED from 11/06/23 to 11/04/24. I am asking our pharmacy to fill it now. Should be $179.24 Key: [id number]  Mychart message to patient

## 2023-12-26 NOTE — Telephone Encounter (Addendum)
 Pharmacy Patient Advocate Encounter  Received notification from [facility] that Prior Authorization for wegovy has been APPROVED from 11/06/23 to 11/04/24. I am asking our pharmacy to fill it now. Should be $179.24 Key: [id number]

## 2023-12-26 NOTE — Telephone Encounter (Signed)
 Dr. Cristal Deer,   I got a letter from [facility] saying [facility] isn't covered and I should contact you and ask if you if I should:   1. Switch to a new medicine  2. Request a prior authorization showing I meet their criteria for coverage, or  3. request an exception on how they cover this medication   How do we proceed with this and is there anything I need to do?   My refill needs to be picked up this week/weekend for my Monday dose.   Thank you,  James Torres   Above message sent to Prior authorization team to initiate PA

## 2024-01-13 ENCOUNTER — Ambulatory Visit (INDEPENDENT_AMBULATORY_CARE_PROVIDER_SITE_OTHER): Payer: Medicare PPO

## 2024-01-13 DIAGNOSIS — I2510 Atherosclerotic heart disease of native coronary artery without angina pectoris: Secondary | ICD-10-CM

## 2024-01-13 DIAGNOSIS — I959 Hypotension, unspecified: Secondary | ICD-10-CM

## 2024-01-13 LAB — ECHOCARDIOGRAM COMPLETE
Area-P 1/2: 4.71 cm2
S' Lateral: 3.76 cm

## 2024-01-23 DIAGNOSIS — R634 Abnormal weight loss: Secondary | ICD-10-CM | POA: Diagnosis not present

## 2024-01-23 DIAGNOSIS — I959 Hypotension, unspecified: Secondary | ICD-10-CM | POA: Diagnosis not present

## 2024-01-23 DIAGNOSIS — G4733 Obstructive sleep apnea (adult) (pediatric): Secondary | ICD-10-CM | POA: Diagnosis not present

## 2024-01-23 DIAGNOSIS — Z6839 Body mass index (BMI) 39.0-39.9, adult: Secondary | ICD-10-CM | POA: Diagnosis not present

## 2024-02-05 DIAGNOSIS — G4733 Obstructive sleep apnea (adult) (pediatric): Secondary | ICD-10-CM | POA: Diagnosis not present

## 2024-03-03 ENCOUNTER — Encounter (HOSPITAL_BASED_OUTPATIENT_CLINIC_OR_DEPARTMENT_OTHER): Payer: Self-pay | Admitting: Cardiology

## 2024-03-03 ENCOUNTER — Ambulatory Visit (HOSPITAL_BASED_OUTPATIENT_CLINIC_OR_DEPARTMENT_OTHER): Payer: Medicare PPO | Admitting: Cardiology

## 2024-03-03 VITALS — BP 98/58 | HR 74 | Ht 72.0 in | Wt 289.2 lb

## 2024-03-03 DIAGNOSIS — G4733 Obstructive sleep apnea (adult) (pediatric): Secondary | ICD-10-CM | POA: Diagnosis not present

## 2024-03-03 DIAGNOSIS — Z716 Tobacco abuse counseling: Secondary | ICD-10-CM

## 2024-03-03 DIAGNOSIS — I2510 Atherosclerotic heart disease of native coronary artery without angina pectoris: Secondary | ICD-10-CM

## 2024-03-03 DIAGNOSIS — I959 Hypotension, unspecified: Secondary | ICD-10-CM

## 2024-03-03 DIAGNOSIS — F17210 Nicotine dependence, cigarettes, uncomplicated: Secondary | ICD-10-CM

## 2024-03-03 DIAGNOSIS — L7682 Other postprocedural complications of skin and subcutaneous tissue: Secondary | ICD-10-CM | POA: Diagnosis not present

## 2024-03-03 DIAGNOSIS — E782 Mixed hyperlipidemia: Secondary | ICD-10-CM

## 2024-03-03 DIAGNOSIS — I252 Old myocardial infarction: Secondary | ICD-10-CM | POA: Diagnosis not present

## 2024-03-03 DIAGNOSIS — I255 Ischemic cardiomyopathy: Secondary | ICD-10-CM | POA: Diagnosis not present

## 2024-03-03 DIAGNOSIS — Z951 Presence of aortocoronary bypass graft: Secondary | ICD-10-CM | POA: Diagnosis not present

## 2024-03-03 NOTE — Progress Notes (Signed)
 Cardiology Office Note:  .    Date:  03/03/2024  ID:  James Torres, DOB Oct 25, 1966, MRN [id number] PCP: Jimmey Mould, MD  [facility] Providers Cardiologist:  Sheryle Donning, MD     History of Present Illness: .    James Torres is a 58 y.o. male with a hx of CAD (NSTEMI s/p PCI and stenting of the circumflex with a 38 mm long Synergy DES 2016; CABG x 1 LIMA to LAD 11/13/2022), ICM (03/2015 EF 35-45% with recovered EF of 50-55% following PCI -> subsequent chronic diastolic CHF), carotid artery disease, hypertension, hyperlipidemia, GERD, OSA on CPAP, s/p resection of epidermoid brain tumor, here for follow-up.    Cardiac history: He had presented to the ED on 11/10/22 with chest pain, relieved after taking 2 nitroglycerin  at home. At the ER, EKG showed mild ST segment depression in inferior leads and nonspecific ST T wave changes in the inferior leads.  Initial troponin 177 > 4400.  He underwent heart catheterization which demonstrated 99% stenosis of the proximal LAD, not felt to be suitable for PCI.  He underwent CABG x 1, LIMA to LAD.  He was volume overloaded requiring diuresis, CPAP, HFNC 15 LPM. Discharged home with PT and OT was initially recommended, however he was slow to progress and eventually the decision was made for inpatient rehab at [facility]. He was admitted to inpatient rehab on 11/23/2022 to 11/30/2022 and was eventually discharged home.  Today: Still with sternal pain, tender to touch, using lidocaine  still with some improvement. Hasn't seen his migraine neurologist (Dr. Karlynn Oyster) yet to discuss if there are neuro meds that could help pain without worsening his migraines.  Reviewed his echo from 01/2024, EF 55-60%, RV normal, RAP 3, no significant valve disease.   Lightheadedness/off balance similar to prior. Has bad days and good days, he has not found a pattern to this. No syncope. Prior BP 84/62 at visit, discussed PO intake, stopped furosemide . Today 98/58,  discussed stopping metoprolol .   Continues to have seizures, but couldn't get appt with his seizure neurologist (Dr. Halford Levels) until November. Having seizures about once/day. Stressed to him to reach out to office to see if there is anyone who can see him sooner.   Continues to work on quitting smoking, was down to 11 cigarettes/day but then found himself increasing back up.   ROS:  Please see the history of present illness. ROS otherwise negative except as noted.   Studies Reviewed: Aaron Aas           Physical Exam:    VS:  BP (!) 98/58   Pulse 74   Ht 6' (1.829 m)   Wt 289 lb 3.2 oz (131.2 kg)   SpO2 95%   BMI 39.22 kg/m    Wt Readings from Last 3 Encounters:  03/03/24 289 lb 3.2 oz (131.2 kg)  12/02/23 (!) 303 lb 12.8 oz (137.8 kg)  08/16/23 (!) 338 lb 4.8 oz (153.5 kg)    GEN: Well nourished, well developed in no acute distress HEENT: Normal, moist mucous membranes NECK: No JVD CARDIAC: regular rhythm, normal S1 and S2, no rubs or gallops. No murmur. Tender to palpation across chest, especially along sternal scar VASCULAR: Radial and DP pulses 2+ bilaterally. No carotid bruits RESPIRATORY:  Clear to auscultation without rales, wheezing or rhonchi  ABDOMEN: Soft, non-tender, non-distended MUSCULOSKELETAL:  Ambulates independently SKIN: Warm and dry, no edema NEUROLOGIC:  Alert and oriented x 3. No focal neuro deficits noted. PSYCHIATRIC:  Normal affect   ASSESSMENT AND PLAN: .    Lightheadedness Hypotension -no syncope -appetite and thirst both low on Wegovy , discussed importance of hydration -previously stopped furosemide , stopping metoprolol  today -discussed slow position changes  CAD with prior PCI, s/p CABG x1 11/2022 History of NSTEMI Mixed hyperlipidemia Prior ischemic cardiomyopathy with recovered EF -echo 11/2022 with anterior wall motion worse than prior, EF 45-50% prior to CABG. Now recovered EF by echo 01/2024 -was on aspirin , clopidogrel  (presented with  NSTEMI) until 11/2023, now on aspirin  alone -continue rosuvastatin  and ezetimibe , last LDL 52 -Wegovy  for secondary prevention of ASCVD -Post-surgical neurologic pain.  Encouraged to discuss with neurology to see if there are meds that could help the nerve pain without increasing risk of migraines/seizures   Tobacco abuse counseling: The patient was counseled on tobacco cessation today for 4 minutes.  Counseling included reviewing the risks of smoking tobacco products, how it impacts the patient's current medical diagnoses and different strategies for quitting.  Pharmacotherapy to aid in tobacco cessation was not prescribed today.    Obesity:  -BMI 49->39, on wegovy  for secondary prevention with added benefit of weight loss -starting weight in the hospital was 395 lbs 11/2022, down >100 lbs today -appetite has been low, as had thirst. Discussed hydration as above  OSA on CPAP -now referred from his PCP's office  Seizures Migraines -followed by neurology at [facility]  Dispo: Follow-up in 3 months, or sooner as needed.  Total time of encounter: I spent 41 minutes dedicated to the care of this patient on the date of this encounter to include pre-visit review of records, face-to-face time with the patient discussing conditions above, and clinical documentation with the electronic health record. We specifically spent time today discussing blood pressure, symptoms, nerve pain management, review of his echo, signs/symptoms to watch for.   Signed, Sheryle Donning, MD

## 2024-03-03 NOTE — Patient Instructions (Signed)
 Medication Instructions:  Your physician has recommended you make the following change in your medication:   STOP Metoprolol   *If you need a refill on your cardiac medications before your next appointment, please call your pharmacy*  Follow-Up: At [facility], you and your health needs are our priority.  As part of our continuing mission to provide you with exceptional heart care, our providers are all part of one team.  This team includes your primary Cardiologist (physician) and Advanced Practice Providers or APPs (Physician Assistants and Nurse Practitioners) who all work together to provide you with the care you need, when you need it.  Please follow up in 3 months with Dr. Veryl Gottron, Slater Duncan, NP or Neomi Banks, NP   We recommend signing up for the patient portal called "MyChart".  Sign up information is provided on this After Visit Summary.  MyChart is used to connect with patients for Virtual Visits (Telemedicine).  Patients are able to view lab/test results, encounter notes, upcoming appointments, etc.  Non-urgent messages can be sent to your provider as well.   To learn more about what you can do with MyChart, go to [email or web address].

## 2024-03-27 DIAGNOSIS — G4733 Obstructive sleep apnea (adult) (pediatric): Secondary | ICD-10-CM | POA: Diagnosis not present

## 2024-04-06 ENCOUNTER — Encounter (HOSPITAL_BASED_OUTPATIENT_CLINIC_OR_DEPARTMENT_OTHER): Payer: Self-pay

## 2024-04-06 ENCOUNTER — Other Ambulatory Visit (HOSPITAL_BASED_OUTPATIENT_CLINIC_OR_DEPARTMENT_OTHER): Payer: Self-pay

## 2024-04-06 NOTE — Telephone Encounter (Signed)
**Note De-identified  [place] Obfuscation** Please advise 

## 2024-04-19 ENCOUNTER — Other Ambulatory Visit: Payer: Self-pay | Admitting: Cardiology

## 2024-04-20 ENCOUNTER — Telehealth (HOSPITAL_BASED_OUTPATIENT_CLINIC_OR_DEPARTMENT_OTHER): Payer: Self-pay | Admitting: Cardiology

## 2024-04-20 MED ORDER — ROSUVASTATIN CALCIUM 40 MG PO TABS: 40.0000 mg | ORAL_TABLET | Freq: Every day | ORAL | 3 refills | Status: AC

## 2024-04-20 NOTE — Telephone Encounter (Signed)
 *  STAT* If patient is at the pharmacy, call can be transferred to refill team.   1. Which medications need to be refilled? (please list name of each medication and dose if known) rosuvastatin  (CRESTOR ) 40 MG tablet    2. Would you like to learn more about the convenience, safety, & potential cost savings by using the [facility]? No      3. Are you open to using the [facility] (Type [facility]. ). No    4. Which pharmacy/location (including street and city if local pharmacy) is medication to be sent to?  [id number] [place] [place] - [address]     5. Do they need a 30 day or 90 day supply? 90 days   Pt only have 1 pill left

## 2024-04-20 NOTE — Telephone Encounter (Signed)
 RX sent to requested Pharmacy

## 2024-04-23 DIAGNOSIS — R569 Unspecified convulsions: Secondary | ICD-10-CM | POA: Diagnosis not present

## 2024-04-24 DIAGNOSIS — Z79899 Other long term (current) drug therapy: Secondary | ICD-10-CM | POA: Diagnosis not present

## 2024-04-24 DIAGNOSIS — Z125 Encounter for screening for malignant neoplasm of prostate: Secondary | ICD-10-CM | POA: Diagnosis not present

## 2024-04-24 DIAGNOSIS — R7303 Prediabetes: Secondary | ICD-10-CM | POA: Diagnosis not present

## 2024-04-24 DIAGNOSIS — E7800 Pure hypercholesterolemia, unspecified: Secondary | ICD-10-CM | POA: Diagnosis not present

## 2024-04-24 DIAGNOSIS — E559 Vitamin D deficiency, unspecified: Secondary | ICD-10-CM | POA: Diagnosis not present

## 2024-04-27 DIAGNOSIS — R569 Unspecified convulsions: Secondary | ICD-10-CM | POA: Diagnosis not present

## 2024-04-27 DIAGNOSIS — G4733 Obstructive sleep apnea (adult) (pediatric): Secondary | ICD-10-CM | POA: Diagnosis not present

## 2024-05-11 ENCOUNTER — Other Ambulatory Visit (HOSPITAL_BASED_OUTPATIENT_CLINIC_OR_DEPARTMENT_OTHER): Payer: Self-pay | Admitting: Cardiology

## 2024-05-11 DIAGNOSIS — G4733 Obstructive sleep apnea (adult) (pediatric): Secondary | ICD-10-CM | POA: Diagnosis not present

## 2024-05-11 DIAGNOSIS — I2510 Atherosclerotic heart disease of native coronary artery without angina pectoris: Secondary | ICD-10-CM

## 2024-05-11 DIAGNOSIS — Z951 Presence of aortocoronary bypass graft: Secondary | ICD-10-CM

## 2024-05-11 DIAGNOSIS — I252 Old myocardial infarction: Secondary | ICD-10-CM

## 2024-05-12 ENCOUNTER — Other Ambulatory Visit (HOSPITAL_BASED_OUTPATIENT_CLINIC_OR_DEPARTMENT_OTHER): Payer: Self-pay

## 2024-05-12 MED ORDER — WEGOVY 2.4 MG/0.75ML ~~LOC~~ SOAJ
2.4000 mg | SUBCUTANEOUS | 9 refills | Status: AC
  Filled 2024-05-14: qty 3, 28d supply, fill #0
  Filled 2024-06-07: qty 3, 28d supply, fill #1
  Filled 2024-07-08: qty 3, 28d supply, fill #2
  Filled 2024-08-04: qty 3, 28d supply, fill #3
  Filled 2024-09-01: qty 3, 28d supply, fill #4
  Filled 2024-09-28: qty 3, 28d supply, fill #5
  Filled 2024-10-30: qty 3, 28d supply, fill #6
  Filled 2024-12-05: qty 3, 28d supply, fill #7

## 2024-05-14 ENCOUNTER — Other Ambulatory Visit (HOSPITAL_BASED_OUTPATIENT_CLINIC_OR_DEPARTMENT_OTHER): Payer: Self-pay

## 2024-05-27 DIAGNOSIS — G4733 Obstructive sleep apnea (adult) (pediatric): Secondary | ICD-10-CM | POA: Diagnosis not present

## 2024-06-01 DIAGNOSIS — G43909 Migraine, unspecified, not intractable, without status migrainosus: Secondary | ICD-10-CM | POA: Diagnosis not present

## 2024-06-01 DIAGNOSIS — Z0000 Encounter for general adult medical examination without abnormal findings: Secondary | ICD-10-CM | POA: Diagnosis not present

## 2024-06-01 DIAGNOSIS — R251 Tremor, unspecified: Secondary | ICD-10-CM | POA: Diagnosis not present

## 2024-06-01 DIAGNOSIS — D496 Neoplasm of unspecified behavior of brain: Secondary | ICD-10-CM | POA: Diagnosis not present

## 2024-06-01 DIAGNOSIS — K219 Gastro-esophageal reflux disease without esophagitis: Secondary | ICD-10-CM | POA: Diagnosis not present

## 2024-06-01 DIAGNOSIS — R079 Chest pain, unspecified: Secondary | ICD-10-CM | POA: Diagnosis not present

## 2024-06-01 DIAGNOSIS — F324 Major depressive disorder, single episode, in partial remission: Secondary | ICD-10-CM | POA: Diagnosis not present

## 2024-06-01 DIAGNOSIS — R7303 Prediabetes: Secondary | ICD-10-CM | POA: Diagnosis not present

## 2024-06-01 DIAGNOSIS — Z6837 Body mass index (BMI) 37.0-37.9, adult: Secondary | ICD-10-CM | POA: Diagnosis not present

## 2024-06-08 ENCOUNTER — Ambulatory Visit (HOSPITAL_BASED_OUTPATIENT_CLINIC_OR_DEPARTMENT_OTHER): Admitting: Nurse Practitioner

## 2024-06-08 ENCOUNTER — Encounter (HOSPITAL_BASED_OUTPATIENT_CLINIC_OR_DEPARTMENT_OTHER): Payer: Self-pay | Admitting: Nurse Practitioner

## 2024-06-08 ENCOUNTER — Other Ambulatory Visit (HOSPITAL_BASED_OUTPATIENT_CLINIC_OR_DEPARTMENT_OTHER): Payer: Self-pay

## 2024-06-08 VITALS — BP 100/61 | HR 85 | Ht 72.0 in | Wt 272.8 lb

## 2024-06-08 DIAGNOSIS — L7682 Other postprocedural complications of skin and subcutaneous tissue: Secondary | ICD-10-CM | POA: Diagnosis not present

## 2024-06-08 DIAGNOSIS — I2510 Atherosclerotic heart disease of native coronary artery without angina pectoris: Secondary | ICD-10-CM

## 2024-06-08 DIAGNOSIS — Z951 Presence of aortocoronary bypass graft: Secondary | ICD-10-CM | POA: Diagnosis not present

## 2024-06-08 DIAGNOSIS — E785 Hyperlipidemia, unspecified: Secondary | ICD-10-CM

## 2024-06-08 DIAGNOSIS — I959 Hypotension, unspecified: Secondary | ICD-10-CM | POA: Diagnosis not present

## 2024-06-08 DIAGNOSIS — I255 Ischemic cardiomyopathy: Secondary | ICD-10-CM | POA: Diagnosis not present

## 2024-06-08 DIAGNOSIS — G250 Essential tremor: Secondary | ICD-10-CM | POA: Diagnosis not present

## 2024-06-08 MED ORDER — GABAPENTIN 100 MG PO CAPS: 100.0000 mg | ORAL_CAPSULE | Freq: Three times a day (TID) | ORAL | 3 refills | Status: DC | PRN

## 2024-06-08 NOTE — Progress Notes (Signed)
 Cardiology Office Note   Date:  06/08/2024  ID:  MATTTHEW ZIOMEK, DOB September 14, 1966, MRN [id number] PCP: Okey Carlin Redbird, MD  [facility] Providers Cardiologist:  Shelda Bruckner, MD { Click to update primary MD,subspecialty MD or APP then REFRESH:1}    PMH Coronary artery disease S/p NSTEMI S/p PCI/stent to LCx (38 mm long Synergy dES) in 2016 ICM with EF 35-45% with recovered EF 50-55% post PCI S/p CABG x1 on 11/13/2022 LIMA to LAD  ICM with recovered EF Chronic HFpEF Carotid artery disease Hypertension Hyperlipidemia GERD OSA on CPAP Resection of epidermoid brain tumor Tobacco abuse Morbid obesity  Cardiac history: Presented to ED 11/10/2022 with chest pain, relieved after taking 2 nitroglycerin  at home.  EKG showed mild ST segment depression in inferior leads and nonspecific ST/T wave changes in inferior leads.  Troponin 177 >> 4400.  He underwent cardiac catheterization which demonstrated 99% stenosis of proximal LAD, not felt to be suitable for PCI.  He underwent CABG x 1 with LIMA to LAD.  He was volume overloaded requiring diuresis, CPAP.  Discharged home with PT and OT initially recommended, however he was slow to progress and eventually return to inpatient rehab at [facility] 1/19-1/26/2024.  He was on aspirin  and Plavix  until 11/2023 at which time he was continued on aspirin  alone.  Echo 01/2024 with a EF 55 to 60%, RV normal, RAP 3, no significant valve disease.  Last cardiology clinic visit was 03/03/2024 with Dr. Bruckner.  He reported sternal pain, tender to touch, using lidocaine  with some improvement.  He also noted some lightheadedness and feeling off balance.  BP was 84/62 at a previous visit and furosemide  was stopped.  He continued to have seizures but unfortunately could not get an appointment with neurologist.  No syncope.  He had reduced cigarettes to 11/day.  Metoprolol  was discontinued given continued hypotension.  He was on Wegovy , and the importance of  hydration and nutrition was discussed.  He was down 100 pounds from hospitalization 11/2022.   History of Present Illness RAYSHAD RIVIELLO is a 58 y.o. male *** Needs hip replacement   ROS: ***  Studies Reviewed      ***  Lipoprotein (a)  Date/Time Value Ref Range Status  11/11/2022 12:46 AM <8.4 <75.0 nmol/L Final    Comment:    (NOTE) **Results verified by repeat testing** Note:  Values greater than or equal to 75.0 nmol/L may       indicate an independent risk factor for CHD,       but must be evaluated with caution when applied       to non-Caucasian populations due to the       influence of genetic factors on Lp(a) across       ethnicities. Performed At: [facility] [address] [place], [place] [id number] Jennette Shorter MD Ey:[id number]     Risk Assessment/Calculations {Does this patient have ATRIAL FIBRILLATION?:[id number]}         Physical Exam VS:  BP 100/61   Pulse 85   Ht 6' (1.829 m)   Wt 272 lb 12.8 oz (123.7 kg)   SpO2 94%   BMI 37.00 kg/m    Wt Readings from Last 3 Encounters:  06/08/24 272 lb 12.8 oz (123.7 kg)  03/03/24 289 lb 3.2 oz (131.2 kg)  12/02/23 (!) 303 lb 12.8 oz (137.8 kg)    GEN: Well nourished, well developed in no acute distress NECK: No JVD; No carotid bruits CARDIAC: ***RRR, no murmurs,  rubs, gallops RESPIRATORY:  Clear to auscultation without rales, wheezing or rhonchi  ABDOMEN: Soft, non-tender, non-distended EXTREMITIES:  No edema; No deformity   ASSESSMENT AND PLAN ***    {Are you ordering a CV Procedure (e.g. stress test, cath, DCCV, TEE, etc)?   Press F2        :[id number]}  Dispo: ***  Signed, Rosaline Bane, NP-C

## 2024-06-08 NOTE — Patient Instructions (Signed)
 Medication Instructions:   START Gabapentin  Take 1 capsule (100 mg total) by mouth 3 (three) times daily as needed for nerve pain.  *If you need a refill on your cardiac medications before your next appointment, please call your pharmacy*  Lab Work:  Your physician recommends that you return for a FASTING lipid profile, fasting after midnight. 1 week prior to your appointment in February.    If you have labs (blood work) drawn today and your tests are completely normal, you will receive your results only by: MyChart Message (if you have MyChart) OR A paper copy in the mail If you have any lab test that is abnormal or we need to change your treatment, we will call you to review the results.  Testing/Procedures:  None ordered.  Follow-Up: At [facility] you and your health needs are our priority.  As part of our continuing mission to provide you with exceptional heart care, our providers are all part of one team.  This team includes your primary Cardiologist (physician) and Advanced Practice Providers or APPs (Physician Assistants and Nurse Practitioners) who all work together to provide you with the care you need, when you need it.  Your next appointment:   6 month(s)  Provider:   Shelda Bruckner, MD, Rosaline Bane, NP, or Reche Finder, NP    We recommend signing up for the patient portal called MyChart.  Sign up information is provided on this After Visit Summary.  MyChart is used to connect with patients for Virtual Visits (Telemedicine).  Patients are able to view lab/test results, encounter notes, upcoming appointments, etc.  Non-urgent messages can be sent to your provider as well.   To learn more about what you can do with MyChart, go to [email or web address].   Other Instructions  Your physician wants you to follow-up in: 6 months.  You will receive a reminder letter in the mail two months in advance. If you don't receive a letter, please call our  office to schedule the follow-up appointment.

## 2024-06-09 ENCOUNTER — Encounter (HOSPITAL_BASED_OUTPATIENT_CLINIC_OR_DEPARTMENT_OTHER): Payer: Self-pay | Admitting: Nurse Practitioner

## 2024-06-27 DIAGNOSIS — G4733 Obstructive sleep apnea (adult) (pediatric): Secondary | ICD-10-CM | POA: Diagnosis not present

## 2024-06-29 DIAGNOSIS — G4733 Obstructive sleep apnea (adult) (pediatric): Secondary | ICD-10-CM | POA: Diagnosis not present

## 2024-07-13 DIAGNOSIS — K5900 Constipation, unspecified: Secondary | ICD-10-CM | POA: Diagnosis not present

## 2024-07-13 DIAGNOSIS — R1013 Epigastric pain: Secondary | ICD-10-CM | POA: Diagnosis not present

## 2024-07-13 DIAGNOSIS — K219 Gastro-esophageal reflux disease without esophagitis: Secondary | ICD-10-CM | POA: Diagnosis not present

## 2024-07-20 DIAGNOSIS — G894 Chronic pain syndrome: Secondary | ICD-10-CM | POA: Diagnosis not present

## 2024-07-20 DIAGNOSIS — R079 Chest pain, unspecified: Secondary | ICD-10-CM | POA: Diagnosis not present

## 2024-07-28 DIAGNOSIS — G4733 Obstructive sleep apnea (adult) (pediatric): Secondary | ICD-10-CM | POA: Diagnosis not present

## 2024-08-03 DIAGNOSIS — G43709 Chronic migraine without aura, not intractable, without status migrainosus: Secondary | ICD-10-CM | POA: Diagnosis not present

## 2024-08-03 DIAGNOSIS — I2510 Atherosclerotic heart disease of native coronary artery without angina pectoris: Secondary | ICD-10-CM | POA: Diagnosis not present

## 2024-08-03 DIAGNOSIS — D496 Neoplasm of unspecified behavior of brain: Secondary | ICD-10-CM | POA: Diagnosis not present

## 2024-08-03 DIAGNOSIS — Z79891 Long term (current) use of opiate analgesic: Secondary | ICD-10-CM | POA: Diagnosis not present

## 2024-08-03 DIAGNOSIS — G894 Chronic pain syndrome: Secondary | ICD-10-CM | POA: Diagnosis not present

## 2024-08-03 DIAGNOSIS — Z951 Presence of aortocoronary bypass graft: Secondary | ICD-10-CM | POA: Diagnosis not present

## 2024-08-03 DIAGNOSIS — Z556 Problems related to health literacy: Secondary | ICD-10-CM | POA: Diagnosis not present

## 2024-08-03 DIAGNOSIS — K5900 Constipation, unspecified: Secondary | ICD-10-CM | POA: Diagnosis not present

## 2024-08-03 DIAGNOSIS — G40909 Epilepsy, unspecified, not intractable, without status epilepticus: Secondary | ICD-10-CM | POA: Diagnosis not present

## 2024-08-31 DIAGNOSIS — G894 Chronic pain syndrome: Secondary | ICD-10-CM | POA: Diagnosis not present

## 2024-08-31 DIAGNOSIS — R079 Chest pain, unspecified: Secondary | ICD-10-CM | POA: Diagnosis not present

## 2024-09-07 ENCOUNTER — Telehealth: Payer: Self-pay | Admitting: *Deleted

## 2024-09-07 ENCOUNTER — Telehealth (HOSPITAL_BASED_OUTPATIENT_CLINIC_OR_DEPARTMENT_OTHER): Payer: Self-pay | Admitting: Cardiology

## 2024-09-07 ENCOUNTER — Telehealth (HOSPITAL_BASED_OUTPATIENT_CLINIC_OR_DEPARTMENT_OTHER): Payer: Self-pay | Admitting: *Deleted

## 2024-09-07 DIAGNOSIS — Z01818 Encounter for other preprocedural examination: Secondary | ICD-10-CM

## 2024-09-07 DIAGNOSIS — G4733 Obstructive sleep apnea (adult) (pediatric): Secondary | ICD-10-CM | POA: Diagnosis not present

## 2024-09-07 DIAGNOSIS — M1612 Unilateral primary osteoarthritis, left hip: Secondary | ICD-10-CM | POA: Diagnosis not present

## 2024-09-07 NOTE — Telephone Encounter (Signed)
   Name: James Torres  DOB: Jul 22, 1966  MRN: [id number]  Primary Cardiologist: Shelda Bruckner, MD   Preoperative team, please contact this patient and set up a phone call appointment for further preoperative risk assessment. Please obtain consent and complete medication review. Thank you for your help.  I confirm that guidance regarding antiplatelet and oral anticoagulation therapy has been completed and, if necessary, noted below.  There was not a formal request for holding aspirin , however, may hold aspirin  7 days prior to surgery, if requested by the surgeon, and resumed as soon as hemostatis has occurred at the direction of the surgeon.   I also confirmed the patient resides in the state of [place] . As per [facility] Medical Board telemedicine laws, the patient must reside in the state in which the provider is licensed.   [name], NP 09/07/2024, 3:14 PM  [facility]

## 2024-09-07 NOTE — Telephone Encounter (Signed)
 Patient televisit,med rec, and consent done.   Patient Consent for Virtual Visit         James Torres has provided verbal consent on 09/07/2024 for a virtual visit (video or telephone).   CONSENT FOR VIRTUAL VISIT FOR:  James Torres Dance  By participating in this virtual visit I agree to the following:  I hereby voluntarily request, consent and authorize [facility] and its employed or contracted physicians, physician assistants, nurse practitioners or other licensed health care professionals (the Practitioner), to provide me with telemedicine health care services (the "Services) as deemed necessary by the treating Practitioner. I acknowledge and consent to receive the Services by the Practitioner via telemedicine. I understand that the telemedicine visit will involve communicating with the Practitioner through live audiovisual communication technology and the disclosure of certain medical information by electronic transmission. I acknowledge that I have been given the opportunity to request an in-person assessment or other available alternative prior to the telemedicine visit and am voluntarily participating in the telemedicine visit.  I understand that I have the right to withhold or withdraw my consent to the use of telemedicine in the course of my care at any time, without affecting my right to future care or treatment, and that the Practitioner or I may terminate the telemedicine visit at any time. I understand that I have the right to inspect all information obtained and/or recorded in the course of the telemedicine visit and may receive copies of available information for a reasonable fee.  I understand that some of the potential risks of receiving the Services via telemedicine include:  Delay or interruption in medical evaluation due to technological equipment failure or disruption; Information transmitted may not be sufficient (e.g. poor resolution of images) to allow for  appropriate medical decision making by the Practitioner; and/or  In rare instances, security protocols could fail, causing a breach of personal health information.  Furthermore, I acknowledge that it is my responsibility to provide information about my medical history, conditions and care that is complete and accurate to the best of my ability. I acknowledge that Practitioner's advice, recommendations, and/or decision may be based on factors not within their control, such as incomplete or inaccurate data provided by me or distortions of diagnostic images or specimens that may result from electronic transmissions. I understand that the practice of medicine is not an exact science and that Practitioner makes no warranties or guarantees regarding treatment outcomes. I acknowledge that a copy of this consent can be made available to me via my patient portal [facility] MyChart), or I can request a printed copy by calling the office of [facility].    I understand that my insurance will be billed for this visit.   I have read or had this consent read to me. I understand the contents of this consent, which adequately explains the benefits and risks of the Services being provided via telemedicine.  I have been provided ample opportunity to ask questions regarding this consent and the Services and have had my questions answered to my satisfaction. I give my informed consent for the services to be provided through the use of telemedicine in my medical care

## 2024-09-07 NOTE — Telephone Encounter (Signed)
   Pre-operative Risk Assessment    Patient Name: James Torres  DOB: 1966-06-11 MRN: [id number]   Date of last office visit: 06/08/2024 Date of next office visit: None    Request for Surgical Clearance    Procedure:  Hip Replacement  Date of Surgery:  Clearance TBD                                 Surgeon:  Dr. Norleen Jama Gavel Surgeon's Group or Practice Name:  [facility] Phone number:  [phone] Fax number:  [phone]   Type of Clearance Requested:   - Medical  - Pharmacy:  Hold Aspirin  Not indicated   Type of Anesthesia:  Spinal   Additional requests/questions:    Signed, Edsel Grayce Sanders   09/07/2024, 2:46 PM

## 2024-09-07 NOTE — Telephone Encounter (Signed)
 Clearance request has been received the chart.

## 2024-09-07 NOTE — Telephone Encounter (Signed)
 Paper Work Dropped Off: Pre  Op Assessment for [facility]   Date: 09/07/2024  Location of paper:  will be faxed to onbase   If you have any concerns please call patient.

## 2024-09-07 NOTE — Telephone Encounter (Signed)
 Once we receive clearance we will put it into patient's chart

## 2024-09-07 NOTE — Telephone Encounter (Signed)
 Patient schedule for TELEVISIT, med rec, and consent done.

## 2024-09-16 ENCOUNTER — Ambulatory Visit: Attending: Student in an Organized Health Care Education/Training Program | Admitting: Emergency Medicine

## 2024-09-16 DIAGNOSIS — Z01810 Encounter for preprocedural cardiovascular examination: Secondary | ICD-10-CM | POA: Diagnosis not present

## 2024-09-16 NOTE — Addendum Note (Signed)
 Addended by: WYNETTA NIELS HERO on: 09/16/2024 05:20 PM   Modules accepted: Orders

## 2024-09-16 NOTE — Progress Notes (Signed)
 Virtual Visit via Telephone Note   Because of James Torres co-morbid illnesses, he is at least at moderate risk for complications without adequate follow up.  This format is felt to be most appropriate for this patient at this time.  Due to technical limitations with video connection (technology), today's appointment will be conducted as an audio only telehealth visit, and James Torres verbally agreed to proceed in this manner.   All issues noted in this document were discussed and addressed.  No physical exam could be performed with this format.  Evaluation Performed:  Preoperative cardiovascular risk assessment _____________   Date:  09/16/2024   Patient ID:  James Torres, DOB Jun 04, 1966, MRN [id number] Patient Location:  Home Provider location:   Office  Primary Care Provider:  Okey Carlin Redbird, MD Primary Cardiologist:  James Bruckner, MD  Chief Complaint / Patient Profile   58 y.o. y/o male with a h/o CAD s/p CABG X1 (LIMA-LAD), ICM with recovered EF, HFpEF, HTN, HLD, GERD, OSA on CPAP, brain tumor with frequent seizures, persistent neuropathic chest wall pain postsurgery managed with lidocaine , tobacco use, morbid obesity who is pending hip replacement on TBD and presents today for telephonic preoperative cardiovascular risk assessment.  History of Present Illness    James Torres is a 58 y.o. male who presents via audio/video conferencing for a telehealth visit today.  Pt was last seen in cardiology clinic on 06/08/2024 by James Bane, NP.  At that time James Torres was stable from a cardiovascular standpoint.  The patient is now pending procedure as outlined above. Since his last visit, he did begin having some dark stools which he followed up with GI and is pending an upcoming colonoscopy; however, reports the stools have since returned to normal color. He denies palpitations, dyspnea, orthopnea, n, v, dark/tarry/bloody stools, hematuria,  syncope, edema,  weight gain. He reports chronic chest wall/surgical scar pain following surgery secondary to nerve damage that is being treated/controlled with gabapentin  and lidocaine  cream, it's there constantly, not related to exertion. He also reports occasional feelings of dizziness when he takes his gabapentin  and tramadol  for his nerve pain. Reports significant hip pain since 2023. States it worsened following his CABG in 2024, and that he never recovered his stamina/functional ability secondary to limitations from hip pain. Uses a walker to get around his house, can't stand for 10 minutes at a time secondary to hip pain and has been this way for a while.  Past Medical History    Past Medical History:  Diagnosis Date   Anxiety    Brain tumor (HCC)    CAD (coronary artery disease)    a. 03/2015 NSTEMI: LM nl, LAD 50ost, 100p (2.75x38 Synergy DES), 75d, RI nl, LCX minor irregs, OM1 min irregs, RCA 50ost, EF 35-45%.   Carotid artery disease 11/2022   R - mild; L moderate   Depression    GERD (gastroesophageal reflux disease)    Hyperlipidemia    Ischemic cardiomyopathy    a. 03/2015 EF 35-45% by LV gram @ time of NSTEMI;  b. 03/2015 Echo: EF 50-55%, sev HK to DK of dist an, apical, and infap Torres.Gr 1 DD.   Kidney stone    Metabolic syndrome 03/23/2015   Migraines    Morbid obesity (HCC)    Pre-diabetes    S/P CABG x 1 11/2022   LIMA > LAD   Seizures (HCC)    a. Has auras and metalic taste in mouth.  Last  one 06/2014- last a few seconds, has one every couple weeks, Dr Geryl Forward is neurologist and is aware.    Sleep apnea 11/06/2007   a. On CPAP.   Tobacco abuse    Past Surgical History:  Procedure Laterality Date   BRAIN SURGERY  2004   Crainotomy for tumor   CARDIAC CATHETERIZATION N/A 03/21/2015   Procedure: Left Heart Cath and Coronary Angiography;  Surgeon: Ozell Fell, MD;  Location: [facility] INVASIVE CV LAB;  Service: Cardiovascular;  Laterality: N/A;   CORONARY ARTERY BYPASS GRAFT N/A  11/13/2022   Procedure: OFF PUMP CORONARY ARTERY BYPASS GRAFTING (CABG) X ONE BYPASS USING LEFT INTERNAL MAMMARY ARTERY.;  Surgeon: Shyrl Linnie KIDD, MD;  Location: [facility] OR;  Service: Open Heart Surgery;  Laterality: N/A;   KNEE ARTHROSCOPY Right 2005 approx   cartliage   LEFT HEART CATH AND CORONARY ANGIOGRAPHY N/A 11/12/2022   Procedure: LEFT HEART CATH AND CORONARY ANGIOGRAPHY;  Surgeon: Court Dorn PARAS, MD;  Location: [facility] INVASIVE CV LAB;  Service: Cardiovascular;  Laterality: N/A;   TEE WITHOUT CARDIOVERSION N/A 11/13/2022   Procedure: TRANSESOPHAGEAL ECHOCARDIOGRAM (TEE);  Surgeon: Shyrl Linnie KIDD, MD;  Location[facility] OR;  Service: Open Heart Surgery;  Laterality: N/A;   ULNAR NERVE TRANSPOSITION Left 06/25/2014   Procedure: LEFT ULNAR NEUROPLASTY AT ELBOW;  Surgeon: Alm DELENA Hummer, MD;  Location: [facility] OR;  Service: Orthopedics;  Laterality: Left;   WISDOM TOOTH EXTRACTION      Allergies  Allergies  Allergen Reactions   Zithromax [Azithromycin] Hives   Aspartame Other (See Comments)    Triggers migraines     Home Medications    Prior to Admission medications   Medication Sig Start Date End Date Taking? Authorizing Provider  acetaminophen  (TYLENOL ) 500 MG tablet Take 1,000 mg by mouth every 4 (four) hours as needed for headache (migraine).    [provider]  aspirin  EC 81 MG tablet Take 1 tablet (81 mg total) by mouth daily. 11/29/22   Love, Sharlet RAMAN, PA-C  Bismuth Subsalicylate (PEPTO-BISMOL PO) Take 1,050 mg by mouth as needed (upset stomach).    [provider]  calcium  carbonate (TUMS - DOSED IN MG ELEMENTAL CALCIUM ) 500 MG chewable tablet Chew 2 tablets by mouth as needed for indigestion or heartburn.    [provider]  citalopram  (CELEXA ) 40 MG tablet Take 40 mg by mouth every morning.    [provider]  clonazePAM  (KLONOPIN ) 0.5 MG tablet Take 0.5 mg by mouth 2 (two) times daily. 09/07/15   [provider]  diclofenac Sodium  (VOLTAREN) 1 % GEL Apply 2 g topically as needed (hip pain).    [provider]  docusate sodium  (COLACE) 100 MG capsule Take 2 capsules (200 mg total) by mouth daily. 11/29/22   Love, Sharlet RAMAN, PA-C  ezetimibe  (ZETIA ) 10 MG tablet Take 1 tablet (10 mg total) by mouth daily. 12/02/23 11/26/24  Lonni Slain, MD  famotidine  (PEPCID ) 20 MG tablet TAKE 1 TABLET BY MOUTH EVERY DAY 02/04/23   Lonni Slain, MD  gabapentin  (NEURONTIN ) 300 MG capsule Take 300 mg by mouth 3 (three) times daily.    [provider]  lamoTRIgine  (LAMICTAL ) 100 MG tablet Take 200 mg by mouth in the morning and at bedtime.    [provider]  levETIRAcetam  (KEPPRA  XR) 500 MG 24 hr tablet Take 1,000 mg by mouth 2 (two) times daily.    [provider]  Melatonin 5 MG CHEW Chew 15-20 mg by mouth at  bedtime.    [provider]  nitroGLYCERIN  (NITROSTAT ) 0.4 MG SL tablet Place 1 tablet (0.4 mg total) under the tongue every 5 (five) minutes as needed for chest pain. 04/08/23 06/08/24  Carlin Delon BROCKS, NP  oxyCODONE -acetaminophen  (PERCOCET/ROXICET) 5-325 MG tablet Take 1 tablet by mouth every 8 (eight) hours as needed for moderate pain (migraine headache). 11/29/22   Love, Sharlet RAMAN, PA-C  pantoprazole  (PROTONIX ) 40 MG tablet TAKE 2 TABLETS BY MOUTH EVERY DAY 02/11/23   Carlin Delon BROCKS, NP  perampanel  (FYCOMPA ) 8 MG tablet Take 1 tablet by mouth daily. 01/12/22   [provider]  rosuvastatin  (CRESTOR ) 40 MG tablet Take 1 tablet (40 mg total) by mouth daily. 04/20/24   Lonni Slain, MD  Semaglutide -Weight Management (WEGOVY ) 2.4 MG/0.75ML SOAJ Inject 2.4 mg into the skin once a week. 05/12/24 02/16/25  Lonni Slain, MD  simethicone  (MYLICON) 125 MG chewable tablet Chew 125 mg by mouth every 6 (six) hours as needed for flatulence.    [provider]  traMADol  (ULTRAM ) 50 MG tablet Take 50 mg by mouth. 08/10/24   [provider]    Physical  Exam    Vital Signs:  James Torres does not have vital signs available for review today.  Given telephonic nature of communication, physical exam is limited. AAOx3. NAD. Normal affect.  Speech and respirations are unlabored.  Accessory Clinical Findings    None  Assessment & Plan    1.  Preoperative Cardiovascular Risk Assessment:  According to the Revised Cardiac Risk Index (RCRI), his Perioperative Risk of Major Cardiac Event is (%): 6.6  His Functional Capacity in METs is: 3.3 according to the Duke Activity Status Index (DASI). Based on his decreased functional status over a prolonged period of time and high surgical risk for MACE, I believe this patient will require further cardiac evaluation prior to moving forward with the planned procedure. I have discussed this with the patient, who verbalizes understanding. I will order a cardiac stress test and set patient up with an in office visit to discuss results. Preoperative clearance will need to be re-evaluated pending results of the stress test.   The patient was advised that if he develops new symptoms prior to surgery to contact our office to arrange for a follow-up visit, and he verbalized understanding.  A copy of this note will be routed to requesting surgeon.  Informed Consent   Shared Decision Making/Informed Consent The risks [chest pain, shortness of breath, cardiac arrhythmias, dizziness, blood pressure fluctuations, myocardial infarction, stroke/transient ischemic attack, nausea, vomiting, allergic reaction, radiation exposure, metallic taste sensation and life-threatening complications (estimated to be 1 in 10,000)], benefits (risk stratification, diagnosing coronary artery disease, treatment guidance) and alternatives of a nuclear stress test were discussed in detail with James Torres and he agrees to proceed.     Time:   Today, I have spent 10 minutes with the patient with telehealth technology discussing medical  history, symptoms, and management plan.     Argenis Kumari E Melvyn Hommes, NP  09/16/2024, 8:40 AM

## 2024-09-16 NOTE — Telephone Encounter (Signed)
 Please see note from today's televisit.   Unfortunately, [name] will need a NM spect stress test and a follow up visit with a provider to discuss those results and provide surgical clearance for him at that time.   Please place the order for a NM Spect stress, as well as schedule a follow up appointment in office following the stress result. I have already placed the attestation.

## 2024-09-16 NOTE — Telephone Encounter (Signed)
 Per preop APP Miriam Shams, NP pt will need Lexiscan and then a f/u appt after test has been done before pt can be cleared.  Will send to schedulers to schedule lexiscan.

## 2024-09-16 NOTE — Addendum Note (Signed)
 Addended by: Laquasha Groome E on: 09/16/2024 04:57 PM   Modules accepted: Orders

## 2024-09-17 ENCOUNTER — Encounter (HOSPITAL_COMMUNITY): Payer: Self-pay | Admitting: Emergency Medicine

## 2024-09-17 NOTE — Telephone Encounter (Signed)
 Stress test set for 10/19/24. I will update the requesting office. Once the pt has been cleared the provider will fax notes to surgeon office.

## 2024-09-17 NOTE — Telephone Encounter (Signed)
 I sent a staff message to nuc dept to see if someone can call pt to set up Lexiscan for preop clearance. James Osler, RN Supervisor stated she will send this over to her admin scheduler [facility].  To reach out to the pt with an appt for lexiscan.

## 2024-09-18 ENCOUNTER — Encounter (HOSPITAL_BASED_OUTPATIENT_CLINIC_OR_DEPARTMENT_OTHER): Payer: Self-pay

## 2024-09-21 ENCOUNTER — Encounter (HOSPITAL_COMMUNITY): Payer: Self-pay | Admitting: Emergency Medicine

## 2024-09-23 ENCOUNTER — Other Ambulatory Visit (HOSPITAL_BASED_OUTPATIENT_CLINIC_OR_DEPARTMENT_OTHER): Payer: Self-pay | Admitting: Emergency Medicine

## 2024-09-23 DIAGNOSIS — Z01818 Encounter for other preprocedural examination: Secondary | ICD-10-CM

## 2024-09-24 ENCOUNTER — Telehealth (HOSPITAL_COMMUNITY): Payer: Self-pay | Admitting: *Deleted

## 2024-09-24 NOTE — Telephone Encounter (Signed)
 [name]  tc

## 2024-09-24 NOTE — Telephone Encounter (Signed)
 Patient given detailed instructions per Myocardial Perfusion Study Information Sheet for the test on 09/28/2024 at 12:45. Patient notified to arrive 15 minutes early and that it is imperative to arrive on time for appointment to keep from having the test rescheduled.  If you need to cancel or reschedule your appointment, please call the office within 24 hours of your appointment. . Patient verbalized understanding.James Torres

## 2024-09-28 ENCOUNTER — Ambulatory Visit (HOSPITAL_COMMUNITY)
Admission: RE | Admit: 2024-09-28 | Discharge: 2024-09-28 | Disposition: A | Discharge status: Home / Self Care | Source: Ambulatory Visit | Attending: Emergency Medicine | Admitting: Emergency Medicine

## 2024-09-28 DIAGNOSIS — I252 Old myocardial infarction: Secondary | ICD-10-CM | POA: Insufficient documentation

## 2024-09-28 DIAGNOSIS — M169 Osteoarthritis of hip, unspecified: Secondary | ICD-10-CM | POA: Diagnosis not present

## 2024-09-28 DIAGNOSIS — I700 Atherosclerosis of aorta: Secondary | ICD-10-CM | POA: Insufficient documentation

## 2024-09-28 DIAGNOSIS — Z951 Presence of aortocoronary bypass graft: Secondary | ICD-10-CM | POA: Insufficient documentation

## 2024-09-28 DIAGNOSIS — G40909 Epilepsy, unspecified, not intractable, without status epilepticus: Secondary | ICD-10-CM | POA: Diagnosis not present

## 2024-09-28 DIAGNOSIS — E669 Obesity, unspecified: Secondary | ICD-10-CM | POA: Diagnosis not present

## 2024-09-28 DIAGNOSIS — Z01810 Encounter for preprocedural cardiovascular examination: Secondary | ICD-10-CM | POA: Insufficient documentation

## 2024-09-28 DIAGNOSIS — Z01818 Encounter for other preprocedural examination: Secondary | ICD-10-CM | POA: Diagnosis not present

## 2024-09-28 DIAGNOSIS — I959 Hypotension, unspecified: Secondary | ICD-10-CM | POA: Diagnosis not present

## 2024-09-28 DIAGNOSIS — K8020 Calculus of gallbladder without cholecystitis without obstruction: Secondary | ICD-10-CM | POA: Insufficient documentation

## 2024-09-28 DIAGNOSIS — Z6833 Body mass index (BMI) 33.0-33.9, adult: Secondary | ICD-10-CM | POA: Diagnosis not present

## 2024-09-28 DIAGNOSIS — R7303 Prediabetes: Secondary | ICD-10-CM | POA: Diagnosis not present

## 2024-09-28 LAB — MYOCARDIAL PERFUSION IMAGING
Base ST Depression (mm): 0 mm
LV dias vol: 130 mL (ref 62–150)
LV sys vol: 60 mL (ref 4.2–5.8)
Nuc Stress EF: 62 %
Peak HR: 89 {beats}/min
Rest HR: 77 {beats}/min
Rest Nuclear Isotope Dose: 12.5 mCi
SDS: 3
SRS: 7
SSS: 10
ST Depression (mm): 0 mm
Stress Nuclear Isotope Dose: 38.5 mCi
TID: 1.01

## 2024-09-28 MED ORDER — REGADENOSON 0.4 MG/5ML IV SOLN
0.4000 mg | Freq: Once | INTRAVENOUS | Status: AC
  Administered 2024-09-28: 0.4 mg via INTRAVENOUS

## 2024-09-28 MED ORDER — TECHNETIUM TC 99M TETROFOSMIN IV KIT
38.5000 | PACK | Freq: Once | INTRAVENOUS | Status: AC | PRN
  Administered 2024-09-28: 38.5 via INTRAVENOUS

## 2024-09-28 MED ORDER — REGADENOSON 0.4 MG/5ML IV SOLN
INTRAVENOUS | Status: AC
  Filled 2024-09-28: qty 5

## 2024-09-28 MED ORDER — TECHNETIUM TC 99M TETROFOSMIN IV KIT
12.5000 | PACK | Freq: Once | INTRAVENOUS | Status: AC | PRN
  Administered 2024-09-28: 12.5 via INTRAVENOUS

## 2024-09-29 ENCOUNTER — Ambulatory Visit (HOSPITAL_BASED_OUTPATIENT_CLINIC_OR_DEPARTMENT_OTHER): Admitting: Nurse Practitioner

## 2024-09-29 ENCOUNTER — Encounter (HOSPITAL_BASED_OUTPATIENT_CLINIC_OR_DEPARTMENT_OTHER): Payer: Self-pay | Admitting: *Deleted

## 2024-09-29 ENCOUNTER — Other Ambulatory Visit (HOSPITAL_BASED_OUTPATIENT_CLINIC_OR_DEPARTMENT_OTHER): Payer: Self-pay | Admitting: *Deleted

## 2024-09-29 ENCOUNTER — Ambulatory Visit: Payer: Self-pay | Admitting: Emergency Medicine

## 2024-09-29 ENCOUNTER — Encounter (HOSPITAL_BASED_OUTPATIENT_CLINIC_OR_DEPARTMENT_OTHER): Payer: Self-pay | Admitting: Nurse Practitioner

## 2024-09-29 VITALS — BP 90/60 | HR 91 | Ht 72.0 in | Wt 247.2 lb

## 2024-09-29 DIAGNOSIS — Z951 Presence of aortocoronary bypass graft: Secondary | ICD-10-CM

## 2024-09-29 DIAGNOSIS — Z01810 Encounter for preprocedural cardiovascular examination: Secondary | ICD-10-CM

## 2024-09-29 DIAGNOSIS — I2510 Atherosclerotic heart disease of native coronary artery without angina pectoris: Secondary | ICD-10-CM | POA: Diagnosis not present

## 2024-09-29 DIAGNOSIS — L7682 Other postprocedural complications of skin and subcutaneous tissue: Secondary | ICD-10-CM

## 2024-09-29 DIAGNOSIS — I255 Ischemic cardiomyopathy: Secondary | ICD-10-CM | POA: Diagnosis not present

## 2024-09-29 DIAGNOSIS — I959 Hypotension, unspecified: Secondary | ICD-10-CM | POA: Diagnosis not present

## 2024-09-29 DIAGNOSIS — E785 Hyperlipidemia, unspecified: Secondary | ICD-10-CM | POA: Diagnosis not present

## 2024-09-29 DIAGNOSIS — E782 Mixed hyperlipidemia: Secondary | ICD-10-CM

## 2024-09-29 NOTE — Patient Instructions (Signed)
 Medication Instructions:   Your physician recommends that you continue on your current medications as directed. Please refer to the Current Medication list given to you today.   *If you need a refill on your cardiac medications before your next appointment, please call your pharmacy*  Lab Work:  None ordered.  If you have labs (blood work) drawn today and your tests are completely normal, you will receive your results only by: MyChart Message (if you have MyChart) OR A paper copy in the mail If you have any lab test that is abnormal or we need to change your treatment, we will call you to review the results.  Testing/Procedures:  None ordered.  Follow-Up: At [facility], you and your health needs are our priority.  As part of our continuing mission to provide you with exceptional heart care, our providers are all part of one team.  This team includes your primary Cardiologist (physician) and Advanced Practice Providers or APPs (Physician Assistants and Nurse Practitioners) who all work together to provide you with the care you need, when you need it.  Your next appointment:   6 month(s)  Provider:   Sheryle Donning, MD, Slater Duncan, NP, or Neomi Banks, NP    We recommend signing up for the patient portal called "MyChart".  Sign up information is provided on this After Visit Summary.  MyChart is used to connect with patients for Virtual Visits (Telemedicine).  Patients are able to view lab/test results, encounter notes, upcoming appointments, etc.  Non-urgent messages can be sent to your provider as well.   To learn more about what you can do with MyChart, go to [email or web address].   Other Instructions  Your physician wants you to follow-up in: 6 months.  You will receive a reminder letter in the mail two months in advance. If you don't receive a letter, please call our office to schedule the follow-up appointment.

## 2024-09-29 NOTE — Telephone Encounter (Signed)
 Stress testing abnormal. Will require follow up in office visit. Not cleared for surgery at this time. The team has reached out to patient to schedule appointment, awaiting response at this time.

## 2024-09-29 NOTE — Progress Notes (Unsigned)
 Cardiology Office Note   Date:  09/29/2024  ID:  KOLLIN UDELL, DOB 07/14/1966, MRN [id number] PCP: Okey Carlin Redbird, MD  [facility] Providers Cardiologist:  Shelda Bruckner, MD     [facility] Coronary artery disease S/p NSTEMI S/p PCI/stent to LCx (38 mm long Synergy dES) in 2016 ICM with EF 35-45% with recovered EF 50-55% post PCI S/p CABG x1 on 11/13/2022 LIMA to LAD  ICM with recovered EF Chronic HFpEF Carotid artery disease Hypertension Hyperlipidemia GERD OSA on CPAP Resection of epidermoid brain tumor Tobacco abuse Morbid obesity  Cardiac history: Presented to ED 11/10/2022 with chest pain, relieved after taking 2 nitroglycerin  at home.  EKG showed mild ST segment depression in inferior leads and nonspecific ST/T wave changes in inferior leads.  Troponin 177 >> 4400.  He underwent cardiac catheterization which demonstrated 99% stenosis of proximal LAD, not felt to be suitable for PCI.  He underwent CABG x 1 with LIMA to LAD.  He was volume overloaded requiring diuresis, CPAP.  Discharged home with PT and OT initially recommended, however he was slow to progress and eventually return to inpatient rehab at [facility] 1/19-1/26/2024.  He was on aspirin  and Plavix  until 11/2023 at which time he was continued on aspirin  alone.  Echo 01/2024 with a EF 55 to 60%, RV normal, RAP 3, no significant valve disease.  Seen in clinic 03/03/2024 by Dr. Bruckner. Reported sternal pain, tender to touch, using lidocaine  with some improvement. Also noted some lightheadedness and feeling off balance.  BP was 84/62 at a previous visit and furosemide  was stopped.  He continued to have seizures but unfortunately could not get an appointment with neurologist.  No syncope.  He had reduced cigarettes to 11/day.  Metoprolol  was discontinued given continued hypotension.  He was on Wegovy , and the importance of hydration and nutrition was discussed.  He was down 100 pounds from hospitalization 11/2022.    Seen by me on 06/08/24 for follow-up of CAD and accompanied by his wife. He reports continued chest pain, thought to be potential nerve damage from CABG 11/2022. Was referred to neurology. Treatment has been use of topical lidocaine  which helps some, but does not resolve discomfort. Pain extends across his upper chest to the edge of his underarms. Hard spots on his chest are noted, suspected to be scar tissue. Experiencing lightheadedness but denies chest pain like prior angina. No shortness of breath, palpitations, orthopnea, PND, edema, presyncope or syncope. History of migraines, managed with extra strength Tylenol  and occasionally Percocet, which do not alleviate his chest pain. Admits he is not very active. Hip pain limits his physical activity, affecting his ability to walk long distances or climb stairs. He uses a rolling walker for longer distances than around his home.   Had telephone assessment 09/16/24 with Kenzie Campbell, NP for preop clearance for hip replacement.   History of Present Illness Discussed the use of AI scribe software for clinical note transcription with the patient, who gave verbal consent to proceed.  History of Present Illness Jaleel Allen is a very pleasant 58 year old male who is here today for pre-operative cardiac clearance. He is accompanied by his wife. History of  myocardial infarction and coronary bypass surgery x 1 on 11/2022 with subsequent chronic chest wall pain and chronic hip pain which has limited mobility.  He was contacted by telephone for clearance for hip arthroplasty and was unable to achieve 4 METS activity.  He subsequently underwent nuclear stress test which  was intermediate risk based on abnormal wall motion, medium defect in apical to mid anterior and septal locations that appeared reversible.  He is here today for further evaluation and has had no new chest pain, dyspnea, arm pain, edema, palpitations, or presyncope.  Prior angina symptom was  bilateral arm pain.  He notes feeling lightheaded or off at times, which he relates to pain medications.  He is being managed by pain management team for chest wall pain. Has severe left hip osteoarthritis with very limited mobility and minimal exertional activity, and uses tramadol  and topical diclofenac for pain. His poor functional capacity is mainly due to hip pain rather than cardiopulmonary symptoms.  ROS: See HPI  Studies Reviewed EKG Interpretation Date/Time:  Tuesday September 29 2024 15:01:48 EST Ventricular Rate:  91 PR Interval:  150 QRS Duration:  86 QT Interval:  352 QTC Calculation: 432 R Axis:   69  Text Interpretation: Normal sinus rhythm Low voltage QRS When compared with ECG of 03-Mar-2024 15:07, Minimal criteria for Anteroseptal infarct are no longer Present Confirmed by Percy Browning [phone]) on 09/29/2024 3:12:42 PM    Lipoprotein (a)  Date/Time Value Ref Range Status  11/11/2022 12:46 AM <8.4 <75.0 nmol/L Final    Comment:    (NOTE) **Results verified by repeat testing** Note:  Values greater than or equal to 75.0 nmol/L may       indicate an independent risk factor for CHD,       but must be evaluated with caution when applied       to non-Caucasian populations due to the       influence of genetic factors on Lp(a) across       ethnicities. Performed At: [facility] [address] [place], [place] [id number] Jennette Shorter MD Ey:[phone]     Risk Assessment/Calculations           Physical Exam VS:  BP 90/60 (BP Location: Right Arm, Patient Position: Sitting, Cuff Size: Large)   Pulse 91   Ht 6' (1.829 m)   Wt 247 lb 3.2 oz (112.1 kg)   SpO2 96%   BMI 33.53 kg/m    Wt Readings from Last 3 Encounters:  09/29/24 247 lb 3.2 oz (112.1 kg)  06/08/24 272 lb 12.8 oz (123.7 kg)  03/03/24 289 lb 3.2 oz (131.2 kg)    GEN: Well nourished, well developed, appearing older than stated age in no acute distress NECK: No JVD; No carotid  bruits CARDIAC: RRR, no murmurs, rubs, gallops RESPIRATORY:  Clear to auscultation without rales, wheezing or rhonchi  ABDOMEN: Soft, non-tender, non-distended EXTREMITIES:  No edema; No deformity   Assessment & Plan CAD, prior PCI S/p CABG x 1 11/2022 Chronic post-surgical chest pain  Post-surgical neurologic pain in chest which is being treated by pain management. Abnormal stress test with some concern for fixed defect. Prior symptom of angina was bilateral arm discomfort. He has had no arm pain and denies chest pain, dyspnea, or other symptoms concerning for angina. No indication for further ischemia evaluation at this time. No bleeding concerns. No new or concerning findings on EKG today.  -Continue aspirin , rosuvastatin , ezetimibe , Wegovy  for secondary prevention of ASCVD -Recommend increased physical activity including potential water therapy -Heart healthy diet mostly centered on whole foods and limiting saturated fat, processed foods, sugar, and simple carbohydrates  Preoperative cardiovascular evaluation  Has been having significant hip pain for > 1 year which has limited his mobility. He is planning to undergo hip arthroplasty in  December. He was unable to complete > 4 METS activity so stress test was obtained for evaluation of ischemia. Stress test revealed fixed defect with concern over patency of LIMA to LAD graft. Advised by Drs. Lonni and O'Neal to evaluate symptoms and if no symptoms concerning for angina, he can proceed with surgery. Patient has limited mobility but is not having symptoms similar to previous angina which included bilateral arm pain. The patient is doing well from a cardiac perspective. Therefore, based on ACC/AHA guidelines, the patient would be at acceptable risk for the planned procedure without further cardiovascular testing. Per office protocol, he may hold aspirin  for 7 days prior to procedure and should resume as soon as hemodynamically stable  postoperatively.  - Advised him to notify us  if he develops concerning symptoms prior to surgery  - Will forward clearance to requesting provider  Ischemic cardiomyopathy Hypotension Prior ischemic CM post NSTEMI with EF 35-45%, and subsequent normalization of EF 55 to 60%, G1 DD, normal RV on echo 01/13/2024. EF normal on nuclear stress test 09/28/24. Appears euvolemic on exam. He denies dyspnea, orthopnea, PND, edema.  No presyncope or syncope. Weight is stable and he continues to lose weight on semaglutide .  Metoprolol  was stopped in the setting of fatigue.  GDMT was limited by hypotension. BP remains soft.  No indication for further evaluation.  - Continue heart healthy, low-sodium diet - Aim to increase regular physical activity  Hyperlipidemia LDL goal < 55 Lipid panel completed 04/24/24 with total cholesterol 111, HDL 42, LDL 49, triglycerides 112. Lipids are well controlled.  -Continue rosuvastatin  and ezetimibe   Obesity Sustained weight loss since starting Wegovy . No acute concerns with GI side effects.  -Continue Wegovy  - Management per PCP -Aim to start weight lifting to increase muscle mass and strength         Disposition: 6 months with Dr. Lonni or APP  Signed, Rosaline Bane, NP-C

## 2024-09-29 NOTE — Progress Notes (Signed)
 Spoke with patient regarding results. Patient states that he is not having any sx from having gallstones. Will follow up with pcp regarding this. Patient was told to come to the [facility] location to access hip surgery clearance. Was told that clearance would be sent over to ortho in order to move forward after being told that he was not clear to move forward by K. Elaine.

## 2024-09-30 ENCOUNTER — Encounter (HOSPITAL_BASED_OUTPATIENT_CLINIC_OR_DEPARTMENT_OTHER): Payer: Self-pay | Admitting: Nurse Practitioner

## 2024-10-13 ENCOUNTER — Other Ambulatory Visit: Payer: Self-pay | Admitting: Orthopedic Surgery

## 2024-10-13 DIAGNOSIS — G4733 Obstructive sleep apnea (adult) (pediatric): Secondary | ICD-10-CM | POA: Diagnosis not present

## 2024-10-15 NOTE — Care Plan (Signed)
 Ortho Bundle Case Management Note  Patient Details  Name: James Torres MRN: [id number] Date of Birth: 1965/12/30  Patient will discharge to home with family to assist. Had al needed DME. HHPT referral to [facility] and OPPT set up with [facility] [facility]. Patient and MD in agreement with plan. Choice offered.                     DME Arranged:    DME Agency:     HH Arranged:  PT HH Agency:  [facility]  Additional Comments: Please contact me with any questions of if this plan should need to change.  Charlies Pitch,  RN,BSN,MHA,CCM  [facility]opaedic Specialist  [phone] 10/15/2024, 2:28 PM

## 2024-10-19 ENCOUNTER — Encounter (HOSPITAL_COMMUNITY)

## 2024-10-19 NOTE — Progress Notes (Addendum)
 Date of COVID positive in last 90 days:  PCP - Carlin Dale Gull, MD Cardiologist - Shelda Bruckner, MD Neurology- Kenard Molt, [place]  Cardiac clearance by Rosaline Bane, NP 09/29/24 in Epic   Chest x-ray - N/A EKG - 09/29/24 Epic Stress Test - 09/28/24 Epic ECHO - 01/13/24 Epic Cardiac Cath - 11/12/22 Epic Pacemaker/ICD device last checked:N/A Spinal Cord Stimulator:N/A  Bowel Prep - N/A  Sleep Study - yes CPAP - yes every night   Fasting Blood Sugar - no longer preDM Checks Blood Sugar _____ times a day  Last dose of GLP1 agonist-  Wegovy , takes Mondays. Last dose 10/19/24 1900 GLP1 instructions:  Do not take after    Last dose of SGLT-2 inhibitors-  N/A SGLT-2 instructions:  Do not take after     Blood Thinner Instructions: N/A Last dose:   Time: Aspirin  Instructions: ASA 81, hold 7 days Last Dose: last dose 10/18/24  Activity level: Can go up a few stairs if he has a hand rail. Can perform activities of daily living without stopping and without symptoms of chest pain or shortness of breath. Ambulating with Rolator due to hip pain   Anesthesia review: CAD, cardiomyopathy, NSTEMI, OSA, CABG, seizures last yesterday - partial petite get 3-4 times a week  Patient denies shortness of breath, fever, cough and chest pain at PAT appointment  Patient verbalized understanding of instructions that were given to them at the PAT appointment. Patient was also instructed that they will need to review over the PAT instructions again at home before surgery.

## 2024-10-19 NOTE — Progress Notes (Signed)
 Please place orders for PAT appointment scheduled 10/20/24.

## 2024-10-19 NOTE — Patient Instructions (Addendum)
 SURGICAL WAITING ROOM VISITATION  Patients having surgery or a procedure may have no more than 2 support people in the waiting area - these visitors may rotate.    Children ages 24 and under will not be able to visit patients in [facility] under most circumstances.   Visitors with respiratory illnesses are discouraged from visiting and should remain at home.  If the patient needs to stay at the hospital during part of their recovery, the visitor guidelines for inpatient rooms apply. Pre-op nurse will coordinate an appropriate time for 1 support person to accompany patient in pre-op.  This support person may not rotate.    Please refer to the [facility] website for the visitor guidelines for Inpatients (after your surgery is over and you are in a regular room).    Your procedure is scheduled on: 10/26/24   Report to [facility] Main Entrance    Report to admitting at 9:30 AM   Call this number if you have problems the morning of surgery [phone]   Do not eat food :After Midnight.   After Midnight you may have the following liquids until 9:00 AM DAY OF SURGERY  Water Non-Citrus Juices (without pulp, NO RED-Apple, White grape, White cranberry) Black Coffee (NO MILK/CREAM OR CREAMERS, sugar ok)  Clear Tea (NO MILK/CREAM OR CREAMERS, sugar ok) regular and decaf                             Plain Jell-O (NO RED)                                           Fruit ices (not with fruit pulp, NO RED)                                     Popsicles (NO RED)                                                               Sports drinks like Gatorade (NO RED)               The day of surgery:  Drink ONE (1) Pre-Surgery Clear Ensure or G2 at 9:00 AM the morning of surgery. Drink in one sitting. Do not sip.  This drink was given to you during your hospital  pre-op appointment visit. Nothing else to drink after completing the  Pre-Surgery Clear Ensure or G2.          If you  have questions, please contact your surgeons office.   FOLLOW BOWEL PREP AND ANY ADDITIONAL PRE OP INSTRUCTIONS YOU RECEIVED FROM YOUR SURGEON'S OFFICE!!!     Oral Hygiene is also important to reduce your risk of infection.                                    Remember - BRUSH YOUR TEETH THE MORNING OF SURGERY WITH YOUR REGULAR TOOTHPASTE  DENTURES WILL BE REMOVED PRIOR TO SURGERY PLEASE DO  NOT APPLY Poly grip OR ADHESIVES!!!   Do NOT smoke after Midnight   Stop all vitamins and herbal supplements 7 days before surgery.   Take these medicines the morning of surgery with A SIP OF WATER: Tylenol , Citalopram , Clonazepam , Zetia , Gabapentin , Lamictal , Keppra , Tramadol    DO NOT TAKE ANY ORAL DIABETIC MEDICATIONS DAY OF YOUR SURGERY  Bring CPAP mask and tubing day of surgery.                              You may not have any metal on your body including jewelry, and body piercing             Do not wear lotions, powders, cologne, or deodorant              Men may shave face and neck.   Do not bring valuables to the hospital. [facility] IS NOT             RESPONSIBLE   FOR VALUABLES.   Contacts, glasses, dentures or bridgework may not be worn into surgery.  DO NOT BRING YOUR HOME MEDICATIONS TO THE HOSPITAL. PHARMACY WILL DISPENSE MEDICATIONS LISTED ON YOUR MEDICATION LIST TO YOU DURING YOUR ADMISSION IN THE HOSPITAL!    Patients discharged on the day of surgery will not be allowed to drive home.  Someone NEEDS to stay with you for the first 24 hours after anesthesia.   Special Instructions: Bring a copy of your healthcare power of attorney and living will documents the day of surgery if you haven't scanned them before.              Please read over the following fact sheets you were given: IF YOU HAVE QUESTIONS ABOUT YOUR PRE-OP INSTRUCTIONS PLEASE CALL [phone].   If you received a COVID test during your pre-op visit  it is requested that you wear a mask when out in  public, stay away from anyone that may not be feeling well and notify your surgeon if you develop symptoms. If you test positive for Covid or have been in contact with anyone that has tested positive in the last 10 days please notify you surgeon.      Pre-operative 4 CHG Bath Instructions  DYNA-Hex 4 Chlorhexidine  Gluconate 4% Solution Antiseptic 4 fl. oz   You can play a key role in reducing the risk of infection after surgery. Your skin needs to be as free of germs as possible. You can reduce the number of germs on your skin by washing with CHG (chlorhexidine  gluconate) soap before surgery. CHG is an antiseptic soap that kills germs and continues to kill germs even after washing.   DO NOT use if you have an allergy to chlorhexidine /CHG or antibacterial soaps. If your skin becomes reddened or irritated, stop using the CHG and notify one of our RNs at   Please shower with the CHG soap starting 4 days before surgery using the following schedule:     Please keep in mind the following:  DO NOT shave, including legs and underarms, starting the day of your first shower.   You may shave your face at any point before/day of surgery.  Place clean sheets on your bed the day you start using CHG soap. Use a clean washcloth (not used since being washed) for each shower. DO NOT sleep with pets once you start using the CHG.  CHG Shower Instructions:  If you choose  to wash your hair and private area, wash first with your normal shampoo/soap.  After you use shampoo/soap, rinse your hair and body thoroughly to remove shampoo/soap residue.  Turn the water OFF and apply about 3 tablespoons (45 ml) of CHG soap to a CLEAN washcloth.  Apply CHG soap ONLY FROM YOUR NECK DOWN TO YOUR TOES (washing for 3-5 minutes)  DO NOT use CHG soap on face, private areas, open wounds, or sores.  Pay special attention to the area where your surgery is being performed.  If you are having back surgery, having someone wash your  back for you may be helpful. Wait 2 minutes after CHG soap is applied, then you may rinse off the CHG soap.  Pat dry with a clean towel  Put on clean clothes/pajamas   If you choose to wear lotion, please use ONLY the CHG-compatible lotions on the back of this paper.     Additional instructions for the day of surgery: DO NOT APPLY any lotions, deodorants, cologne, or perfumes.   Put on clean/comfortable clothes.  Brush your teeth.  Ask your nurse before applying any prescription medications to the skin.   CHG Compatible Lotions   Aveeno Moisturizing lotion  Cetaphil Moisturizing Cream  Cetaphil Moisturizing Lotion  Clairol Herbal Essence Moisturizing Lotion, Dry Skin  Clairol Herbal Essence Moisturizing Lotion, Extra Dry Skin  Clairol Herbal Essence Moisturizing Lotion, Normal Skin  Curel Age Defying Therapeutic Moisturizing Lotion with Alpha Hydroxy  Curel Extreme Care Body Lotion  Curel Soothing Hands Moisturizing Hand Lotion  Curel Therapeutic Moisturizing Cream, Fragrance-Free  Curel Therapeutic Moisturizing Lotion, Fragrance-Free  Curel Therapeutic Moisturizing Lotion, Original Formula  Eucerin Daily Replenishing Lotion  Eucerin Dry Skin Therapy Plus Alpha Hydroxy Crme  Eucerin Dry Skin Therapy Plus Alpha Hydroxy Lotion  Eucerin Original Crme  Eucerin Original Lotion  Eucerin Plus Crme Eucerin Plus Lotion  Eucerin TriLipid Replenishing Lotion  Keri Anti-Bacterial Hand Lotion  Keri Deep Conditioning Original Lotion Dry Skin Formula Softly Scented  Keri Deep Conditioning Original Lotion, Fragrance Free Sensitive Skin Formula  Keri Lotion Fast Absorbing Fragrance Free Sensitive Skin Formula  Keri Lotion Fast Absorbing Softly Scented Dry Skin Formula  Keri Original Lotion  Keri Skin Renewal Lotion Keri Silky Smooth Lotion  Keri Silky Smooth Sensitive Skin Lotion  Nivea Body Creamy Conditioning Oil  Nivea Body Extra Enriched Lotion  Nivea Body Original Lotion  Nivea  Body Sheer Moisturizing Lotion Nivea Crme  Nivea Skin Firming Lotion  NutraDerm 30 Skin Lotion  NutraDerm Skin Lotion  NutraDerm Therapeutic Skin Cream  NutraDerm Therapeutic Skin Lotion  ProShield Protective Hand Cream  Provon moisturizing lotion  View Pre-Surgery Education Videos:  [email or web address]     Incentive Spirometer  An incentive spirometer is a tool that can help keep your lungs clear and active. This tool measures how well you are filling your lungs with each breath. Taking long deep breaths may help reverse or decrease the chance of developing breathing (pulmonary) problems (especially infection) following: A long period of time when you are unable to move or be active. BEFORE THE PROCEDURE  If the spirometer includes an indicator to show your best effort, your nurse or respiratory therapist will set it to a desired goal. If possible, sit up straight or lean slightly forward. Try not to slouch. Hold the incentive spirometer in an upright position. INSTRUCTIONS FOR USE  Sit on the edge of your bed if possible, or sit up as far as you can in bed or on  a chair. Hold the incentive spirometer in an upright position. Breathe out normally. Place the mouthpiece in your mouth and seal your lips tightly around it. Breathe in slowly and as deeply as possible, raising the piston or the ball toward the top of the column. Hold your breath for 3-5 seconds or for as long as possible. Allow the piston or ball to fall to the bottom of the column. Remove the mouthpiece from your mouth and breathe out normally. Rest for a few seconds and repeat Steps 1 through 7 at least 10 times every 1-2 hours when you are awake. Take your time and take a few normal breaths between deep breaths. The spirometer may include an indicator to show your best effort. Use the indicator as a goal to work toward during each repetition. After each set of  10 deep breaths, practice coughing to be sure your lungs are clear. If you have an incision (the cut made at the time of surgery), support your incision when coughing by placing a pillow or rolled up towels firmly against it. Once you are able to get out of bed, walk around indoors and cough well. You may stop using the incentive spirometer when instructed by your caregiver.  RISKS AND COMPLICATIONS Take your time so you do not get dizzy or light-headed. If you are in pain, you may need to take or ask for pain medication before doing incentive spirometry. It is harder to take a deep breath if you are having pain. AFTER USE Rest and breathe slowly and easily. It can be helpful to keep track of a log of your progress. Your caregiver can provide you with a simple table to help with this. If you are using the spirometer at home, follow these instructions: SEEK MEDICAL CARE IF:  You are having difficultly using the spirometer. You have trouble using the spirometer as often as instructed. Your pain medication is not giving enough relief while using the spirometer. You develop fever of 100.5 F (38.1 C) or higher. SEEK IMMEDIATE MEDICAL CARE IF:  You cough up bloody sputum that had not been present before. You develop fever of 102 F (38.9 C) or greater. You develop worsening pain at or near the incision site. MAKE SURE YOU:  Understand these instructions. Will watch your condition. Will get help right away if you are not doing well or get worse. Document Released: 03/04/2007 Document Revised: 01/14/2012 Document Reviewed: 05/05/2007 Exit[employer] Patient Information 2014 [employer], [place].   ________________________________________________________________________ WHAT IS A BLOOD TRANSFUSION? Blood Transfusion Information  A transfusion is the replacement of blood or some of its parts. Blood is made up of multiple cells which provide different functions. Red blood cells carry oxygen and are used  for blood loss replacement. White blood cells fight against infection. Platelets control bleeding. Plasma helps clot blood. Other blood products are available for specialized needs, such as hemophilia or other clotting disorders. BEFORE THE TRANSFUSION  Who gives blood for transfusions?  Healthy volunteers who are fully evaluated to make sure their blood is safe. This is blood bank blood. Transfusion therapy is the safest it has ever been in the practice of medicine. Before blood is taken from a donor, a complete history is taken to make sure that person has no history of diseases nor engages in risky social behavior (examples are intravenous drug use or sexual activity with multiple partners). The donor's travel history is screened to minimize risk of transmitting infections, such as malaria. The donated blood is  tested for signs of infectious diseases, such as HIV and hepatitis. The blood is then tested to be sure it is compatible with you in order to minimize the chance of a transfusion reaction. If you or a relative donates blood, this is often done in anticipation of surgery and is not appropriate for emergency situations. It takes many days to process the donated blood. RISKS AND COMPLICATIONS Although transfusion therapy is very safe and saves many lives, the main dangers of transfusion include:  Getting an infectious disease. Developing a transfusion reaction. This is an allergic reaction to something in the blood you were given. Every precaution is taken to prevent this. The decision to have a blood transfusion has been considered carefully by your caregiver before blood is given. Blood is not given unless the benefits outweigh the risks. AFTER THE TRANSFUSION Right after receiving a blood transfusion, you will usually feel much better and more energetic. This is especially true if your red blood cells have gotten low (anemic). The transfusion raises the level of the red blood cells which  carry oxygen, and this usually causes an energy increase. The nurse administering the transfusion will monitor you carefully for complications. HOME CARE INSTRUCTIONS  No special instructions are needed after a transfusion. You may find your energy is better. Speak with your caregiver about any limitations on activity for underlying diseases you may have. SEEK MEDICAL CARE IF:  Your condition is not improving after your transfusion. You develop redness or irritation at the intravenous (IV) site. SEEK IMMEDIATE MEDICAL CARE IF:  Any of the following symptoms occur over the next 12 hours: Shaking chills. You have a temperature by mouth above 102 F (38.9 C), not controlled by medicine. Chest, back, or muscle pain. People around you feel you are not acting correctly or are confused. Shortness of breath or difficulty breathing. Dizziness and fainting. You get a rash or develop hives. You have a decrease in urine output. Your urine turns a dark color or changes to pink, red, or brown. Any of the following symptoms occur over the next 10 days: You have a temperature by mouth above 102 F (38.9 C), not controlled by medicine. Shortness of breath. Weakness after normal activity. The white part of the eye turns yellow (jaundice). You have a decrease in the amount of urine or are urinating less often. Your urine turns a dark color or changes to pink, red, or brown. Document Released: 10/19/2000 Document Revised: 01/14/2012 Document Reviewed: 06/07/2008 [facility] Patient Information 2014 [place], [place].  _______________________________________________________________________

## 2024-10-20 ENCOUNTER — Encounter (HOSPITAL_COMMUNITY)
Admission: RE | Admit: 2024-10-20 | Discharge: 2024-10-20 | Disposition: A | Source: Ambulatory Visit | Attending: Orthopedic Surgery

## 2024-10-20 ENCOUNTER — Encounter (HOSPITAL_COMMUNITY): Payer: Self-pay

## 2024-10-20 ENCOUNTER — Other Ambulatory Visit: Payer: Self-pay

## 2024-10-20 VITALS — BP 96/63 | HR 78 | Temp 98.3°F | Resp 14 | Ht 72.0 in | Wt 247.2 lb

## 2024-10-20 DIAGNOSIS — Z01812 Encounter for preprocedural laboratory examination: Secondary | ICD-10-CM | POA: Diagnosis present

## 2024-10-20 DIAGNOSIS — K219 Gastro-esophageal reflux disease without esophagitis: Secondary | ICD-10-CM | POA: Diagnosis not present

## 2024-10-20 DIAGNOSIS — I5020 Unspecified systolic (congestive) heart failure: Secondary | ICD-10-CM | POA: Insufficient documentation

## 2024-10-20 DIAGNOSIS — Z86011 Personal history of benign neoplasm of the brain: Secondary | ICD-10-CM | POA: Insufficient documentation

## 2024-10-20 DIAGNOSIS — I252 Old myocardial infarction: Secondary | ICD-10-CM | POA: Diagnosis not present

## 2024-10-20 DIAGNOSIS — Z79899 Other long term (current) drug therapy: Secondary | ICD-10-CM | POA: Insufficient documentation

## 2024-10-20 DIAGNOSIS — G43909 Migraine, unspecified, not intractable, without status migrainosus: Secondary | ICD-10-CM | POA: Insufficient documentation

## 2024-10-20 DIAGNOSIS — Z951 Presence of aortocoronary bypass graft: Secondary | ICD-10-CM | POA: Insufficient documentation

## 2024-10-20 DIAGNOSIS — I2510 Atherosclerotic heart disease of native coronary artery without angina pectoris: Secondary | ICD-10-CM | POA: Insufficient documentation

## 2024-10-20 DIAGNOSIS — F32A Depression, unspecified: Secondary | ICD-10-CM | POA: Diagnosis not present

## 2024-10-20 DIAGNOSIS — F419 Anxiety disorder, unspecified: Secondary | ICD-10-CM | POA: Insufficient documentation

## 2024-10-20 DIAGNOSIS — Z87891 Personal history of nicotine dependence: Secondary | ICD-10-CM | POA: Diagnosis not present

## 2024-10-20 DIAGNOSIS — Z01818 Encounter for other preprocedural examination: Secondary | ICD-10-CM

## 2024-10-20 DIAGNOSIS — G4733 Obstructive sleep apnea (adult) (pediatric): Secondary | ICD-10-CM | POA: Diagnosis not present

## 2024-10-20 DIAGNOSIS — M1612 Unilateral primary osteoarthritis, left hip: Secondary | ICD-10-CM | POA: Insufficient documentation

## 2024-10-20 DIAGNOSIS — Z955 Presence of coronary angioplasty implant and graft: Secondary | ICD-10-CM | POA: Diagnosis not present

## 2024-10-20 DIAGNOSIS — G40109 Localization-related (focal) (partial) symptomatic epilepsy and epileptic syndromes with simple partial seizures, not intractable, without status epilepticus: Secondary | ICD-10-CM | POA: Diagnosis not present

## 2024-10-20 HISTORY — DX: Unspecified osteoarthritis, unspecified site: M19.90

## 2024-10-20 HISTORY — DX: Malignant (primary) neoplasm, unspecified: C80.1

## 2024-10-20 LAB — BASIC METABOLIC PANEL WITH GFR
Anion gap: 9 (ref 5–15)
BUN: 12 mg/dL (ref 6–20)
CO2: 29 mmol/L (ref 22–32)
Calcium: 9.3 mg/dL (ref 8.9–10.3)
Chloride: 102 mmol/L (ref 98–111)
Creatinine, Ser: 0.81 mg/dL (ref 0.61–1.24)
GFR, Estimated: >60 mL/min (ref >60)
Glucose, Bld: 87 mg/dL (ref 70–99)
Potassium: 4.8 mmol/L (ref 3.5–5.1)
Sodium: 140 mmol/L (ref 135–145)

## 2024-10-20 LAB — CBC
HCT: 45 % (ref 39.0–52.0)
Hemoglobin: 14.9 g/dL (ref 13.0–17.0)
MCH: 32.8 pg (ref 26.0–34.0)
MCHC: 33.1 g/dL (ref 30.0–36.0)
MCV: 99.1 fL (ref 80.0–100.0)
Platelets: 155 K/uL (ref 150–400)
RBC: 4.54 MIL/uL (ref 4.22–5.81)
RDW: 13.8 % (ref 11.5–15.5)
WBC: 5.1 K/uL (ref 4.0–10.5)
nRBC: 0 % (ref 0.0–0.2)

## 2024-10-20 LAB — SURGICAL PCR SCREEN
MRSA, PCR: NEGATIVE
Staphylococcus aureus: NEGATIVE

## 2024-10-21 NOTE — Progress Notes (Signed)
 Case: [id number] Date/Time: 10/26/24 1100   Procedure: ARTHROPLASTY, HIP, TOTAL, ANTERIOR APPROACH (Left: Hip)   Anesthesia type: Spinal   Pre-op diagnosis: LEFT HIP OSTEOARTHRITIS   Location: [facility] ROOM 08 / [facility]   Surgeons: Yvone Rush, MD       DISCUSSION: James Torres is a 58 yo male with PMH of former smoking, hx of NSTEMI and CAD s/p CABG x 1 (11/2022), CHF, carotid artery disease, OSA (on CPAP), epidermoid cyst of brain s/p resection (2004), focal epilepsy, migraines, GERD, anxiety, depression, arthritis.  Patient follows with cardiology for history of CAD s/p CABG x 1 on 11/13/2022 and CHF with recovered EF.  Patient had preop clearance visit on 09/16/2024.  He reported chronic chest wall pain after his bypass surgery.  Also reported occasional dizziness.  His functional capacity was less than 4 METS therefore stress test was ordered. Stress test on 11/24 showed findings are consistent with infarction with peri-infarct ischemia and was intermediate risk. He followed up in clinic on 11/25 to discuss results. He reported ongoing chest wall pain. Prior anginal pain was bilateral arm pain which he did not have. He reported lightheadedness related to taking pain medication. He is on GDMT but it's limited due to hypotension. Per NP Swinyer regarding clearance:   Preoperative cardiovascular evaluation  Has been having significant hip pain for > 1 year which has limited his mobility. He is planning to undergo hip arthroplasty in December. He was unable to complete > 4 METS activity so stress test was obtained for evaluation of ischemia. Stress test revealed fixed defect with concern over patency of LIMA to LAD graft. Advised by Drs. Lonni and O'Neal to evaluate symptoms and if no symptoms concerning for angina, he can proceed with surgery. Patient has limited mobility but is not having symptoms similar to previous angina which included bilateral arm pain. The patient is doing well from a cardiac  perspective. Therefore, based on ACC/AHA guidelines, the patient would be at acceptable risk for the planned procedure without further cardiovascular testing. Per office protocol, he may hold aspirin  for 7 days prior to procedure and should resume as soon as hemodynamically stable postoperatively.  - Advised him to notify us  if he develops concerning symptoms prior to surgery  - Will forward clearance to requesting provider  Patient follows with Neurology at [facility] for hx of epilepsy and migraines. He has hx of a left medial temporal epidermoid cyst of brain s/p resection 2004. It had slowly grown back and he underwent GKRS to recurrent tumor in 10/2018. Last seen in clinic on 04/23/24. He is currently taking Keppra , Fycoma, Lamotrigine . Patient reports he has seizures 3-4 x a week.  LD Wegovy : 12/15  VS: BP 96/63   Pulse 78   Temp 36.8 C (Oral)   Resp 14   Ht 6' (1.829 m)   Wt 112.1 kg   SpO2 99%   BMI 33.52 kg/m   PROVIDERS: Okey Carlin Redbird, MD   LABS: Labs reviewed: Acceptable for surgery. (all labs ordered are listed, but only abnormal results are displayed)  Labs Reviewed  SURGICAL PCR SCREEN  BASIC METABOLIC PANEL WITH GFR  CBC  TYPE AND SCREEN     IMAGES:   EKG 09/29/2024:  Normal sinus rhythm Low voltage QRS   NM stress test 09/28/24:    Findings are consistent with infarction with peri-infarct ischemia. The study is intermediate risk.   No ST deviation was noted.   LV perfusion is abnormal. There is evidence of  ischemia. There is evidence of infarction. Defect 1: There is a small defect with moderate reduction in uptake present in the apical apex location(s) that is fixed. There is abnormal wall motion in the defect area. Consistent with infarction. Defect 2: There is a medium defect with mild reduction in uptake present in the apical to mid anterior and septal location(s) that is reversible. There is abnormal wall motion in the defect area.   Left  ventricular function is abnormal. Nuclear stress EF: 62%. The left ventricular ejection fraction is normal (55-65%). End diastolic cavity size is moderately enlarged. End systolic cavity size is mildly enlarged.   CT images were obtained for attenuation correction and were examined for the presence of coronary calcium  when appropriate.   Coronary calcium  assessment not performed due to prior revascularization.  Aortic atherosclerosis was present.   Prior study not available for comparison.  Echo 01/13/2024:  IMPRESSIONS    1. Left ventricular ejection fraction, by estimation, is 55 to 60%. The left ventricle has normal function. The left ventricle has no regional wall motion abnormalities. Left ventricular diastolic parameters are consistent with Grade I diastolic dysfunction (impaired relaxation).  2. Right ventricular systolic function is normal. The right ventricular size is normal.  3. Left atrial size was mildly dilated.  4. The mitral valve is normal in structure. No evidence of mitral valve regurgitation. No evidence of mitral stenosis.  5. The aortic valve is normal in structure. Aortic valve regurgitation is not visualized. No aortic stenosis is present.  6. The inferior vena cava is normal in size with greater than 50% respiratory variability, suggesting right atrial pressure of 3 mmHg.  Comparison(s): The left ventricular function has improved. The left ventricular wall motion improved. Hx of CAD (NSTEMI s/p PCI and stenting of the circumflex with a 38 mm long Synergy DES 2016; CABG x 1 LIMA to LAD 11/13/2022).  Left heart cath 11/12/2022:    Ost LAD to Prox LAD lesion is 99% stenosed.   Mid LAD lesion is 20% stenosed.   Ost RCA to Prox RCA lesion is 50% stenosed.   Past Medical History:  Diagnosis Date   Anxiety    Arthritis    Brain tumor (HCC)    CAD (coronary artery disease)    a. 03/2015 NSTEMI: LM nl, LAD 50ost, 100p (2.75x38 Synergy DES), 75d, RI nl, LCX minor  irregs, OM1 min irregs, RCA 50ost, EF 35-45%.   Cancer (HCC)    skin to nose   Carotid artery disease 11/2022   R - mild; L moderate   Depression    GERD (gastroesophageal reflux disease)    Hyperlipidemia    Ischemic cardiomyopathy    a. 03/2015 EF 35-45% by LV gram @ time of NSTEMI;  b. 03/2015 Echo: EF 50-55%, sev HK to DK of dist an, apical, and infap walls.Gr 1 DD.   Kidney stone    Metabolic syndrome 03/23/2015   Migraines    Morbid obesity (HCC)    Pre-diabetes    no longer in range   S/P CABG x 1 11/2022   LIMA > LAD   Seizures (HCC)    a. Has auras and metalic taste in mouth.  Last one 06/2014- last a few seconds, has one every couple weeks, Dr Geryl Forward is neurologist and is aware.    Sleep apnea 11/06/2007   a. On CPAP.   Tobacco abuse     Past Surgical History:  Procedure Laterality Date   BRAIN SURGERY  11/05/2002  Crainotomy for tumor   CARDIAC CATHETERIZATION N/A 03/21/2015   Procedure: Left Heart Cath and Coronary Angiography;  Surgeon: Ozell Fell, MD;  Location: [facility] INVASIVE CV LAB;  Service: Cardiovascular;  Laterality: N/A;   CORONARY ARTERY BYPASS GRAFT N/A 11/13/2022   Procedure: OFF PUMP CORONARY ARTERY BYPASS GRAFTING (CABG) X ONE BYPASS USING LEFT INTERNAL MAMMARY ARTERY.;  Surgeon: Shyrl Linnie KIDD, MD;  Location: [facility] OR;  Service: Open Heart Surgery;  Laterality: N/A;   KNEE ARTHROSCOPY Right 2005 approx   cartliage   LEFT HEART CATH AND CORONARY ANGIOGRAPHY N/A 11/12/2022   Procedure: LEFT HEART CATH AND CORONARY ANGIOGRAPHY;  Surgeon: Court Dorn PARAS, MD;  Location: [facility] INVASIVE CV LAB;  Service: Cardiovascular;  Laterality: N/A;   MOHS SURGERY     to nose   TEE WITHOUT CARDIOVERSION N/A 11/13/2022   Procedure: TRANSESOPHAGEAL ECHOCARDIOGRAM (TEE);  Surgeon: Shyrl Linnie KIDD, MD;  Location: [facility] OR;  Service: Open Heart Surgery;  Laterality: N/A;   TOOTH EXTRACTION     7 teeth   ULNAR NERVE TRANSPOSITION Left 06/25/2014   Procedure:  LEFT ULNAR NEUROPLASTY AT ELBOW;  Surgeon: Alm DELENA Hummer, MD;  Location: [facility] OR;  Service: Orthopedics;  Laterality: Left;   WISDOM TOOTH EXTRACTION      MEDICATIONS:  acetaminophen  (TYLENOL ) 500 MG tablet   aspirin  EC 81 MG tablet   bisacodyl  (DULCOLAX) 10 MG suppository   Bismuth Subsalicylate (PEPTO-BISMOL PO)   calcium  carbonate (TUMS - DOSED IN MG ELEMENTAL CALCIUM ) 500 MG chewable tablet   citalopram  (CELEXA ) 40 MG tablet   clonazePAM  (KLONOPIN ) 0.5 MG tablet   diclofenac Sodium (VOLTAREN) 1 % GEL   docusate sodium  (COLACE) 100 MG capsule   ezetimibe  (ZETIA ) 10 MG tablet   famotidine  (PEPCID ) 20 MG tablet   gabapentin  (NEURONTIN ) 300 MG capsule   lamoTRIgine  (LAMICTAL ) 100 MG tablet   levETIRAcetam  (KEPPRA  XR) 500 MG 24 hr tablet   Melatonin Fast Dissolve 10 MG TBDP   nitroGLYCERIN  (NITROSTAT ) 0.4 MG SL tablet   oxyCODONE -acetaminophen  (PERCOCET/ROXICET) 5-325 MG tablet   pantoprazole  (PROTONIX ) 40 MG tablet   perampanel  (FYCOMPA ) 8 MG tablet   rosuvastatin  (CRESTOR ) 40 MG tablet   Semaglutide -Weight Management (WEGOVY ) 2.4 MG/0.75ML SOAJ   simethicone  (MYLICON) 125 MG chewable tablet   traMADol  (ULTRAM ) 50 MG tablet   No current facility-administered medications for this encounter.   Burnard CHRISTELLA Odis DEVONNA [facility] Short Stay/Anesthesiology [facility] Phone [phone] 10/21/2024 10:13 AM

## 2024-10-22 NOTE — Progress Notes (Signed)
 COMPREHENSIVE HEADACHE PROGRAM  FOLLOW UP         Referring Physician:    Carlin Dale Gull, MD (General)  Primary Care Physician:    Carlin Dale Gull, MD (General)    Dear Dr.   Carlin Dale Gull, MD (General):    Thank you very much for referring Rien Marland to the [facility] for further consultation of  his headaches.   Mr. Erric Machnik is a 58 y.o. y.o. male  and arrives today. Initial consult with Dr.Wells 06/12/2018.  Last visit 08/10/2021 with Dr.Wells.  Checked in at 11:05 for an 11:00 appointment.  Roomed and ready for visit at    INTERVAL HISTORY:  He lives in [place]    Had open heart surgery 11/2022- he was on Ubrelvy and MAB at the time, meds stopped when inpatient and never restarted  Was receiving botox- last visit 06/28/2022 (completed 4 rounds)-4ht round very helpful, would like to restart   He has a hip replacement pending on Monday   Bad headache yesterday and continues today Cannot keep track of the headaches as he has other pains   Current headache frequency:  Without a headache maybe one day/week Mild headaches most days  Severe headache 3-4 times/week   For headache prevention: none   He takes gabapentin  300 mg 3 times per day for nerve pain    For acute headache treatment:  He takes 3 tylenol  and after 5 hours if not better he takes another 3 He is currently taking Tylenol  AND Advil 3 times per day for hip pain    Denies change in headaches. Denies new neurologic symptoms.     He uses CPAP nightly    Quit smoking- last cigarette last week Wednesday      Continues with Dr. Reta for seizures. On Fycompa , Lamictal , keppra , and Klonopin . Last seizure was last week. He does not drive. Recent visit with Dr.ODonovan. overall much better.     Acute: Takes Advil and Tylenol  daily- hip pain   Percocet- stopped       Preventive      Gamma Knife was October 21 2018, resection 2024       Previous medications:   -Verapamil   -Keppra   -Tegratol XR - O'Donovan d/c this  -Abilify  -Atenolol  -Depakote  -Topamax  -Dilaudid    -Migranol?  -Norco  -B vitamins and magnesium  (Mag Salt)  -Cefaly  -Botox   -Acupuncture, provided some relief  ?-CoQ10/B2/Magnesium   -Emgality (on for one year- tried 3 doses of 300 mg/mo no difference)   -Ajovy   -Reyvow (minimal benefit)  -Cyproheptadine not helpful at all  -Nurtec (helpful about 50% of the time) -4 rounds botox                  Per Initial Consult Dr.Wells 8/819:   HPI: Chronic migraines since 7 th grade, worsening over time (severity from 8-10/10). 1st  neurologist performed CT which revealed an arachnoid cyst on L temporal lobe. Moved to [place], saw neurologist for years but decided to switch doctors.   In 2004, brought previous imaging for new doctor who repeated imaging and revealed diagnosis of epidermoid tumor. In 2004, had neurosurgery to remove tumor. Post-operatively, the migraines became more intense (always 10/10) and more  frequent. Could occur 15 times a month, lasting up to 2 weeks. He also developed seizures post-operatively, with started as a metallic taste in his mouth. Seizures can range in severity, from just  a taste in his mouth to losing consciousness  while driving.   PCP has been managing medications since last neurologist retired.   PMHx:   -Heart attack in 2016    Current mediations:    -Seizures: Lamictal  and Keppra  ER  -Migraines: PRN Percocet for extreme cases     Growing up, he remembers having a headache in 7th grade, 58yo, in junior high school, lunch time, going through [employer], all of sudden he couldn't see what he was looking  at.  It really scared him, went to office, was picked up from school.  It's like looking up at sun, then back down and have image spot then severe headache with vomiting, then numbness in tips of fingers and then move to lips, then to tip of tongue,   then to back of tongue.  Then migraine was over.      He had them once/twice year as a teenager.      Prior to brain surgery, his headaches were more frequent in early 90s, when moved to [facility] in 1996, started seeing a neurologist and he was getting midrin from him, but not bad (1-2/year) and midrin always helped.  He switched to Dr. Malcom.  After  the brain tumor, they slowly increased in frequency from once/mo or every 2 mo in 2004.                    He  denies any childhood periodic symptoms related to migraine (recurrent abdominal pain/vomiting, etc.).  He  denies motion sickness, ice cream brain freezes.   ??  Now, he has headaches daily--constant headache yes and no sometimes short break, but up to 2-2.5 weeks. They have been daily  8-9 yrs.  He wakes up with headaches or headache  hangover from prior day.  Sometimes he gets 2 separate migraines on the same day--first one dying down; 2nd one when aura starts again.  He has auras with every migraine--almost daily. His headaches are on either side of head--usually left sided, but  twice this mo on R side.  With his headaches, he experiences photophobia, phonophobia, nausea no vomiting. He likes to go to a quiet dark room, and sometimes the pain is so bad he can't sit still 10+.  He notices bilateral congestion with his headaches.   Sleep sometimes helps the pain (if he is able to fall asleep).  He avoids activities during the headaches.  The headaches often last hours--continuous into days.  The pain is pulsating/throbbing. On a scale of 1-10 (1-3 mild pain, 4-6 moderate pain,  7-10 severe pain), his baseline headaches are usually 7-8/10 and they reach 10+/10 and can't do anything--up to 20-23 times/mo.  Last mo only 6 days without headaches.      He denies ipsilateral autonomic features with his headaches (conjuctival injection, rhinorrhea, lacrimation, congestion, edema, ptosis, etc.)     Tinnitus most of time, especially with  migraines.     With his headaches, he denies any:  vertigo, dysarthria, weakness, diplopia, triggering of the headaches with coughing/sneezing/bowel movement, dramatic worsening of the headaches with change in position, vision loss, jaw claudication.   The headaches  do sometimes wake him up at night.    He has not had any recent fevers or infections.  He had the flu at Christmas.     He has not had any recent weight changes. He has gained weight since the surgery--over 100lbs that he attributes to not being as active.  He denies any allodynia with headaches. Rubbing his head helps    Family history of headaches:  -mom with migraines     Personal/family history of pro-thrombotic conditions (pre-eclampsia/eclampsia, repeated miscarriages, heart attack/stroke <50 yo, clotting disorder, etc):   -no    Past Treatments for his Headaches:  ?  Previous medications:   -Verapamil   -Keppra   -Tegratol XR - O'Donovan d/c this  -Abilify  -Atenolol  -Depakote  -Topamax  -Dilaudid    -Migranol?  -Norco  -B vitamins and magnesium  (Mag Salt)  -Cefaly  -Botox   -Acupuncture, provided some relief  ?-CoQ10/B2/Magnesium     Complementary treatments? (acupuncture, chiropractic manipulation, supplements, herbs)  -Acupuncture, provided some relief  -CoQ10/B2/Magnesium     Current Treatment for his Headaches:  ?-Ice or frozen gel packs  -percocet prn 1-2/week (15/mo)  ?  Past ED/Hospitalizations for his Headaches:  ?-no  ?  ?SLEEP: he  sleeps from 12-2am and wakes up around 10am, up to 8 hours/night. he has difficulty falling asleep/staying asleep.   Thoughts keep him awake at night     Snoring?  -yes, Has OSA, and has CPAP, upcoming sleep test pending, uses CPAP nightly     CAFFEINE: Daily caffeine intake: tries to stay away unless has migraine, drinks caffeine Dr. Nunzio, occasional coffee, fiancee  reports frequently--at least 5 days/week       TRIGGERS:  Triggers for his  headaches  include: MSG or preservatives, weather changes, sleep deprivation, missed meals, weather changes, bending over    Prior or Current History of Depression/Anxiety?  -depression, used to see a psychiatrist, originally for panic attacks that started in early 2000s, became depression after surgery, takes Celexa  and Klonipin, no suicidal ideations  -feels has some bad days --so tired of migraines, doesn't want to get stuck on percocet      Prior head/neck trauma?  -no    Impact on Life:  -extreme--fiancee has been doing a lot of yard work  don't do anything     Prior abuse?  -no    Home environment:   Lives in [place] with Grayce, [place]   Married:   Prior marriage from 61-to 16 young and stupid years   Work:   On disability from brain tumor, seizures, migraines; previously quarry manager  Education:   4 years bachelors degree   Children:  Son--35yo with high school girlfriend, doesn't see him   Enjoys:  fishing   Medical History[1]  Surgical History[2]  Family History[3]  Social History[4]  Allergies[5]  Current Medications[6]   Vitals:   10/22/24 1117  BP: 93/60  BP Location: Left arm  Patient Position: Sitting  Pulse: 87  Resp: 18  Temp: 97.4 F (36.3 C)  TempSrc: Temporal  SpO2: 97%  Weight: 115 kg (254 lb 6.4 oz)  Height: 1.829 m (6')       GENERAL: Pleasant,  no acute distress.     MENTAL STATUS EXAM:     - Orientation: Alert and oriented to person, place and time.   - Memory: Cooperative. Recent and remote memory normal..   - Attention, concentration: Attention span and concentration are normal.   - Language: Speech is clear and language is normal.   - Fund of knowledge: Aware of current events, vocabulary appropriate for patient age.     MOTOR:  - Full strength in all extremities    SENSATION  - Grossly intact to light touch    COORDINATION  - High frequency, low amplitude tremor of the LUE>RUE with action,  posturing, and less  so with rest during distraction. Mild intention tremor b/l with FTN, no ataxia or dysmetria    GAIT  - Routine gait is normal. No shuffling or decreased arm swing      Diagnostic Studies:       ADDENDUM  EKG from 12/02/2020 faxed to us  and received, NSR, normal EKG   Message from patient:    Hi Dr. Malvina,     As I said to you on Thursday, I went to my cardiology appointment with Dr. Victory Sharps today.     We talked about the 2 things you had mentioned. First, they did an EKG and it was faxed over to you.     Second, you told me to show him my hands shaking and to see if changing my carvedilol  dosage would help. He said I was currently on the lowest dosage and increasing it wouldn't be a problem. Currently I take the 3.125 mg pill twice a day. He is increasing  it to the 6.25 mg pill twice a day.     If you have any questions feel free to contact me.     Thanks,  Pharmacist, Community signed by: Leita Abbey, FNP 08/09/2021 2:31 PM      MRI BRAIN WITH AND WITHOUT CONTRAST, 04/27/2019 2:34 PM  INDICATION: EPIDERMOID CYST \ L72.0 Epidermoid cyst   FINDINGS:  Calvarium/skull base: No focal marrow replacing lesion suggestive of neoplasm. Postsurgical changes of left pterional craniotomy for left temporal lesion resection.  Orbits: No focal mass.  Paranasal sinuses: No air-fluid levels or substantial mucosal disease.  Brain: Postsurgical changes of left temporal lesion resection (reported epidermoid cyst). Along and involving the anterior aspect of the left temporal lobe, there is a T1 hypointense and T2 hyperintense lesion which is similar in size compared to prior,  measuring 2.2 x 2.6 cm compared to 2.1 x 2.7 cm on prior MR. As seen on prior, lesion demonstrates restricted diffusion and there is thin curvilinear enhancement along the medial and posterior aspect of the resection cavity. Susceptibility artifact is  seen along the resection cavity, corresponding with calcification seen  on prior CT. No evidence of acute infarct. No acute hemorrhage or hydrocephalus. Grossly normal flow-related signal in the major intracranial arteries and dural sinuses.  CONCLUSION:  No significant change in nodular T2 hyperintense and T1 hypointense lesion along and involving the left anterior temporal lobe, with internal restricted diffusion, which is compatible with recurrent epidermoid cyst.    MRI BRAIN WITH AND WITHOUT CONTRAST; MR ANGIOGRAPHY BRAIN WITHOUT CONTRAST, MR ANGIOGRAPHY NECK WITH AND WITHOUT CONTRAST, 06/21/2018 3:58 PM  1.  Findings consistent with interval progression of disease with increased size and bulk of residual epidermoid tumor involving the anterior left temporal lobe along the posterior margin of the resection cavity, as detailed above.    2.  Faint, somewhat nodular enhancement involving in this region is favored post-surgical or artifactual, but recommend attention on follow up  3.  Possible high-grade stenosis of the proximal left internal carotid artery. Recommend CTA for further evaluation.      CT ANGIOGRAPHY NECK, 06/30/2018 4:38 PM  1. Mild to moderate atherosclerotic calcifications of bilateral carotid bifurcations with focal stenosis of proximal left internal carotid artery of less than 50%.  2. Sequela of left temporal craniotomy for resection of epidermoid incompletely visualized and further characterized on recent MR brain.        Assessment:  Mr. Tiu is a 58 y.o.  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  His headaches are triggered by coughing/sneezing thus MRI brain with/without contrast (r/o low pressure  headache) and MRA head/neck (r/o vascular lesion)  completed and showed interval progression of tumor and possible high-grade stenosis left ICA. CTA neck showed less than 50% stenosis (patient had MI in 2016, advised to continue ASA 81 mg daily and work with PCP on cholesterol control, BP, Weight). Patient  was evaluated by Dr.Strowd and Dr.Laxton for epidermoid tumor and has had Gamma knife.    In the past he gave a report of seasonal variation, has been refractory to multitude of treatments. His headaches are unilateral with bilateral congestion, now occurring for 3-4 hours, sometimes multiple times/day,considered possible cluster headache, tried high dose verapamil , Emgality 300 mg monthly.    Concern for Medication Adjustment Disorder with daily Tylenol  and Ibuprofen use. Has pending hip replacement Monday.    He has completed 4 rounds of botox and found this helpful but was lost to F/U due to open heart surgery, will request PA. He has used Ubrelvy in the pat with benefit, will restart. Triptans contraindicated due to his cardiac history.   The patient endorses a LUE>RUE postural>action>resting tremor which has been present for some time, On exam and history, it is consistent with an essential tremor. He does not have other features concerning for Parkinsonism.      Other options to consider:   -Venlafaxine (Effexor)--but he has been told SSRIs could interfere with heart medication   -Zonisamide (zonegran)  -Namenda  -he would be a great candidate for Vypeti       Plan:  Needs F/U neurosurgery and imaging     1-Restart   Ubrogepant/ Ubrelvy  100 mg  tablets  Take 1 tablet at onset of headache and can repeat after 2 hours  Max: 200mg  in 24 hours  Safety of treating >8 migraines in a 30 day period    Packing:  16 tabs per packet  Savings card website: [place] navyflight.co.uk Patient Assistance Program:  https://hernandez-anderson.info/.pdf        Side Effects: nausea, somnolence/drowsiness and dry mouth at the higher dose    Placebo Ubrelvy 50mg   Ubrelvy 100mg    Nausea  2%   2%   4%  Somnolence  1%   2%   3%  Dry Mouth  1%  <1%   2%          Warnings and Precautions  Hypertension: New-onset or worsening of pre-existing hypertension may occur. Raynaud's phenomenon: New-onset or worsening of pre-existing hypertension may occur.        Contraindications: use with strong CYP3A4 inhibitors due to increased rug levels   (Egketocoazole, itrconazole, clarithromycin) Co-administration with VERAPAMIL  a moderate CYP3A4 inhibitor requires a dose adjustment - limit to 50 mg   Additional moderate inhibitors: cyclosporin, ciprofloxacin, fluconazole, fluvoxamine, grapefruit juice        2- Start thinking about slowly weaning off the cigarettes, we discussed a plan to wean down by one less/week   Smoking cessation    3- Request botox PA       Advised to call back directly if there are further questions, or if these symptoms  fail to improve as anticipated or worsen.   Next available 60 minute telehealth       Leita Abbey FNP-C  Headache Practitioner [facility])  Adult and Pediatric Neurology   [facility]   [place], [place]              I have spent 40 minutes involved in face-to-face and non-face-to-face activities for this patient on the day of the visit.  Professional time spent includes the following activities, in addition to those noted in the documentation: preparing to see the patient by review of history and previous tests; counseling and educating the patient; ordering medications, tests, or procedures; referring and communicating with other health care professionals; and documenting clinical information in the health record.      Previous medications:   -Verapamil    -Keppra   -Tegratol XR - O'Donovan d/c this  -Abilify  -Atenolol  -Depakote  -Topamax  -Dilaudid    -Migranol?  -Norco  -B vitamins and magnesium  (Mag Salt)  -Cefaly  -Botox   -Acupuncture, provided some relief  ?-CoQ10/B2/Magnesium   -Emgality (on for one year- tried 3 doses of 300 mg/mo no difference)   -Ajovy   -Reyvow (minimal benefit)  -Cyproheptadine not helpful at all  -Nurtec (helpful about 50% of the time) -4 rounds botox   Medications Tried ([x]  checked have been tried in the past)  Anti-seizure:  []  Acetazolamide (Diamox)  []  Carbamazepine  (Tegretol ) []  Clobazam (Onfi)  []  Gabapentin  (Neurontin )  []  Lamotragine (Lamictal ) []  Levetiracetam  (Keppra )  []  Oxcarbazepine (Trileptal)  []  Phenobarbital []  Phenytoin (Dilantin) []  Pregabalin (Lyrica)   []  Tiagabine (Gabatril) []  Primidone  []  Sodium Valproate (Depakote)       []  Topiramate (Topamax)  []  Topiramate XR (Trokendi / Qudexa)     []  Zonisamide (Zonegran)    Anti-Depressants: SSRI: []  Citalopram  (Celexa )  []  Escitalopram (Lexapro)     []  Fluvoxamine (Luvox)  []  Fluoxetine (Prozac) []  Sertraline (Zoloft)     []  Paroxetine (Paxil) SNRI: []  Desvenlafaxine (Pristiq/Khedezla) []  Duloxetine (Cymbalta) []  Venlafaxine (Effexor) []  Milnacipran (Savella) []  Levomilnacipran (Fetzima) TCA: []  Amitriptyline (Elavil)  []  Amoxapine  []  Clomipramine (Anafranil)  []  Desipramine (Norpramin) []  Doxepin (Sinequan) []  Imipramine (Tofranil) []  Maprotiline (Ludiomil)   []  Nortriptiline (Pamelor) []  Protriptyline (Vivactil) []  Trimipramine (Surmontil) MAOI: []  Phenelzine (Nardil) []  Selegiline (Emsam) []  Tranylcypromine (Parnate) []  Valbenazine (Ingrezza)  Antipsychotic: []  Aripiprazole (Abilify) []  Quetiapine (Seroquel)  []  Risperidone (Risperdal) Atypicals:   []  Bupropion (Wellbutrin) []  Buspirone (Buspar) []  Mirtazapine (Remeron) []  Nefazodone (Serzone) []  Olanzapiine and Fluoxetine (Symbyax) []   Olanzapine (Zyprexa)  []  Trazodone []  Vilazodone (Viibryd) []  Vortioxetine (Trintellix) []  Ziprasidone (Geodone)   Stimulant/Anti-Manic   []   Amphetamine and dextroamphetamine (Adderall)   []   Dextroamphetamine (Dexedrine)  []   Lithium (Lithobid) []   Methylphenidate (Ritalin) []   Methylphenidate CR (Concerta) []   Lisdexamfetamine (Vyvanse) []   Atomoxetine (Strattera) []  Dexmethylphenidate XR (Focalin )                                     []  Serdexmethylphenidate/dexmethylphenidate (Azstarys)  Anti-Hypertensives:  ACE Inhibitors:  []  Benazepril (Lotensin) []  Captopril []  Enalapril (Vasotec) []  Fosinopril []  Lisinopril  (Prinivil )     []  Moexipril  []  Perindopril (Aceon) []  Quinapril (Accupril) []  Ramipril (Altace) []  Trandolapril (Mavik) Angiotensin II Receptor Blockers: []  Azilsartan Sharen) []  Candesartan (Atacand) []  Eprosartan []  Irbesartan (Avapro) []  Losartan (Cozaar) []  Olmesartan (Benicar) []  Telmisartan (Misardis) []   Valsartan (Diovan) Beta Blockers []  Acebutolol (Sectral) []  Atenolol (Tenormin) []  Bisoprolol (Zebeta) []  Carvedilol  (Coreg ) []  Metoprolol  (Lopressor ) []  Nadolol (Cogard) []  Nebivolol (Bystolic) []  Propranolol (Inderal)    []  Timolol  Calcium  Channel Blockers:  []  Amlodipine  (Norvasc) []  Bepridil (Vascor) []  Diltiazem (Cardiazem) []  Felodipine (Plendil) []  Nicardipine (Cardene)  []  Nifedipine (Procardia) []  Nisoldipine (Sular) []  Verapamil   Alpha-1 Blockers []  Doxazosin (Cardura) []  Clonidine (Catapres)   []  Prazosin []  Tetrazosin Diuretics:  []  Furosemide  (Lasix ) []  Hydrochlorothiazide (Microzide) []  Spironolactone (Aldactone)  Monoclonal Antibodies:  []  Erenumab (Aimovig) []  Fgremanezumab (Ajovy) []  Galcanezumab (Emgality) []  Eptinezumab (Vyepti)  Gepants - for prevention []  Nurtec (Remigepant)  []  Atogepant (Quilipta)  Other Headache Management:    []  Doxycycline   []  Memantine (Namenda) []  OnabotulinumtoxinA  (Botox)    Ergotamines:  []  Dihydroergotamine nasal spray (Migranal)   []  Dihydroergotamine solution for injection (DHE-45)  []  Dihydroergotamine nasal powder (Trudhesa)    []  Ergotamine/caffeine tab (Cafergot) []  Ergotamine/caffeine suppository (Migergot)  []  Methergine  []  Methylsergide (Sansert)  Triptans:  []  Sumatriptan (Imitrex) PO  []  Sumatriptan (Imitrex) NS []  Sumatriptan (Imitrex) SQ injection  []  Sumatriptan (Onzetra) Nasal powder []  Sumatriptan/Naproxen (treximet)  []  Eletriptan (Relpax)     []  Zolmitriptan (Zomig) nasal spray []  Zolmitriptan (Zomig) PO    []  Rizatriptan (Maxalt)    []  Almotriptan (Axert)    []  Naratriptan (Amerge)     []  Frovatriptan (Frova)   Ditan:  []  Lasmiditan (Reyvow)  2nd Generation gPANTS: []  Ubrelvy (Ubrogepant) []  Nurtec (Remigepant) []  Zavegepant (Zavzpret)    Supplements:       []  Butterbur    []  Coenzyme Q10  []  Feverfew   []  Magnesium  []  Migrelief (combination Feverfew/Magnesium /Riboflavin) []  Melatonin []  Vit. B2 (riboflavin)   []  Tumeric                                                               NSAIDS:    []  Aspirin  []  Celecoxib (Celebrex) []  Diclofenac potassium []  Diclofenac sodium  []  Ibuprofen (Advil) []  Indomethacin []  Ketoprofen  []  Ketorolac PO (Toradol) []  Ketorolac IM (Toradol)  []  Ketorolac NS (Sprix) []  Meloxicam (Mobic) []  Nabumetone (relafen)    []  Naproxen sodium (Aleve)                               []  Acetaminophen  (tylenol )  Combination Analgesics:  []  Acetaminophen /aspirin /caffeine (Excedrin/Pamprin/Goodys Powder) []  Acetaminophen /caffeine/pyrilamine maleate (Midol) []  Acetaminophen /dichloralphenazone/isometheptene (Midrin) []  Asprin and Caffeine (BC Powder) []  Butalbital/aspirin /caffeine/codeine (Fiorinal with codeine) []  Butalbital/Aspirin /Caffeine (Fiorinal) []  Butalbital/acetaminophen /caffeine (Fioricet)   Anti-Histamines:  []  Cetirizine (Zyrtec)  []  Cyproheptadine  (Periactin)   []  Diphenhydramine (Benadryl)  []  Fexofenadine (allegra) []  Hydroxyzine (vistaril/atarax)  []  Loratadine (Claritin) []  Montelukast (Singulair) []  Pseudoephedrine (Sudafed)  Anti-emetics:  []  Aprepitant (Emend) []  Granisetron  []  Metoclopramide  (Reglan )  []  Ondansetron  (Zofran ) []  Meclizine (Bonine) []  Prochlorperazine  (compazine ) []  Promethazine  (Phenergan )   []  Chlorpromazine (thorazine)  Muscle relaxers:  []  Baclofen (lioresal) []  Carisoprodol (Soma) []  Cyclobenzaprine (flexeril) []  Metaxalone (skelaxin) []  Methocarbamol (robaxin) []  Orphenadrine (Norflex) []  Tizanidine (zanaflex)  Steroids: []  Dexamethasone (decadron) []  Prednisone  []  Methylprednisolone    Sleep Aids:  []  Eszopiclone (Lunesta) []  Ramelteon (Rozerem []  Zaleplon (Sonata) []  Zolpidem (Ambien) []  Nyquil []  Tylenol   PM   Benzodiazepines:     []  Alprazolam  (Xanax )   []  Chlordiazepoxide (Librium)  []  Clonazepam  (Klonopin ) []  Diazepam (Valium) []  Lorazepam (Ativan)   []  Temazepam  (Restoril )  Opioids/Narcotics/Controlled Substances:  []  Acetaminophen /Hydrocodone  (Norco/Vicodin) []  Butorphanol (Ketamine/Stadol)  []  Fentanyl         []  Hydrocodone   []  Hydromorphone  (Dilaudid ) []  Marijuana []  Morphine  (MS Contin ) []  Oxycodone  []  Oxycodone /Acetaminophen  (Percocet) []  Tramadol  (Ultram )  Procedures:                                                                    []  Auriculotemporal blocks        []  Lumbar puncture  []  Occipital nerve blocks []  Sphenopalatine ganglion blocks []  Supraorbital blocks                              []  Trigger point injections  Neuromodulation:  []  Cefaly  []  nVNS/Gammacore  []  Nerivio Migra []  Spring TMS  []  Relivion   Non-pharmacologic Tx  []  Acupuncture  []  Acupressure   []  Biofeedback  []  Craniosacral therapy  []  Chiropractic Care  []  Cognitive Behavioral Therapy []  Daith piercing  []  Dry needling   []  Massage therapy  []  Physical  therapy []  Psychotherapy  []  TENS unit  []  Yoga []  Reiki []  Tai Chi        [1] Past Medical History: Diagnosis Date   Anxiety    Brain tumor    (CMD) 09/2003   CAD (coronary artery disease)    Depression    Epidermoid cyst 07/29/2018   brain   GERD (gastroesophageal reflux disease)    History of heart attack 03/21/2015   Migraines    S/P Botox injection    for migraines   Seizures    (CMD)   [2] Past Surgical History: Procedure Laterality Date   BRAIN TUMOR EXCISION     Procedure: BRAIN TUMOR EXCISION   KNEE SURGERY Right    Procedure: KNEE SURGERY  [3] Family History Problem Relation Name Age of Onset   Migraines Mother     Heart failure Father    [4] Social History Socioeconomic History   Marital status: Single  Tobacco Use   Smoking status: Every Day    Current packs/day: 1.00    Types: Cigarettes   Smokeless tobacco: Never  Substance and Sexual Activity   Alcohol use: No   Drug use: No   Social Drivers of Health   Living Situation: Low Risk (07/18/2024)   Received from [facility] Situation    In the last 12 months, was there a time when you were not able to pay the mortgage or rent on time?: No    In the past 12 months, how many times have you moved where you were living?: 0    At any time in the past 12 months, were you homeless or living in a shelter (including now)?: No  Food Insecurity: No Food Insecurity (09/29/2024)   Received from [facility]   Food vital sign    Within the past 12 months, you worried that your food would run out before you got the money to buy more.: Never true    Within the  past 12 months, the food you bought just didn't last and you didn't have money to get more.: Never true  Transportation Needs: No Transportation Needs (07/18/2024)   Received from [facility] - Transportation    In the past 12 months, has lack of transportation kept you from medical appointments or from  getting medications?: No    In the past 12 months, has lack of transportation kept you from meetings, work, or from getting things needed for daily living?: No  Utilities: Not At Risk (07/18/2024)   Received from [facility]   Utilities    In the past 12 months has the electric, gas, oil, or water company threatened to shut off services in your home?: No  Safety: Not At Risk (07/18/2024)   Received from [facility]   HITS    Over the last 12 months how often did your partner physically hurt you?: Never    Over the last 12 months how often did your partner insult you or talk down to you?: Never    Over the last 12 months how often did your partner threaten you with physical harm?: Never    Over the last 12 months how often did your partner scream or curse at you?: Never  Alcohol Screening: Not At Risk (07/18/2024)   Received from [facility]   AUDIT-C    Q1: How often do you have a drink containing alcohol?: Never  Tobacco Use: Medium Risk (10/20/2024)   Received from [facility]   Patient History    Smoking Tobacco Use: Former    Smokeless Tobacco Use: Never    Passive Exposure: Current  Depression: Not At Risk (04/23/2024)   PHQ-2    PHQ-2 Score: 0  Social Connections: Moderately Integrated (07/18/2024)   Received from [facility]   Social Network    How would you rate your social network (family, work, friends)?: Adequate participation with social networks  Financial Resource Strain: Low Risk (07/18/2024)   Received from [facility]   Overall Financial Resource Strain (CARDIA)    How hard is it for you to pay for the very basics like food, housing, medical care, and heating?: Not hard at all  [5] Allergies Allergen Reactions   Azithromycin Hives   Aspartame Other (See Comments)    Triggers migraines   Prednisone Other (See Comments)  [6] Current Outpatient Medications  Medication Sig Dispense Refill   acetaminophen  (TYLENOL ) 500 mg tablet       acetaminophen  (TYLENOL ) 500 mg tablet Take 1,000 mg by mouth.     aspirin  81 mg EC tablet Take 81 mg by mouth Once Daily.     BisaCODYL  (Dulcolax, bisacodyl ,) 5 mg EC tablet      bismuth subsalicylate (Pepto-BismoL) 262 mg/15 mL suspension 30 mL.     calcium  carbonate (TUMS) 500 mg (200 mg calcium ) chewable tablet Chew 2-3 tablets as needed for indigestion or heartburn.     citalopram  (CeleXA ) 40 mg tablet Take 40 mg by mouth Once Daily.     clonazePAM  (KlonoPIN ) 0.5 mg tablet Take 0.5 mg by mouth 2 (two) times a day as needed for anxiety.     diclofenac sodium (Voltaren Arthritis Pain) 1 % gel      ezetimibe  (ZETIA ) 10 mg tablet Take 10 mg by mouth daily.     famotidine  (PEPCID ) 20 mg tablet Take 20 mg by mouth daily as needed.     lamoTRIgine  (LaMICtal ) 100 mg tablet Take 2 tablets (200 mg  total) by mouth 2 (two) times a day. 360 tablet 3   levETIRAcetam  (Keppra  XR) 500 mg Tb24 2 tablets in the morning and two tablet at night Orally daily     melatonin 10 mg chew      melatonin 10 mg tablet 10 mg nightly.     melatonin 5 mg chew      metoprolol  tartrate (LOPRESSOR ) 25 mg tablet take 1/2 tablet by mouth 2 times daily.     nitroglycerin  (NITROSTAT ) 0.4 mg SL tablet Place 0.4 mg under the tongue every 5 (five) minutes as needed for chest pain.     oxyCODONE -acetaminophen  (PERCOCET) 5-325 mg per tablet Take 1 tablet by mouth every 4 (four) hours as needed.     oxyCODONE -acetaminophen  (PERCOCET) 5-325 mg per tablet 1 tablet.     pantoprazole  (PROTONIX ) 40 mg EC tablet Take 80 mg by mouth Once Daily.     perampaneL  (Fycompa ) 8 mg tab tablet Take 1 tablet (8 mg total) by mouth nightly. 30 tablet 5   rosuvastatin  (CRESTOR ) 40 mg tablet Take 40 mg by mouth Once Daily.     semaglutide , weight loss, (Wegovy ) 2.4 mg/0.75 mL subcutaneous pen injector Inject 2.4 mg under the skin every 7 days.     sennosides-docusate sodium  (Colace 2-In-1) 8.6-50 mg per tablet Take 1 tablet by mouth  daily. (Patient taking differently: Take 1 tablet by mouth daily. 1tablet in the morning and 1 tablet in the evening)     simethicone  (Gas-X Extra Strength) 125 mg chewable tablet      ubrogepant (UBRELVY) 100 mg tablet Take 100mg  at onset of headache, repeat 2hr later if headache persists. Do not exceed 200mg  in 24hr 16 tablet 6   Wegovy  2.4 mg/0.75 mL subcutaneous pen injector      levETIRAcetam  (KEPPRA  XR) 500 mg Tb24 Take 2 tablets (1,000 mg total) by mouth daily AND 3 tablets (1,500 mg total) nightly. (Patient not taking: No sig reported) 450 tablet 3   No current facility-administered medications for this visit.

## 2024-10-23 NOTE — Anesthesia Preprocedure Evaluation (Signed)
"                                    Anesthesia Evaluation    Airway        Dental   Pulmonary Patient abstained from smoking., former smoker          Cardiovascular      Neuro/Psych    GI/Hepatic   Endo/Other    Renal/GU      Musculoskeletal   Abdominal   Peds  Hematology   Anesthesia Other Findings   Reproductive/Obstetrics                              Anesthesia Physical Anesthesia Plan  ASA:   Anesthesia Plan:    Post-op Pain Management:    Induction:   PONV Risk Score and Plan:   Airway Management Planned:   Additional Equipment:   Intra-op Plan:   Post-operative Plan:   Informed Consent:   Plan Discussed with:   Anesthesia Plan Comments: (See PAT note from 12/16. Pt has abnormal stress test - Dr. Tilford discussed with Dr. Lonni that patient is ok to proceed )         Anesthesia Quick Evaluation  "

## 2024-10-25 NOTE — H&P (Signed)
 TOTAL HIP ADMISSION H&P  Patient is admitted for left total hip arthroplasty.  Subjective:  Chief Complaint: left hip pain  HPI: James Torres, 58 y.o. male, has a history of pain and functional disability in the left hip(s) due to arthritis and patient has failed non-surgical conservative treatments for greater than 12 weeks to include NSAID's and/or analgesics, flexibility and strengthening excercises, supervised PT with diminished ADL's post treatment, weight reduction as appropriate, and activity modification.  Onset of symptoms was gradual starting several years ago with gradually worsening course since that time.The patient noted no past surgery on the left hip(s).  Patient currently rates pain in the left hip at 10 out of 10 with activity. Patient has night pain, worsening of pain with activity and weight bearing, trendelenberg gait, pain that interfers with activities of daily living, and pain with passive range of motion. Patient has evidence of subchondral cysts and joint space narrowing by imaging studies. This condition presents safety issues increasing the risk of falls.   There is no current active infection.  Patient Active Problem List   Diagnosis Date Noted   Arthritis of left hip 10/25/2024   Elevated LFTs 11/29/2022   Debility 11/23/2022   OSA on CPAP 11/15/2022   Thrombocytopenia 11/15/2022   S/P CABG x 1 11/15/2022   Acute hypoxic respiratory failure (HCC) 11/14/2022   Acute respiratory failure (HCC) 11/13/2022   Hx of CABG 11/13/2022   NSTEMI (non-ST elevated myocardial infarction) (HCC) 11/10/2022   Migraines 04/18/2017   Seizures (HCC) 09/07/2015   HLD (hyperlipidemia) 03/28/2015   Metabolic syndrome 03/23/2015   Old MI (myocardial infarction) 03/22/2015   Hyperglycemia 03/22/2015   Tobacco abuse 03/22/2015   CAD (coronary artery disease) 03/22/2015   Cardiomyopathy, ischemic 03/22/2015   Chronic diastolic heart failure (HCC)    Chest pain 03/21/2015   Past  Medical History:  Diagnosis Date   Anxiety    Arthritis    Brain tumor (HCC)    CAD (coronary artery disease)    a. 03/2015 NSTEMI: LM nl, LAD 50ost, 100p (2.75x38 Synergy DES), 75d, RI nl, LCX minor irregs, OM1 min irregs, RCA 50ost, EF 35-45%.   Cancer (HCC)    skin to nose   Carotid artery disease 11/2022   R - mild; L moderate   Depression    GERD (gastroesophageal reflux disease)    Hyperlipidemia    Ischemic cardiomyopathy    a. 03/2015 EF 35-45% by LV gram @ time of NSTEMI;  b. 03/2015 Echo: EF 50-55%, sev HK to DK of dist an, apical, and infap walls.Gr 1 DD.   Kidney stone    Metabolic syndrome 03/23/2015   Migraines    Morbid obesity (HCC)    Pre-diabetes    no longer in range   S/P CABG x 1 11/2022   LIMA > LAD   Seizures (HCC)    a. Has auras and metalic taste in mouth.  Last one 06/2014- last a few seconds, has one every couple weeks, Dr Geryl Forward is neurologist and is aware.    Sleep apnea 11/06/2007   a. On CPAP.   Tobacco abuse     Past Surgical History:  Procedure Laterality Date   BRAIN SURGERY  11/05/2002   Crainotomy for tumor   CARDIAC CATHETERIZATION N/A 03/21/2015   Procedure: Left Heart Cath and Coronary Angiography;  Surgeon: Ozell Fell, MD;  Location: [facility] INVASIVE CV LAB;  Service: Cardiovascular;  Laterality: N/A;   CORONARY ARTERY BYPASS GRAFT N/A 11/13/2022  Procedure: OFF PUMP CORONARY ARTERY BYPASS GRAFTING (CABG) X ONE BYPASS USING LEFT INTERNAL MAMMARY ARTERY.;  Surgeon: Shyrl Linnie KIDD, MD;  Location: [facility] OR;  Service: Open Heart Surgery;  Laterality: N/A;   KNEE ARTHROSCOPY Right 2005 approx   cartliage   LEFT HEART CATH AND CORONARY ANGIOGRAPHY N/A 11/12/2022   Procedure: LEFT HEART CATH AND CORONARY ANGIOGRAPHY;  Surgeon: Court Dorn PARAS, MD;  Location: [facility] INVASIVE CV LAB;  Service: Cardiovascular;  Laterality: N/A;   MOHS SURGERY     to nose   TEE WITHOUT CARDIOVERSION N/A 11/13/2022   Procedure: TRANSESOPHAGEAL ECHOCARDIOGRAM  (TEE);  Surgeon: Shyrl Linnie KIDD, MD;  Location: [facility] OR;  Service: Open Heart Surgery;  Laterality: N/A;   TOOTH EXTRACTION     7 teeth   ULNAR NERVE TRANSPOSITION Left 06/25/2014   Procedure: LEFT ULNAR NEUROPLASTY AT ELBOW;  Surgeon: Alm DELENA Hummer, MD;  Location: [facility] OR;  Service: Orthopedics;  Laterality: Left;   WISDOM TOOTH EXTRACTION      No current facility-administered medications for this encounter.   Current Outpatient Medications  Medication Sig Dispense Refill Last Dose/Taking   acetaminophen  (TYLENOL ) 500 MG tablet Take 1,000 mg by mouth every 4 (four) hours as needed for headache (migraine).   Taking As Needed   aspirin  EC 81 MG tablet Take 1 tablet (81 mg total) by mouth daily. 30 tablet 12 Taking   bisacodyl  (DULCOLAX) 10 MG suppository Place 10 mg rectally daily as needed (constipation).   Taking As Needed   Bismuth Subsalicylate (PEPTO-BISMOL PO) Take 1,050 mg by mouth as needed (upset stomach).   Taking As Needed   calcium  carbonate (TUMS - DOSED IN MG ELEMENTAL CALCIUM ) 500 MG chewable tablet Chew 2-3 tablets by mouth as needed for indigestion or heartburn.   Taking As Needed   citalopram  (CELEXA ) 40 MG tablet Take 40 mg by mouth every morning.   Taking   clonazePAM  (KLONOPIN ) 0.5 MG tablet Take 0.5 mg by mouth 2 (two) times daily.   Taking   diclofenac Sodium (VOLTAREN) 1 % GEL Apply 4 g topically as needed (hip pain).   Taking As Needed   docusate sodium  (COLACE) 100 MG capsule Take 2 capsules (200 mg total) by mouth daily. (Patient taking differently: Take 100 mg by mouth 2 (two) times daily.) 60 capsule 0 Taking Differently   ezetimibe  (ZETIA ) 10 MG tablet Take 1 tablet (10 mg total) by mouth daily. 90 tablet 3 Taking   famotidine  (PEPCID ) 20 MG tablet TAKE 1 TABLET BY MOUTH EVERY DAY (Patient taking differently: Take 20 mg by mouth every evening.) 90 tablet 2 Taking Differently   gabapentin  (NEURONTIN ) 300 MG capsule Take 300 mg by mouth 3 (three) times daily.    Taking   lamoTRIgine  (LAMICTAL ) 100 MG tablet Take 200 mg by mouth in the morning and at bedtime.   Taking   levETIRAcetam  (KEPPRA  XR) 500 MG 24 hr tablet Take 1,000 mg by mouth 2 (two) times daily.   Taking   Melatonin Fast Dissolve 10 MG TBDP Chew 10 mg by mouth at bedtime   Taking   pantoprazole  (PROTONIX ) 40 MG tablet TAKE 2 TABLETS BY MOUTH EVERY DAY 180 tablet 3 Taking   perampanel  (FYCOMPA ) 8 MG tablet Take 1 tablet by mouth every evening.   Taking   rosuvastatin  (CRESTOR ) 40 MG tablet Take 1 tablet (40 mg total) by mouth daily. (Patient taking differently: Take 40 mg by mouth every evening.) 90 tablet 3 Taking Differently   Semaglutide -Weight  Management (WEGOVY ) 2.4 MG/0.75ML SOAJ Inject 2.4 mg into the skin once a week. 3 mL 9 Taking   simethicone  (MYLICON) 125 MG chewable tablet Chew 125-250 mg by mouth every 6 (six) hours as needed for flatulence.   Taking As Needed   traMADol  (ULTRAM ) 50 MG tablet Take 50 mg by mouth in the morning, at noon, and at bedtime.   Taking   nitroGLYCERIN  (NITROSTAT ) 0.4 MG SL tablet Place 1 tablet (0.4 mg total) under the tongue every 5 (five) minutes as needed for chest pain. 25 tablet 7    oxyCODONE -acetaminophen  (PERCOCET/ROXICET) 5-325 MG tablet Take 1 tablet by mouth every 8 (eight) hours as needed for moderate pain (migraine headache). (Patient not taking: Reported on 10/15/2024) 21 tablet 0 Not Taking   Allergies[1]  Social History   Tobacco Use   Smoking status: Former    Current packs/day: 0.00    Average packs/day: 0.5 packs/day for 20.0 years (10.0 ttl pk-yrs)    Types: Cigarettes    Quit date: 10/14/2024    Years since quitting: 0.0    Passive exposure: Current   Smokeless tobacco: Never   Tobacco comments:    08/16/2023 Patient smokes 3/4 pack a day  Substance Use Topics   Alcohol use: No    Alcohol/week: 0.0 standard drinks of alcohol    Family History  Problem Relation Age of Onset   Healthy Mother    Heart disease Father     Stroke Father    Healthy Sister    Healthy Sister    Heart attack Neg Hx      Review of Systems  Constitutional:  Positive for fatigue.  HENT: Negative.    Eyes: Negative.   Respiratory: Negative.    Cardiovascular:        Hx of MI  Gastrointestinal:  Positive for diarrhea.  Endocrine: Negative.   Musculoskeletal:  Positive for arthralgias.  Allergic/Immunologic: Negative.   Neurological:  Positive for seizures and headaches.  Hematological: Negative.   Psychiatric/Behavioral:         Depression    Objective:  Physical Exam Constitutional:      Appearance: Normal appearance.  HENT:     Head: Normocephalic and atraumatic.     Nose: Nose normal.  Eyes:     Pupils: Pupils are equal, round, and reactive to light.  Cardiovascular:     Pulses: Normal pulses.  Pulmonary:     Effort: Pulmonary effort is normal.  Musculoskeletal:        General: Tenderness present.     Cervical back: Normal range of motion and neck supple.     Comments: He has pain with internal/external rotation of the left hip. Negative straight leg raise  Skin:    General: Skin is warm and dry.  Neurological:     General: No focal deficit present.     Mental Status: He is alert and oriented to person, place, and time. Mental status is at baseline.  Psychiatric:        Mood and Affect: Mood normal.        Behavior: Behavior normal.        Thought Content: Thought content normal.        Judgment: Judgment normal.     Vital signs in last 24 hours:    Labs:   Estimated body mass index is 33.52 kg/m as calculated from the following:   Height as of 10/20/24: 6' (1.829 m).   Weight as of 10/20/24: 112.1 kg.  Imaging Review Plain radiographs demonstrate AP of the pelvis and crosstable lateral of the left hip were taken and reviewed in the office today. This shows bone-on-bone arthritis superior weightbearing surface with periarticular osteophyte formation and subchondral  sclerosis.   Assessment/Plan:  End stage arthritis, left hip(s)  The patient history, physical examination, clinical judgement of the provider and imaging studies are consistent with end stage degenerative joint disease of the left hip(s) and total hip arthroplasty is deemed medically necessary. The treatment options including medical management, injection therapy, arthroscopy and arthroplasty were discussed at length. The risks and benefits of total hip arthroplasty were presented and reviewed. The risks due to aseptic loosening, infection, stiffness, dislocation/subluxation,  thromboembolic complications and other imponderables were discussed.  The patient acknowledged the explanation, agreed to proceed with the plan and consent was signed. Patient is being admitted for inpatient treatment for surgery, pain control, PT, OT, prophylactic antibiotics, VTE prophylaxis, progressive ambulation and ADL's and discharge planning.The patient is planning to be discharged home with home health services   We will plan on moving forward with a left anterior THA by Dr. Yvone on 10/26/2024. Hopefully we can do this as an outpatient. He is surgery is scheduled for late morning. If not we will keep him overnight. Will use Eliquis  2.5 mg twice daily x 3 weeks postop for DVT prophylaxis. I discussed the surgery with the patient and his wife and all questions were answered today.    [1]  Allergies Allergen Reactions   Zithromax [Azithromycin] Hives   Prednisone Other (See Comments)   Aspartame Other (See Comments)    Triggers migraines

## 2024-10-26 ENCOUNTER — Ambulatory Visit (HOSPITAL_COMMUNITY)
Admission: RE | Admit: 2024-10-26 | Discharge: 2024-10-26 | Disposition: A | Discharge status: Home / Self Care | Attending: Orthopedic Surgery | Admitting: Orthopedic Surgery

## 2024-10-26 ENCOUNTER — Encounter (HOSPITAL_COMMUNITY): Payer: Self-pay | Admitting: Medical

## 2024-10-26 ENCOUNTER — Ambulatory Visit (HOSPITAL_COMMUNITY): Payer: Self-pay | Admitting: Anesthesiology

## 2024-10-26 ENCOUNTER — Ambulatory Visit (HOSPITAL_COMMUNITY)

## 2024-10-26 ENCOUNTER — Encounter (HOSPITAL_COMMUNITY): Payer: Self-pay | Admitting: Orthopedic Surgery

## 2024-10-26 ENCOUNTER — Encounter: Admission: RE | Disposition: A | Payer: Self-pay | Attending: Orthopedic Surgery

## 2024-10-26 DIAGNOSIS — J9601 Acute respiratory failure with hypoxia: Secondary | ICD-10-CM | POA: Diagnosis not present

## 2024-10-26 DIAGNOSIS — M1612 Unilateral primary osteoarthritis, left hip: Secondary | ICD-10-CM | POA: Diagnosis present

## 2024-10-26 DIAGNOSIS — I509 Heart failure, unspecified: Secondary | ICD-10-CM | POA: Insufficient documentation

## 2024-10-26 DIAGNOSIS — M1990 Unspecified osteoarthritis, unspecified site: Secondary | ICD-10-CM | POA: Diagnosis not present

## 2024-10-26 DIAGNOSIS — I252 Old myocardial infarction: Secondary | ICD-10-CM | POA: Diagnosis not present

## 2024-10-26 DIAGNOSIS — G4730 Sleep apnea, unspecified: Secondary | ICD-10-CM | POA: Diagnosis not present

## 2024-10-26 DIAGNOSIS — Z951 Presence of aortocoronary bypass graft: Secondary | ICD-10-CM | POA: Insufficient documentation

## 2024-10-26 DIAGNOSIS — Z87891 Personal history of nicotine dependence: Secondary | ICD-10-CM | POA: Diagnosis not present

## 2024-10-26 DIAGNOSIS — K219 Gastro-esophageal reflux disease without esophagitis: Secondary | ICD-10-CM | POA: Diagnosis not present

## 2024-10-26 DIAGNOSIS — I2510 Atherosclerotic heart disease of native coronary artery without angina pectoris: Secondary | ICD-10-CM

## 2024-10-26 HISTORY — PX: TOTAL HIP ARTHROPLASTY: SHX124

## 2024-10-26 LAB — TYPE AND SCREEN
ABO/RH(D): O POS
Antibody Screen: NEGATIVE

## 2024-10-26 LAB — GLUCOSE, CAPILLARY: Glucose-Capillary: 93 mg/dL (ref 70–99)

## 2024-10-26 SURGERY — ARTHROPLASTY, HIP, TOTAL, ANTERIOR APPROACH
Anesthesia: Monitor Anesthesia Care | Site: Hip | Laterality: Left

## 2024-10-26 MED ORDER — SODIUM CHLORIDE 0.9 % IR SOLN
Status: DC | PRN
  Administered 2024-10-26: 1000 mL

## 2024-10-26 MED ORDER — HYDROMORPHONE HCL 1 MG/ML IJ SOLN
INTRAMUSCULAR | Status: AC
  Filled 2024-10-26: qty 1

## 2024-10-26 MED ORDER — PROPOFOL 1000 MG/100ML IV EMUL
INTRAVENOUS | Status: AC
  Filled 2024-10-26: qty 100

## 2024-10-26 MED ORDER — AMISULPRIDE (ANTIEMETIC) 5 MG/2ML IV SOLN: 10.0000 mg | Freq: Once | INTRAVENOUS | Status: DC | PRN

## 2024-10-26 MED ORDER — PROPOFOL 500 MG/50ML IV EMUL
INTRAVENOUS | Status: DC | PRN
  Administered 2024-10-26: 140 ug/kg/min via INTRAVENOUS

## 2024-10-26 MED ORDER — OXYCODONE HCL 5 MG PO TABS: 5.0000 mg | ORAL_TABLET | ORAL | Status: DC | PRN

## 2024-10-26 MED ORDER — BUPIVACAINE LIPOSOME 1.3 % IJ SUSP
INTRAMUSCULAR | Status: AC
  Filled 2024-10-26: qty 10

## 2024-10-26 MED ORDER — BUPIVACAINE LIPOSOME 1.3 % IJ SUSP: 10.0000 mL | Freq: Once | INTRAMUSCULAR | Status: DC

## 2024-10-26 MED ORDER — PROPOFOL 500 MG/50ML IV EMUL
INTRAVENOUS | Status: AC
  Filled 2024-10-26: qty 50

## 2024-10-26 MED ORDER — OXYCODONE HCL 5 MG PO TABS
5.0000 mg | ORAL_TABLET | Freq: Once | ORAL | Status: AC | PRN
  Administered 2024-10-26: 5 mg via ORAL

## 2024-10-26 MED ORDER — PHENYLEPHRINE HCL-NACL 20-0.9 MG/250ML-% IV SOLN
INTRAVENOUS | Status: DC | PRN
  Administered 2024-10-26: 60 ug/min via INTRAVENOUS

## 2024-10-26 MED ORDER — CHLORHEXIDINE GLUCONATE 0.12 % MT SOLN
15.0000 mL | Freq: Once | OROMUCOSAL | Status: AC
  Administered 2024-10-26: 15 mL via OROMUCOSAL

## 2024-10-26 MED ORDER — HYDROMORPHONE HCL 1 MG/ML IJ SOLN
0.5000 mg | INTRAMUSCULAR | Status: DC | PRN
  Administered 2024-10-26: 1 mg via INTRAVENOUS

## 2024-10-26 MED ORDER — TRANEXAMIC ACID-NACL 1000-0.7 MG/100ML-% IV SOLN
1000.0000 mg | INTRAVENOUS | Status: AC
  Administered 2024-10-26: 1000 mg via INTRAVENOUS
  Filled 2024-10-26: qty 100

## 2024-10-26 MED ORDER — OXYCODONE HCL 5 MG PO TABS
ORAL_TABLET | ORAL | Status: AC
  Filled 2024-10-26: qty 1

## 2024-10-26 MED ORDER — OXYCODONE-ACETAMINOPHEN 5-325 MG PO TABS: 1.0000 | ORAL_TABLET | ORAL | 0 refills | Status: AC | PRN

## 2024-10-26 MED ORDER — CEFAZOLIN SODIUM-DEXTROSE 2-4 GM/100ML-% IV SOLN: 2.0000 g | Freq: Four times a day (QID) | INTRAVENOUS | Status: DC

## 2024-10-26 MED ORDER — POVIDONE-IODINE 10 % EX SWAB
2.0000 | Freq: Once | CUTANEOUS | Status: AC
  Administered 2024-10-26: 2 via TOPICAL

## 2024-10-26 MED ORDER — ONDANSETRON HCL 4 MG/2ML IJ SOLN: 4.0000 mg | Freq: Once | INTRAMUSCULAR | Status: DC | PRN

## 2024-10-26 MED ORDER — METHOCARBAMOL 1000 MG/10ML IJ SOLN: 500.0000 mg | Freq: Four times a day (QID) | INTRAMUSCULAR | Status: DC | PRN

## 2024-10-26 MED ORDER — ACETAMINOPHEN 500 MG PO TABS
1000.0000 mg | ORAL_TABLET | Freq: Once | ORAL | Status: AC
  Administered 2024-10-26: 1000 mg via ORAL
  Filled 2024-10-26: qty 2

## 2024-10-26 MED ORDER — LACTATED RINGERS IV BOLUS
500.0000 mL | Freq: Once | INTRAVENOUS | Status: AC
  Administered 2024-10-26: 500 mL via INTRAVENOUS

## 2024-10-26 MED ORDER — PROPOFOL 10 MG/ML IV BOLUS
INTRAVENOUS | Status: DC | PRN
  Administered 2024-10-26 (×2): 20 mg via INTRAVENOUS

## 2024-10-26 MED ORDER — ONDANSETRON HCL 4 MG/2ML IJ SOLN
INTRAMUSCULAR | Status: DC | PRN
  Administered 2024-10-26: 4 mg via INTRAVENOUS

## 2024-10-26 MED ORDER — ORAL CARE MOUTH RINSE: 15.0000 mL | Freq: Once | OROMUCOSAL | Status: AC

## 2024-10-26 MED ORDER — LACTATED RINGERS IV BOLUS
250.0000 mL | Freq: Once | INTRAVENOUS | Status: AC
  Administered 2024-10-26: 250 mL via INTRAVENOUS

## 2024-10-26 MED ORDER — TRANEXAMIC ACID-NACL 1000-0.7 MG/100ML-% IV SOLN
INTRAVENOUS | Status: AC
  Filled 2024-10-26: qty 100

## 2024-10-26 MED ORDER — TRANEXAMIC ACID-NACL 1000-0.7 MG/100ML-% IV SOLN
1000.0000 mg | Freq: Once | INTRAVENOUS | Status: AC
  Administered 2024-10-26: 1000 mg via INTRAVENOUS

## 2024-10-26 MED ORDER — HYDROMORPHONE HCL 1 MG/ML IJ SOLN
0.2500 mg | INTRAMUSCULAR | Status: DC | PRN
  Administered 2024-10-26 (×2): 0.5 mg via INTRAVENOUS

## 2024-10-26 MED ORDER — CEFAZOLIN SODIUM-DEXTROSE 2-4 GM/100ML-% IV SOLN
2.0000 g | INTRAVENOUS | Status: AC
  Administered 2024-10-26: 2 g via INTRAVENOUS
  Filled 2024-10-26: qty 100

## 2024-10-26 MED ORDER — BUPIVACAINE-EPINEPHRINE (PF) 0.25% -1:200000 IJ SOLN
INTRAMUSCULAR | Status: AC
  Filled 2024-10-26: qty 30

## 2024-10-26 MED ORDER — APIXABAN 2.5 MG PO TABS: 2.5000 mg | ORAL_TABLET | Freq: Two times a day (BID) | ORAL | 0 refills | Status: AC

## 2024-10-26 MED ORDER — LACTATED RINGERS IV SOLN: INTRAVENOUS | Status: DC

## 2024-10-26 MED ORDER — OXYCODONE HCL 5 MG/5ML PO SOLN: 5.0000 mg | Freq: Once | ORAL | Status: AC | PRN

## 2024-10-26 MED ORDER — METHOCARBAMOL 500 MG PO TABS
ORAL_TABLET | ORAL | Status: AC
  Filled 2024-10-26: qty 1

## 2024-10-26 MED ORDER — METHOCARBAMOL 500 MG PO TABS
500.0000 mg | ORAL_TABLET | Freq: Four times a day (QID) | ORAL | Status: DC | PRN
  Administered 2024-10-26: 500 mg via ORAL

## 2024-10-26 MED ORDER — ONDANSETRON HCL 4 MG/2ML IJ SOLN
INTRAMUSCULAR | Status: AC
  Filled 2024-10-26: qty 2

## 2024-10-26 MED ORDER — LACTATED RINGERS IV SOLN: INTRAVENOUS | Status: DC | PRN

## 2024-10-26 MED ORDER — TRAMADOL HCL 50 MG PO TABS: 50.0000 mg | ORAL_TABLET | Freq: Four times a day (QID) | ORAL | Status: DC

## 2024-10-26 MED ORDER — BUPIVACAINE-EPINEPHRINE (PF) 0.25% -1:200000 IJ SOLN
INTRAMUSCULAR | Status: DC | PRN
  Administered 2024-10-26: 40 mL

## 2024-10-26 MED ORDER — TIZANIDINE HCL 2 MG PO TABS: 2.0000 mg | ORAL_TABLET | Freq: Four times a day (QID) | ORAL | 0 refills | Status: AC | PRN

## 2024-10-26 MED ORDER — WATER FOR IRRIGATION, STERILE IR SOLN
Status: DC | PRN
  Administered 2024-10-26: 2000 mL

## 2024-10-26 MED ADMIN — Ketorolac Tromethamine Ophth Soln 0.5%: 1 [drp] | OPHTHALMIC | NDC 61314012605

## 2024-10-26 MED ADMIN — Ophthalmic Irrigation Solution - Intraocular: 15 mL | NDC 00065079515

## 2024-10-26 MED FILL — Ketorolac Tromethamine Ophth Soln 0.5%: 1.0000 [drp] | OPHTHALMIC | Qty: 5 | Status: AC

## 2024-10-26 MED FILL — Ophthalmic Irrigation Solution - Intraocular: 15.0000 mL | INTRAOCULAR | Qty: 15 | Status: AC

## 2024-10-26 SURGICAL SUPPLY — 43 items
BAG COUNTER SPONGE SURGICOUNT (BAG) IMPLANT
BAG ZIPLOCK 12X15 (MISCELLANEOUS) IMPLANT
BENZOIN TINCTURE PRP APPL 2/3 (GAUZE/BANDAGES/DRESSINGS) IMPLANT
BLADE SAW SGTL 18X1.27X75 (BLADE) ×1 IMPLANT
COVER PERINEAL POST (MISCELLANEOUS) ×1 IMPLANT
COVER SURGICAL LIGHT HANDLE (MISCELLANEOUS) ×1 IMPLANT
CUP ACET GRIPTION SERIS 56 100 (Trauma) IMPLANT
DRAPE FOOT SWITCH (DRAPES) ×1 IMPLANT
DRAPE STERI IOBAN 125X83 (DRAPES) ×1 IMPLANT
DRAPE U-SHAPE 47X51 STRL (DRAPES) ×2 IMPLANT
DRESSING AQUACEL AG SP 3.5X10 (GAUZE/BANDAGES/DRESSINGS) IMPLANT
DRSG AQUACEL AG ADV 3.5X 6 (GAUZE/BANDAGES/DRESSINGS) ×1 IMPLANT
DURAPREP 26ML APPLICATOR (WOUND CARE) ×1 IMPLANT
ELECT PENCIL ROCKER SW 15FT (MISCELLANEOUS) ×1 IMPLANT
ELECT REM PT RETURN 15FT ADLT (MISCELLANEOUS) ×1 IMPLANT
ELIMINATOR HOLE APEX DEPUY (Hips) IMPLANT
GAUZE XEROFORM 1X8 LF (GAUZE/BANDAGES/DRESSINGS) IMPLANT
GLOVE BIOGEL PI IND STRL 8 (GLOVE) ×2 IMPLANT
GLOVE BIOGEL PI IND STRL 9 (GLOVE) IMPLANT
GLOVE ECLIPSE 7.5 STRL STRAW (GLOVE) ×2 IMPLANT
GLOVE ECLIPSE 8.5 STRL (GLOVE) IMPLANT
GOWN STRL REUS W/ TWL XL LVL3 (GOWN DISPOSABLE) ×2 IMPLANT
HEAD CERAMIC 36 PLUS 8.5 12 14 (Hips) IMPLANT
HOLDER FOLEY CATH W/STRAP (MISCELLANEOUS) ×1 IMPLANT
HOOD PEEL AWAY T7 (MISCELLANEOUS) ×3 IMPLANT
KIT TURNOVER KIT A (KITS) ×1 IMPLANT
LINER NEUTRAL 52MMX36MMX56N (Liner) IMPLANT
NDL HYPO 22X1.5 SAFETY MO (MISCELLANEOUS) ×2 IMPLANT
NEEDLE HYPO 22X1.5 SAFETY MO (MISCELLANEOUS) ×2 IMPLANT
PACK ANTERIOR HIP CUSTOM (KITS) ×1 IMPLANT
SPIKE FLUID TRANSFER (MISCELLANEOUS) ×1 IMPLANT
STAPLER SKIN PROX 35W (STAPLE) IMPLANT
STEM FEM ACTIS HIGH SZ7 (Stem) IMPLANT
STRIP CLOSURE SKIN 1/2X4 (GAUZE/BANDAGES/DRESSINGS) IMPLANT
SUT ETHIBOND NAB CT1 #1 30IN (SUTURE) ×2 IMPLANT
SUT MNCRL AB 3-0 PS2 18 (SUTURE) IMPLANT
SUT VIC AB 0 CT1 36 (SUTURE) ×1 IMPLANT
SUT VIC AB 1 CT1 36 (SUTURE) ×1 IMPLANT
SUT VIC AB 2-0 CT1 TAPERPNT 27 (SUTURE) ×1 IMPLANT
SUT VICRYL+ 3-0 36IN CT-1 (SUTURE) IMPLANT
TRAY CATH INTERMITTENT SS 16FR (CATHETERS) IMPLANT
TRAY FOLEY MTR SLVR 16FR STAT (SET/KITS/TRAYS/PACK) IMPLANT
TUBE SUCTION HIGH CAP CLEAR NV (SUCTIONS) ×1 IMPLANT

## 2024-10-26 NOTE — Discharge Instructions (Addendum)

## 2024-10-26 NOTE — Interval H&P Note (Signed)
 History and Physical Interval Note:  10/26/2024 8:28 AM  James Torres  has presented today for surgery, with the diagnosis of LEFT HIP OSTEOARTHRITIS.  The various methods of treatment have been discussed with the patient and family. After consideration of risks, benefits and other options for treatment, the patient has consented to  Procedures: ARTHROPLASTY, HIP, TOTAL, ANTERIOR APPROACH (Left) as a surgical intervention.  The patient's history has been reviewed, patient examined, no change in status, stable for surgery.  I have reviewed the patient's chart and labs.  Questions were answered to the patient's satisfaction.     Norleen LITTIE Gavel

## 2024-10-26 NOTE — Anesthesia Postprocedure Evaluation (Signed)
"   Anesthesia Post Note  Patient: James Torres  Procedure(s) Performed: ARTHROPLASTY, HIP, TOTAL, ANTERIOR APPROACH (Left: Hip)     Patient location during evaluation: PACU Anesthesia Type: MAC and Spinal Level of consciousness: awake and alert and oriented Pain management: pain level controlled Vital Signs Assessment: post-procedure vital signs reviewed and stable Respiratory status: spontaneous breathing, nonlabored ventilation and respiratory function stable Cardiovascular status: blood pressure returned to baseline and stable Postop Assessment: no headache, no backache, spinal receding and patient able to bend at knees Anesthetic complications: no   No notable events documented.  Last Vitals:  Vitals:   10/26/24 1245 10/26/24 1256  BP: 99/64 93/64  Pulse: 80 81  Resp: 14   Temp:  (!) 36.4 C  SpO2: 95% 90%    Last Pain:  Vitals:   10/26/24 1256  TempSrc:   PainSc: 0-No pain                 Almarie HERO Niema Carrara      "

## 2024-10-26 NOTE — Transfer of Care (Signed)
 Immediate Anesthesia Transfer of Care Note  Patient: James Torres  Procedure(s) Performed: ARTHROPLASTY, HIP, TOTAL, ANTERIOR APPROACH (Left: Hip)  Patient Location: PACU  Anesthesia Type:MAC and Spinal  Level of Consciousness: awake and alert   Airway & Oxygen Therapy: Patient Spontanous Breathing and Patient connected to face mask oxygen  Post-op Assessment: Report given to RN and Post -op Vital signs reviewed and stable  Post vital signs: Reviewed and stable  Last Vitals:  Vitals Value Taken Time  BP 100/66 10/26/24 11:42  Temp    Pulse 85 10/26/24 11:44  Resp 13 10/26/24 11:44  SpO2 99 % 10/26/24 11:44  Vitals shown include unfiled device data.  Last Pain:  Vitals:   10/26/24 0831  TempSrc: Oral  PainSc:          Complications: No notable events documented.

## 2024-10-26 NOTE — Evaluation (Signed)
 " Physical Therapy Evaluation Patient Details Name: James Torres MRN: [id number] DOB: 1966-10-14 Today's Date: 10/26/2024  History of Present Illness  58 yo male presents to therapy s/p L THA, anterior approach on 10/26/2024 due to failure of conservative measures. Pt is currently L LE WBAT with anterior hip precuations-avoid hyperextension, external rotation and abduction of the hip. Pt PMH includes but is not limited to: arthritis, OSA, MI, seizures, HLD, CAD s/p CABG, dCHF, and brain tumor.  Clinical Impression  James Torres is a 58 y.o. male POD 0 s/p L THA. Patient reports mod I with mobility at baseline. Patient is now limited by functional impairments (see PT problem list below) and requires CGA for transfers and gait with RW. Patient was able to ambulate 55 and 40 feet with RW and min A to CGA with noted B LE instability with LOB and cues for safe walker management. PT later able to observe pt ambulating with nursing staff and much improved stability with gait and use of RW no overt LOB with pt attributing to medications. Pt noted to require increased time for motor processing and planning with cues for redirection and not at baseline again suspect medication. Patient educated on safe sequencing for stair mobility, fall risk prevention, anterior hip precautions--written on HEP, pain management and goal, use of RW vs rollator, car transfers and use of cp/ice pt and spouse verbalized understanding of safe guarding position for people assisting with mobility. Patient instructed in exercises to facilitate ROM and circulation reviewed and HO provided. Patient will benefit from continued skilled PT interventions to address impairments and progress towards PLOF. Patient has met mobility goals at adequate level for discharge home with family support and [facility] services; will continue to follow if pt continues acute stay to progress towards Mod I goals.      If plan is discharge home, recommend the  following: A little help with walking and/or transfers;A little help with bathing/dressing/bathroom;Assistance with cooking/housework;Help with stairs or ramp for entrance;Assist for transportation   Can travel by private vehicle        Equipment Recommendations None recommended by PT  Recommendations for Other Services       Functional Status Assessment Patient has had a recent decline in their functional status and demonstrates the ability to make significant improvements in function in a reasonable and predictable amount of time.     Precautions / Restrictions Precautions Precautions: Fall;Anterior Hip Restrictions Weight Bearing Restrictions Per Provider Order: No      Mobility  Bed Mobility Overal bed mobility: Needs Assistance Bed Mobility: Supine to Sit     Supine to sit: Supervision     General bed mobility comments: min cues    Transfers Overall transfer level: Needs assistance Equipment used: Rolling walker (2 wheels) Transfers: Sit to/from Stand Sit to Stand: Contact guard assist           General transfer comment: min cues    Ambulation/Gait Ambulation/Gait assistance: Contact guard assist, Min assist Gait Distance (Feet): 55 Feet Assistive device: Rolling walker (2 wheels) Gait Pattern/deviations: Step-to pattern, Decreased stance time - left, Staggering left, Staggering right, Trunk flexed, Antalgic Gait velocity: decreased     General Gait Details: slight trunk flexion with B UE support at RW to offload L LE in stance phase, min cues for safety and sequencing with RW management pt noted to have some B LE instabiltiy with gait tasks and attributed to medication, PT later observed pt ambualting with nursing staff with  improved stabiltiy and no overt LOB  Stairs Stairs: Yes Stairs assistance: Min assist Stair Management: Two rails Number of Stairs: 3 General stair comments: pt states having a wall to hold onto and support from window sill close  and attempted to emulate home setting  Wheelchair Mobility     Tilt Bed    Modified Rankin (Stroke Patients Only)       Balance Overall balance assessment: Needs assistance Sitting-balance support: Feet supported Sitting balance-Leahy Scale: Good     Standing balance support: Bilateral upper extremity supported, During functional activity, Reliant on assistive device for balance Standing balance-Leahy Scale: Poor                               Pertinent Vitals/Pain Pain Assessment Pain Assessment: 0-10 Pain Score: 3  Pain Location: L hip and LE Pain Descriptors / Indicators: Aching, Constant, Discomfort, Dull, Grimacing, Operative site guarding Pain Intervention(s): Limited activity within patient's tolerance, Monitored during session, Premedicated before session, Repositioned, Ice applied    Home Living Family/patient expects to be discharged to:: Private residence Living Arrangements: Spouse/significant other Available Help at Discharge: Family Type of Home: House Home Access: Stairs to enter Entrance Stairs-Rails: Left Entrance Stairs-Number of Steps: 3   Home Layout: One level Home Equipment: Rollator (4 wheels);Cane - single point;BSC/3in1;Shower seat      Prior Function Prior Level of Function : Independent/Modified Independent             Mobility Comments: mod I with use of rollator for longer distances no AD in home, IND for all ADLs, sefl care tasks and IADLs       Extremity/Trunk Assessment        Lower Extremity Assessment Lower Extremity Assessment: LLE deficits/detail LLE Deficits / Details: ankle DF/PF 5/5 LLE Sensation: WNL    Cervical / Trunk Assessment Cervical / Trunk Assessment: Normal  Communication   Communication Communication: No apparent difficulties    Cognition Arousal: Alert Behavior During Therapy: WFL for tasks assessed/performed   PT - Cognitive impairments: No apparent impairments                          Following commands: Intact       Cueing       General Comments      Exercises Total Joint Exercises Ankle Circles/Pumps: AROM, Both, 10 reps Quad Sets: AROM, Left, 5 reps Heel Slides: AROM, Left, 5 reps Long Arc Quad: AROM, Left, 5 reps Knee Flexion: AROM, Left, 5 reps Marching in Standing: AROM, Left, 5 reps   Assessment/Plan    PT Assessment Patient needs continued PT services  PT Problem List Decreased strength;Decreased range of motion;Decreased activity tolerance;Decreased balance;Decreased mobility;Decreased coordination;Pain       PT Treatment Interventions DME instruction;Gait training;Stair training;Functional mobility training;Therapeutic activities;Therapeutic exercise;Balance training;Neuromuscular re-education;Patient/family education;Modalities    PT Goals (Current goals can be found in the Care Plan section)  Acute Rehab PT Goals Patient Stated Goal: to be able to do what ever I want PT Goal Formulation: With patient Time For Goal Achievement: 11/09/24 Potential to Achieve Goals: Good    Frequency 7X/week     Co-evaluation               AM-PAC PT 6 Clicks Mobility  Outcome Measure Help needed turning from your back to your side while in a flat bed without using bedrails?: None Help needed moving from  lying on your back to sitting on the side of a flat bed without using bedrails?: A Little Help needed moving to and from a bed to a chair (including a wheelchair)?: A Little Help needed standing up from a chair using your arms (e.g., wheelchair or bedside chair)?: A Little Help needed to walk in hospital room?: A Little Help needed climbing 3-5 steps with a railing? : A Little 6 Click Score: 19    End of Session Equipment Utilized During Treatment: Gait belt Activity Tolerance: Patient tolerated treatment well;No increased pain Patient left: in chair;with call bell/phone within reach;with family/visitor present Nurse  Communication: Mobility status;Other (comment) (pt readiness for same day d/c pending improvement in gait stabiltiy attributed to medicaiton) PT Visit Diagnosis: Unsteadiness on feet (R26.81);Other abnormalities of gait and mobility (R26.89);Muscle weakness (generalized) (M62.81);Difficulty in walking, not elsewhere classified (R26.2);Pain Pain - Right/Left: Left Pain - part of body: Hip;Leg    Time: 1445-1553 PT Time Calculation (min) (ACUTE ONLY): 68 min   Charges:   PT Evaluation $PT Eval Low Complexity: 1 Low PT Treatments $Gait Training: 23-37 mins $Therapeutic Exercise: 8-22 mins $Therapeutic Activity: 8-22 mins PT General Charges $$ ACUTE PT VISIT: 1 Visit         [place], PT Acute Rehab   [place] VEAR Drone 10/26/2024, 7:11 PM "

## 2024-10-26 NOTE — Op Note (Addendum)
 PATIENT ID:      James Torres  MRN:     [id number] DOB/AGE:    60-Feb-1967 / 60 y.o.       OPERATIVE REPORT    DATE OF PROCEDURE:  10/26/2024       PREOPERATIVE DIAGNOSIS:  LEFT HIP OSTEOARTHRITIS                                                       Estimated body mass index is 33.52 kg/m as calculated from the following:   Height as of 10/20/24: 6' (1.829 m).   Weight as of this encounter: 112.1 kg.     POSTOPERATIVE DIAGNOSIS:  LEFT HIP OSTEOARTHRITIS                                                           PROCEDURE:  1.  Left total hip arthroplasty using a 56 mm DePuy GRIPTION Cup, [employer],  neutral liner, a + 8.5 36 mm ceramic head,  and a # 7 high offset Actis stem, 2.interpretation of multiple intraoperative fluoroscopic images   SURGEON: Norleen LITTIE Gavel    ASSISTANT:   Camellia Ellen PA-C  (present throughout entire procedure and necessary for timely completion of the procedure)  ANESTHESIA: Spinal BLOOD LOSS: 300 cc Tranexamic Acid : 1 gram IV DRAINS: None COMPLICATIONS: None    NDICATIONS FOR PROCEDURE:Patient with end-stage arthritis of the left hip.  X-rays show bone-on-bone arthritic changes. Despite conservative measures with observation, anti-inflammatory medicine, narcotics, use of a cane, has severe unremitting pain and can ambulate only less than 1 block before resting.  Patient desires elective left total hip arthroplasty to decrease pain and increase function. The risks, benefits, and alternatives were discussed at length including but not limited to the risks of infection, bleeding, nerve injury, stiffness, blood clots, the need for revision surgery, cardiopulmonary complications, among others, and they were willing to proceed.Benefits have been discussed. Questions answered.     PROCEDURE IN DETAIL: The patient was identified by armband,  received preoperative IV antibiotics in the holding area at [facility], taken to the operating  room , appropriate anesthetic monitors  were attached and spinal anesthesia was induced.  The patient was placed onto the hot bed and all bony prominences were well-padded.The left hip was prepped and draped for an anterior approach to the hip.  An incision was made and the subcutaneous dissection was down to the level of the tensor fascia.  The fascia was opened and finger dissected.  The bleeders coming across the anterior portion of the hip were identified and cauterized. Retractors were put in place above and below the femoral neck.  The capsule was opened and tagged and a provisional neck cut was made.  The head was removed and sized on the back table.  The acetabulum was sequentially reamed to a level of 55 mm and a 56 mm GRIPTION coated pinnacle cup was hammered into place with 45 of lateral opening and 30 of anteversion.fluoroscopy was used to ensure this position of the cup.  Attention was turned towards the femur where the leg was actually  rotated, extended, and adduction did.  The femur was sequentially broached until a size of 7 high offset broach gave a perfect fit and fill.at this point a 8.5 mm delta ceramic hip ball was placed and the hip reduced.  Fluoroscopic images were taken to assess the leg length, fit and fill of the stem, and cup position.  We were happy with the construct at this point.  The 7 broach was removed and a final Actis stem with high offset  and a 8.5 mm ceramic hip ball was placed and reduced.  Final images were taken to make certain there were happy with the position at this point.   The capsule was closed with #1 Vicryl suture.  The tensor fascia was closed with 0 Vicryl suture.  The skin was then closed with combination of 0 and 2-0 Vicryl suture.  The top layer was with 3-0 Monocryl suture.  Benzoin and Steri-Strips were applied  and a sterile compressive dressing was applied and the patient taken to recovery room she noted be in satisfactory condition.  Past medical  Motion for the procedure was approximately 300 cc.  Of note Camellia Ellen was present the entire case and assisted by retraction of tissues, manipulation of the leg, and closing the minimize or time.   Norleen LITTIE Gavel 10/26/2024, 3:42 PM

## 2024-10-27 ENCOUNTER — Encounter (HOSPITAL_COMMUNITY): Payer: Self-pay | Admitting: Orthopedic Surgery

## 2024-11-03 ENCOUNTER — Other Ambulatory Visit (HOSPITAL_BASED_OUTPATIENT_CLINIC_OR_DEPARTMENT_OTHER): Payer: Self-pay

## 2024-11-22 ENCOUNTER — Other Ambulatory Visit (HOSPITAL_BASED_OUTPATIENT_CLINIC_OR_DEPARTMENT_OTHER): Payer: Self-pay | Admitting: Cardiology

## 2024-11-22 DIAGNOSIS — E782 Mixed hyperlipidemia: Secondary | ICD-10-CM

## 2024-12-05 ENCOUNTER — Other Ambulatory Visit (HOSPITAL_BASED_OUTPATIENT_CLINIC_OR_DEPARTMENT_OTHER): Payer: Self-pay

## 2024-12-10 ENCOUNTER — Other Ambulatory Visit (HOSPITAL_BASED_OUTPATIENT_CLINIC_OR_DEPARTMENT_OTHER): Payer: Self-pay
